# Patient Record
Sex: Male | Born: 1975 | Race: Black or African American | Hispanic: No | Marital: Married | Smoking: Never smoker
Health system: Southern US, Community
[De-identification: ages and names within clinical notes are randomized; demographics above are authoritative.]

## PROBLEM LIST (undated history)

## (undated) DIAGNOSIS — D649 Anemia, unspecified: Secondary | ICD-10-CM

## (undated) DIAGNOSIS — M21869 Other specified acquired deformities of unspecified lower leg: Secondary | ICD-10-CM

## (undated) DIAGNOSIS — I509 Heart failure, unspecified: Secondary | ICD-10-CM

## (undated) DIAGNOSIS — I129 Hypertensive chronic kidney disease with stage 1 through stage 4 chronic kidney disease, or unspecified chronic kidney disease: Secondary | ICD-10-CM

## (undated) DIAGNOSIS — N184 Chronic kidney disease, stage 4 (severe): Secondary | ICD-10-CM

## (undated) DIAGNOSIS — M205X9 Other deformities of toe(s) (acquired), unspecified foot: Secondary | ICD-10-CM

## (undated) DIAGNOSIS — I10 Essential (primary) hypertension: Secondary | ICD-10-CM

## (undated) DIAGNOSIS — M21969 Unspecified acquired deformity of unspecified lower leg: Secondary | ICD-10-CM

## (undated) DIAGNOSIS — E119 Type 2 diabetes mellitus without complications: Secondary | ICD-10-CM

## (undated) DIAGNOSIS — R7303 Prediabetes: Secondary | ICD-10-CM

## (undated) DIAGNOSIS — Z8739 Personal history of other diseases of the musculoskeletal system and connective tissue: Secondary | ICD-10-CM

## (undated) DIAGNOSIS — M1990 Unspecified osteoarthritis, unspecified site: Secondary | ICD-10-CM

## (undated) DIAGNOSIS — G43909 Migraine, unspecified, not intractable, without status migrainosus: Secondary | ICD-10-CM

## (undated) HISTORY — DX: Other specified acquired deformities of unspecified lower leg: M21.869

## (undated) HISTORY — DX: Unspecified acquired deformity of unspecified lower leg: M21.969

## (undated) HISTORY — DX: Other deformities of toe(s) (acquired), unspecified foot: M20.5X9

## (undated) HISTORY — PX: WISDOM TOOTH EXTRACTION: SHX21

---

## 1999-07-13 ENCOUNTER — Emergency Department (HOSPITAL_COMMUNITY): Admission: EM | Admit: 1999-07-13 | Discharge: 1999-07-13 | Payer: Self-pay | Admitting: Emergency Medicine

## 2011-09-11 ENCOUNTER — Inpatient Hospital Stay (HOSPITAL_COMMUNITY)
Admission: EM | Admit: 2011-09-11 | Discharge: 2011-09-14 | DRG: 682 | Disposition: A | Discharge status: Home / Self Care | Payer: Managed Care, Other (non HMO) | Attending: Internal Medicine | Admitting: Internal Medicine

## 2011-09-11 ENCOUNTER — Encounter (HOSPITAL_COMMUNITY): Payer: Self-pay | Admitting: Emergency Medicine

## 2011-09-11 ENCOUNTER — Emergency Department (HOSPITAL_COMMUNITY): Payer: Managed Care, Other (non HMO)

## 2011-09-11 DIAGNOSIS — E876 Hypokalemia: Secondary | ICD-10-CM

## 2011-09-11 DIAGNOSIS — N179 Acute kidney failure, unspecified: Secondary | ICD-10-CM | POA: Diagnosis present

## 2011-09-11 DIAGNOSIS — I120 Hypertensive chronic kidney disease with stage 5 chronic kidney disease or end stage renal disease: Secondary | ICD-10-CM

## 2011-09-11 DIAGNOSIS — M948X9 Other specified disorders of cartilage, unspecified sites: Secondary | ICD-10-CM | POA: Diagnosis present

## 2011-09-11 DIAGNOSIS — N183 Chronic kidney disease, stage 3 unspecified: Secondary | ICD-10-CM | POA: Diagnosis present

## 2011-09-11 DIAGNOSIS — I059 Rheumatic mitral valve disease, unspecified: Secondary | ICD-10-CM

## 2011-09-11 DIAGNOSIS — R042 Hemoptysis: Secondary | ICD-10-CM | POA: Diagnosis present

## 2011-09-11 DIAGNOSIS — Z992 Dependence on renal dialysis: Secondary | ICD-10-CM | POA: Diagnosis present

## 2011-09-11 DIAGNOSIS — N186 End stage renal disease: Secondary | ICD-10-CM | POA: Diagnosis present

## 2011-09-11 DIAGNOSIS — I160 Hypertensive urgency: Secondary | ICD-10-CM | POA: Diagnosis present

## 2011-09-11 DIAGNOSIS — I10 Essential (primary) hypertension: Secondary | ICD-10-CM

## 2011-09-11 DIAGNOSIS — D649 Anemia, unspecified: Secondary | ICD-10-CM | POA: Diagnosis present

## 2011-09-11 DIAGNOSIS — Z91199 Patient's noncompliance with other medical treatment and regimen due to unspecified reason: Secondary | ICD-10-CM

## 2011-09-11 DIAGNOSIS — D638 Anemia in other chronic diseases classified elsewhere: Secondary | ICD-10-CM | POA: Diagnosis present

## 2011-09-11 DIAGNOSIS — N184 Chronic kidney disease, stage 4 (severe): Secondary | ICD-10-CM | POA: Diagnosis present

## 2011-09-11 DIAGNOSIS — D509 Iron deficiency anemia, unspecified: Secondary | ICD-10-CM | POA: Diagnosis present

## 2011-09-11 DIAGNOSIS — R112 Nausea with vomiting, unspecified: Secondary | ICD-10-CM

## 2011-09-11 DIAGNOSIS — Z9119 Patient's noncompliance with other medical treatment and regimen: Secondary | ICD-10-CM

## 2011-09-11 DIAGNOSIS — I5043 Acute on chronic combined systolic (congestive) and diastolic (congestive) heart failure: Secondary | ICD-10-CM | POA: Diagnosis present

## 2011-09-11 DIAGNOSIS — I509 Heart failure, unspecified: Secondary | ICD-10-CM

## 2011-09-11 DIAGNOSIS — I129 Hypertensive chronic kidney disease with stage 1 through stage 4 chronic kidney disease, or unspecified chronic kidney disease: Principal | ICD-10-CM | POA: Diagnosis present

## 2011-09-11 DIAGNOSIS — R0602 Shortness of breath: Secondary | ICD-10-CM | POA: Diagnosis present

## 2011-09-11 HISTORY — DX: Hypertensive chronic kidney disease with stage 1 through stage 4 chronic kidney disease, or unspecified chronic kidney disease: I12.9

## 2011-09-11 HISTORY — DX: Essential (primary) hypertension: I10

## 2011-09-11 LAB — CBC
HCT: 36.5 % — ABNORMAL LOW (ref 39.0–52.0)
MCH: 29.5 pg (ref 26.0–34.0)
MCV: 84.9 fL (ref 78.0–100.0)
RBC: 4.3 MIL/uL (ref 4.22–5.81)
RDW: 14.5 % (ref 11.5–15.5)
WBC: 8.2 10*3/uL (ref 4.0–10.5)

## 2011-09-11 LAB — CARDIAC PANEL(CRET KIN+CKTOT+MB+TROPI)
Relative Index: 1.1 (ref 0.0–2.5)
Total CK: 681 U/L — ABNORMAL HIGH (ref 7–232)
Total CK: 489 U/L — ABNORMAL HIGH (ref 7–232)
Troponin I: <0.3 ng/mL (ref <0.30)

## 2011-09-11 LAB — DIFFERENTIAL
Eosinophils Relative: 2 % (ref 0–5)
Lymphocytes Relative: 13 % (ref 12–46)
Lymphs Abs: 1 10*3/uL (ref 0.7–4.0)
Monocytes Absolute: 0.4 10*3/uL (ref 0.1–1.0)

## 2011-09-11 LAB — MRSA PCR SCREENING: MRSA by PCR: NEGATIVE

## 2011-09-11 LAB — POCT I-STAT, CHEM 8
BUN: 21 mg/dL (ref 6–23)
Calcium, Ion: 1.1 mmol/L — ABNORMAL LOW (ref 1.12–1.32)
Chloride: 104 mEq/L (ref 96–112)
Glucose, Bld: 111 mg/dL — ABNORMAL HIGH (ref 70–99)

## 2011-09-11 LAB — URINALYSIS, MICROSCOPIC ONLY
Glucose, UA: NEGATIVE mg/dL
Ketones, ur: NEGATIVE mg/dL
Protein, ur: >300 mg/dL — AB

## 2011-09-11 LAB — MICROALBUMIN / CREATININE URINE RATIO
Creatinine, Urine: 92.9 mg/dL
Microalb, Ur: 129.85 mg/dL — ABNORMAL HIGH (ref 0.00–1.89)

## 2011-09-11 LAB — PROTEIN / CREATININE RATIO, URINE
Protein Creatinine Ratio: 1.2 — ABNORMAL HIGH (ref 0.00–0.15)
Total Protein, Urine: 76.4 mg/dL

## 2011-09-11 LAB — RAPID URINE DRUG SCREEN, HOSP PERFORMED: Benzodiazepines: NOT DETECTED

## 2011-09-11 MED ORDER — ACETAMINOPHEN 325 MG PO TABS: 650.0000 mg | ORAL_TABLET | Freq: Four times a day (QID) | ORAL | Status: DC | PRN

## 2011-09-11 MED ORDER — FUROSEMIDE 10 MG/ML IJ SOLN
80.0000 mg | Freq: Four times a day (QID) | INTRAMUSCULAR | Status: DC
  Administered 2011-09-11 (×2): 80 mg via INTRAVENOUS
  Filled 2011-09-11 (×5): qty 8

## 2011-09-11 MED ORDER — LABETALOL HCL 5 MG/ML IV SOLN: 10.0000 mg | INTRAVENOUS | Status: DC | PRN

## 2011-09-11 MED ORDER — SODIUM CHLORIDE 0.9 % IV SOLN: INTRAVENOUS | Status: DC

## 2011-09-11 MED ORDER — HYDRALAZINE HCL 50 MG PO TABS: 100.0000 mg | ORAL_TABLET | Freq: Four times a day (QID) | ORAL | Status: DC

## 2011-09-11 MED ORDER — SODIUM CHLORIDE 0.9 % IV SOLN
INTRAVENOUS | Status: DC
  Administered 2011-09-11: 13:00:00 via INTRAVENOUS

## 2011-09-11 MED ORDER — ASPIRIN EC 81 MG PO TBEC
81.0000 mg | DELAYED_RELEASE_TABLET | Freq: Every day | ORAL | Status: DC
  Administered 2011-09-11 – 2011-09-14 (×4): 81 mg via ORAL
  Filled 2011-09-11 (×4): qty 1

## 2011-09-11 MED ORDER — ONDANSETRON HCL 4 MG PO TABS: 4.0000 mg | ORAL_TABLET | Freq: Four times a day (QID) | ORAL | Status: DC | PRN

## 2011-09-11 MED ORDER — ASPIRIN 81 MG PO CHEW
CHEWABLE_TABLET | ORAL | Status: AC
  Filled 2011-09-11: qty 1

## 2011-09-11 MED ORDER — SPIRONOLACTONE 50 MG PO TABS
50.0000 mg | ORAL_TABLET | Freq: Two times a day (BID) | ORAL | Status: DC
  Administered 2011-09-11 – 2011-09-14 (×7): 50 mg via ORAL
  Filled 2011-09-11 (×11): qty 1

## 2011-09-11 MED ORDER — HYDRALAZINE HCL 50 MG PO TABS
50.0000 mg | ORAL_TABLET | Freq: Four times a day (QID) | ORAL | Status: DC
  Filled 2011-09-11 (×3): qty 1

## 2011-09-11 MED ORDER — CARVEDILOL 12.5 MG PO TABS
12.5000 mg | ORAL_TABLET | Freq: Two times a day (BID) | ORAL | Status: DC
  Administered 2011-09-11 – 2011-09-12 (×2): 12.5 mg via ORAL
  Filled 2011-09-11 (×4): qty 1

## 2011-09-11 MED ORDER — ACETAMINOPHEN 650 MG RE SUPP: 650.0000 mg | Freq: Four times a day (QID) | RECTAL | Status: DC | PRN

## 2011-09-11 MED ORDER — ONDANSETRON HCL 4 MG/2ML IJ SOLN: 4.0000 mg | Freq: Four times a day (QID) | INTRAMUSCULAR | Status: DC | PRN

## 2011-09-11 MED ORDER — NITROGLYCERIN IN D5W 200-5 MCG/ML-% IV SOLN
5.0000 ug/min | Freq: Once | INTRAVENOUS | Status: AC
  Administered 2011-09-11: 5 ug/min via INTRAVENOUS
  Filled 2011-09-11: qty 250

## 2011-09-11 MED ORDER — LABETALOL HCL 5 MG/ML IV SOLN
20.0000 mg | Freq: Once | INTRAVENOUS | Status: AC
  Administered 2011-09-11: 20 mg via INTRAVENOUS
  Filled 2011-09-11: qty 4

## 2011-09-11 MED ORDER — MORPHINE SULFATE 2 MG/ML IJ SOLN
1.0000 mg | INTRAMUSCULAR | Status: DC | PRN
  Administered 2011-09-11 (×2): 1 mg via INTRAVENOUS
  Filled 2011-09-11 (×2): qty 1

## 2011-09-11 MED ORDER — NITROGLYCERIN IN D5W 200-5 MCG/ML-% IV SOLN
2.0000 ug/min | INTRAVENOUS | Status: DC
  Administered 2011-09-12: 5 ug/min via INTRAVENOUS

## 2011-09-11 MED ORDER — SODIUM CHLORIDE 0.9 % IJ SOLN
3.0000 mL | Freq: Two times a day (BID) | INTRAMUSCULAR | Status: DC
  Administered 2011-09-11 – 2011-09-14 (×7): 3 mL via INTRAVENOUS

## 2011-09-11 MED ORDER — AMLODIPINE BESYLATE 10 MG PO TABS
10.0000 mg | ORAL_TABLET | Freq: Every day | ORAL | Status: DC
  Administered 2011-09-11 – 2011-09-13 (×3): 10 mg via ORAL
  Filled 2011-09-11 (×4): qty 1

## 2011-09-11 MED ORDER — HYDRALAZINE HCL 25 MG PO TABS
25.0000 mg | ORAL_TABLET | Freq: Four times a day (QID) | ORAL | Status: DC
  Administered 2011-09-11 (×2): 25 mg via ORAL
  Filled 2011-09-11 (×7): qty 1

## 2011-09-11 MED ORDER — FUROSEMIDE 80 MG PO TABS
80.0000 mg | ORAL_TABLET | Freq: Two times a day (BID) | ORAL | Status: DC
  Administered 2011-09-11 – 2011-09-12 (×2): 80 mg via ORAL
  Filled 2011-09-11 (×4): qty 1

## 2011-09-11 MED ORDER — HYDRALAZINE HCL 50 MG PO TABS
50.0000 mg | ORAL_TABLET | Freq: Two times a day (BID) | ORAL | Status: DC
  Administered 2011-09-11 – 2011-09-12 (×2): 50 mg via ORAL
  Filled 2011-09-11 (×3): qty 1

## 2011-09-11 MED ORDER — HYDROCODONE-ACETAMINOPHEN 5-325 MG PO TABS
1.0000 | ORAL_TABLET | ORAL | Status: DC | PRN
  Administered 2011-09-11 (×2): 2 via ORAL
  Administered 2011-09-11: 1 via ORAL
  Administered 2011-09-11 – 2011-09-13 (×3): 2 via ORAL
  Filled 2011-09-11 (×4): qty 2
  Filled 2011-09-11: qty 1
  Filled 2011-09-11 (×2): qty 2

## 2011-09-11 MED ORDER — POTASSIUM CHLORIDE CRYS ER 20 MEQ PO TBCR
40.0000 meq | EXTENDED_RELEASE_TABLET | Freq: Once | ORAL | Status: AC
  Administered 2011-09-11: 40 meq via ORAL
  Filled 2011-09-11: qty 2

## 2011-09-11 MED ORDER — POTASSIUM CHLORIDE IN NACL 20-0.45 MEQ/L-% IV SOLN
INTRAVENOUS | Status: DC
  Filled 2011-09-11 (×2): qty 1000

## 2011-09-11 NOTE — ED Provider Notes (Signed)
Medical screening examination/treatment/procedure(s) were performed by non-physician practitioner and as supervising physician I was immediately available for consultation/collaboration.  Lamiya Naas M Avaleigh Decuir, MD 09/11/11 0656 

## 2011-09-11 NOTE — H&P (Signed)
Barry Nichols is an 36 y.o. male.   Chief Complaint: Shortness of Breath HPI: A 36 yo man with long standing history of Hypertension who has not been taking his medications because he believes it is not working, he presented to the ED today with exertional dyspnea and Blood pressure of 205/134. He denies chest pain but has noted that his breathing gets worse with movement and exertion. No dizziness, no Headaches, no fever, no NVD. He has no primary care physician. He was found to have hypokalemia and evidence of Renal insufficiency but denies known history of Chronic kidney disease.  Past Medical History  Diagnosis Date  . Hypertension     History reviewed. No pertinent past surgical history.  History reviewed. No pertinent family history. Social History:  reports that he has quit smoking. He does not have any smokeless tobacco history on file. He reports that he does not drink alcohol or use illicit drugs.  Allergies: No Known Allergies   (Not in a hospital admission)  Results for orders placed during the hospital encounter of 09/11/11 (from the past 48 hour(s))  CBC     Status: Abnormal   Collection Time   09/11/11  3:57 AM      Component Value Range Comment   WBC 8.2  4.0 - 10.5 (K/uL)    RBC 4.30  4.22 - 5.81 (MIL/uL)    Hemoglobin 12.7 (*) 13.0 - 17.0 (g/dL)    HCT 16.1 (*) 09.6 - 52.0 (%)    MCV 84.9  78.0 - 100.0 (fL)    MCH 29.5  26.0 - 34.0 (pg)    MCHC 34.8  30.0 - 36.0 (g/dL)    RDW 04.5  40.9 - 81.1 (%)    Platelets 233  150 - 400 (K/uL)   DIFFERENTIAL     Status: Abnormal   Collection Time   09/11/11  3:57 AM      Component Value Range Comment   Neutrophils Relative 80 (*) 43 - 77 (%)    Neutro Abs 6.6  1.7 - 7.7 (K/uL)    Lymphocytes Relative 13  12 - 46 (%)    Lymphs Abs 1.0  0.7 - 4.0 (K/uL)    Monocytes Relative 4  3 - 12 (%)    Monocytes Absolute 0.4  0.1 - 1.0 (K/uL)    Eosinophils Relative 2  0 - 5 (%)    Eosinophils Absolute 0.2  0.0 - 0.7 (K/uL)      Basophils Relative 0  0 - 1 (%)    Basophils Absolute 0.0  0.0 - 0.1 (K/uL)   PRO B NATRIURETIC PEPTIDE     Status: Abnormal   Collection Time   09/11/11  4:04 AM      Component Value Range Comment   Pro B Natriuretic peptide (BNP) 5642.0 (*) 0 - 125 (pg/mL)   POCT I-STAT, CHEM 8     Status: Abnormal   Collection Time   09/11/11  4:04 AM      Component Value Range Comment   Sodium 143  135 - 145 (mEq/L)    Potassium 3.2 (*) 3.5 - 5.1 (mEq/L)    Chloride 104  96 - 112 (mEq/L)    BUN 21  6 - 23 (mg/dL)    Creatinine, Ser 9.14 (*) 0.50 - 1.35 (mg/dL)    Glucose, Bld 782 (*) 70 - 99 (mg/dL)    Calcium, Ion 9.56 (*) 1.12 - 1.32 (mmol/L)    TCO2 26  0 -  100 (mmol/L)    Hemoglobin 12.6 (*) 13.0 - 17.0 (g/dL)    HCT 03.4 (*) 74.2 - 52.0 (%)   POCT I-STAT TROPONIN I     Status: Normal   Collection Time   09/11/11  5:16 AM      Component Value Range Comment   Troponin i, poc 0.06  0.00 - 0.08 (ng/mL)    Comment 3             Dg Chest 2 View  09/11/2011  *RADIOLOGY REPORT*  Clinical Data: Shortness of breath, cough.  CHEST - 2 VIEW  Comparison: None.  Findings: Cardiomegaly.  Central vascular congestion.  Interstitial prominence.  No pneumothorax.  No pleural effusion.  No acute osseous abnormality.  IMPRESSION: Cardiomegaly with central vascular congestion.  Interstitial prominence may reflect edema or atypical pneumonia.  Original Report Authenticated By: Waneta Martins, M.D.    Review of Systems  Eyes: Positive for blurred vision.  Respiratory: Positive for shortness of breath. Negative for sputum production and wheezing.   Cardiovascular: Negative.   Gastrointestinal: Negative.   Genitourinary: Negative.   Musculoskeletal: Negative.   Skin: Negative.   Neurological: Positive for weakness and headaches.  Endo/Heme/Allergies: Negative.   Psychiatric/Behavioral: Negative.     Blood pressure 215/141, pulse 93, temperature 98.5 F (36.9 C), temperature source Oral, resp. rate  26, SpO2 97.00%. Physical Exam  Constitutional: He is oriented to person, place, and time. He appears well-developed and well-nourished.  HENT:  Head: Normocephalic and atraumatic.  Right Ear: External ear normal.  Left Ear: External ear normal.  Nose: Nose normal.  Mouth/Throat: Oropharynx is clear and moist.  Eyes: Conjunctivae and EOM are normal. Pupils are equal, round, and reactive to light.  Neck: Normal range of motion. Neck supple.  Cardiovascular: Normal rate, regular rhythm, normal heart sounds and intact distal pulses.   Respiratory: Effort normal and breath sounds normal.  GI: Soft. Bowel sounds are normal.  Musculoskeletal: Normal range of motion.  Neurological: He is alert and oriented to person, place, and time. He has normal reflexes.  Skin: Skin is warm and dry.  Psychiatric: He has a normal mood and affect. His behavior is normal. Judgment and thought content normal.     Assessment/Plan A 35 Yo man presenting with HTN urgency due to Non-compliance with treatment. Also Hypokalemia, Renal insufficiency and Anemia probably of chronic disease. Plan#1 Hypertensive Urgency: Patient will be admitted to [facility] unit, start on Nitroglycerin drip,aspirin check Echocardiogram and start oral medications to titrate him off the drip. #2 Hypokalemia: Check Magnesium level and replete. May be Renal loss since no Gi complaints. #3 Renal Insufficiency: Probably chronic from HTN renal disease. Hydrate, follow BUN/Creatinine #4 Anemia of Chronic Disease: Follow H/H closely #5 Medication Non-Compliance: Counseling provided.    January Bergthold,LAWAL 09/11/2011, 5:51 AM

## 2011-09-11 NOTE — ED Notes (Signed)
Pt c/o sob with lying x 3 days. States he had one episode of coughing pink tinged sputum last night. Pt hypertensive. States he is supposed to take medications at home but does not. On monitor. NP at bedside to discuss plan of care

## 2011-09-11 NOTE — ED Notes (Signed)
Attempted to call report but receiving RN is unavailable. 

## 2011-09-11 NOTE — ED Notes (Signed)
Patient with shortness of breath, increasing in the last day, patient states that when he lays down he hears a gurgling and has a dry cough.

## 2011-09-11 NOTE — ED Notes (Signed)
BP remains to be elevated at 200/137. NTG drip was increased to 20 mcg/min. Will continue to monitor

## 2011-09-11 NOTE — ED Notes (Signed)
Dr. Butler Denmark at the bedside to examine the patient.

## 2011-09-11 NOTE — ED Provider Notes (Signed)
History     CSN: [id number]  Arrival date & time 09/11/11  4782   First MD Initiated Contact with Patient 09/11/11 [phone]      Chief Complaint  Patient presents with  . Shortness of Breath    (Consider location/radiation/quality/duration/timing/severity/associated sxs/prior treatment) HPI Comments: Gentleman with a history of hypertension.  He states that he is been on medication in the past without any relief, taking medicine.  Several years ago for the last while he has noticed, that when he lays flat.  He has a gurgling sensation in his chest and feels short of breath.  You to his allergies of note.  He is taking pseudoephedrine 30 mg tablets every 4 hours, which may be contributing to his high blood pressure  Patient is a 36 y.o. male presenting with shortness of breath. The history is provided by the patient.  Shortness of Breath  The current episode started 2 days ago. The onset was gradual. The problem occurs continuously. The problem has been gradually worsening. The problem is moderate. The symptoms are aggravated by activity and a supine position. Associated symptoms include shortness of breath. Pertinent negatives include no chest pain, no chest pressure and no fever.    Past Medical History  Diagnosis Date  . Hypertension     History reviewed. No pertinent past surgical history.  History reviewed. No pertinent family history.  History  Substance Use Topics  . Smoking status: Former Games developer  . Smokeless tobacco: Not on file  . Alcohol Use: No      Review of Systems  Constitutional: Negative for fever.  HENT: Positive for congestion.   Respiratory: Positive for chest tightness and shortness of breath.   Cardiovascular: Negative for chest pain.  Skin: Negative for rash.  Neurological: Negative for dizziness and headaches.    Allergies  Review of patient's allergies indicates no known allergies.  Home Medications   Current Outpatient Rx  Name Route Sig  Dispense Refill  . CETIRIZINE HCL 10 MG PO TABS Oral Take 10 mg by mouth daily.    Marland Kitchen PRESCRIPTION MEDICATION Inhalation Inhale 2 puffs into the lungs daily as needed. inhaler    . PSEUDOEPHEDRINE HCL 30 MG PO TABS Oral Take 30 mg by mouth every 4 (four) hours as needed. For allergies      BP 226/138  Pulse 54  Temp(Src) 98.5 F (36.9 C) (Oral)  Resp 24  SpO2 98%  Physical Exam  Constitutional: He is oriented to person, place, and time. He appears well-developed and well-nourished.  HENT:  Head: Normocephalic.  Eyes: Pupils are equal, round, and reactive to light.  Neck: Normal range of motion.  Cardiovascular: Normal rate and regular rhythm.   Pulmonary/Chest: Effort normal. No respiratory distress. He has wheezes. He exhibits no tenderness.  Abdominal: He exhibits no distension.  Musculoskeletal: Normal range of motion. He exhibits no edema.  Neurological: He is alert and oriented to person, place, and time.  Skin: Skin is warm and dry.    ED Course  Procedures (including critical care time)  Labs Reviewed  CBC - Abnormal; Notable for the following:    Hemoglobin 12.7 (*)    HCT 36.5 (*)    All other components within normal limits  DIFFERENTIAL - Abnormal; Notable for the following:    Neutrophils Relative 80 (*)    All other components within normal limits  PRO B NATRIURETIC PEPTIDE - Abnormal; Notable for the following:    Pro B Natriuretic peptide (BNP) 5642.0 (*)  All other components within normal limits  POCT I-STAT, CHEM 8 - Abnormal; Notable for the following:    Potassium 3.2 (*)    Creatinine, Ser 2.70 (*)    Glucose, Bld 111 (*)    Calcium, Ion 1.10 (*)    Hemoglobin 12.6 (*)    HCT 37.0 (*)    All other components within normal limits   Dg Chest 2 View  09/11/2011  *RADIOLOGY REPORT*  Clinical Data: Shortness of breath, cough.  CHEST - 2 VIEW  Comparison: None.  Findings: Cardiomegaly.  Central vascular congestion.  Interstitial prominence.  No  pneumothorax.  No pleural effusion.  No acute osseous abnormality.  IMPRESSION: Cardiomegaly with central vascular congestion.  Interstitial prominence may reflect edema or atypical pneumonia.  Original Report Authenticated By: Waneta Martins, M.D.     No diagnosis found.  ED ECG REPORT   Date: 09/11/2011  EKG Time: 5:10 AM  Rate: 109  Rhythm: sinus tachycardia,  , there are no previous tracings available for comparison  Axis: normal  Intervals:none  ST&T Change: St an dT wave abnormal   Narrative Interpretation: abnormal            MDM          Arman Filter, NP 09/11/11 [phone]

## 2011-09-11 NOTE — Progress Notes (Signed)
[facility]  Pt admitted near 6 AM.  Pt evaluated and chart reviewed. Clearly has pulmonary edema from chronic HTN and likely diastolic dysfunction. Have started Lasix, Hydralazine and Aldactone. BP is improving and he has diuresed about 1.5 liters. F/u ECHO, KCL later today and cardiac enzymes.  Appreciate nephro f/u.   Calvert Cantor, MD [phone]

## 2011-09-11 NOTE — Consult Note (Signed)
[facility] - CONSULT NOTE Resident Note    Please see below for attending addendum to resident note.   Date: 09/11/2011                  Patient Name:  Barry Nichols  MRN: [id number]  DOB: [birth year]  Age / Sex: 36 y.o., male         PCP: Pcp Not In System                 Referring Physician: Dr. Butler Denmark                 Reason for Consult: Hypertensive emergency            History of Present Illness: Patient is a 36 y.o. male with a PMHx of long standing history of uncontrolled HTN, transient severe HTN in 2009 and 2012 ( DBP>160), medication noncompliance ( has not been taking his medications for last 1 month because he believes that they do not work), who presented to [facility] on 09/11/2011 with hemoptysis and SOB.  He states that he started coughing up some blood last night- small amounts with phlegm which persisted in the morning and he decided to come to the ER. Denies noticing any clots. He also reports having SOB for last 2 weeks that is worse with laying flat. Denies any dyspnea on exertion to [place] but was documented in the ED note. Denies any chest pain, wheezing, dizziness, blurry vision, weakness , numbness or leg swelling. Also denies any N/V/D.  Of note he stopped taking his BP meds fro last 1 month when he came back from a trip to [place] as his BP was running in 150/90 by his report.  In the ER he was found to have BP of 250/165 and Cr- 2.7, K- 3.2. Patient was unable to tell his baseline Cr and therefore renal was consulted for his BP management and to help providing records eluding his baseline renal function.   He was initially evaluated at [facility] in April 2012 because of severe hypertension and a creatinine one 2.5 - 2.6.  (He had been seen prior to that by Dr. Armanda Magic who had initiated a workup for secondary hypertension that the patient never completed, other than a renal artery duplex that showed no evidence for renal artery stenosis).   Evaluation at [facility] included renal function as mentioned, 24 hour creatinine clearance of 70.7 ml/min, around 600 mg protein, negative serologies (ANCA, ANA, SPEP, UPEP, ANA and complements that were completely negative or normal)  He did return for followup on at least 2 occasions with his last visit in 01/13 when his BP was 180/110 with Cr of 2.38. His BP was noted in 160's diastolics in 03/12 and was 220/110 in 04/12.  He never took his BP meds prior to his office visits and did not bring in his monitor for review of results to any visit.  His wife states they did stop using salt for cooking.  He was recently taking sudafed for allergies.  He is not using cocaine. He had no BP meds for a month PTA  Medications: Outpatient medications: Prescriptions prior to admission  Medication Sig Dispense Refill  . cetirizine (ZYRTEC) 10 MG tablet Take 10 mg by mouth daily.      Marland Kitchen PRESCRIPTION MEDICATION Inhale 2 puffs into the lungs daily as needed. inhaler      . pseudoephedrine (SUDAFED) 30 MG tablet Take 30 mg by mouth every  4 (four) hours as needed. For allergies        Current medications: Current Facility-Administered Medications  Medication Dose Route Frequency Provider Last Rate Last Dose  . 0.9 %  sodium chloride infusion   Intravenous Continuous Calvert Cantor, MD      . acetaminophen (TYLENOL) tablet 650 mg  650 mg Oral Q6H PRN Rometta Emery, MD       Or  . acetaminophen (TYLENOL) suppository 650 mg  650 mg Rectal Q6H PRN Rometta Emery, MD      . aspirin EC tablet 81 mg  81 mg Oral Daily Rometta Emery, MD      . furosemide (LASIX) injection 80 mg  80 mg Intravenous Q6H Calvert Cantor, MD   80 mg at 09/11/11 0830  . hydrALAZINE (APRESOLINE) tablet 25 mg  25 mg Oral Q6H Russella Dar, NP   25 mg at 09/11/11 0804  . HYDROcodone-acetaminophen (NORCO) 5-325 MG per tablet 1-2 tablet  1-2 tablet Oral Q4H PRN Rometta Emery, MD   2 tablet at 09/11/11 1306  . labetalol (NORMODYNE,TRANDATE)  injection 20 mg  20 mg Intravenous Once Arman Filter, NP   20 mg at 09/11/11 0455  . labetalol (NORMODYNE,TRANDATE) injection 20 mg  20 mg Intravenous Once Olivia Mackie, MD   20 mg at 09/11/11 0548  . morphine 2 MG/ML injection 1 mg  1 mg Intravenous Q4H PRN Rometta Emery, MD   1 mg at 09/11/11 1306  . nitroGLYCERIN 0.2 mg/mL in dextrose 5 % infusion  5 mcg/min Intravenous Once Arman Filter, NP 9 mL/hr at 09/11/11 1203 30 mcg/min at 09/11/11 1203  . nitroGLYCERIN 0.2 mg/mL in dextrose 5 % infusion  2-200 mcg/min Intravenous Titrated Rometta Emery, MD 12 mL/hr at 09/11/11 1305 40 mcg/min at 09/11/11 1305  . ondansetron (ZOFRAN) tablet 4 mg  4 mg Oral Q6H PRN Rometta Emery, MD       Or  . ondansetron (ZOFRAN) injection 4 mg  4 mg Intravenous Q6H PRN Rometta Emery, MD      . potassium chloride SA (K-DUR,KLOR-CON) CR tablet 40 mEq  40 mEq Oral Once Arman Filter, NP   40 mEq at 09/11/11 0515  . sodium chloride 0.9 % injection 3 mL  3 mL Intravenous Q12H Rometta Emery, MD      . spironolactone (ALDACTONE) tablet 50 mg  50 mg Oral BID Calvert Cantor, MD   50 mg at 09/11/11 0901  . DISCONTD: 0.45 % NaCl with KCl 20 mEq / L infusion   Intravenous Continuous Rometta Emery, MD      . DISCONTD: aspirin 81 MG chewable tablet             Allergies: No Known Allergies   Past Medical History: Past Medical History  Diagnosis Date  . Hypertension    Past Surgical History: History reviewed. No pertinent past surgical history.  Family History: Positive for hypertension, diabetes, MI, lung cancer.  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 rate 18, height 5\' 10"  (1.778 m), weight 236 lb 8.9 oz (107.3 kg), SpO2 95.00%.  Weight trends: Filed Weights    09/11/11 1300  Weight: 236 lb 8.9 oz (107.3 kg)  Physical Exam: General: Vital signs reviewed and noted. Well-developed, well-nourished, in no acute distress; alert, appropriate and cooperative throughout examination.  Head: Normocephalic, atraumatic.  Eyes: PERRL, EOMI, No signs of anemia or jaundince.  Nose: Mucous membranes moist, not inflammed, nonerythematous.  Throat: Oropharynx nonerythematous, no exudate appreciated.   Neck: No deformities, masses, or tenderness noted.Supple, No carotid Bruits, no JVD.  Lungs:  Normal respiratory effort. Clear to auscultation BL with bilateral base crackles.   Heart: RRR. S1 and S2 normal without gallop, murmur, or rubs.  Abdomen:  BS normoactive. Soft, Nondistended, non-tender.  No masses or organomegaly.  Extremities: 1+ pretibial edema.  Neurologic: A&O X3, CN II - XII are grossly intact. Motor strength is 5/5 in the all 4 extremities, Sensations intact to light touch, Cerebellar signs negative.  Skin: No visible rashes, scars. Tattoos on back/arms    Lab results: Basic Metabolic Panel:  Lab 09/11/11 4540  NA 143  K 3.2*  CL 104  CO2 --  GLUCOSE 111*  BUN 21  CREATININE 2.70*  CALCIUM --  MG --  PHOS --   CBC:  Lab 09/11/11 0404 09/11/11 0357  WBC -- 8.2  NEUTROABS -- 6.6  HGB 12.6* 12.7*  HCT 37.0* 36.5*  MCV -- 84.9  PLT -- 233     Urinalysis:  Basename 09/11/11 0843  COLORURINE YELLOW  LABSPEC 1.013  PHURINE 7.0  GLUCOSEU NEGATIVE  HGBUR SMALL*  BILIRUBINUR NEGATIVE  KETONESUR NEGATIVE  PROTEINUR >300*  UROBILINOGEN 0.2  NITRITE NEGATIVE  LEUKOCYTESUR NEGATIVE      Imaging: Dg Chest 2 View  09/11/2011  *RADIOLOGY REPORT*  Clinical Data: Shortness of breath, cough.  CHEST - 2 VIEW  Comparison: None.  Findings: Cardiomegaly.  Central vascular congestion.  Interstitial prominence.  No pneumothorax.  No pleural effusion.  No acute osseous abnormality.  IMPRESSION: Cardiomegaly with central vascular  congestion.  Interstitial prominence may reflect edema or atypical pneumonia.  Original Report Authenticated By: Waneta Martins, M.D.     Renal ultrasound in 03/12: R kidney measures- 10 cm, left-9.1cm. No renal artery stenosis. Some increased in echogenicity of the renal parenchyma.   Echocardiogram in (?)2012 -  [facility].  EKG- Normal sinus rhythm, tachycardia, ST-T- wave changes in leads I, aVL, II,III, aVF, V4, V5 and  V6, [facility].  Assessment & Plan: Pt is a 37 y.o. yo male with a PMHX of long standing of poorly  HTN in the setting of non - compliance , was admitted to [facility] on 09/11/2011 with SOB and hemotysis. He has long standing history of uncontrolled HTN and presented with BP of 250 /165 and Cr of 2.7. He follows up with Dr. Eliott Nine as an outpatient and renal was consulted to assist in the management of his Hypertensive emergency and assist in getting records to know his baseline Cr.   1.Hypertensive emergency: BP on admission was 250/150 with flash pulmonary edema on CXR and inferolateral ST - T changes on his EKG with some acute on chronic renal failure in the setting of medication non compliance. His Cr is 2.7 with baseline ~2.5 and POC troponin's have been negative We reviewed medical records from [facility]- Dr. Elza Rafter office notes and he does have severe hypertension. Records reveal BP ranging 220-260/110-140 at baseline (with occasional lower values per patient's report).He has a history of medication non-compliance since his BP did not improve as medications were being titrated up; therefore, he just stopped taking all of medications completely about 1 month ago.  He had renal duplex in 06/2010 which did not any renal artery stenosis; however, his kidneys were small for his size, 10 & 9cm right and left respectively and he had a 24 hr ccr of 70 with about 600 mg proteinuria.t was thought that renal bx would be low yield.  Patient was supposed to get work-up labs for pheo and  other secondary causes; however, he never came for his appointment with Dr. Myrna Blazer PCP. Urinalysis today shows >300 protein, no RBC. So we would rule out secondary causes for his HTN like phaeochromocytoma, hyperaldosteronism.  -Recommend not to lower his BP rapidly not over 25% of his admission BP in 1st 24 hours to avoid CVAs, especially THIS IS close to his baseline BP (would keep BP close to 190/100), decrease Lasix dose. -Will check VMA, 24 h urine fractionated metanephrines, plasma metanepherines, dopamine, aldosterone/renin  , urinary free cortisol  -Will repeat protein/cr (would not be surprised to see increased proteinuria in the settingof accelerated HTN - Will need to find a regimen for him that is relatively streamlined (no more than BID meds if possible) and affordable - as this has been issue with compliance in past - Will change Lasix to 80 PO bid, add carvedilol and amlodipine, change hydralazine to BID - creatinine will probably rise as BP is lowered  2. 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 His Cr  was 2.38 in 01/13. He presents with Cr of 2.7 today. Suspect this is progression of his CKD in the setting of poorly controlled HTN. He has UOP of 1.8 litres so far. UA shows proteinuria, small amount of blood and no RBC. Likely will worsen with BP control    3. Anemia : He presented with Hb- 12.6. He had Hb- 15 in 01/13. Continue to monitor CBC.  4. Hypokalemia : He presents with potassium of 3.2. Etiology like related to high renin state of accelerated HTN (has never been hypokalemic in the past) but will check Mg, renin- aldosterone to rule out secondary causes of HTN.  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  6.Dispo- Continue to monitor in ICU.  Patient history and plan of care reviewed with attending, Dr. Sharlee Blew, MD  PGYII, Internal Medicine Resident 09/11/2011, 2:11 PM I have seen and examined this patient and agree with plan as  outlined above.  36 yo well known to me with (probable) essential hypertension - never well controlled - with evidence of end organ damage (LVH, cardiomegaly, retinopathy, CKD) who presents with accelerated HTN after being off all meds for a month.  Plan for medication changes and workup as outlined.   Derika Eckles B,MD 09/11/2011 4:49 PM

## 2011-09-12 ENCOUNTER — Encounter (HOSPITAL_COMMUNITY): Payer: Self-pay | Admitting: Nephrology

## 2011-09-12 DIAGNOSIS — E876 Hypokalemia: Secondary | ICD-10-CM

## 2011-09-12 DIAGNOSIS — I5021 Acute systolic (congestive) heart failure: Secondary | ICD-10-CM

## 2011-09-12 DIAGNOSIS — I120 Hypertensive chronic kidney disease with stage 5 chronic kidney disease or end stage renal disease: Secondary | ICD-10-CM

## 2011-09-12 DIAGNOSIS — I10 Essential (primary) hypertension: Secondary | ICD-10-CM

## 2011-09-12 LAB — COMPREHENSIVE METABOLIC PANEL
ALT: 20 U/L (ref 0–53)
Alkaline Phosphatase: 77 U/L (ref 39–117)
BUN: 23 mg/dL (ref 6–23)
Chloride: 100 mEq/L (ref 96–112)
GFR calc Af Amer: 33 mL/min — ABNORMAL LOW (ref >90)
Glucose, Bld: 102 mg/dL — ABNORMAL HIGH (ref 70–99)
Potassium: 3 mEq/L — ABNORMAL LOW (ref 3.5–5.1)
Sodium: 138 mEq/L (ref 135–145)
Total Bilirubin: 0.7 mg/dL (ref 0.3–1.2)

## 2011-09-12 LAB — CBC
MCHC: 34.8 g/dL (ref 30.0–36.0)
MCV: 85.1 fL (ref 78.0–100.0)
Platelets: 238 10*3/uL (ref 150–400)
RDW: 14.3 % (ref 11.5–15.5)
WBC: 8.8 10*3/uL (ref 4.0–10.5)

## 2011-09-12 MED ORDER — POTASSIUM CHLORIDE CRYS ER 20 MEQ PO TBCR
40.0000 meq | EXTENDED_RELEASE_TABLET | Freq: Every day | ORAL | Status: DC
  Administered 2011-09-12 – 2011-09-14 (×3): 40 meq via ORAL
  Filled 2011-09-12 (×3): qty 2

## 2011-09-12 MED ORDER — CARVEDILOL 25 MG PO TABS
25.0000 mg | ORAL_TABLET | Freq: Two times a day (BID) | ORAL | Status: DC
  Administered 2011-09-12 – 2011-09-14 (×4): 25 mg via ORAL
  Filled 2011-09-12 (×6): qty 1

## 2011-09-12 MED ORDER — HYDRALAZINE HCL 50 MG PO TABS: 50.0000 mg | ORAL_TABLET | Freq: Four times a day (QID) | ORAL | Status: DC

## 2011-09-12 MED ORDER — CARVEDILOL 12.5 MG PO TABS
12.5000 mg | ORAL_TABLET | ORAL | Status: AC
  Administered 2011-09-12: 12.5 mg via ORAL
  Filled 2011-09-12: qty 1

## 2011-09-12 MED ORDER — HYDRALAZINE HCL 50 MG PO TABS
50.0000 mg | ORAL_TABLET | Freq: Three times a day (TID) | ORAL | Status: DC
  Filled 2011-09-12 (×3): qty 1

## 2011-09-12 MED ORDER — POTASSIUM CHLORIDE CRYS ER 20 MEQ PO TBCR
40.0000 meq | EXTENDED_RELEASE_TABLET | Freq: Four times a day (QID) | ORAL | Status: AC
  Administered 2011-09-12 (×2): 40 meq via ORAL
  Filled 2011-09-12 (×2): qty 2

## 2011-09-12 MED ORDER — FUROSEMIDE 40 MG PO TABS
40.0000 mg | ORAL_TABLET | Freq: Two times a day (BID) | ORAL | Status: DC
  Administered 2011-09-12 – 2011-09-14 (×4): 40 mg via ORAL
  Filled 2011-09-12 (×6): qty 1

## 2011-09-12 MED ORDER — HYDRALAZINE HCL 50 MG PO TABS
50.0000 mg | ORAL_TABLET | Freq: Two times a day (BID) | ORAL | Status: DC
  Administered 2011-09-12 – 2011-09-13 (×3): 50 mg via ORAL
  Filled 2011-09-12 (×4): qty 1

## 2011-09-12 MED ORDER — POTASSIUM CHLORIDE CRYS ER 20 MEQ PO TBCR: 40.0000 meq | EXTENDED_RELEASE_TABLET | Freq: Four times a day (QID) | ORAL | Status: DC

## 2011-09-12 MED ORDER — NITROGLYCERIN 0.4 MG/HR TD PT24
0.4000 mg | MEDICATED_PATCH | Freq: Every day | TRANSDERMAL | Status: DC
  Administered 2011-09-12 – 2011-09-14 (×3): 0.4 mg via TRANSDERMAL
  Filled 2011-09-12 (×3): qty 1

## 2011-09-12 NOTE — Discharge Summary (Signed)
[facility]   Consultants: Nephrology  Subjective: No longer dyspenic upon laying flat and no longer coughing up blood. He feels well. I have discussed the ECHO with him.   Objective: Blood pressure 161/99, pulse 88, temperature 97.8 F (36.6 C), temperature source Oral, resp. rate 20, height 5\' 10"  (1.778 m), weight 107.3 kg (236 lb 8.9 oz), SpO2 93.00%. Weight change:   Intake/Output Summary (Last 24 hours) at 09/12/11 1247 Last data filed at 09/12/11 1610  Gross per 24 hour  Intake 1097.5 ml  Output   1250 ml  Net -152.5 ml    Physical Exam: General appearance: alert, cooperative and no distress Throat: lips, mucosa, and tongue normal; teeth and gums normal Lungs: clear to auscultation bilaterally Heart: regular rate and rhythm, S1, S2 normal, no murmur, click, rub or gallop Abdomen: soft, non-tender; bowel sounds normal; no masses,  no organomegaly Extremities: extremities normal, atraumatic, no cyanosis or edema  Lab Results:  Basename 09/12/11 0600 09/11/11 1723 09/11/11 0404  NA 138 -- 143  K 3.0* -- 3.2*  CL 100 -- 104  CO2 26 -- --  GLUCOSE 102* -- 111*  BUN 23 -- 21  CREATININE 2.76* -- 2.70*  CALCIUM 8.6 -- --  MG -- 2.1 --  PHOS -- -- --    Basename 09/12/11 0600  AST 22  ALT 20  ALKPHOS 77  BILITOT 0.7  PROT 6.1  ALBUMIN 2.8*   No results found for this basename: LIPASE:2,AMYLASE:2 in the last 72 hours  Basename 09/12/11 0600 09/11/11 0404 09/11/11 0357  WBC 8.8 -- 8.2  NEUTROABS -- -- 6.6  HGB 11.3* 12.6* --  HCT 32.5* 37.0* --  MCV 85.1 -- 84.9  PLT 238 -- 233    Basename 09/12/11 0600 09/11/11 2049 09/11/11 1311  CKTOTAL 350* 489* 681*  CKMB 4.3* 5.3* 7.5*  CKMBINDEX -- -- --  TROPONINI <0.30 <0.30 <0.30   No components found with this basename: POCBNP:3 No results found for this basename: DDIMER:2 in the last 72 hours No results found for this basename: HGBA1C:2 in the last 72 hours No results found for this basename:  CHOL:2,HDL:2,LDLCALC:2,TRIG:2,CHOLHDL:2,LDLDIRECT:2 in the last 72 hours  Basename 09/11/11 1311  TSH 1.623  T4TOTAL --  T3FREE --  THYROIDAB --   No results found for this basename: VITAMINB12:2,FOLATE:2,FERRITIN:2,TIBC:2,IRON:2,RETICCTPCT:2 in the last 72 hours  Micro Results: Recent Results (from the past 240 hour(s))  MRSA PCR SCREENING     Status: Normal   Collection Time   09/11/11 12:59 PM      Component Value Range Status Comment   MRSA by PCR NEGATIVE  NEGATIVE  Final     Studies/Results: Dg Chest 2 View  09/11/2011  *RADIOLOGY REPORT*  Clinical Data: Shortness of breath, cough.  CHEST - 2 VIEW  Comparison: None.  Findings: Cardiomegaly.  Central vascular congestion.  Interstitial prominence.  No pneumothorax.  No pleural effusion.  No acute osseous abnormality.  IMPRESSION: Cardiomegaly with central vascular congestion.  Interstitial prominence may reflect edema or atypical pneumonia.  Original Report Authenticated By: Waneta Martins, M.D.    Medications: Scheduled Meds:   . amLODipine  10 mg Oral QHS  . aspirin EC  81 mg Oral Daily  . carvedilol  12.5 mg Oral NOW  . carvedilol  25 mg Oral BID WC  . furosemide  40 mg Oral BID  . hydrALAZINE  50 mg Oral Q12H  . nitroGLYCERIN  0.4 mg Transdermal Daily  . potassium chloride  40 mEq  Oral Daily  . potassium chloride  40 mEq Oral Q6H  . sodium chloride  3 mL Intravenous Q12H  . spironolactone  50 mg Oral BID  . DISCONTD: carvedilol  12.5 mg Oral BID WC  . DISCONTD: furosemide  80 mg Intravenous Q6H  . DISCONTD: furosemide  80 mg Oral BID  . DISCONTD: hydrALAZINE  100 mg Oral Q6H  . DISCONTD: hydrALAZINE  25 mg Oral Q6H  . DISCONTD: hydrALAZINE  50 mg Oral Q6H  . DISCONTD: hydrALAZINE  50 mg Oral Q12H  . DISCONTD: hydrALAZINE  50 mg Oral Q6H  . DISCONTD: hydrALAZINE  50 mg Oral Q8H  . DISCONTD: potassium chloride  40 mEq Oral Q6H   Continuous Infusions:   . sodium chloride    . nitroGLYCERIN 5 mcg/min  (09/12/11 1610)  . 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 Add Nitro patch and titrate off on Nitro drip.  He has had renal dopplers in the past which were negative for RAS  Acute on chronic systolic and diastolic CHF Resolved. Will decrease Lasix to 40 BID. Will likely need it chronically.   Renal failure Chronic and likely related to uncontrolled HTN   Anemia Likely of chronic disease-    Hypokalemia replacing  Code Status:Full  [facility] [phone] 09/12/2011, 12:47 PM  LOS: 1 day

## 2011-09-12 NOTE — Progress Notes (Signed)
[facility] - PROGRESS NOTE Resident Note   Please see below for attending addendum to resident note.  Subjective:   He reports feeling better- denies having cough or hemoptysis (  Likely was pinkish frothy sputum from pulmonary edema). " I want to go home". He was off NTG for few hours at night but had to restarted this AM with BP of 183/110. Currently at 3mcg/min.  Objective:    Vital Signs:   Temp:  [97.9 F (36.6 C)-98.5 F (36.9 C)] 98.1 F (36.7 C) (05/25 0400) Pulse Rate:  [83-96] 92  (05/24 1300) Resp:  [18-31] 20  (05/24 1600) BP: (148-216)/(29-136) 183/110 mmHg (05/25 0600) SpO2:  [91 %-100 %] 93 % (05/25 0400) Weight:  [236 lb 8.9 oz (107.3 kg)] 236 lb 8.9 oz (107.3 kg) (05/24 1300) Last BM Date: 09/11/11  24-hour weight change: Weight change:   Weight trends: Filed Weights   09/11/11 1300  Weight: 236 lb 8.9 oz (107.3 kg)    Intake/Output:  05/24 0701 - 05/25 0700 In: 1097.5 [P.O.:720; I.V.:361.5; IV Piggyback:16] Out: 2700 [Urine:2700]  Physical Exam: General: Vital signs reviewed and noted. Well-developed, well-nourished, in no acute distress; alert, appropriate and cooperative throughout examination.  Lungs:  Normal respiratory effort. Clear to auscultation BL   Heart: RRR. S1 and S2 normal without gallop, murmur, or rubs.  Abdomen:  BS normoactive. Soft, Nondistended, non-tender.  No masses or organomegaly.  Extremities: Trace pretibial edema.     Labs: Basic Metabolic Panel:  Lab 09/12/11 9562 09/11/11 1723 09/11/11 0404  NA 138 -- 143  K 3.0* -- 3.2*  CL 100 -- 104  CO2 26 -- --  GLUCOSE 102* -- 111*  BUN 23 -- 21  CREATININE 2.76* -- 2.70*  CALCIUM 8.6 -- --  MG -- 2.1 --  PHOS -- -- --    Liver Function Tests:  Lab 09/12/11 0600  AST 22  ALT 20  ALKPHOS 77  BILITOT 0.7  PROT 6.1  ALBUMIN 2.8*    CBC:  Lab 09/12/11 0600 09/11/11 0404 09/11/11 0357  WBC 8.8 -- 8.2  NEUTROABS -- -- 6.6  HGB 11.3* 12.6* 12.7*   HCT 32.5* 37.0* 36.5*  MCV 85.1 -- 84.9  PLT 238 -- 233    Cardiac Enzymes:  Lab 09/12/11 0600 09/11/11 2049 09/11/11 1311  CKTOTAL 350* 489* 681*  CKMB 4.3* 5.3* 7.5*  CKMBINDEX -- -- --  TROPONINI <0.30 <0.30 <0.30    Microbiology: Results for orders placed during the hospital encounter of 09/11/11  MRSA PCR SCREENING     Status: Normal   Collection Time   09/11/11 12:59 PM      Component Value Range Status Comment   MRSA by PCR NEGATIVE  NEGATIVE  Final      Urinalysis:  Basename 09/11/11 0843  COLORURINE YELLOW  LABSPEC 1.013  PHURINE 7.0  GLUCOSEU NEGATIVE  HGBUR SMALL*  BILIRUBINUR NEGATIVE  KETONESUR NEGATIVE  PROTEINUR >300*  UROBILINOGEN 0.2  NITRITE NEGATIVE  LEUKOCYTESUR NEGATIVE      Imaging: Dg Chest 2 View  09/11/2011  *RADIOLOGY REPORT*  Clinical Data: Shortness of breath, cough.  CHEST - 2 VIEW  Comparison: None.  Findings: Cardiomegaly.  Central vascular congestion.  Interstitial prominence.  No pneumothorax.  No pleural effusion.  No acute osseous abnormality.  IMPRESSION: Cardiomegaly with central vascular congestion.  Interstitial prominence may reflect edema or atypical pneumonia.  Original Report Authenticated By: Waneta Martins, M.D.      Medications:  Infusions:    . sodium chloride    . nitroGLYCERIN 5 mcg/min (09/12/11 1610)  . DISCONTD: 0.45 % NaCl with KCl 20 mEq / L    . DISCONTD: sodium chloride 10 mL/hr at 09/11/11 1245    Scheduled Medications:    . amLODipine  10 mg Oral QHS  . aspirin EC  81 mg Oral Daily  . carvedilol  12.5 mg Oral BID WC  . furosemide  80 mg Oral BID  . hydrALAZINE  50 mg Oral Q12H  . sodium chloride  3 mL Intravenous Q12H  . spironolactone  50 mg Oral BID  . DISCONTD: furosemide  80 mg Intravenous Q6H  . DISCONTD: hydrALAZINE  100 mg Oral Q6H  . DISCONTD: hydrALAZINE  25 mg Oral Q6H  . DISCONTD: hydrALAZINE  50 mg Oral Q6H    PRN Medications: acetaminophen, acetaminophen,  HYDROcodone-acetaminophen, labetalol, morphine injection, ondansetron (ZOFRAN) IV, ondansetron   Assessment/ Plan:    Pt is a 36 y.o. yo male with a PMHX of long standing of poorly HTN in the setting of non - compliance , was admitted to [facility] on 09/11/2011 with SOB and hemotysis. He has long standing history of uncontrolled HTN and presented with BP of 250 /165 and Cr of 2.7. He follows up with Dr. Eliott Nine as an outpatient and renal was consulted to assist in the management of his Hypertensive emergency and assist in getting records to know his baseline Cr.   1.Hypertensive emergency: BP on admission was 250/150 with flash pulmonary edema on CXR and inferolateral ST - T changes on his EKG. Creatinine is at baseline (2.5-2.7).  Renal ultrasound showed small kidneys from poorly controlled HTN about a year ago. Renal doppler was negative for RAS in 2012 -Titrate Nitro gtt. Recommend not to lower his BP rapidly not over 25% of his admission BP in  24 - 48 hrs to avoid CVAs, especially THIS IS close to his baseline BP (would keep BP close to 160-180's systolic's with diastolic's in 100-110's for today). - VMA, 24 h urine fractionated metanephrines, plasma metanepherines, dopamine, aldosterone/renin , urinary free cortisol pending. to rule out secondary causes of HTN. - Change Lasix to 40 PO bid. Continue  Carvedilol 25 bid , amlodipine 10/d (max) and  hydralazine 50 mg BID (max)  2. Progressive Chronic Renal failure stage 3: from poorly controlled HTN. Baseline Cr~2.5. His Cr was 2.38 in 01/13. He presents with Cr of 2.7 today. Suspect this is progression of his CKD in the setting of poorly controlled HTN. He has UOP of 2.7 litres since yesterday.  UA shows proteinuria, small amount of blood and no RBC.  3. Anemia : He presented with Hb- 12.6. His Hb is 11 today. He had Hb- 15 in 01/13. Continue to monitor CBC.  4. Hypokalemia : He presents with potassium of 3.2. Etiology like related to high renin state  of accelerated HTN (has never been hypokalemic in the past) . 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   6.Dispo- Continue to monitor in ICU    Length of Stay: 1 days  Patient history and plan of care reviewed with attending, Dr. Eliott Nine.   Elyse Jarvis, MD  PGYII, Internal Medicine Resident 09/12/2011, 7:40 AM  Patient seen and examined and agree with assessment and plan as above. Started back on po BP meds and hopefully this cocktail will work.  We are avoiding ACEI/ARB in the acute phase is case there is some renal injury acutely from malignant HTN, though these agents may be of benefit in the future.  Additional options are clonidine, minoxidil, alpha-blockers.  My next preference would be minoxidil, but would give him a couple more days on the current regimen to stabilize first.  I told him he would likely need to be here in the hospital several more days- not sure he wanted to hear that.  Vinson Moselle  MD [facility] [phone] pgr    [phone] cell 09/12/2011, 12:44 PM

## 2011-09-13 DIAGNOSIS — I5021 Acute systolic (congestive) heart failure: Secondary | ICD-10-CM

## 2011-09-13 DIAGNOSIS — E876 Hypokalemia: Secondary | ICD-10-CM

## 2011-09-13 DIAGNOSIS — I120 Hypertensive chronic kidney disease with stage 5 chronic kidney disease or end stage renal disease: Secondary | ICD-10-CM

## 2011-09-13 DIAGNOSIS — I10 Essential (primary) hypertension: Secondary | ICD-10-CM

## 2011-09-13 LAB — FOLATE: Folate: 7.7 ng/mL

## 2011-09-13 LAB — BASIC METABOLIC PANEL
BUN: 27 mg/dL — ABNORMAL HIGH (ref 6–23)
Chloride: 102 mEq/L (ref 96–112)
Creatinine, Ser: 2.6 mg/dL — ABNORMAL HIGH (ref 0.50–1.35)
GFR calc Af Amer: 35 mL/min — ABNORMAL LOW (ref >90)
GFR calc non Af Amer: 30 mL/min — ABNORMAL LOW (ref >90)
Potassium: 4 mEq/L (ref 3.5–5.1)

## 2011-09-13 LAB — CBC
HCT: 34.2 % — ABNORMAL LOW (ref 39.0–52.0)
MCHC: 34.2 g/dL (ref 30.0–36.0)
Platelets: 260 10*3/uL (ref 150–400)
RDW: 14.1 % (ref 11.5–15.5)
WBC: 7.1 10*3/uL (ref 4.0–10.5)

## 2011-09-13 LAB — VITAMIN B12: Vitamin B-12: 291 pg/mL (ref 211–911)

## 2011-09-13 LAB — IRON AND TIBC
Iron: 27 ug/dL — ABNORMAL LOW (ref 42–135)
UIBC: 252 ug/dL (ref 125–400)

## 2011-09-13 LAB — RETICULOCYTES
RBC.: 4.03 MIL/uL — ABNORMAL LOW (ref 4.22–5.81)
Retic Count, Absolute: 100.8 10*3/uL (ref 19.0–186.0)

## 2011-09-13 MED ORDER — HYDRALAZINE HCL 50 MG PO TABS
50.0000 mg | ORAL_TABLET | Freq: Three times a day (TID) | ORAL | Status: DC
  Administered 2011-09-13 – 2011-09-14 (×2): 50 mg via ORAL
  Filled 2011-09-13 (×5): qty 1

## 2011-09-13 MED ORDER — SODIUM CHLORIDE 0.9 % IV SOLN
250.0000 mg | Freq: Every day | INTRAVENOUS | Status: DC
  Administered 2011-09-13: 250 mg via INTRAVENOUS
  Filled 2011-09-13 (×3): qty 20

## 2011-09-13 MED ORDER — CLONIDINE HCL 0.2 MG/24HR TD PTWK
0.2000 mg | MEDICATED_PATCH | TRANSDERMAL | Status: DC
  Administered 2011-09-13: 0.2 mg via TRANSDERMAL
  Filled 2011-09-13: qty 1

## 2011-09-13 NOTE — Discharge Summary (Deleted)
[facility]   Consultants: Nephrology  Subjective: Feels good. No complaints.   Objective: Blood pressure 172/109, pulse 79, temperature 98.6 F (37 C), temperature source Oral, resp. rate 19, height 5\' 10"  (1.778 m), weight 107.3 kg (236 lb 8.9 oz), SpO2 97.00%. Weight change:   Intake/Output Summary (Last 24 hours) at 09/13/11 1549 Last data filed at 09/13/11 1500  Gross per 24 hour  Intake   1282 ml  Output   2875 ml  Net  -1593 ml    Physical Exam: General appearance: alert, cooperative and no distress Throat: lips, mucosa, and tongue normal; teeth and gums normal Lungs: clear to auscultation bilaterally Heart: regular rate and rhythm, S1, S2 normal, no murmur, click, rub or gallop Abdomen: soft, non-tender; bowel sounds normal; no masses,  no organomegaly Extremities: extremities normal, atraumatic, no cyanosis or edema  Lab Results:  Basename 09/13/11 0821 09/12/11 0600 09/11/11 1723  NA 137 138 --  K 4.0 3.0* --  CL 102 100 --  CO2 25 26 --  GLUCOSE 121* 102* --  BUN 27* 23 --  CREATININE 2.60* 2.76* --  CALCIUM 8.9 8.6 --  MG -- -- 2.1  PHOS -- -- --    Basename 09/12/11 0600  AST 22  ALT 20  ALKPHOS 77  BILITOT 0.7  PROT 6.1  ALBUMIN 2.8*   No results found for this basename: LIPASE:2,AMYLASE:2 in the last 72 hours  Basename 09/13/11 0821 09/12/11 0600 09/11/11 0357  WBC 7.1 8.8 --  NEUTROABS -- -- 6.6  HGB 11.7* 11.3* --  HCT 34.2* 32.5* --  MCV 84.9 85.1 --  PLT 260 238 --    Basename 09/12/11 0600 09/11/11 2049 09/11/11 1311  CKTOTAL 350* 489* 681*  CKMB 4.3* 5.3* 7.5*  CKMBINDEX -- -- --  TROPONINI <0.30 <0.30 <0.30   No components found with this basename: POCBNP:3 No results found for this basename: DDIMER:2 in the last 72 hours No results found for this basename: HGBA1C:2 in the last 72 hours No results found for this basename: CHOL:2,HDL:2,LDLCALC:2,TRIG:2,CHOLHDL:2,LDLDIRECT:2 in the last 72 hours  Basename 09/11/11  1311  TSH 1.623  T4TOTAL --  T3FREE --  THYROIDAB --    Basename 09/13/11 0821  VITAMINB12 291  FOLATE 7.7  FERRITIN 167  TIBC 279  IRON 27*  RETICCTPCT 2.5    Micro Results: Recent Results (from the past 240 hour(s))  MRSA PCR SCREENING     Status: Normal   Collection Time   09/11/11 12:59 PM      Component Value Range Status Comment   MRSA by PCR NEGATIVE  NEGATIVE  Final     Studies/Results: Dg Chest 2 View  09/11/2011  *RADIOLOGY REPORT*  Clinical Data: Shortness of breath, cough.  CHEST - 2 VIEW  Comparison: None.  Findings: Cardiomegaly.  Central vascular congestion.  Interstitial prominence.  No pneumothorax.  No pleural effusion.  No acute osseous abnormality.  IMPRESSION: Cardiomegaly with central vascular congestion.  Interstitial prominence may reflect edema or atypical pneumonia.  Original Report Authenticated By: Waneta Martins, M.D.    Medications: Scheduled Meds:    . amLODipine  10 mg Oral QHS  . aspirin EC  81 mg Oral Daily  . carvedilol  25 mg Oral BID WC  . cloNIDine  0.2 mg Transdermal Weekly  . furosemide  40 mg Oral BID  . hydrALAZINE  50 mg Oral Q8H  . nitroGLYCERIN  0.4 mg Transdermal Daily  . potassium chloride  40 mEq Oral Daily  .  potassium chloride  40 mEq Oral Q6H  . sodium chloride  3 mL Intravenous Q12H  . spironolactone  50 mg Oral BID  . DISCONTD: hydrALAZINE  50 mg Oral Q12H   Continuous Infusions:    . sodium chloride Stopped (09/13/11 0900)  . 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  Will not d/c home today  as planned earlier.  Will increase Hydralazine to 50 TID and add Clonidine patch TTS-2 today  Acute on chronic systolic and diastolic CHF- EF 96-04% with  significant LVH Resolved. Will decrease Lasix to 40 BID. Will likely need it chronically.   Renal failure Chronic and likely related to uncontrolled HTN   Anemia  chronic disease and iron defiency per Iron paned obtained today. Would give him a couple doses of IV iron and then start oral replacement. Check stool occults.    Hypokalemia Replaced- follow carefully as he is on aldactone and KCL.   Code Status:Full Dispo: will need to f/u with Dr Eliott Nine as Eustace Moore [phone] 09/13/2011, 3:49 PM  LOS: 2 days

## 2011-09-13 NOTE — Progress Notes (Addendum)
[facility] - PROGRESS NOTE Resident Note   Please see below for attending addendum to resident note.  Subjective:   2D echocardiogram: 35-40% EF, restrictive physiology, severe LVH, peak PA pressure . He reports doing well this AM, denies any chest pain or SOB or orthopnea/PND.  Making good urine output ~2.1L in past 24 hours.   He has been off of nitro gtt since 9PM (when his BP dropped to 140/90's) and currently has nitro patch on.  BP ranging from 140-180 systolic and 79-127 diastolic with average 160/100's.  Again asking when he can go home?  K 3-->4 with repletion Cr 2.76-->2.6 Hb 11.7  Objective:    Vital Signs:   Temp:  [97.8 F (36.6 C)-98.9 F (37.2 C)] 98.9 F (37.2 C) (05/26 0405) Pulse Rate:  [84-93] 89  (05/25 1600) Resp:  [16-19] 16  (05/26 0000) BP: (140-188)/(79-127) 167/106 mmHg (05/26 0600) SpO2:  [94 %-97 %] 97 % (05/26 0405) Last BM Date: 09/11/11  24-hour weight change: Weight change:   Weight trends: Filed Weights   09/11/11 1300  Weight: 236 lb 8.9 oz (107.3 kg)    Intake/Output:  05/25 0701 - 05/26 0700 In: 1254 [P.O.:1000; I.V.:254] Out: 2175 [Urine:2175]  General:  Vital signs reviewed and noted. Well-developed, well-nourished, in no acute distress; alert, appropriate and cooperative throughout examination.   Neck: No deformities, masses, or tenderness noted.Supple, No carotid Bruits, no JVD.  Lungs: Normal respiratory effort. Clear to auscultation BL with faint bilateral base at crackles.  Heart: RRR. S1 and S2 normal without gallop, murmur, or rubs.  Abdomen: BS normoactive. Soft, Nondistended, non-tender. No masses or organomegaly.  Extremities: trace pretibial edema.  Neurologic: nonfocal  Skin: No visible rashes,Tattoos on back/arms   Labs: Basic Metabolic Panel:  Lab 09/12/11 4098 09/11/11 1723 09/11/11 0404  NA 138 -- 143  K 3.0* -- 3.2*  CL 100 -- 104  CO2 26 -- --  GLUCOSE 102* -- 111*  BUN 23 -- 21    CREATININE 2.76* -- 2.70*  CALCIUM 8.6 -- --  MG -- 2.1 --  PHOS -- -- --   Liver Function Tests:  Lab 09/12/11 0600  AST 22  ALT 20  ALKPHOS 77  BILITOT 0.7  PROT 6.1  ALBUMIN 2.8*   CBC:  Lab 09/12/11 0600 09/11/11 0404 09/11/11 0357  WBC 8.8 -- 8.2  NEUTROABS -- -- 6.6  HGB 11.3* 12.6* 12.7*  HCT 32.5* 37.0* 36.5*  MCV 85.1 -- 84.9  PLT 238 -- 233   Cardiac Enzymes:  Lab 09/12/11 0600 09/11/11 2049 09/11/11 1311  CKTOTAL 350* 489* 681*  CKMB 4.3* 5.3* 7.5*  CKMBINDEX -- -- --  TROPONINI <0.30 <0.30 <0.30     Medications:    Infusions:    . sodium chloride    . nitroGLYCERIN Stopped (09/12/11 2100)    Scheduled Medications:    . amLODipine  10 mg Oral QHS  . aspirin EC  81 mg Oral Daily  . carvedilol  12.5 mg Oral NOW  . carvedilol  25 mg Oral BID WC  . furosemide  40 mg Oral BID  . hydrALAZINE  50 mg Oral Q12H  . nitroGLYCERIN  0.4 mg Transdermal Daily  . potassium chloride  40 mEq Oral Daily  . potassium chloride  40 mEq Oral Q6H  . sodium chloride  3 mL Intravenous Q12H  . spironolactone  50 mg Oral BID  . DISCONTD: carvedilol  12.5 mg Oral BID WC  . DISCONTD: furosemide  80 mg Oral BID  . DISCONTD: hydrALAZINE  50 mg Oral Q12H  . DISCONTD: hydrALAZINE  50 mg Oral Q6H  . DISCONTD: hydrALAZINE  50 mg Oral Q8H  . DISCONTD: potassium chloride  40 mEq Oral Q6H    PRN Medications: acetaminophen, acetaminophen, HYDROcodone-acetaminophen, labetalol, morphine injection, ondansetron (ZOFRAN) IV, ondansetron   Assessment/ Plan:   Pt is a 36 y.o. yo male with a PMHX of long standing of poorly HTN in the setting of non - compliance , was admitted to [facility] on 09/11/2011 with SOB and hemotysis. He has long standing history of uncontrolled HTN and presented with BP of 250 /165 and Cr of 2.7. He follows up with Dr. Eliott Nine as an outpatient and renal was consulted to assist in the management of his Hypertensive emergency and assist in getting records to know  his baseline Cr.   1.Hypertensive emergency: BP on admission was 250/150 with pulmonary edema on CXR and inferolateral ST - T changes on his EKG. Creatinine is at baseline (2.5-2.7). Renal ultrasound showed small kidneys from poorly controlled HTN about a year ago. Renal doppler was negative for RAS in 2012. Neg UDS -BP ranging from 140-180 systolic and 79-127 diastolic.  Improving slowly. -Off Nitro gtt.  Goal would be 150-160/90's today - VMA, 24 h urine fractionated metanephrines, plasma metanepherines,  aldosterone/renin , urinary free cortisol to rule out secondary causes of HTN: pending - Continue Lasix to 40 PO bid, Carvedilol 25 bid , amlodipine 10/d (max) and hydralazine 50 mg BID (max), Aldactone 50 mg bid  2. Progressive Chronic Renal failure stage 3: from poorly controlled HTN. Baseline Cr~2.5. His Cr was 2.38 in 01/13. He presents with Cr of 2.7-->2.76-->2.6. He has UOP of 2.1 liters in past 24 hrs. UA shows proteinuria, small amount of blood and no RBC.  3. Anemia : likely of chronic disease. He presented with Hb- 12.6-->11-->11.7. He had Hb- 15 in 01/13. Continue to monitor CBC. Will check anemia panel  4. Hypokalemia : 3.2-->3-->4. Etiology like related to high renin state of accelerated HTN (has never been hypokalemic in the past) and diuresis with Lasix yesterday. 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   6. Systolic Heart Failure: newly diagnosed.  EF 35-40%, severe LVH, restrictive physiology.  Continue Lasix 40mg  bid  7.Dispo- transfer to telemetry  Length of Stay: 2 days  Patient history and plan of care reviewed with attending, Dr. Arlean Hopping.  Mathis Dad, MD  PGYII, Internal Medicine Resident 09/13/2011, 6:49 AM  Patient seen and examined and agree with assessment and plan as above.  BP slowly improving, down to 160/90's mostly.  He is on 3 BP meds, 2 diuretics and po KCl.  OK for discharge from a renal standpoint, renal function is stable and BP is stabilizing.  Will call office on Tuesday to schedule follow up.  Will sign off, please call as needed.  Vinson Moselle  MD [facility] [phone] pgr    [phone] cell 09/13/2011, 12:54 PM

## 2011-09-13 NOTE — Progress Notes (Signed)
[facility]   Consultants: Nephrology  Subjective: Feels good. No complaints.   Objective: Blood pressure 184/124, pulse 79, temperature 98.6 F (37 C), temperature source Oral, resp. rate 19, height 5\' 10"  (1.778 m), weight 107.3 kg (236 lb 8.9 oz), SpO2 97.00%. Weight change:   Intake/Output Summary (Last 24 hours) at 09/13/11 1743 Last data filed at 09/13/11 1500  Gross per 24 hour  Intake   1259 ml  Output   2475 ml  Net  -1216 ml    Physical Exam: General appearance: alert, cooperative and no distress Throat: lips, mucosa, and tongue normal; teeth and gums normal Lungs: clear to auscultation bilaterally Heart: regular rate and rhythm, S1, S2 normal, no murmur, click, rub or gallop Abdomen: soft, non-tender; bowel sounds normal; no masses,  no organomegaly Extremities: extremities normal, atraumatic, no cyanosis or edema  Lab Results:  Basename 09/13/11 0821 09/12/11 0600 09/11/11 1723  NA 137 138 --  K 4.0 3.0* --  CL 102 100 --  CO2 25 26 --  GLUCOSE 121* 102* --  BUN 27* 23 --  CREATININE 2.60* 2.76* --  CALCIUM 8.9 8.6 --  MG -- -- 2.1  PHOS -- -- --    Basename 09/12/11 0600  AST 22  ALT 20  ALKPHOS 77  BILITOT 0.7  PROT 6.1  ALBUMIN 2.8*   No results found for this basename: LIPASE:2,AMYLASE:2 in the last 72 hours  Basename 09/13/11 0821 09/12/11 0600 09/11/11 0357  WBC 7.1 8.8 --  NEUTROABS -- -- 6.6  HGB 11.7* 11.3* --  HCT 34.2* 32.5* --  MCV 84.9 85.1 --  PLT 260 238 --    Basename 09/12/11 0600 09/11/11 2049 09/11/11 1311  CKTOTAL 350* 489* 681*  CKMB 4.3* 5.3* 7.5*  CKMBINDEX -- -- --  TROPONINI <0.30 <0.30 <0.30   No components found with this basename: POCBNP:3 No results found for this basename: DDIMER:2 in the last 72 hours No results found for this basename: HGBA1C:2 in the last 72 hours No results found for this basename: CHOL:2,HDL:2,LDLCALC:2,TRIG:2,CHOLHDL:2,LDLDIRECT:2 in the last 72 hours  Basename 09/11/11  1311  TSH 1.623  T4TOTAL --  T3FREE --  THYROIDAB --    Basename 09/13/11 0821  VITAMINB12 291  FOLATE 7.7  FERRITIN 167  TIBC 279  IRON 27*  RETICCTPCT 2.5    Micro Results: Recent Results (from the past 240 hour(s))  MRSA PCR SCREENING     Status: Normal   Collection Time   09/11/11 12:59 PM      Component Value Range Status Comment   MRSA by PCR NEGATIVE  NEGATIVE  Final     Studies/Results: Dg Chest 2 View  09/11/2011  *RADIOLOGY REPORT*  Clinical Data: Shortness of breath, cough.  CHEST - 2 VIEW  Comparison: None.  Findings: Cardiomegaly.  Central vascular congestion.  Interstitial prominence.  No pneumothorax.  No pleural effusion.  No acute osseous abnormality.  IMPRESSION: Cardiomegaly with central vascular congestion.  Interstitial prominence may reflect edema or atypical pneumonia.  Original Report Authenticated By: Waneta Martins, M.D.    Medications: Scheduled Meds:    . amLODipine  10 mg Oral QHS  . aspirin EC  81 mg Oral Daily  . carvedilol  25 mg Oral BID WC  . cloNIDine  0.2 mg Transdermal Weekly  . ferric gluconate (FERRLECIT/NULECIT) IV  250 mg Intravenous Daily  . furosemide  40 mg Oral BID  . hydrALAZINE  50 mg Oral Q8H  . nitroGLYCERIN  0.4 mg  Transdermal Daily  . potassium chloride  40 mEq Oral Daily  . potassium chloride  40 mEq Oral Q6H  . sodium chloride  3 mL Intravenous Q12H  . spironolactone  50 mg Oral BID  . DISCONTD: hydrALAZINE  50 mg Oral Q12H   Continuous Infusions:    . DISCONTD: sodium chloride Stopped (09/13/11 0900)  . 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  Will not d/c home today  as planned earlier.  Will increase Hydralazine to 50 TID and add  Clonidine patch TTS-2 today  Acute on chronic systolic and diastolic CHF- EF 16-10% with significant LVH Resolved. Will decrease Lasix to 40 BID. Will likely need it chronically.   Renal failure Chronic and likely related to uncontrolled HTN   Anemia  chronic disease and iron defiency per Iron paned obtained today. Would give him a couple doses of IV iron and then start oral replacement. Check stool occults.    Hypokalemia Replaced- follow carefully as he is on aldactone and KCL.   Code Status:Full Dispo: will need to f/u with Dr Eliott Nine as Eustace Moore [phone] 09/13/2011, 5:43 PM  LOS: 2 days

## 2011-09-14 DIAGNOSIS — E876 Hypokalemia: Secondary | ICD-10-CM

## 2011-09-14 DIAGNOSIS — I5021 Acute systolic (congestive) heart failure: Secondary | ICD-10-CM

## 2011-09-14 DIAGNOSIS — I10 Essential (primary) hypertension: Secondary | ICD-10-CM

## 2011-09-14 DIAGNOSIS — I120 Hypertensive chronic kidney disease with stage 5 chronic kidney disease or end stage renal disease: Secondary | ICD-10-CM

## 2011-09-14 LAB — BASIC METABOLIC PANEL
BUN: 30 mg/dL — ABNORMAL HIGH (ref 6–23)
CO2: 25 mEq/L (ref 19–32)
Chloride: 101 mEq/L (ref 96–112)
GFR calc non Af Amer: 29 mL/min — ABNORMAL LOW (ref >90)
Glucose, Bld: 124 mg/dL — ABNORMAL HIGH (ref 70–99)
Potassium: 4.1 mEq/L (ref 3.5–5.1)
Sodium: 135 mEq/L (ref 135–145)

## 2011-09-14 LAB — CBC
HCT: 34.4 % — ABNORMAL LOW (ref 39.0–52.0)
Hemoglobin: 11.7 g/dL — ABNORMAL LOW (ref 13.0–17.0)
RBC: 4.05 MIL/uL — ABNORMAL LOW (ref 4.22–5.81)
WBC: 6.8 10*3/uL (ref 4.0–10.5)

## 2011-09-14 MED ORDER — FUROSEMIDE 40 MG PO TABS: 40.0000 mg | ORAL_TABLET | Freq: Two times a day (BID) | ORAL | Status: DC

## 2011-09-14 MED ORDER — HYDRALAZINE HCL 50 MG PO TABS: 50.0000 mg | ORAL_TABLET | Freq: Three times a day (TID) | ORAL | Status: DC

## 2011-09-14 MED ORDER — CARVEDILOL 25 MG PO TABS: 25.0000 mg | ORAL_TABLET | Freq: Two times a day (BID) | ORAL | Status: DC

## 2011-09-14 MED ORDER — CYANOCOBALAMIN 1000 MCG/ML IJ SOLN
1000.0000 ug | Freq: Once | INTRAMUSCULAR | Status: AC
  Administered 2011-09-14: 1000 ug via SUBCUTANEOUS
  Filled 2011-09-14: qty 1

## 2011-09-14 MED ORDER — SPIRONOLACTONE 50 MG PO TABS: 50.0000 mg | ORAL_TABLET | Freq: Two times a day (BID) | ORAL | Status: DC

## 2011-09-14 MED ORDER — FERROUS SULFATE 324 (65 FE) MG PO TBEC: 1.0000 | DELAYED_RELEASE_TABLET | Freq: Two times a day (BID) | ORAL | Status: DC

## 2011-09-14 MED ORDER — CLONIDINE HCL 0.2 MG PO TABS: 0.2000 mg | ORAL_TABLET | Freq: Three times a day (TID) | ORAL | Status: DC

## 2011-09-14 MED ORDER — ASPIRIN 81 MG PO TBEC: 81.0000 mg | DELAYED_RELEASE_TABLET | Freq: Every day | ORAL | Status: DC

## 2011-09-14 MED ORDER — AMLODIPINE BESYLATE 10 MG PO TABS: 10.0000 mg | ORAL_TABLET | Freq: Every day | ORAL | Status: DC

## 2011-09-14 NOTE — Progress Notes (Signed)
All d/c instructions were reviewed with pt along with new medicines, prescriptions, low sodium diet instructions and arterial hypertension. Pt/wife verbalized understanding of all instructions given and agree to comply with medication regimen. IV removed, papers signed. Pt refused wheelchair assistance and requested that he be able to walk out. Pt was in no distress walking about the room and he was escorted by his wife to the car.

## 2011-09-14 NOTE — Progress Notes (Addendum)
[facility] - PROGRESS NOTE Resident Note   Please see below for attending addendum to resident note.  Subjective:   He states that he feels  "wonderful" and wants to go home.  BPs range 130-180/70-120's.  Hydralazine 50mg  was increased to TID & Clonidine 0.2mg  patch was added yesterday. Cr remains stable at 2.7, K is 4.1. Urine output is 2.5L   Objective:    Vital Signs:   Temp:  [97.6 F (36.4 C)-98.6 F (37 C)] 97.6 F (36.4 C) (05/26 2350) Pulse Rate:  [77-87] 87  (05/26 1500) Resp:  [16-19] 16  (05/27 0530) BP: (136-184)/(70-124) 162/107 mmHg (05/27 0533) SpO2:  [96 %-99 %] 96 % (05/27 0530) Last BM Date: 09/13/11  24-hour weight change: Weight change:   Weight trends: Filed Weights   09/11/11 1300  Weight: 236 lb 8.9 oz (107.3 kg)   Intake/Output:  05/26 0701 - 05/27 0700 In: 1453 [P.O.:1440; I.V.:13] Out: 2550 [Urine:2550]  Physical Exam: General:  Vital signs reviewed and noted. Well-developed, well-nourished, in no acute distress; alert, appropriate and cooperative throughout examination.  Neck: No deformities, masses, or tenderness noted. Supple, No carotid Bruits, no JVD.  Lungs: Normal respiratory effort. Clear to auscultation BL  Heart: RRR. S1 and S2 normal without gallop, murmur, or rubs.  Abdomen: BS normoactive. Soft, Nondistended, non-tender. No masses or organomegaly.  Extremities: no edema.  Neurologic: nonfocal  Skin: No visible rashes,Tattoos on back/arms   Labs: Basic Metabolic Panel:  Lab 09/14/11 4540 09/13/11 0821 09/12/11 0600 09/11/11 1723 09/11/11 0404  NA 135 137 138 -- 143  K 4.1 4.0 3.0* -- 3.2*  CL 101 102 100 -- 104  CO2 25 25 26  -- --  GLUCOSE 124* 121* 102* -- 111*  BUN 30* 27* 23 -- 21  CREATININE 2.71* 2.60* 2.76* -- 2.70*  CALCIUM 9.1 8.9 8.6 -- --  MG -- -- -- 2.1 --  PHOS -- -- -- -- --   CBC:  Lab 09/14/11 0437 09/13/11 0821 09/12/11 0600 09/11/11 0404 09/11/11 0357  WBC 6.8 7.1 8.8 -- 8.2    NEUTROABS -- -- -- -- 6.6  HGB 11.7* 11.7* 11.3* 12.6* 12.7*  HCT 34.4* 34.2* 32.5* 37.0* 36.5*  MCV 84.9 84.9 85.1 -- 84.9  PLT 282 260 238 -- 233     Medications:    Infusions:    . DISCONTD: sodium chloride Stopped (09/13/11 0900)  . DISCONTD: nitroGLYCERIN Stopped (09/12/11 2100)    Scheduled Medications:    . amLODipine  10 mg Oral QHS  . aspirin EC  81 mg Oral Daily  . carvedilol  25 mg Oral BID WC  . cloNIDine  0.2 mg Transdermal Weekly  . ferric gluconate (FERRLECIT/NULECIT) IV  250 mg Intravenous Daily  . furosemide  40 mg Oral BID  . hydrALAZINE  50 mg Oral Q8H  . nitroGLYCERIN  0.4 mg Transdermal Daily  . potassium chloride  40 mEq Oral Daily  . sodium chloride  3 mL Intravenous Q12H  . spironolactone  50 mg Oral BID  . DISCONTD: hydrALAZINE  50 mg Oral Q12H    PRN Medications: acetaminophen, acetaminophen, HYDROcodone-acetaminophen, labetalol, morphine injection, ondansetron (ZOFRAN) IV, ondansetron   Assessment/ Plan:   Pt is a 36 y.o. yo male with a PMHX of long standing of poorly HTN in the setting of non - compliance , was admitted to [facility] on 09/11/2011 with SOB and hemotysis. He has long standing history of uncontrolled HTN and presented with BP of 250 /165  and Cr of 2.7. He follows up with Dr. Eliott Nine as an outpatient and renal was consulted to assist in the management of his Hypertensive emergency and assist in getting records to know his baseline Cr.   1.Hypertensive emergency: BP on admission was 250/150 with pulmonary edema on CXR and inferolateral ST - T changes on his EKG. Creatinine is at baseline (2.5-2.7). Renal ultrasound showed small kidneys from poorly controlled HTN about a year ago. Renal doppler was negative for RAS in 2012. Neg UDS  -BP ranging from 130-180/70-120's. BP continues to fluctuates.  -Off Nitro gtt and now on nitro patch. Goal would be 150-160/90's today  - VMA, 24 h urine fractionated metanephrines, plasma metanepherines,  aldosterone/renin , urinary free cortisol to rule out secondary causes of HTN: pending  - Continue Lasix to 40 PO bid, Carvedilol 25 bid , amlodipine 10/d (max) and hydralazine 50 mg TID, Aldactone 50 mg bid, and Clonidine 02.mg transdermal patch.  2. Progressive Chronic Renal failure stage 3: from poorly controlled HTN. Baseline Cr~2.5. His Cr was 2.38 in 01/13. He presents with Cr of 2.7-->2.76-->2.6-->2.71. He has UOP of 2.5 liters in past 24 hrs. UA shows proteinuria, small amount of blood and no RBC.  3. Anemia : likely of chronic disease. He presented with Hb- 12.6-->11-->11.7. He had Hb- 15 in 01/13. Continue to monitor CBC. Will need to add iron supplementation if he is amenable to taking it Iron/TIBC/Ferritin    Component Value Date/Time   IRON 27* 09/13/2011 0821   TIBC 279 09/13/2011 0821   FERRITIN 167 09/13/2011 0821  Sat ratio : 10 Folate 7.7 Vit B12: 291  4. Hypokalemia : 3.2-->3-->4-->4.1. Now resolved. Etiology like related to high renin state of accelerated HTN (has never been hypokalemic in the past) and diuresis with Lasix yesterday. Mg -2.1,  -renin- aldosterone to rule out secondary causes of HTN (primary hyperaldosteronism): pending  -repleting-KCl daily.  Need to be cautious as he is on aldactone as well.   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  6. Systolic Heart Failure: newly diagnosed. EF 35-40%, severe LVH, restrictive physiology. Continue Lasix 40mg  bid   7.Dispo- home today  Length of Stay: 3 days  Patient history and plan of care reviewed with attending, Dr. Hyman Hopes.  Mathis Dad, MD  PGYII, Internal Medicine Resident 09/14/2011, 7:14 AM  Plan home blood pressure monitoring stressed compliance with medication and diet  Avoid any stressors to increase Blood pressure and follow up with Dr Eliott Nine

## 2011-09-14 NOTE — Discharge Instructions (Signed)
Arterial Hypertension Arterial hypertension (high blood pressure) is a condition of elevated pressure in your blood vessels. Hypertension over a long period of time is a risk factor for strokes, heart attacks, and heart failure. It is also the leading cause of kidney (renal) failure.  CAUSES   In Adults -- Over 90% of all hypertension has no known cause. This is called essential or primary hypertension. In the other 10% of people with hypertension, the increase in blood pressure is caused by another disorder. This is called secondary hypertension. Important causes of secondary hypertension are:   Heavy alcohol use.   Obstructive sleep apnea.   Hyperaldosterosim (Conn's syndrome).   Steroid use.   Chronic kidney failure.   Hyperparathyroidism.   Medications.   Renal artery stenosis.   Pheochromocytoma.   Cushing's disease.   Coarctation of the aorta.   Scleroderma renal crisis.   Licorice (in excessive amounts).   Drugs (cocaine, methamphetamine).  Your caregiver can explain any items above that apply to you.  In Children -- Secondary hypertension is more common and should always be considered.   Pregnancy -- Few women of childbearing age have high blood pressure. However, up to 10% of them develop hypertension of pregnancy. Generally, this will not harm the woman. It may be a sign of 3 complications of pregnancy: preeclampsia, HELLP syndrome, and eclampsia. Follow up and control with medication is necessary.  SYMPTOMS   This condition normally does not produce any noticeable symptoms. It is usually found during a routine exam.   Malignant hypertension is a late problem of high blood pressure. It may have the following symptoms:   Headaches.   Blurred vision.   End-organ damage (this means your kidneys, heart, lungs, and other organs are being damaged).   Stressful situations can increase the blood pressure. If a person with normal blood pressure has their blood  pressure go up while being seen by their caregiver, this is often termed "white coat hypertension." Its importance is not known. It may be related with eventually developing hypertension or complications of hypertension.   Hypertension is often confused with mental tension, stress, and anxiety.  DIAGNOSIS  The diagnosis is made by 3 separate blood pressure measurements. They are taken at least 1 week apart from each other. If there is organ damage from hypertension, the diagnosis may be made without repeat measurements. Hypertension is usually identified by having blood pressure readings:  Above 140/90 mmHg measured in both arms, at 3 separate times, over a couple weeks.   Over 130/80 mmHg should be considered a risk factor and may require treatment in patients with diabetes.  Blood pressure readings over 120/80 mmHg are called "pre-hypertension" even in non-diabetic patients. To get a true blood pressure measurement, use the following guidelines. Be aware of the factors that can alter blood pressure readings.  Take measurements at least 1 hour after caffeine.   Take measurements 30 minutes after smoking and without any stress. This is another reason to quit smoking - it raises your blood pressure.   Use a proper cuff size. Ask your caregiver if you are not sure about your cuff size.   Most home blood pressure cuffs are automatic. They will measure systolic and diastolic pressures. The systolic pressure is the pressure reading at the start of sounds. Diastolic pressure is the pressure at which the sounds disappear. If you are elderly, measure pressures in multiple postures. Try sitting, lying or standing.   Sit at rest for a minimum of   5 minutes before taking measurements.   You should not be on any medications like decongestants. These are found in many cold medications.   Record your blood pressure readings and review them with your caregiver.  If you have hypertension:  Your caregiver  may do tests to be sure you do not have secondary hypertension (see "causes" above).   Your caregiver may also look for signs of metabolic syndrome. This is also called Syndrome X or Insulin Resistance Syndrome. You may have this syndrome if you have type 2 diabetes, abdominal obesity, and abnormal blood lipids in addition to hypertension.   Your caregiver will take your medical and family history and perform a physical exam.   Diagnostic tests may include blood tests (for glucose, cholesterol, potassium, and kidney function), a urinalysis, or an EKG. Other tests may also be necessary depending on your condition.  PREVENTION  There are important lifestyle issues that you can adopt to reduce your chance of developing hypertension:  Maintain a normal weight.   Limit the amount of salt (sodium) in your diet.   Exercise often.   Limit alcohol intake.   Get enough potassium in your diet. Discuss specific advice with your caregiver.   Follow a DASH diet (dietary approaches to stop hypertension). This diet is rich in fruits, vegetables, and low-fat dairy products, and avoids certain fats.  PROGNOSIS  Essential hypertension cannot be cured. Lifestyle changes and medical treatment can lower blood pressure and reduce complications. The prognosis of secondary hypertension depends on the underlying cause. Many people whose hypertension is controlled with medicine or lifestyle changes can live a normal, healthy life.  RISKS AND COMPLICATIONS  While high blood pressure alone is not an illness, it often requires treatment due to its short- and long-term effects on many organs. Hypertension increases your risk for:  CVAs or strokes (cerebrovascular accident).   Heart failure due to chronically high blood pressure (hypertensive cardiomyopathy).   Heart attack (myocardial infarction).   Damage to the retina (hypertensive retinopathy).   Kidney failure (hypertensive nephropathy).  Your caregiver can  explain list items above that apply to you. Treatment of hypertension can significantly reduce the risk of complications. TREATMENT   For overweight patients, weight loss and regular exercise are recommended. Physical fitness lowers blood pressure.   Mild hypertension is usually treated with diet and exercise. A diet rich in fruits and vegetables, fat-free dairy products, and foods low in fat and salt (sodium) can help lower blood pressure. Decreasing salt intake decreases blood pressure in a 1/3 of people.   Stop smoking if you are a smoker.  The steps above are highly effective in reducing blood pressure. While these actions are easy to suggest, they are difficult to achieve. Most patients with moderate or severe hypertension end up requiring medications to bring their blood pressure down to a normal level. There are several classes of medications for treatment. Blood pressure pills (antihypertensives) will lower blood pressure by their different actions. Lowering the blood pressure by 10 mmHg may decrease the risk of complications by as much as 25%. The goal of treatment is effective blood pressure control. This will reduce your risk for complications. Your caregiver will help you determine the best treatment for you according to your lifestyle. What is excellent treatment for one person, may not be for you. HOME CARE INSTRUCTIONS   Do not smoke.   Follow the lifestyle changes outlined in the "Prevention" section.   If you are on medications, follow the directions   carefully. Blood pressure medications must be taken as prescribed. Skipping doses reduces their benefit. It also puts you at risk for problems.   Follow up with your caregiver, as directed.   If you are asked to monitor your blood pressure at home, follow the guidelines in the "Diagnosis" section above.  SEEK MEDICAL CARE IF:   You think you are having medication side effects.   You have recurrent headaches or lightheadedness.     You have swelling in your ankles.   You have trouble with your vision.  SEEK IMMEDIATE MEDICAL CARE IF:   You have sudden onset of chest pain or pressure, difficulty breathing, or other symptoms of a heart attack.   You have a severe headache.   You have symptoms of a stroke (such as sudden weakness, difficulty speaking, difficulty walking).  MAKE SURE YOU:   Understand these instructions.   Will watch your condition.   Will get help right away if you are not doing well or get worse.  Document Released: 04/06/2005 Document Revised: 03/26/2011 Document Reviewed: 11/04/2006 [employer] Patient Information 2012 [employer].Heart Failure Heart failure (HF) is a condition in which the heart has trouble pumping blood. This means your heart does not pump blood efficiently for your body to work well. In some cases of HF, fluid may back up into your lungs or you may have swelling (edema) in your lower legs. HF is a long-term (chronic) condition. It is important for you to take good care of yourself and follow your caregiver's treatment plan. CAUSES   Health conditions:   High blood pressure (hypertension) causes the heart muscle to work harder than normal. When pressure in the blood vessels is high, the heart needs to pump (contract) with more force in order to circulate blood throughout the body. High blood pressure eventually causes the heart to become stiff and weak.   Coronary artery disease (CAD) is the buildup of cholesterol and fat (plaques) in the arteries of the heart. The blockage in the arteries deprives the heart muscle of oxygen and blood. This can cause chest pain and may lead to a heart attack. High blood pressure can also contribute to CAD.   Heart attack (myocardial infarction) occurs when 1 or more arteries in the heart become blocked. The loss of oxygen damages the muscle tissue of the heart. When this happens, part of the heart muscle dies. The injured tissue does not  contract as well and weakens the heart's ability to pump blood.   Abnormal heart valves can cause HF when the heart valves do not open and close properly. This makes the heart muscle pump harder to keep the blood flowing.   Heart muscle disease (cardiomyopathy or myocarditis) is damage to the heart muscle from a variety of causes. These can include drug or alcohol abuse, infections, or unknown reasons. These can increase the risk of HF.   Lung disease makes the heart work harder because the lungs do not work properly. This can cause a strain on the heart leading it to fail.   Diabetes increases the risk of HF. High blood sugar contributes to high fat (lipid) levels in the blood. Diabetes can also cause slow damage to tiny blood vessels that carry important nutrients to the heart muscle. When the heart does not get enough oxygen and food, it can cause the heart to become weak and stiff. This leads to a heart that does not contract efficiently.   Other diseases can contribute to HF. These   include abnormal heart rhythms, thyroid problems, and low blood counts (anemia).   Unhealthy lifestyle habits:   Obesity.   Smoking.   Eating foods high in fat and cholesterol.   Eating or drinking beverages high in salt.   Drug or alcohol abuse.   Lack of exercise.  SYMPTOMS  HF symptoms may vary and can be hard to detect. Symptoms may include:  Shortness of breath with activity, such as climbing stairs.   Persistent cough.   Swelling of the feet, ankles, legs, or abdomen.   Unexplained weight gain.   Difficulty breathing when lying flat.   Waking from sleep because of the need to sit up and get more air.   Rapid heartbeat.   Fatigue and loss of energy.   Feeling lightheaded or close to fainting.  DIAGNOSIS  A diagnosis of HF is based on your history, symptoms, physical examination, and diagnostic tests. Diagnostic tests for HF may include:  EKG.   Chest X-ray.   Blood tests.    Exercise stress test.   Blood oxygen test (arterial blood gas).   Evaluation by a heart doctor (cardiologist).   Ultrasound evaluation of the heart (echocardiogram).   Heart artery test to look for blockages (angiogram).   Radioactive imaging to look at the heart (radionuclide test).  TREATMENT  Treatment is aimed at managing the symptoms of HF. Medicines, lifestyle changes, or surgical intervention may be necessary to treat HF.  Medicines to help treat HF may include:   Angiotensin-converting enzyme (ACE) inhibitors. These block the effects of a blood protein called angiotensin-converting enzyme. ACE inhibitors relax (dilate) the blood vessels and help lower blood pressure. This decreases the workload of the heart, slows the progression of HF, and improves symptoms.   Angiotensin receptor blockers (ARBs). These medications work similar to ACE inhibitors. ARBs may be an alternative for people who cannot tolerate an ACE inhibitor.   Aldosterone antagonists. This medication helps get rid of extra fluid from your body. This lowers the volume of blood the heart has to pump.   Water pills (diuretics). Diuretics cause the kidneys to remove salt and water from the blood. The extra fluid is removed by urination. By removing extra fluid from the body, diuretics help lower the workload of the heart and help prevent fluid buildup in the lungs so breathing is easier.   Beta blockers. These prevent the heart from beating too fast and improve heart muscle strength. Beta blockers help maintain a normal heart rate, control blood pressure, and improve HF symptoms.   Digitalis. This increases the force of the heartbeat and may be helpful to people with HF or heart rhythm problems.   Healthy lifestyle changes include:   Stopping smoking.   Eating a healthy diet. Avoid foods high in fat. Avoid foods fried in oil or made with fat. A dietician can help with healthy food choices.   Limiting how much  salt you eat.   Limiting alcohol intake to no more than 1 drink per day for women and 2 drinks per day for men. Drinking more than that is harmful to your heart. If your heart has already been damaged by alcohol or you have severe HF, drinking alcohol should be stopped completely.   Exercising as directed by your caregiver.   Surgical treatment for HF may include:   Procedures to open blocked arteries, repair damaged heart valves, or remove damaged heart muscle tissue.   A pacemaker to help heart muscle function and to control certain   abnormal heart rhythms.   A defibrillator to possibly prevent sudden cardiac death.  HOME CARE INSTRUCTIONS   Activity level. Your caregiver can help you determine what type of exercise program may be helpful. It is important to maintain your strength. Pace your physical activity to avoid shortness of breath or chest pain. Rest for 1 hour before and after meals. A cardiac rehabilitation program may be helpful to some people with HF.   Diet. Eat a heart healthy diet. Food choices should be low in saturated fat and cholesterol. Talk to a dietician to learn about heart healthy foods.   Salt intake. When you have HF, you need to limit the amount of salt you eat. Eat less than 1500 milligrams (mg) of salt per day or as recommended by your caregiver.   Weight monitoring. Weigh yourself every day. You should weigh yourself in the morning after you urinate and before you eat breakfast. Wear the same amount of clothing each time you weigh yourself. Record your weight daily. Bring your recorded weights to your clinic visits. Tell your caregiver right away if you have gained 3 lb/1.4 kg in 1 day, or 5 lb/2.3 kg in a week or whatever amount you were told to report.   Blood pressure monitoring. This should be done as directed by your caregiver. A home blood pressure cuff can be purchased at a drugstore. Record your blood pressure numbers and bring them to your clinic visits.  Tell your caregiver if you become dizzy or lightheaded upon standing up.   Smoking. If you are currently a smoker, it is time to quit. Nicotine makes your heart work harder by causing your blood vessels to constrict. Do not use nicotine gum or patches before talking to your caregiver.   Follow up. Be sure to schedule a follow-up visit with your caregiver. Keep all your appointments.  SEEK MEDICAL CARE IF:   Your weight increases by 3 lb/1.4 kg in 1 day or 5 lb/2.3 kg in a week.   You notice increasing shortness of breath that is unusual for you. This may happen during rest, sleep, or with activity.   You cough more than normal, especially with physical activity.   You notice more swelling in your hands, feet, ankles, or belly (abdomen).   You are unable to sleep because it is hard to breathe.   You cough up bloody mucus (sputum).   You begin to feel "jumping" or "fluttering" sensations (palpitations) in your chest.  SEEK IMMEDIATE MEDICAL CARE IF:   You have severe chest pain or pressure which may include symptoms such as:   Pain or pressure in the arms, neck, jaw, or back.   Feeling sweaty.   Feeling sick to your stomach (nauseous).   Feeling short of breath while at rest.   Having a fast or irregular heartbeat.   You experience stroke symptoms. These symptoms include:   Facial weakness or numbness.   Weakness or numbness in an arm, leg, or on one side of your body.   Blurred vision.   Difficulty talking or thinking.   Dizziness or fainting.   Severe headache.  THESE ARE MEDICAL EMERGENCIES. Do not wait to see if the symptoms go away. Call your local emergency services (911 in [place] DO NOT drive yourself to the hospital. IMPORTANT  Make a list of every medicine, vitamin, or herbal supplement you are taking. Keep the list with you at all times. Show it to your caregiver at every visit. Keep the   list up-to-date.   Ask your caregiver or pharmacist to write an  explanation of each medicine you are taking. This should include:   Why you are taking it.   The possible side effects.   The best time of day to take it.   Foods to take with it or what foods to avoid.   When to stop taking it.  MAKE SURE YOU:   Understand these instructions.   Will watch your condition.   Will get help right away if you are not doing well or get worse.  Document Released: 04/06/2005 Document Revised: 03/26/2011 Document Reviewed: 07/19/2009 [employer] Patient Information 2012 [employer]. 

## 2011-09-14 NOTE — Discharge Summary (Signed)
DISCHARGE SUMMARY  Barry Nichols  MR#: [id number]  DOB:May 16, 1975  Date of Admission: 09/11/2011 Date of Discharge: 09/14/2011  Attending Physician:Nekita Pita T  Patient's PCP:  Dr. Eliott Nine - [facility]  Consults: Nephrology  Disposition: D/C Home  Follow-up Appts:  Call Dr. Elza Rafter office on 09/15/2011 to arrange a follow-up visit within 7 days.  Discharge Orders    Future Orders Please Complete By Expires   Diet - low sodium heart healthy      Increase activity slowly      Discharge instructions      Comments:   Measure your BP once a day as instructed and record the values.  Call your MD if your BP readings are greater than 200 on 2 or more measurements or if any one reading is greater than 215.  Take your medications religiously.  Keep your scheduled follow up appointments with Dr. Eliott Nine, as further changes will likely need to be made in your medication treatment plan.     Tests Needing Follow-up: Recheck B12 level in 8-12 weeks - labs to asses for secondary HTN all pending at time of D/C  Discharge Diagnoses: Present on Admission:  .Malignant hypertensive urgency .Renal failure - acute on chronic .Anemia - Fe deficient  .Hypokalemia - resolved  Initial presentation: 36 yo man with long standing history of Hypertension who has not been taking his medications because he believed they were not working.  He presented to the ED today with exertional dyspnea and Blood pressure of 205/134.  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  Studies have been sent to investigate secondary HTN - BP control improved, but not yet at goal - pt educated on need for strict BP control and compliance with meds, as well as need for regular MD follow up   Acute on chronic systolic CHF- EF 41-32% with significant LVH  Well compensated clinically - Lasix 40 BID - ASA - aldactone - no ACE/ARB due to renal failure   Renal failure    Chronic with baseline crt of 2.5-2.7 - Renal doppler was negative for RAS in 2012 - Neg UDS -  Renal ultrasound showed small kidneys from poorly controlled HTN about a year ago - crt 2.71 on day of d/c - will cont to follow w/ [facility]   Anemia  chronic disease and iron defiency per iron panel - given a couple doses of IV iron and then started on oral replacement - Hgb 11.7 at time of d/c - fecal occult blood negative  Hypokalemia  Replaced - follow carefully as he is on aldactone - K+ 4.1 on day of d/c   Borderline B12 level  B12 noted to be 291 - pt was given a one time SQ dose prior to d/c - will need to be rechecked in 8-12 weeks  Medication List  As of 09/14/2011  9:36 AM   STOP taking these medications         pseudoephedrine 30 MG tablet         TAKE these medications         amLODipine 10 MG tablet   Commonly known as: NORVASC   Take 1 tablet (10 mg total) by mouth at bedtime.      aspirin 81 MG EC tablet   Take 1 tablet (81 mg total) by mouth daily.      carvedilol 25 MG tablet   Commonly known as: COREG   Take 1 tablet (25 mg total) by  mouth 2 (two) times daily with a meal.      cetirizine 10 MG tablet   Commonly known as: ZYRTEC   Take 10 mg by mouth daily.      cloNIDine 0.2 MG tablet   Commonly known as: CATAPRES   Take 1 tablet (0.2 mg total) by mouth 3 (three) times daily.      ferrous sulfate 324 (65 FE) MG Tbec   Take 1 tablet by mouth 2 (two) times daily with a meal.      furosemide 40 MG tablet   Commonly known as: LASIX   Take 1 tablet (40 mg total) by mouth 2 (two) times daily.      hydrALAZINE 50 MG tablet   Commonly known as: APRESOLINE   Take 1 tablet (50 mg total) by mouth every 8 (eight) hours.      PRESCRIPTION MEDICATION   Inhale 2 puffs into the lungs daily as needed. inhaler      spironolactone 50 MG tablet   Commonly known as: ALDACTONE   Take 1 tablet (50 mg total) by mouth 2 (two) times daily.           Day of  Discharge BP 194/122  Pulse 87  Temp(Src) 97.6 F (36.4 C) (Oral)  Resp 16  Ht 5\' 10"  (1.778 m)  Wt 107.3 kg (236 lb 8.9 oz)  BMI 33.94 kg/m2  SpO2 96%  Physical Exam: General: No acute respiratory distress Lungs: Clear to auscultation bilaterally without wheezes or crackles Cardiovascular: Regular rate and rhythm without murmur gallop or rub normal S1 and S2 Abdomen: Nontender, nondistended, soft, bowel sounds positive, no rebound, no ascites, no appreciable mass Extremities: No significant cyanosis, clubbing, or edema bilateral lower extremities  Results for orders placed during the hospital encounter of 09/11/11 (from the past 24 hour(s))  OCCULT BLOOD X 1 CARD TO LAB, STOOL     Status: Normal   Collection Time   09/13/11  8:05 PM      Component Value Range   Fecal Occult Bld NEGATIVE    CBC     Status: Abnormal   Collection Time   09/14/11  4:37 AM      Component Value Range   WBC 6.8  4.0 - 10.5 (K/uL)   RBC 4.05 (*) 4.22 - 5.81 (MIL/uL)   Hemoglobin 11.7 (*) 13.0 - 17.0 (g/dL)   HCT 11.9 (*) 14.7 - 52.0 (%)   MCV 84.9  78.0 - 100.0 (fL)   MCH 28.9  26.0 - 34.0 (pg)   MCHC 34.0  30.0 - 36.0 (g/dL)   RDW 82.9  56.2 - 13.0 (%)   Platelets 282  150 - 400 (K/uL)  BASIC METABOLIC PANEL     Status: Abnormal   Collection Time   09/14/11  4:37 AM      Component Value Range   Sodium 135  135 - 145 (mEq/L)   Potassium 4.1  3.5 - 5.1 (mEq/L)   Chloride 101  96 - 112 (mEq/L)   CO2 25  19 - 32 (mEq/L)   Glucose, Bld 124 (*) 70 - 99 (mg/dL)   BUN 30 (*) 6 - 23 (mg/dL)   Creatinine, Ser 8.65 (*) 0.50 - 1.35 (mg/dL)   Calcium 9.1  8.4 - 78.4 (mg/dL)   GFR calc non Af Amer 29 (*) >90 (mL/min)   GFR calc Af Amer 34 (*) >90 (mL/min)   Time spent in discharge (includes decision making & examination of pt): >30 minutes  09/14/2011, 9:36 AM   Lonia Blood, MD [facility] Office  [phone] Pager [phone]  On-Call/Text Page:      [email or web address]      password  [facility]

## 2011-09-17 LAB — METANEPHRINES, PLASMA: Normetanephrine, Free: 334 pg/mL — ABNORMAL HIGH (ref <=148)

## 2011-09-17 LAB — RENIN: Renin Activity: 7.08 ng/mL/h — ABNORMAL HIGH (ref 0.25–5.82)

## 2011-09-18 LAB — CORTISOL, URINE, FREE

## 2011-09-21 LAB — METANEPHRINES, URINE, 24 HOUR
Metaneph Total, Ur: 526
Metanephrines, Ur: 118
Normetanephrine, 24H Ur: 390

## 2012-05-21 ENCOUNTER — Emergency Department (HOSPITAL_COMMUNITY)
Admission: EM | Admit: 2012-05-21 | Discharge: 2012-05-21 | Disposition: A | Discharge status: Home / Self Care | Payer: Managed Care, Other (non HMO) | Attending: Emergency Medicine | Admitting: Emergency Medicine

## 2012-05-21 ENCOUNTER — Encounter (HOSPITAL_COMMUNITY): Payer: Self-pay | Admitting: Emergency Medicine

## 2012-05-21 DIAGNOSIS — I10 Essential (primary) hypertension: Secondary | ICD-10-CM

## 2012-05-21 DIAGNOSIS — Z87891 Personal history of nicotine dependence: Secondary | ICD-10-CM | POA: Insufficient documentation

## 2012-05-21 DIAGNOSIS — M5430 Sciatica, unspecified side: Secondary | ICD-10-CM | POA: Insufficient documentation

## 2012-05-21 DIAGNOSIS — I129 Hypertensive chronic kidney disease with stage 1 through stage 4 chronic kidney disease, or unspecified chronic kidney disease: Secondary | ICD-10-CM | POA: Insufficient documentation

## 2012-05-21 DIAGNOSIS — M5431 Sciatica, right side: Secondary | ICD-10-CM

## 2012-05-21 DIAGNOSIS — N189 Chronic kidney disease, unspecified: Secondary | ICD-10-CM | POA: Insufficient documentation

## 2012-05-21 DIAGNOSIS — Z79899 Other long term (current) drug therapy: Secondary | ICD-10-CM | POA: Insufficient documentation

## 2012-05-21 MED ORDER — METHYLPREDNISOLONE 4 MG PO TABS
8.0000 mg | ORAL_TABLET | Freq: Every day | ORAL | Status: DC
  Administered 2012-05-21: 8 mg via ORAL
  Filled 2012-05-21: qty 2

## 2012-05-21 MED ORDER — DIAZEPAM 5 MG PO TABS: 5.0000 mg | ORAL_TABLET | Freq: Three times a day (TID) | ORAL | Status: DC | PRN

## 2012-05-21 MED ORDER — METHYLPREDNISOLONE 4 MG PO KIT: PACK | ORAL | Status: DC

## 2012-05-21 MED ORDER — OXYCODONE-ACETAMINOPHEN 5-325 MG PO TABS
2.0000 | ORAL_TABLET | Freq: Once | ORAL | Status: AC
  Administered 2012-05-21: 2 via ORAL
  Filled 2012-05-21: qty 2

## 2012-05-21 MED ORDER — IBUPROFEN 800 MG PO TABS
800.0000 mg | ORAL_TABLET | Freq: Once | ORAL | Status: DC
  Filled 2012-05-21: qty 1

## 2012-05-21 MED ORDER — DIAZEPAM 5 MG PO TABS
5.0000 mg | ORAL_TABLET | Freq: Once | ORAL | Status: AC
  Administered 2012-05-21: 5 mg via ORAL
  Filled 2012-05-21: qty 1

## 2012-05-21 MED ORDER — OXYCODONE-ACETAMINOPHEN 5-325 MG PO TABS: 2.0000 | ORAL_TABLET | ORAL | Status: DC | PRN

## 2012-05-21 NOTE — ED Notes (Signed)
Pt unable to void at this time. 

## 2012-05-21 NOTE — ED Provider Notes (Signed)
History     CSN: [id number]  Arrival date & time 05/21/12  0312   First MD Initiated Contact with Patient 05/21/12 0354      Chief Complaint  Patient presents with  . Back Pain    (Consider location/radiation/quality/duration/timing/severity/associated sxs/prior treatment) HPI 37 year old male presents to emergency room complaining of right low back pain with radiation into his right hip. He denies any trauma, no new activities. He has been dealing with the pain, tonight the pain is worse. Patient reports he walked up 3 flights of stairs today, which may have triggered worsening pain. Patient denies any numbness to buttocks or rectum. There is no bowel or bladder incontinence. There is no fever.  Past Medical History  Diagnosis Date  . Hypertension     Negative duplex 2012 for RAS  . Hypertensive CKD (chronic kidney disease)     History reviewed. No pertinent past surgical history.  Family History  Problem Relation Age of Onset  . Hypertension Father     Also maternal grandmother  . Diabetes Father     also maternal grandmother  . Cancer Maternal Grandfather     lung  . Heart attack Maternal Grandfather     History  Substance Use Topics  . Smoking status: Former Games developer  . Smokeless tobacco: Not on file  . Alcohol Use: No      Review of Systems  All other systems reviewed and are negative.    Allergies  Review of patient's allergies indicates no known allergies.  Home Medications   Current Outpatient Rx  Name  Route  Sig  Dispense  Refill  . AMLODIPINE BESYLATE 10 MG PO TABS   Oral   Take 1 tablet (10 mg total) by mouth at bedtime.   30 tablet   0   . CARVEDILOL 25 MG PO TABS   Oral   Take 1 tablet (25 mg total) by mouth 2 (two) times daily with a meal.   60 tablet   0   . CLONIDINE HCL 0.2 MG PO TABS   Oral   Take 1 tablet (0.2 mg total) by mouth 3 (three) times daily.   90 tablet   0   . FUROSEMIDE 40 MG PO TABS   Oral   Take 1 tablet (40  mg total) by mouth 2 (two) times daily.   60 tablet   0     BP 192/103  Pulse 98  Temp 98.9 F (37.2 C) (Oral)  Resp 18  SpO2 100%  Physical Exam  Nursing note and vitals reviewed. Constitutional: He is oriented to person, place, and time. He appears well-developed and well-nourished. He appears distressed.       Hypertension noted he reports he has not taken his blood pressure medicines today  HENT:  Head: Normocephalic and atraumatic.  Nose: Nose normal.  Mouth/Throat: Oropharynx is clear and moist.  Eyes: Conjunctivae normal and EOM are normal. Pupils are equal, round, and reactive to light.  Neck: Normal range of motion. Neck supple. No JVD present. No tracheal deviation present. No thyromegaly present.  Cardiovascular: Normal rate, regular rhythm, normal heart sounds and intact distal pulses.  Exam reveals no gallop and no friction rub.   No murmur heard. Pulmonary/Chest: Effort normal and breath sounds normal. No stridor. No respiratory distress. He has no wheezes. He has no rales. He exhibits no tenderness.  Abdominal: Soft. Bowel sounds are normal. He exhibits no distension and no mass. There is no tenderness. There is no  rebound and no guarding.  Musculoskeletal: Normal range of motion. He exhibits tenderness (tenderness to palpation of the right SI joint. He has positive straight leg raise on the right.). He exhibits no edema.  Lymphadenopathy:    He has no cervical adenopathy.  Neurological: He is alert and oriented to person, place, and time. He has normal reflexes. No cranial nerve deficit. He exhibits normal muscle tone. Coordination normal.  Skin: Skin is warm and dry. No rash noted. No erythema. No pallor.  Psychiatric: He has a normal mood and affect. His behavior is normal. Judgment and thought content normal.    ED Course  Procedures (including critical care time)  Labs Reviewed - No data to display No results found.   1. Sciatica of right side   2.  Hypertension       MDM  37 year old male with right low back pain. History and physical suggest sciatica. Patient with history of poorly controlled hypertension, has not taken his medications today. He does not show any signs of end organ damage. No headache, no chest pain or shortness of breath. No red flags on history or physical. Will treat for pain, and refer patient to sports medicine        Olivia Mackie, MD 05/21/12 [phone]

## 2012-05-21 NOTE — ED Notes (Signed)
Reports lower back pain x 10 days.  Pt denies known injury.

## 2012-07-13 ENCOUNTER — Emergency Department (HOSPITAL_COMMUNITY): Payer: Managed Care, Other (non HMO)

## 2012-07-13 ENCOUNTER — Encounter (HOSPITAL_COMMUNITY): Payer: Self-pay | Admitting: Emergency Medicine

## 2012-07-13 ENCOUNTER — Emergency Department (HOSPITAL_COMMUNITY)
Admission: EM | Admit: 2012-07-13 | Discharge: 2012-07-13 | Disposition: A | Discharge status: Home / Self Care | Payer: Managed Care, Other (non HMO) | Attending: Emergency Medicine | Admitting: Emergency Medicine

## 2012-07-13 DIAGNOSIS — I509 Heart failure, unspecified: Secondary | ICD-10-CM | POA: Insufficient documentation

## 2012-07-13 DIAGNOSIS — Z79899 Other long term (current) drug therapy: Secondary | ICD-10-CM | POA: Insufficient documentation

## 2012-07-13 DIAGNOSIS — R059 Cough, unspecified: Secondary | ICD-10-CM | POA: Insufficient documentation

## 2012-07-13 DIAGNOSIS — Z87891 Personal history of nicotine dependence: Secondary | ICD-10-CM | POA: Insufficient documentation

## 2012-07-13 DIAGNOSIS — I129 Hypertensive chronic kidney disease with stage 1 through stage 4 chronic kidney disease, or unspecified chronic kidney disease: Secondary | ICD-10-CM | POA: Insufficient documentation

## 2012-07-13 DIAGNOSIS — R05 Cough: Secondary | ICD-10-CM | POA: Insufficient documentation

## 2012-07-13 DIAGNOSIS — N189 Chronic kidney disease, unspecified: Secondary | ICD-10-CM | POA: Insufficient documentation

## 2012-07-13 DIAGNOSIS — R042 Hemoptysis: Secondary | ICD-10-CM | POA: Insufficient documentation

## 2012-07-13 LAB — CBC WITH DIFFERENTIAL/PLATELET
Basophils Absolute: 0 10*3/uL (ref 0.0–0.1)
Basophils Relative: 0 % (ref 0–1)
Eosinophils Absolute: 0.2 10*3/uL (ref 0.0–0.7)
Hemoglobin: 12.9 g/dL — ABNORMAL LOW (ref 13.0–17.0)
MCH: 29.1 pg (ref 26.0–34.0)
MCHC: 35.7 g/dL (ref 30.0–36.0)
Monocytes Relative: 6 % (ref 3–12)
Neutro Abs: 2.3 10*3/uL (ref 1.7–7.7)
Neutrophils Relative %: 63 % (ref 43–77)
RDW: 14.3 % (ref 11.5–15.5)

## 2012-07-13 LAB — BASIC METABOLIC PANEL
BUN: 43 mg/dL — ABNORMAL HIGH (ref 6–23)
Calcium: 9.4 mg/dL (ref 8.4–10.5)
Creatinine, Ser: 2.63 mg/dL — ABNORMAL HIGH (ref 0.50–1.35)
GFR calc Af Amer: 35 mL/min — ABNORMAL LOW (ref >90)
GFR calc non Af Amer: 30 mL/min — ABNORMAL LOW (ref >90)
Glucose, Bld: 139 mg/dL — ABNORMAL HIGH (ref 70–99)

## 2012-07-13 LAB — PROTIME-INR: INR: 0.93 (ref 0.00–1.49)

## 2012-07-13 MED ORDER — SALINE SPRAY 0.65 % NA SOLN
2.0000 | Freq: Three times a day (TID) | NASAL | Status: DC
  Administered 2012-07-13: 2 via NASAL
  Filled 2012-07-13: qty 44

## 2012-07-13 NOTE — ED Notes (Signed)
Pt reports he felt like there was mucous in his mouth this morning that appeared bloody. Pt denies hx of recent cough, fever or sick contacts. NAD. No active bleeding on arrival. Lungs CTA. VSS.

## 2012-07-13 NOTE — ED Provider Notes (Signed)
History     CSN: [id number]  Arrival date & time 07/13/12  4098   First MD Initiated Contact with Patient 07/13/12 [phone]      Chief Complaint  Patient presents with  . Hemoptysis    (Consider location/radiation/quality/duration/timing/severity/associated sxs/prior treatment) HPI The patient presents with concerns of hemoptysis.  He states that he was in his usual state prior to going to bed last night.  He woke this morning, approximately 2 hours ago, with a full sensation in his posterior throat.  After coughing, the patient noted hemoptysis. He denies concurrent dyspnea, headache, chest pain, dysphagia, dysphonia. The patient has a notable history of chronic kidney disease, congestive heart failure. He says he takes all medication as directed. He denies any recent illness, such as URI like illness, sore throat, congestion.  Past Medical History  Diagnosis Date  . Hypertension     Negative duplex 2012 for RAS  . Hypertensive CKD (chronic kidney disease)     History reviewed. No pertinent past surgical history.  Family History  Problem Relation Age of Onset  . Hypertension Father     Also maternal grandmother  . Diabetes Father     also maternal grandmother  . Cancer Maternal Grandfather     lung  . Heart attack Maternal Grandfather     History  Substance Use Topics  . Smoking status: Former Games developer  . Smokeless tobacco: Not on file  . Alcohol Use: No      Review of Systems  All other systems reviewed and are negative.    Allergies  Review of patient's allergies indicates no known allergies.  Home Medications   Current Outpatient Rx  Name  Route  Sig  Dispense  Refill  . amLODipine (NORVASC) 10 MG tablet   Oral   Take 1 tablet (10 mg total) by mouth at bedtime.   30 tablet   0   . calcitRIOL (ROCALTROL) 0.25 MCG capsule   Oral   Take 0.25 mcg by mouth daily.         . carvedilol (COREG) 25 MG tablet   Oral   Take 1 tablet (25 mg total) by  mouth 2 (two) times daily with a meal.   60 tablet   0   . cloNIDine (CATAPRES) 0.2 MG tablet   Oral   Take 1 tablet (0.2 mg total) by mouth 3 (three) times daily.   90 tablet   0   . colchicine (COLCRYS) 0.6 MG tablet   Oral   Take 0.6 mg by mouth daily.         . Febuxostat (ULORIC) 80 MG TABS   Oral   Take 80 mg by mouth daily.         . furosemide (LASIX) 40 MG tablet   Oral   Take 80 mg by mouth 2 (two) times daily.         . metolazone (ZAROXOLYN) 2.5 MG tablet   Oral   Take 2.5 mg by mouth 2 (two) times a week. Days of the week are not specified.         . minoxidil (LONITEN) 10 MG tablet   Oral   Take 10 mg by mouth 2 (two) times daily.          . potassium chloride SA (K-DUR,KLOR-CON) 20 MEQ tablet   Oral   Take 40 mEq by mouth 3 (three) times daily.           BP 152/79  Pulse 80  Temp(Src) 97.9 F (36.6 C) (Oral)  Ht 5\' 10"  (1.778 m)  Wt 248 lb (112.492 kg)  BMI 35.58 kg/m2  SpO2 98%  Physical Exam  Nursing note and vitals reviewed. Constitutional: He is oriented to person, place, and time. He appears well-developed. No distress.  HENT:  Head: Normocephalic and atraumatic.  Mouth/Throat: Uvula is midline, oropharynx is clear and moist and mucous membranes are normal.  No posterior oral pharyngeal finding of note  Eyes: Conjunctivae and EOM are normal.  Cardiovascular: Normal rate and regular rhythm.   Pulmonary/Chest: Effort normal. No stridor. No respiratory distress.  Abdominal: He exhibits no distension.  Musculoskeletal: He exhibits no edema.  Neurological: He is alert and oriented to person, place, and time.  Skin: Skin is warm and dry.  Psychiatric: He has a normal mood and affect.    ED Course  Procedures (including critical care time)  Labs Reviewed  BASIC METABOLIC PANEL  CBC WITH DIFFERENTIAL  PROTIME-INR   No results found.   No diagnosis found.  Pulse ox 99% room air normal  12:57 PM On repeat exam the  patient is awake, alert, asking to go home. MDM  Patient presents after single episode of hemoptysis.  On exam his hemodynamic stable, in no distress.  Given his history of renal dysfunction, prior somewhat similar episode with subsequent diagnosis of congestive heart failure, the patient did have laboratory evaluation.  Notable lab abnormalities, including no anemia.  The patient's chest x-ray was unremarkable.  He remained in stable condition throughout his emergency department stay, was discharged in stable condition with primary care and ENT followup.  I explicitly discussed return precautions, the need for followup with the patient and his family members to       Gerhard Munch, MD 07/13/12 1258

## 2012-07-28 ENCOUNTER — Encounter: Payer: Self-pay | Admitting: Podiatry

## 2012-07-28 DIAGNOSIS — M21869 Other specified acquired deformities of unspecified lower leg: Secondary | ICD-10-CM

## 2012-08-19 ENCOUNTER — Ambulatory Visit (INDEPENDENT_AMBULATORY_CARE_PROVIDER_SITE_OTHER): Payer: Managed Care, Other (non HMO) | Admitting: Podiatry

## 2012-08-19 VITALS — Ht 70.0 in | Wt 262.0 lb

## 2012-08-19 DIAGNOSIS — M25579 Pain in unspecified ankle and joints of unspecified foot: Secondary | ICD-10-CM

## 2012-08-19 NOTE — Progress Notes (Signed)
Patient presents for follow up on Orthotic shoe inserts.  Stated that it took a while to get used to. Now they are fine. Helps him alot. He has noted no more foot pain. A: Satisfactory response to Orthotic treatment. P: Return as needed.

## 2012-09-24 ENCOUNTER — Telehealth: Payer: Self-pay

## 2012-09-24 ENCOUNTER — Ambulatory Visit (INDEPENDENT_AMBULATORY_CARE_PROVIDER_SITE_OTHER): Payer: Managed Care, Other (non HMO) | Admitting: Internal Medicine

## 2012-09-24 VITALS — BP 208/132 | HR 80 | Temp 98.1°F | Resp 18 | Ht 70.75 in | Wt 253.0 lb

## 2012-09-24 DIAGNOSIS — N289 Disorder of kidney and ureter, unspecified: Secondary | ICD-10-CM

## 2012-09-24 DIAGNOSIS — I10 Essential (primary) hypertension: Secondary | ICD-10-CM

## 2012-09-24 DIAGNOSIS — Z2089 Contact with and (suspected) exposure to other communicable diseases: Secondary | ICD-10-CM

## 2012-09-24 DIAGNOSIS — Z202 Contact with and (suspected) exposure to infections with a predominantly sexual mode of transmission: Secondary | ICD-10-CM

## 2012-09-24 LAB — POCT URINALYSIS DIPSTICK
Glucose, UA: NEGATIVE
Leukocytes, UA: NEGATIVE
Nitrite, UA: NEGATIVE
Urobilinogen, UA: 0.2
pH, UA: 6.5

## 2012-09-24 LAB — POCT UA - MICROSCOPIC ONLY
Casts, Ur, LPF, POC: NEGATIVE
Mucus, UA: NEGATIVE
Yeast, UA: NEGATIVE

## 2012-09-24 MED ORDER — METRONIDAZOLE 500 MG PO TABS: 500.0000 mg | ORAL_TABLET | Freq: Two times a day (BID) | ORAL | Status: DC

## 2012-09-24 NOTE — Addendum Note (Signed)
Addended by: Eddie Candle on: 09/24/2012 11:30 AM   Modules accepted: Level of Service

## 2012-09-24 NOTE — Patient Instructions (Signed)
Trichomoniasis °Trichomoniasis is an infection, caused by the Trichomonas organism, that affects both women and men. In women, the outer male genitalia and the vagina are affected. In men, the penis is mainly affected, but the prostate and other reproductive organs can also be involved. Trichomoniasis is a sexually transmitted disease (STD) and is most often passed to another person through sexual contact. The majority of people who get trichomoniasis do so from a sexual encounter and are also at risk for other STDs. °CAUSES  °· Sexual intercourse with an infected partner. °· It can be present in swimming pools or hot tubs. °SYMPTOMS  °· Abnormal gray-green frothy vaginal discharge in women. °· Vaginal itching and irritation in women. °· Itching and irritation of the area outside the vagina in women. °· Penile discharge with or without pain in males. °· Inflammation of the urethra (urethritis), causing painful urination. °· Bleeding after sexual intercourse. °RELATED COMPLICATIONS °· Pelvic inflammatory disease. °· Infection of the uterus (endometritis). °· Infertility. °· Tubal (ectopic) pregnancy. °· It can be associated with other STDs, including gonorrhea and chlamydia, hepatitis B, and HIV. °COMPLICATIONS DURING PREGNANCY °· Early (premature) delivery. °· Premature rupture of the membranes (PROM). °· Low birth weight. °DIAGNOSIS  °· Visualization of Trichomonas under the microscope from the vagina discharge. °· Ph of the vagina greater than 4.5, tested with a test tape. °· Trich Rapid Test. °· Culture of the organism, but this is not usually needed. °· It may be found on a Pap test. °· Having a "strawberry cervix,"which means the cervix looks very red like a strawberry. °TREATMENT  °· You may be given medication to fight the infection. Inform your caregiver if you could be or are pregnant. Some medications used to treat the infection should not be taken during pregnancy. °· Over-the-counter medications or  creams to decrease itching or irritation may be recommended. °· Your sexual partner will need to be treated if infected. °HOME CARE INSTRUCTIONS  °· Take all medication prescribed by your caregiver. °· Take over-the-counter medication for itching or irritation as directed by your caregiver. °· Do not have sexual intercourse while you have the infection. °· Do not douche or wear tampons. °· Discuss your infection with your partner, as your partner may have acquired the infection from you. Or, your partner may have been the person who transmitted the infection to you. °· Have your sex partner examined and treated if necessary. °· Practice safe, informed, and protected sex. °· See your caregiver for other STD testing. °SEEK MEDICAL CARE IF:  °· You still have symptoms after you finish the medication. °· You have an oral temperature above 102° F (38.9° C). °· You develop belly (abdominal) pain. °· You have pain when you urinate. °· You have bleeding after sexual intercourse. °· You develop a rash. °· The medication makes you sick or makes you throw up (vomit). °Document Released: 09/30/2000 Document Revised: 06/29/2011 Document Reviewed: 10/26/2008 °[employer]® Patient Information ©2014 [employer]. ° °

## 2012-09-24 NOTE — Telephone Encounter (Signed)
Pt was seen today and told that his lab results would be available in about 20 minutes and he has looked them up on my chart and they are not there and he would like his results He called again before I was able to  Put this message in   Best number [phone]

## 2012-09-24 NOTE — Progress Notes (Signed)
  Subjective:    Patient ID: Barry Nichols, male    DOB: Nov 16, 1975, 37 y.o.   MRN: [id number]  HPI Patient here to be tested for trichomonas his girlfriend was diagnosed with trichomoniasis yesterday. Patient had std screening in January everything was normal no new sex partner.    HTN and renal insuff followed closely by nephrology Dr. Eliott Nine, he did not take meds today.  Review of Systems See chart    Objective:   Physical Exam  Normal, no signs or sxs  BP 180/110    Assessment & Plan:  Flagyl 500mg  bid 5d Take bp meds!

## 2012-09-25 NOTE — Telephone Encounter (Signed)
his U/A is fine, he should take the medication as directed. Called him to advise. Left message for him to call me back.

## 2012-09-25 NOTE — Telephone Encounter (Signed)
Advised pt of notes 

## 2013-02-23 ENCOUNTER — Other Ambulatory Visit: Payer: Self-pay

## 2015-06-06 ENCOUNTER — Telehealth: Payer: Self-pay | Admitting: Internal Medicine

## 2015-06-06 NOTE — Telephone Encounter (Signed)
Pt aware of np appt on  06/17/15 :45 Referring Dr. Parke Simmers Dx-Lung

## 2015-06-14 ENCOUNTER — Other Ambulatory Visit: Payer: Self-pay | Admitting: Medical Oncology

## 2015-06-14 DIAGNOSIS — N19 Unspecified kidney failure: Secondary | ICD-10-CM

## 2015-06-14 DIAGNOSIS — D649 Anemia, unspecified: Secondary | ICD-10-CM

## 2015-06-17 ENCOUNTER — Ambulatory Visit: Payer: Managed Care, Other (non HMO) | Admitting: Internal Medicine

## 2015-06-17 ENCOUNTER — Other Ambulatory Visit: Payer: Managed Care, Other (non HMO)

## 2016-08-25 ENCOUNTER — Inpatient Hospital Stay (HOSPITAL_COMMUNITY)
Admission: EM | Admit: 2016-08-25 | Discharge: 2016-08-27 | DRG: 640 | Disposition: A | Discharge status: Home / Self Care | Payer: BLUE CROSS/BLUE SHIELD | Attending: Internal Medicine | Admitting: Internal Medicine

## 2016-08-25 ENCOUNTER — Encounter (HOSPITAL_COMMUNITY): Payer: Self-pay | Admitting: *Deleted

## 2016-08-25 ENCOUNTER — Emergency Department (HOSPITAL_COMMUNITY): Payer: BLUE CROSS/BLUE SHIELD

## 2016-08-25 DIAGNOSIS — Z6838 Body mass index (BMI) 38.0-38.9, adult: Secondary | ICD-10-CM | POA: Diagnosis not present

## 2016-08-25 DIAGNOSIS — Z9114 Patient's other noncompliance with medication regimen: Secondary | ICD-10-CM

## 2016-08-25 DIAGNOSIS — I132 Hypertensive heart and chronic kidney disease with heart failure and with stage 5 chronic kidney disease, or end stage renal disease: Secondary | ICD-10-CM | POA: Diagnosis present

## 2016-08-25 DIAGNOSIS — I248 Other forms of acute ischemic heart disease: Secondary | ICD-10-CM | POA: Diagnosis present

## 2016-08-25 DIAGNOSIS — Z833 Family history of diabetes mellitus: Secondary | ICD-10-CM

## 2016-08-25 DIAGNOSIS — D649 Anemia, unspecified: Secondary | ICD-10-CM

## 2016-08-25 DIAGNOSIS — N185 Chronic kidney disease, stage 5: Secondary | ICD-10-CM | POA: Diagnosis present

## 2016-08-25 DIAGNOSIS — Z8249 Family history of ischemic heart disease and other diseases of the circulatory system: Secondary | ICD-10-CM | POA: Diagnosis not present

## 2016-08-25 DIAGNOSIS — E611 Iron deficiency: Secondary | ICD-10-CM | POA: Diagnosis present

## 2016-08-25 DIAGNOSIS — Z9119 Patient's noncompliance with other medical treatment and regimen: Secondary | ICD-10-CM | POA: Diagnosis not present

## 2016-08-25 DIAGNOSIS — D638 Anemia in other chronic diseases classified elsewhere: Secondary | ICD-10-CM | POA: Diagnosis present

## 2016-08-25 DIAGNOSIS — Z79899 Other long term (current) drug therapy: Secondary | ICD-10-CM | POA: Diagnosis not present

## 2016-08-25 DIAGNOSIS — N179 Acute kidney failure, unspecified: Secondary | ICD-10-CM | POA: Diagnosis present

## 2016-08-25 DIAGNOSIS — M109 Gout, unspecified: Secondary | ICD-10-CM | POA: Diagnosis present

## 2016-08-25 DIAGNOSIS — I5032 Chronic diastolic (congestive) heart failure: Secondary | ICD-10-CM | POA: Diagnosis not present

## 2016-08-25 DIAGNOSIS — R778 Other specified abnormalities of plasma proteins: Secondary | ICD-10-CM | POA: Diagnosis not present

## 2016-08-25 DIAGNOSIS — N186 End stage renal disease: Secondary | ICD-10-CM

## 2016-08-25 DIAGNOSIS — I5043 Acute on chronic combined systolic (congestive) and diastolic (congestive) heart failure: Secondary | ICD-10-CM | POA: Diagnosis present

## 2016-08-25 DIAGNOSIS — G4733 Obstructive sleep apnea (adult) (pediatric): Secondary | ICD-10-CM | POA: Diagnosis present

## 2016-08-25 DIAGNOSIS — E162 Hypoglycemia, unspecified: Secondary | ICD-10-CM | POA: Diagnosis present

## 2016-08-25 DIAGNOSIS — Z66 Do not resuscitate: Secondary | ICD-10-CM | POA: Diagnosis present

## 2016-08-25 DIAGNOSIS — I1311 Hypertensive heart and chronic kidney disease without heart failure, with stage 5 chronic kidney disease, or end stage renal disease: Secondary | ICD-10-CM

## 2016-08-25 DIAGNOSIS — Z992 Dependence on renal dialysis: Secondary | ICD-10-CM

## 2016-08-25 DIAGNOSIS — I43 Cardiomyopathy in diseases classified elsewhere: Secondary | ICD-10-CM

## 2016-08-25 DIAGNOSIS — N289 Disorder of kidney and ureter, unspecified: Secondary | ICD-10-CM | POA: Insufficient documentation

## 2016-08-25 DIAGNOSIS — Z8739 Personal history of other diseases of the musculoskeletal system and connective tissue: Secondary | ICD-10-CM | POA: Diagnosis not present

## 2016-08-25 DIAGNOSIS — D631 Anemia in chronic kidney disease: Secondary | ICD-10-CM | POA: Diagnosis not present

## 2016-08-25 DIAGNOSIS — E669 Obesity, unspecified: Secondary | ICD-10-CM | POA: Diagnosis present

## 2016-08-25 DIAGNOSIS — I272 Pulmonary hypertension, unspecified: Secondary | ICD-10-CM | POA: Diagnosis present

## 2016-08-25 DIAGNOSIS — R0600 Dyspnea, unspecified: Secondary | ICD-10-CM | POA: Diagnosis not present

## 2016-08-25 DIAGNOSIS — D509 Iron deficiency anemia, unspecified: Secondary | ICD-10-CM | POA: Diagnosis not present

## 2016-08-25 DIAGNOSIS — Z87891 Personal history of nicotine dependence: Secondary | ICD-10-CM | POA: Diagnosis not present

## 2016-08-25 DIAGNOSIS — I5022 Chronic systolic (congestive) heart failure: Secondary | ICD-10-CM | POA: Diagnosis not present

## 2016-08-25 HISTORY — DX: Migraine, unspecified, not intractable, without status migrainosus: G43.909

## 2016-08-25 HISTORY — DX: Prediabetes: R73.03

## 2016-08-25 HISTORY — DX: Heart failure, unspecified: I50.9

## 2016-08-25 HISTORY — DX: Chronic kidney disease, stage 4 (severe): N18.4

## 2016-08-25 HISTORY — DX: Personal history of other diseases of the musculoskeletal system and connective tissue: Z87.39

## 2016-08-25 LAB — URINALYSIS, ROUTINE W REFLEX MICROSCOPIC
BACTERIA UA: NONE SEEN
Bilirubin Urine: NEGATIVE
Glucose, UA: NEGATIVE mg/dL
Hgb urine dipstick: NEGATIVE
Ketones, ur: NEGATIVE mg/dL
Leukocytes, UA: NEGATIVE
NITRITE: NEGATIVE
PH: 5 (ref 5.0–8.0)
Protein, ur: 100 mg/dL — AB
SPECIFIC GRAVITY, URINE: 1.006 (ref 1.005–1.030)
SQUAMOUS EPITHELIAL / LPF: NONE SEEN

## 2016-08-25 LAB — I-STAT TROPONIN, ED
TROPONIN I, POC: 0.04 ng/mL (ref 0.00–0.08)
Troponin i, poc: 0.09 ng/mL (ref 0.00–0.08)

## 2016-08-25 LAB — CBC WITH DIFFERENTIAL/PLATELET
Basophils Absolute: 0 10*3/uL (ref 0.0–0.1)
Basophils Relative: 0 %
Eosinophils Absolute: 0.1 10*3/uL (ref 0.0–0.7)
Eosinophils Relative: 2 %
HEMATOCRIT: 25 % — AB (ref 39.0–52.0)
Hemoglobin: 8.1 g/dL — ABNORMAL LOW (ref 13.0–17.0)
LYMPHS ABS: 0.7 10*3/uL (ref 0.7–4.0)
LYMPHS PCT: 11 %
MCH: 28.1 pg (ref 26.0–34.0)
MCHC: 32.4 g/dL (ref 30.0–36.0)
MCV: 86.8 fL (ref 78.0–100.0)
MONO ABS: 0.3 10*3/uL (ref 0.1–1.0)
MONOS PCT: 5 %
NEUTROS ABS: 5.1 10*3/uL (ref 1.7–7.7)
Neutrophils Relative %: 82 %
Platelets: 239 10*3/uL (ref 150–400)
RBC: 2.88 MIL/uL — ABNORMAL LOW (ref 4.22–5.81)
RDW: 14.4 % (ref 11.5–15.5)
WBC: 6.1 10*3/uL (ref 4.0–10.5)

## 2016-08-25 LAB — COMPREHENSIVE METABOLIC PANEL
ALBUMIN: 3.2 g/dL — AB (ref 3.5–5.0)
ALK PHOS: 72 U/L (ref 38–126)
ALT: 19 U/L (ref 17–63)
ANION GAP: 12 (ref 5–15)
AST: 20 U/L (ref 15–41)
BUN: 51 mg/dL — ABNORMAL HIGH (ref 6–20)
CHLORIDE: 106 mmol/L (ref 101–111)
CO2: 22 mmol/L (ref 22–32)
Calcium: 8.6 mg/dL — ABNORMAL LOW (ref 8.9–10.3)
Creatinine, Ser: 8.86 mg/dL — ABNORMAL HIGH (ref 0.61–1.24)
GFR calc Af Amer: 8 mL/min — ABNORMAL LOW (ref >60)
GFR calc non Af Amer: 7 mL/min — ABNORMAL LOW (ref >60)
GLUCOSE: 40 mg/dL — AB (ref 65–99)
POTASSIUM: 4.2 mmol/L (ref 3.5–5.1)
SODIUM: 140 mmol/L (ref 135–145)
Total Bilirubin: 0.6 mg/dL (ref 0.3–1.2)
Total Protein: 6.1 g/dL — ABNORMAL LOW (ref 6.5–8.1)

## 2016-08-25 LAB — GLUCOSE, CAPILLARY
Glucose-Capillary: 73 mg/dL (ref 65–99)
Glucose-Capillary: 88 mg/dL (ref 65–99)

## 2016-08-25 LAB — IRON AND TIBC
IRON: 38 ug/dL — AB (ref 45–182)
SATURATION RATIOS: 12 % — AB (ref 17.9–39.5)
TIBC: 316 ug/dL (ref 250–450)
UIBC: 278 ug/dL

## 2016-08-25 LAB — CBG MONITORING, ED
GLUCOSE-CAPILLARY: 110 mg/dL — AB (ref 65–99)
GLUCOSE-CAPILLARY: 162 mg/dL — AB (ref 65–99)
GLUCOSE-CAPILLARY: 116 mg/dL — AB (ref 65–99)
Glucose-Capillary: 29 mg/dL — CL (ref 65–99)
Glucose-Capillary: 121 mg/dL — ABNORMAL HIGH (ref 65–99)
Glucose-Capillary: 105 mg/dL — ABNORMAL HIGH (ref 65–99)

## 2016-08-25 LAB — FERRITIN: Ferritin: 126 ng/mL (ref 24–336)

## 2016-08-25 LAB — CK: Total CK: 249 U/L (ref 49–397)

## 2016-08-25 LAB — TROPONIN I: Troponin I: 0.06 ng/mL (ref <0.03)

## 2016-08-25 LAB — TSH: TSH: 1.511 u[IU]/mL (ref 0.350–4.500)

## 2016-08-25 LAB — PHOSPHORUS: Phosphorus: 3.9 mg/dL (ref 2.5–4.6)

## 2016-08-25 LAB — BETA-HYDROXYBUTYRIC ACID: BETA-HYDROXYBUTYRIC ACID: 0.1 mmol/L (ref 0.05–0.27)

## 2016-08-25 MED ORDER — MINOXIDIL 10 MG PO TABS: 10.0000 mg | ORAL_TABLET | Freq: Two times a day (BID) | ORAL | Status: DC

## 2016-08-25 MED ORDER — CALCITRIOL 0.5 MCG PO CAPS
0.5000 ug | ORAL_CAPSULE | Freq: Every day | ORAL | Status: DC
  Administered 2016-08-26 – 2016-08-27 (×2): 0.5 ug via ORAL
  Filled 2016-08-25 (×2): qty 1

## 2016-08-25 MED ORDER — CLONIDINE HCL 0.2 MG PO TABS
0.2000 mg | ORAL_TABLET | Freq: Three times a day (TID) | ORAL | Status: DC
  Administered 2016-08-25: 0.2 mg via ORAL
  Filled 2016-08-25: qty 1

## 2016-08-25 MED ORDER — FUROSEMIDE 80 MG PO TABS
160.0000 mg | ORAL_TABLET | Freq: Two times a day (BID) | ORAL | Status: DC
  Administered 2016-08-25 – 2016-08-27 (×4): 160 mg via ORAL
  Filled 2016-08-25 (×4): qty 2

## 2016-08-25 MED ORDER — HEPARIN SODIUM (PORCINE) 5000 UNIT/ML IJ SOLN
5000.0000 [IU] | Freq: Three times a day (TID) | INTRAMUSCULAR | Status: DC
  Administered 2016-08-25 – 2016-08-27 (×4): 5000 [IU] via SUBCUTANEOUS
  Filled 2016-08-25 (×4): qty 1

## 2016-08-25 MED ORDER — DEXTROSE 50 % IV SOLN
INTRAVENOUS | Status: AC
  Administered 2016-08-25: 50 mL
  Filled 2016-08-25: qty 50

## 2016-08-25 MED ORDER — SODIUM CHLORIDE 0.9% FLUSH
3.0000 mL | Freq: Two times a day (BID) | INTRAVENOUS | Status: DC
  Administered 2016-08-25 – 2016-08-26 (×3): 3 mL via INTRAVENOUS

## 2016-08-25 MED ORDER — SODIUM CHLORIDE 0.9 % IV SOLN
INTRAVENOUS | Status: DC
  Administered 2016-08-25: 16:00:00 via INTRAVENOUS

## 2016-08-25 MED ORDER — SENNOSIDES-DOCUSATE SODIUM 8.6-50 MG PO TABS: 1.0000 | ORAL_TABLET | Freq: Every evening | ORAL | Status: DC | PRN

## 2016-08-25 MED ORDER — CLONIDINE HCL 0.3 MG PO TABS
0.3000 mg | ORAL_TABLET | Freq: Three times a day (TID) | ORAL | Status: DC
  Administered 2016-08-25 – 2016-08-27 (×6): 0.3 mg via ORAL
  Filled 2016-08-25 (×6): qty 1

## 2016-08-25 MED ORDER — CARVEDILOL 25 MG PO TABS
25.0000 mg | ORAL_TABLET | Freq: Two times a day (BID) | ORAL | Status: DC
  Administered 2016-08-25 – 2016-08-27 (×5): 25 mg via ORAL
  Filled 2016-08-25 (×5): qty 1

## 2016-08-25 MED ORDER — ACETAMINOPHEN 650 MG RE SUPP: 650.0000 mg | Freq: Four times a day (QID) | RECTAL | Status: DC | PRN

## 2016-08-25 MED ORDER — SODIUM CHLORIDE 0.9 % IV BOLUS (SEPSIS)
1000.0000 mL | Freq: Once | INTRAVENOUS | Status: AC
  Administered 2016-08-25: 1000 mL via INTRAVENOUS

## 2016-08-25 MED ORDER — ACETAMINOPHEN 325 MG PO TABS: 650.0000 mg | ORAL_TABLET | Freq: Four times a day (QID) | ORAL | Status: DC | PRN

## 2016-08-25 MED ORDER — AMLODIPINE BESYLATE 10 MG PO TABS
10.0000 mg | ORAL_TABLET | Freq: Every day | ORAL | Status: DC
  Administered 2016-08-25 – 2016-08-26 (×2): 10 mg via ORAL
  Filled 2016-08-25 (×2): qty 1

## 2016-08-25 NOTE — ED Triage Notes (Signed)
Pt arrives from work via POV. Coworkers noted the pt was confused and sweating and had him leave work. Pt was disoriented upon arrival and profusely diaphoretic. Pt given 1 amp of D50, orange juice, crackers, peanut butter, coke and a sandwhich. Pt now a&o x4.

## 2016-08-25 NOTE — ED Notes (Signed)
CBG= 121

## 2016-08-25 NOTE — Consult Note (Signed)
Barry Nichols is an 41 y.o. male referred by Dr Barry Nichols   Chief Complaint: Uremia, CKD 5 HPI: [name] with CKD 4/5 sec HTN followed by Dr Barry Nichols presents to ER with decreased level of consciousness and found to be hypoglycemic, BS 40.  He admits that he took his glyburide that was prescribed some time ago for the first time today.  Last Scr was in 2/18 and was 4.1 and Hg was 13.3. Scr in ER is 8.86, BUN 51, Hg 8.1.  Over last month he says he has felt lightheaded and sleeping more.  Denies N/V and says his appetite is good but has not been eating as much as he is trying to lose weight?  Says he has been taking his meds.  Past Medical History:  Diagnosis Date  . Hallux limitus    Bilateral  . Hypertension    Negative duplex 2012 for RAS  . Hypertensive CKD (chronic kidney disease)   . 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 Posterior equinus, acquired    Bilateral  Gout Sec HPTH LVH sec HTN with EF 35-40% hyperlipidemia  History reviewed. No pertinent surgical history.     Family History  Problem Relation Age of Onset  . Hypertension Father     Also maternal grandmother  . Diabetes Father     also maternal grandmother  . Cancer Maternal Grandfather     lung  . Heart attack Maternal Grandfather   Grandmother with ESRD  Social History:  reports that he has quit smoking. He has never used smokeless tobacco. He reports that he does not drink alcohol or use drugs. Married, lives with with and 1 child.  Works for [employer].  Allergies: No Known Allergies  Medications Prior to Admission  Medication Sig Dispense Refill  . amLODipine (NORVASC) 10 MG tablet Take 1 tablet (10 mg total) by mouth at bedtime. 30 tablet 0  . calcitRIOL (ROCALTROL) 0.25 MCG capsule Take 0.5 mcg by mouth daily.     . carvedilol (COREG) 25 MG tablet Take 1 tablet (25 mg total) by mouth 2 (two) times daily with a meal. 60 tablet 0  . cloNIDine (CATAPRES) 0.2 MG tablet Take 1  tablet (0.2 mg total) by mouth 3 (three) times daily. 90 tablet 0  . colchicine (COLCRYS) 0.6 MG tablet Take 0.6 mg by mouth daily.    . furosemide (LASIX) 40 MG tablet Take 80 mg by mouth 2 (two) times daily.    . metolazone (ZAROXOLYN) 2.5 MG tablet Take 2.5 mg by mouth 3 (three) times a week. Days of the week are not specified.    . minoxidil (LONITEN) 10 MG tablet Take 10 mg by mouth 2 (two) times daily.     . potassium chloride SA (K-DUR,KLOR-CON) 20 MEQ tablet Take 40 mEq by mouth 3 (three) times daily.       Lab Results: UA: 0-5wbc, 0-5rbc, protein 100mg   Recent Labs  08/25/16 1056  WBC 6.1  HGB 8.1*  HCT 25.0*  PLT 239   BMET  Recent Labs  08/25/16 1056  NA 140  K 4.2  CL 106  CO2 22  GLUCOSE 40*  BUN 51*  CREATININE 8.86*  CALCIUM 8.6*   LFT  Recent Labs  08/25/16 1056  PROT 6.1*  ALBUMIN 3.2*  AST 20  ALT 19  ALKPHOS 72  BILITOT 0.6   Dg Chest 2 View  Result Date: 08/25/2016 CLINICAL DATA:  New onset of  shortness of breath, dizziness, and confusion since earlier today. History of hypertension but no other cardiopulmonary history. EXAM: CHEST  2 VIEW COMPARISON:  Chest x-ray of July 13, 2012 FINDINGS: The lungs are reasonably well inflated. There is no focal infiltrate. There is no pleural effusion. The cardiac silhouette is enlarged though this is not a new finding. The pulmonary vascularity is mildly prominent centrally. The bony thorax is unremarkable. IMPRESSION: Cardiomegaly with mild central pulmonary vascular prominence. No pulmonary edema or pneumonia. Electronically Signed   By: David  SwazilandJordan M.D.   On: 08/25/2016 13:27    ROS: Appetite sl decreased + DOE No CP. No abd pain No change in bowels No neuropathic Sxs No dysuria + edema  PHYSICAL EXAM: Blood pressure (!) 167/95, pulse 90, temperature 97.7 F (36.5 C), temperature source Oral, resp. rate 20, SpO2 100 %. HEENT: PERRLA EOMI NECK:No JVD LUNGS:Clear CARDIAC:RRR with S4  gallop ABD:+ BS NTND No HSM EXT:2+ edema Pulses: 2/4=bilat NEURO:CNI Ox3, M&SI, No asterixis  Assessment: 1. CKD 5 sec HTN with progression since Feb as went from Scr 4.1 to 8.8.  He wants to see if it will improve, I am not optimistic 2. Mild uremic Sxs 3. HTN 4. Hx gout 5. HTN cardiomyopathy 6. Anemia PLAN: 1. Meds reviewed and changes made.  He says he has been taking all his BP meds??  Will resume some of them and follow BP to see if truly needs all the meds listed for him to be on 2. Check iron studies and hemoccults.  May need to start ESA 3. Check PO4 and PTH 4. Discussed the reality of dialysis but he is resistant to considering it at this time.  He is agreeable to seeing dx videos.  Will continue discussions as denial is not an effective treatment 5. Hold K supplement for now   [name] 08/25/2016, 2:48 PM

## 2016-08-25 NOTE — ED Notes (Signed)
CBG 116  

## 2016-08-25 NOTE — ED Notes (Signed)
Lab called with critical glucose of 40-[place] EDP made aware

## 2016-08-25 NOTE — H&P (Signed)
Date: 08/25/2016               Patient Name:  Barry Nichols MRN: [id number]  DOB: 28-May-1975 Age / Sex: 41 y.o., male   PCP: Renaye RakersBland, Veita, MD         Medical Service: Internal Medicine Teaching Service         Attending Physician: Dr. Rogelia BogaButcher    First Contact: Dr. Nelson ChimesAmin Pager: [phone]  Second Contact: Dr. Loney Lohathore Pager: [phone]       After Hours (After 5p/  First Contact Pager: [phone]  weekends / holidays): Second Contact Pager: [phone]   Chief Complaint: Hypoglycemia  History of Present Illness: Barry Nichols is a 41 y.o. man with PMHx significant for hypertension, hypertensive cardiomyopathy, CKD stage V, and gout came to ED after feeding himself dizzy and diaphoretic today. He was found to have blood sugar in 20s.  According to patient he is having intermittent dizziness with no precipitating or alleviating factors going on for about a month. He states that he was feeling dizzy when he went to bed last night, this morning when he woke up he was not feeling well, he ate some fruit and went to work, where he continued to feel worse with worsening dizziness, lightheadedness and diaphoresis he was sent home from work. His wife checked his blood pressure it was 229/129 she gave him his antihypertensives and brought him to ED, in ED he was found to have hypoglycemic with CBG in 20s. His symptoms improved with glucose administration. With us he denied any other medication use which can lower his blood sugar but later with nephrology Dr. Briant CedarMattingly he admitted using glyburide which was prescribed some time ago for the first time today.  Patient denies any headaches but do endorse intermittent blurry vision mostly when he feels dizzy, he also complained of diaphoresis today along with some palpitations, he do endorse feeling more tired and sleepy, denies any nausea, vomiting, recent change in his appetite or bowel habits. He denies any presyncope or syncopal episode. He denies  any urinary symptoms.  Patient had a long-standing history of being noncompliant, in January he stopped taking all his meds and become vegetarian, approximately a month ago he restarted taking his meds because of persistently high blood pressure and restart eating meat.  He follow-up with Dr. Eliott Nineunham and Dr. Jacinto HalimGanji.  In ED he was found to have CBG of 29, creatinine of 8.86, mildly elevated troponin at 0.09, protein urea at 100 and was admitted for his hypoglycemia workup along with AK I.  Meds:  Current Meds  Medication Sig  . amLODipine (NORVASC) 10 MG tablet Take 1 tablet (10 mg total) by mouth at bedtime.  . calcitRIOL (ROCALTROL) 0.25 MCG capsule Take 0.5 mcg by mouth daily.   . carvedilol (COREG) 25 MG tablet Take 1 tablet (25 mg total) by mouth 2 (two) times daily with a meal.  . cloNIDine (CATAPRES) 0.2 MG tablet Take 1 tablet (0.2 mg total) by mouth 3 (three) times daily.  . colchicine (COLCRYS) 0.6 MG tablet Take 0.6 mg by mouth daily.  . furosemide (LASIX) 40 MG tablet Take 80 mg by mouth 2 (two) times daily.  . metolazone (ZAROXOLYN) 2.5 MG tablet Take 2.5 mg by mouth 3 (three) times a week. Days of the week are not specified.  . minoxidil (LONITEN) 10 MG tablet Take 10 mg by mouth 2 (two) times daily.   . potassium chloride SA (K-DUR,KLOR-CON) 20 MEQ tablet  Take 40 mEq by mouth 3 (three) times daily.     Allergies: Allergies as of 08/25/2016  . (No Known Allergies)   Past Medical History:  Diagnosis Date  . Hallux limitus    Bilateral  . Hypertension    Negative duplex 2012 for RAS  . Hypertensive CKD (chronic kidney disease)   . 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 Posterior equinus, acquired    Bilateral    Family History: Father had hypertension and diabetes.  Social History: He works in a [employer]. Used to smoke marijuana but quit 2 years ago. Denies any alcohol or illicit drug use.  Review of Systems: A complete ROS was negative except as  per HPI.   Physical Exam: Blood pressure (!) 166/94, pulse 98, temperature 98.7 F (37.1 C), temperature source Oral, resp. rate 20, SpO2 100 %. Vitals:   08/25/16 1400 08/25/16 1430 08/25/16 1445 08/25/16 1634  BP: (!) 167/95 (!) 166/96 (!) 157/82 (!) 166/94  Pulse: 90 93 94 98  Resp: 20 (!) 22 (!) 22 20  Temp:    98.7 F (37.1 C)  TempSrc:    Oral  SpO2: 100% 100% 100% 100%   General: Vital signs reviewed.  Patient is well-developed and well-nourished,Obese, in no acute distress and cooperative with exam.  Head: Normocephalic and atraumatic. Eyes: EOMI, conjunctivae normal, no scleral icterus.  Neck: Supple, trachea midline, normal ROM, no JVD, masses, thyromegaly, or carotid bruit present.  Cardiovascular: Mild tachycardia. no murmurs, gallops, or rubs. Pulmonary/Chest: Clear to auscultation bilaterally, no wheezes, rales, or rhonchi. Abdominal: Soft, non-tender, non-distended, BS +, no masses, organomegaly, or guarding present.  Musculoskeletal: No joint deformities, erythema, or stiffness, ROM full and nontender. Extremities: Asterixis, 2+ pitting edema up to below knees,  pulses symmetric and intact bilaterally. No cyanosis or clubbing. Neurological: A&O x3, Strength is normal and symmetric bilaterally, cranial nerve II-XII are grossly intact, no focal motor deficit, sensory intact to light touch bilaterally.  Skin: Warm, dry and intact. No rashes or erythema. Psychiatric: Normal mood and affect. speech and behavior is normal. Cognition and memory are normal.  Labs. CBC    Component Value Date/Time   WBC 6.1 08/25/2016 1056   RBC 2.88 (L) 08/25/2016 1056   HGB 8.1 (L) 08/25/2016 1056   HCT 25.0 (L) 08/25/2016 1056   PLT 239 08/25/2016 1056   MCV 86.8 08/25/2016 1056   MCH 28.1 08/25/2016 1056   MCHC 32.4 08/25/2016 1056   RDW 14.4 08/25/2016 1056   LYMPHSABS 0.7 08/25/2016 1056   MONOABS 0.3 08/25/2016 1056   EOSABS 0.1 08/25/2016 1056   BASOSABS 0.0 08/25/2016 1056     CMP Latest Ref Rng & Units 08/25/2016 07/13/2012 09/14/2011  Glucose 65 - 99 mg/dL 16(XW) 960(A) 540(J)  BUN 6 - 20 mg/dL 81(X) 91(Y) 78(G)  Creatinine 0.61 - 1.24 mg/dL 9.56(O) 1.30(Q) 6.57(Q)  Sodium 135 - 145 mmol/L 140 142 135  Potassium 3.5 - 5.1 mmol/L 4.2 3.3(L) 4.1  Chloride 101 - 111 mmol/L 106 102 101  CO2 22 - 32 mmol/L 22 28 25   Calcium 8.9 - 10.3 mg/dL 4.6(N) 9.4 9.1  Total Protein 6.5 - 8.1 g/dL 6.1(L) - -  Total Bilirubin 0.3 - 1.2 mg/dL 0.6 - -  Alkaline Phos 38 - 126 U/L 72 - -  AST 15 - 41 U/L 20 - -  ALT 17 - 63 U/L 19 - -   Urinalysis    Component Value Date/Time   COLORURINE  STRAW (A) 08/25/2016 1222   APPEARANCEUR CLEAR 08/25/2016 1222   LABSPEC 1.006 08/25/2016 1222   PHURINE 5.0 08/25/2016 1222   GLUCOSEU NEGATIVE 08/25/2016 1222   HGBUR NEGATIVE 08/25/2016 1222   BILIRUBINUR NEGATIVE 08/25/2016 1222   BILIRUBINUR neg 09/24/2012 1059   KETONESUR NEGATIVE 08/25/2016 1222   PROTEINUR 100 (A) 08/25/2016 1222   UROBILINOGEN 0.2 09/24/2012 1059   UROBILINOGEN 0.2 09/11/2011 0843   NITRITE NEGATIVE 08/25/2016 1222   LEUKOCYTESUR NEGATIVE 08/25/2016 1222   Troponin.0.09>>0.04 CK. 249 TSH. 1.511 Beta hydroxybutyric acid. 0.10  EKG: Sinus rhythm with T-wave inversion in leads 2, 3, aVF, V5 and V6-same changes seen in his previous EKG done in 2013.  CXR: FINDINGS: The lungs are reasonably well inflated. There is no focal infiltrate. There is no pleural effusion. The cardiac silhouette is enlarged though this is not a new finding. The pulmonary vascularity is mildly prominent centrally. The bony thorax is unremarkable.  IMPRESSION: Cardiomegaly with mild central pulmonary vascular prominence. No pulmonary edema or pneumonia.  Assessment & Plan by Problem:  KELYN KOSKELA is a 41 y.o. man with PMHx significant for hypertension, hypertensive cardiomyopathy, CKD stage V, and gout came to ED after feeding himself dizzy and diaphoretic today. He was found  to have blood sugar in 20s.  Hypoglycemia. Initially it was thought to be due to either decreased clearance of insulin because of his worsening renal functions, increased insulin production, exogenous insulin and we ordered insulin level, C-peptide, proinsulin/insulin ratio along with sulfonyleuria hypoglycemic panel. Later find out from Dr. Briant Cedar from nephrology that he used glyburide for the first time today which was prescribed some time ago because of hyperglycemia. The patient denied any other new medication use except for his routine antihypertensives with [place]. All of his blood workup results are pending. His first time glyburide use, with decrease by mouth intake, no diagnosis of diabetes and CK D stage V can explain his hypoglycemia. -Monitor CBG and for any other signs of hypoglycemia.  AKI On CKD  V. His last creatinine done in February 18 was 4.1. Today it was 8.8 . He do have mild symptoms of uremia. According to nephrology he is still in his denied phase about his renal failure. He is still hopeful that his will improve. He most likely needs dialysis. Nephrology is following. -Daily renal function.  Volume overload with history of HFrEF. He denied any symptoms of acute exacerbation including shortness of breath, chest pain. He was saturating well on room air. He do have lower extremity edema, most likely because of his worsening renal function. His previous echo done in 2013 shows reduced ejection fraction at 35-40% with diffuse hypokinesia and diastolic dysfunction. -Repeat echo. -Continue home dose of Coreg 25 mg twice daily. -Lasix 160 mg twice daily.   Mildly elevated troponin. Most likely due to AK I and demand ischemia because of volume overload and elevated blood pressure. ECG does show T-wave inversions and infralateral leads but those changes were present in his previous EKG done in 2013. -Trend troponin.  Hypertension. Patient has a long standing history of being  noncompliant. He was on amlodipine 10 mg daily, Coreg 25 mg twice daily, clonidine 0.2 mg 3 times a day, Lasix 80 mg twice daily, metolazone 2.5 mg 3 times a day and minoxidil 10 mg twice daily at home. Nephrology is following and they recommended -Lasix 160 mg twice daily. -Amlodipine 10 mg daily -Clonidine 0.3 mg 3 times a day. -Continue Coreg 25 mg twice daily.  They would like to hold minoxidil and metolazone at this time.  Worsening anemia. His hemoglobin dropped to 8.1 today, previously recorded in February 2018 was 13.3 according to nephrology note. -Anemia panel. -FOBT  History of gout. No sign of acute exacerbation. -Hold his home dose of colchicine.  CODE STATUS. DO NOT RESUSCITATE DVT prophylaxis. Heparin Diet. Renal   Dispo: Admit patient to Inpatient with expected length of stay greater than 2 midnights.  SignedArnetha Courser, MD 08/25/2016, 5:35 PM  Pager: [phone]

## 2016-08-25 NOTE — ED Provider Notes (Signed)
[facility]-EMERGENCY DEPT Provider Note   CSN: [id number] Arrival date & time: 08/25/16  1030     History   Chief Complaint Chief Complaint  Patient presents with  . Hypoglycemia    HPI Barry Nichols is a 41 y.o. male with CHF, CKD, and HTN who presents with reduced level of consciousness and found to be hypoglycemic.  He does not have history of diabetes.  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  At work he was diaphoretic, weak, nauseous, and confused and was sent home where his wife brought him to the ED.   Over the past month he has been feeling increasing poorly, with fatigue, generalized weakness, dyspnea, bilateral hand tremors, and dizziness, with daily episodes lasting ~15 minutes of worsening symptoms including confusion.  He has also had intermittent periorbital edema, feeling more cold, and increased somnolence.  He had not been taking his antihypertensives up until about a month ago, after attempting lifestyle changes (vegetarian diet, exercise) since January to control BP.  His wife is a diabetic on metformin alone, no other diabetes meds in the house.  He denies any insulin or hypoglycemic use.   HPI  Past Medical History:  Diagnosis Date  . Hallux limitus    Bilateral  . Hypertension    Negative duplex 2012 for RAS  . Hypertensive CKD (chronic kidney disease)   . 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 Posterior equinus, acquired    Bilateral    Patient Active Problem List   Diagnosis Date Noted  . Pain in joint, ankle and foot 08/19/2012  . Hypertension   . 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 Renal failure 09/11/2011  . Anemia 09/11/2011  . Non-compliant patient 09/11/2011    History reviewed. No pertinent surgical history.     Home Medications    Prior to Admission medications   Medication Sig Start Date End Date Taking? Authorizing Provider  amLODipine (NORVASC) 10 MG tablet  Take 1 tablet (10 mg total) by mouth at bedtime. 09/14/11 08/25/16 Yes Lonia Blood, MD  calcitRIOL (ROCALTROL) 0.25 MCG capsule Take 0.5 mcg by mouth daily.    Yes [provider]  carvedilol (COREG) 25 MG tablet Take 1 tablet (25 mg total) by mouth 2 (two) times daily with a meal. 09/14/11 08/25/16 Yes Lonia Blood, MD  cloNIDine (CATAPRES) 0.2 MG tablet Take 1 tablet (0.2 mg total) by mouth 3 (three) times daily. 09/14/11 08/25/16 Yes Lonia Blood, MD  colchicine (COLCRYS) 0.6 MG tablet Take 0.6 mg by mouth daily.   Yes [provider]  furosemide (LASIX) 40 MG tablet Take 80 mg by mouth 2 (two) times daily. 09/14/11 08/25/16 Yes Lonia Blood, MD  metolazone (ZAROXOLYN) 2.5 MG tablet Take 2.5 mg by mouth 3 (three) times a week. Days of the week are not specified.   Yes [provider]  minoxidil (LONITEN) 10 MG tablet Take 10 mg by mouth 2 (two) times daily.    Yes [provider]  potassium chloride SA (K-DUR,KLOR-CON) 20 MEQ tablet Take 40 mEq by mouth 3 (three) times daily.   Yes [provider]  metroNIDAZOLE (FLAGYL) 500 MG tablet Take 1 tablet (500 mg total) by mouth 2 (two) times daily with a meal. Patient not taking: Reported on 08/25/2016 09/24/12   Jonita Albee, MD    Family History Family History  Problem Relation Age of Onset  .  Hypertension Father     Also maternal grandmother  . Diabetes Father     also maternal grandmother  . Cancer Maternal Grandfather     lung  . Heart attack Maternal Grandfather     Social History Social History  Substance Use Topics  . Smoking status: Former Games developermoker  . Smokeless tobacco: Never Used  . Alcohol use No     Allergies   Patient has no known allergies.   Review of Systems Review of Systems  Constitutional: Positive for activity change, fatigue and unexpected weight change.  Respiratory: Negative for cough and shortness of breath.   Cardiovascular: Positive for leg  swelling. Negative for chest pain.  Gastrointestinal: Positive for nausea. Negative for constipation, diarrhea and vomiting.  Endocrine: Positive for cold intolerance. Negative for polydipsia and polyuria.  Genitourinary: Negative for decreased urine volume and frequency.  Skin: Negative for color change.  Neurological: Positive for tremors.  Psychiatric/Behavioral: Positive for confusion and decreased concentration.     Physical Exam Updated Vital Signs BP (!) 168/86   Pulse 91   Temp 97.7 F (36.5 C) (Oral)   Resp (!) 23   SpO2 100%   Physical Exam  Constitutional:  Somnolent, diaphoretic, obese man  HENT:  Mouth/Throat: Oropharynx is clear and moist.  Cardiovascular: Normal rate and regular rhythm.   Pulmonary/Chest: Effort normal and breath sounds normal.  Abdominal: Soft. He exhibits no distension. There is no tenderness.  Musculoskeletal:  2+ symmetric pitting edema of biLE to mid shin  Neurological:  Somnolent but arousable to voice     ED Treatments / Results  Labs (all labs ordered are listed, but only abnormal results are displayed) Labs Reviewed  COMPREHENSIVE METABOLIC PANEL - Abnormal; Notable for the following:       Result Value   Glucose, Bld 40 (*)    BUN 51 (*)    Creatinine, Ser 8.86 (*)    Calcium 8.6 (*)    Total Protein 6.1 (*)    Albumin 3.2 (*)    GFR calc non Af Amer 7 (*)    GFR calc Af Amer 8 (*)    All other components within normal limits  CBC WITH DIFFERENTIAL/PLATELET - Abnormal; Notable for the following:    RBC 2.88 (*)    Hemoglobin 8.1 (*)    HCT 25.0 (*)    All other components within normal limits  URINALYSIS, ROUTINE W REFLEX MICROSCOPIC - Abnormal; Notable for the following:    Color, Urine STRAW (*)    Protein, ur 100 (*)    All other components within normal limits  CBG MONITORING, ED - Abnormal; Notable for the following:    Glucose-Capillary 162 (*)    All other components within normal limits  CBG MONITORING, ED  - Abnormal; Notable for the following:    Glucose-Capillary 29 (*)    All other components within normal limits  I-STAT TROPOININ, ED - Abnormal; Notable for the following:    Troponin i, poc 0.09 (*)    All other components within normal limits  CBG MONITORING, ED - Abnormal; Notable for the following:    Glucose-Capillary 110 (*)    All other components within normal limits  CBG MONITORING, ED - Abnormal; Notable for the following:    Glucose-Capillary 116 (*)    All other components within normal limits  BETA-HYDROXYBUTYRIC ACID  CK  C-PEPTIDE  INSULIN, RANDOM  SULFONYLUREA HYPOGLYCEMICS PANEL, SERUM  HEMOGLOBIN A1C  PROINSULIN/INSULIN RATIO    EKG  EKG Interpretation None       Radiology No results found.  Procedures Procedures (including critical care time)  Medications Ordered in ED Medications  sodium chloride 0.9 % bolus 1,000 mL (not administered)     Initial Impression / Assessment and Plan / ED Course  I have reviewed the triage vital signs and the nursing notes.  Pertinent labs & imaging results that were available during my care of the patient were reviewed by me and considered in my medical decision making (see chart for details).  Hypoglycemia and altered mental status, both improved with juice and food, indicating symptomatic hypoglycemic event.  Denies exogenous insulin or other hypoglycemics. -CBC -CMP -Insulin level -C-peptide -A1c -BHOB  Clinical Course as of Aug 26 1310  Tue Aug 25, 2016  1154 Cr of 9, reports recent Cr's in 2s from nephrologist.  Will give IVF if prerenal component of AKI.  [MO]  1257 Consult for admission for hypoglycemia and AKI placed.  Discussed with IM Dr Isabella Bowens  [MO]    Clinical Course User Index [MO] Alm Bustard, MD     Final Clinical Impressions(s) / ED Diagnoses   Final diagnoses:  Hypoglycemia  Acute renal insufficiency    New Prescriptions New Prescriptions   No medications on file       Alm Bustard, MD 08/25/16 1313    Alvira Monday, MD 08/27/16 1610    Alvira Monday, MD 08/27/16 2248
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
## 2016-08-26 ENCOUNTER — Inpatient Hospital Stay (HOSPITAL_COMMUNITY): Payer: BLUE CROSS/BLUE SHIELD

## 2016-08-26 DIAGNOSIS — Z79899 Other long term (current) drug therapy: Secondary | ICD-10-CM

## 2016-08-26 DIAGNOSIS — I132 Hypertensive heart and chronic kidney disease with heart failure and with stage 5 chronic kidney disease, or end stage renal disease: Secondary | ICD-10-CM

## 2016-08-26 DIAGNOSIS — I5022 Chronic systolic (congestive) heart failure: Secondary | ICD-10-CM

## 2016-08-26 DIAGNOSIS — R0600 Dyspnea, unspecified: Secondary | ICD-10-CM

## 2016-08-26 LAB — CBC
HCT: 23.4 % — ABNORMAL LOW (ref 39.0–52.0)
Hemoglobin: 7.7 g/dL — ABNORMAL LOW (ref 13.0–17.0)
MCH: 28.6 pg (ref 26.0–34.0)
MCHC: 32.9 g/dL (ref 30.0–36.0)
MCV: 87 fL (ref 78.0–100.0)
Platelets: 238 10*3/uL (ref 150–400)
RBC: 2.69 MIL/uL — ABNORMAL LOW (ref 4.22–5.81)
RDW: 14.6 % (ref 11.5–15.5)
WBC: 5.9 10*3/uL (ref 4.0–10.5)

## 2016-08-26 LAB — RENAL FUNCTION PANEL
Albumin: 2.9 g/dL — ABNORMAL LOW (ref 3.5–5.0)
Anion gap: 14 (ref 5–15)
BUN: 55 mg/dL — ABNORMAL HIGH (ref 6–20)
CALCIUM: 8 mg/dL — AB (ref 8.9–10.3)
CO2: 22 mmol/L (ref 22–32)
Chloride: 103 mmol/L (ref 101–111)
Creatinine, Ser: 9.05 mg/dL — ABNORMAL HIGH (ref 0.61–1.24)
GFR calc Af Amer: 8 mL/min — ABNORMAL LOW (ref >60)
GFR calc non Af Amer: 6 mL/min — ABNORMAL LOW (ref >60)
GLUCOSE: 110 mg/dL — AB (ref 65–99)
Phosphorus: 6 mg/dL — ABNORMAL HIGH (ref 2.5–4.6)
Potassium: 4 mmol/L (ref 3.5–5.1)
SODIUM: 139 mmol/L (ref 135–145)

## 2016-08-26 LAB — HEMOGLOBIN A1C
Hgb A1c MFr Bld: 5.5 % (ref 4.8–5.6)
MEAN PLASMA GLUCOSE: 111 mg/dL

## 2016-08-26 LAB — GLUCOSE, CAPILLARY
GLUCOSE-CAPILLARY: 129 mg/dL — AB (ref 65–99)
GLUCOSE-CAPILLARY: 92 mg/dL (ref 65–99)
Glucose-Capillary: 112 mg/dL — ABNORMAL HIGH (ref 65–99)
Glucose-Capillary: 161 mg/dL — ABNORMAL HIGH (ref 65–99)
Glucose-Capillary: 165 mg/dL — ABNORMAL HIGH (ref 65–99)
Glucose-Capillary: 32 mg/dL — CL (ref 65–99)
Glucose-Capillary: 40 mg/dL — CL (ref 65–99)
Glucose-Capillary: 51 mg/dL — ABNORMAL LOW (ref 65–99)
Glucose-Capillary: 88 mg/dL (ref 65–99)
Glucose-Capillary: 95 mg/dL (ref 65–99)

## 2016-08-26 LAB — ECHOCARDIOGRAM COMPLETE
Height: 70 in
WEIGHTICAEL: 4268.11 [oz_av]

## 2016-08-26 LAB — MAGNESIUM: MAGNESIUM: 1.7 mg/dL (ref 1.7–2.4)

## 2016-08-26 LAB — TROPONIN I: TROPONIN I: 0.06 ng/mL — AB (ref <0.03)

## 2016-08-26 LAB — C-PEPTIDE: C PEPTIDE: 24.6 ng/mL — AB (ref 1.1–4.4)

## 2016-08-26 LAB — HEPATITIS B SURFACE ANTIGEN: HEP B S AG: NEGATIVE

## 2016-08-26 LAB — INSULIN, RANDOM: INSULIN: 86.4 u[IU]/mL — AB (ref 2.6–24.9)

## 2016-08-26 LAB — HIV ANTIBODY (ROUTINE TESTING W REFLEX): HIV Screen 4th Generation wRfx: NONREACTIVE

## 2016-08-26 MED ORDER — DEXTROSE 50 % IV SOLN
INTRAVENOUS | Status: AC
  Filled 2016-08-26: qty 50

## 2016-08-26 MED ORDER — SODIUM CHLORIDE 0.9 % IV SOLN
510.0000 mg | INTRAVENOUS | Status: DC
  Administered 2016-08-26: 510 mg via INTRAVENOUS
  Filled 2016-08-26: qty 17

## 2016-08-26 MED ORDER — PERFLUTREN LIPID MICROSPHERE
1.0000 mL | INTRAVENOUS | Status: AC | PRN
  Administered 2016-08-26: 2 mL via INTRAVENOUS
  Filled 2016-08-26: qty 10

## 2016-08-26 MED ORDER — DEXTROSE 50 % IV SOLN
INTRAVENOUS | Status: AC
  Administered 2016-08-26: 03:00:00
  Administered 2016-08-26: 50 mL
  Filled 2016-08-26: qty 50

## 2016-08-26 MED ORDER — DEXTROSE 5 % IV SOLN
INTRAVENOUS | Status: DC
  Administered 2016-08-26: 01:00:00 via INTRAVENOUS

## 2016-08-26 NOTE — Progress Notes (Signed)
CBG keeps drooping down below 50's. D50 50 ml given twice this shift already. Drank 240 cc of Nepro for Hs snack. Will page MD for update.

## 2016-08-26 NOTE — Progress Notes (Signed)
S: Ate all his breakfast.  Plans to see dx videos today.  Still no decision on what he wants to do O:BP (!) 159/89 (BP Location: Right Arm)   Pulse 91   Temp 98.6 F (37 C) (Oral)   Resp 18   Ht 5\' 10"  (1.778 m)   Wt 121 kg (266 lb 12.1 oz)   SpO2 100%   BMI 38.28 kg/m   Intake/Output Summary (Last 24 hours) at 08/26/16 0929 Last data filed at 08/26/16 0600  Gross per 24 hour  Intake            907.5 ml  Output              450 ml  Net            457.5 ml   Weight change:  WGN:FAOZHGen:Awake and alert CVS:RRR no rub Resp:Clear Abd:+ BS NTND Ext: 1+ edema NEURO:CNI Ox3 no asterixis   . amLODipine  10 mg Oral QHS  . calcitRIOL  0.5 mcg Oral Daily  . carvedilol  25 mg Oral BID WC  . cloNIDine  0.3 mg Oral TID  . dextrose      . furosemide  160 mg Oral BID  . heparin  5,000 Units Subcutaneous Q8H  . sodium chloride flush  3 mL Intravenous Q12H   Dg Chest 2 View  Result Date: 08/25/2016 CLINICAL DATA:  New onset of shortness of breath, dizziness, and confusion since earlier today. History of hypertension but no other cardiopulmonary history. EXAM: CHEST  2 VIEW COMPARISON:  Chest x-ray of July 13, 2012 FINDINGS: The lungs are reasonably well inflated. There is no focal infiltrate. There is no pleural effusion. The cardiac silhouette is enlarged though this is not a new finding. The pulmonary vascularity is mildly prominent centrally. The bony thorax is unremarkable. IMPRESSION: Cardiomegaly with mild central pulmonary vascular prominence. No pulmonary edema or pneumonia. Electronically Signed   By: David  SwazilandJordan M.D.   On: 08/25/2016 13:27   BMET    Component Value Date/Time   NA 139 08/26/2016 0315   K 4.0 08/26/2016 0315   CL 103 08/26/2016 0315   CO2 22 08/26/2016 0315   GLUCOSE 110 (H) 08/26/2016 0315   BUN 55 (H) 08/26/2016 0315   CREATININE 9.05 (H) 08/26/2016 0315   CALCIUM 8.0 (L) 08/26/2016 0315   GFRNONAA 6 (L) 08/26/2016 0315   GFRAA 8 (L) 08/26/2016 0315   CBC     Component Value Date/Time   WBC 5.9 08/26/2016 0315   RBC 2.69 (L) 08/26/2016 0315   HGB 7.7 (L) 08/26/2016 0315   HCT 23.4 (L) 08/26/2016 0315   PLT 238 08/26/2016 0315   MCV 87.0 08/26/2016 0315   MCH 28.6 08/26/2016 0315   MCHC 32.9 08/26/2016 0315   RDW 14.6 08/26/2016 0315   LYMPHSABS 0.7 08/25/2016 1056   MONOABS 0.3 08/25/2016 1056   EOSABS 0.1 08/25/2016 1056   BASOSABS 0.0 08/25/2016 1056     Assessment:  1. CKD 5 sec HTN 2. HTN 3. HTN cardiomyopathy 4. Hypoglycemia sec glyburide 5. Anemia, Fe def  Plan: 1. IV iron 2. Await his decision regarding dialysis.  Not sure he needs HD now but at least he should get access placed 3. Recheck labs in AM 4.  PTH P   Yuvin Bussiere T

## 2016-08-26 NOTE — Progress Notes (Signed)
Echocardiogram 2D Echocardiogram with 2mL Definity has been performed.  08/26/2016 4:55 PM Gertie FeyMichelle Gamble Enderle, BS, RVT, RDCS, RDMS

## 2016-08-26 NOTE — Progress Notes (Signed)
Date: 08/26/2016  Patient name: Barry Nichols  Medical record number: [id number]  Date of birth: 04-30-1975   I have seen and evaluated Barry Nichols and discussed their care with the Residency Team. [name] is a 41 year old man who presented with reduced level of consciousness and found to be hypoglycemic with a glucose of 29. His hypoglycemia was treated and his reduced consciousness resolved. I have read Dr. Shanda Bumps H&P and the only correction I have is that he did not restart his medications including blood pressure medications and sulfonylurea until the morning of the seventh.  This morning, the patient feels well and feels that his baseline. Nephrology had earlier discussed dialysis and access. He is still uncertain as to what course he wants to take. He asked questions about the change in his renal function from February until admission and from yesterday and today. He was last hypoglycemic this morning at about 2:20 AM.  PMHx, Fam Hx, and/or Soc Hx : Past medical history is significant for HTN, hypertensive systolic heart cardiomyopathy, CKD stage V, and gout. Family history is significant for diabetes and hypertension. 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  Vitals:   08/26/16 0524 08/26/16 1000  BP: (!) 159/89 (!) 153/91  Pulse: 91 86  Resp: 18 18  Temp: 98.6 F (37 C) 98.2 F (36.8 C)  Obese, NAD HRRR distant heart sounds, no MRG L distant but appears to be CTAB Skin warm and dry, multiple tattoos Ext + 1 edema B Neuro moving all 4  GFR 8 K 4.0 CO2 22 HgB 7.7 % sat 12 Urine 100 protein  I personally viewed his CXR images and confirmed by reading with the official read. Cardiomegaly but AP  I personally viewed his EKG and confirmed by reading with the official read. Sinus, nl axis, inf lateral TWI  Assessment and Plan: I have seen and evaluated the patient as outlined above. I agree with the formulated Assessment and Plan as detailed  in the residents' note, with the following changes:[name] is a 41 year old man with stage V CKD. The etiology of his CKD long-standing hypertension. He has attempted to treat his medical illnesses with lifestyle changes and holistic medicine clinic clinic in [place]. Unfortunately, his kidney disease has progressed and he is on the verge of requiring dialysis. He is hesitant to agree to obtain hemodialysis access her start dialysis at this time. Fortunately, he does not need emergent hemodialysis at this moment. However it is only a matter of time before he will require hemodialysis. His symptoms which started yesterday can be attributed to the glyburide that he took starting yesterday. We have explained that his kidneys can no longer clear this medication and he shouldn't longer to get. The symptoms which he has been having over the past month are less clear but could be due to uncontrolled hypertension or uremia. He is hesitant to accept typical medical care including dialysis.  1. Hypoglycemia - we will instruct the patient not to take anymore of the sulfonylurea at discharge. A1c's are not accurate in renal failure and he will need to follow his sugar with CBG's as at some point he had been diagnosed with diabetes. The half-life of the glyburide will be prolonged with his degree of renal failure and we will need to ensure that he has no more ongoing hypoglycemia before discharge.   2. Acute on chronic kidney disease - nephrology is working closely with the patient to gauge his acceptance of  the diagnosis and need for dialysis. Dr Nelson ChimesAmin  ensured that the patient understands the risk of not starting dialysis.  3. Hypertensive systolic cardiomyopathy - he has no central volume overload but clearly has peripheral edema. Nephrology is attempting diuresis with PO Lasix. We will follow I/O and wts.   4. Malignant hypertension - due to uncontrolled blood pressure over quite some time, he is now has  chronic kidney disease. His home medications of amlodipine and  Coreg have been continued. His clonidine and Lasix doses have been increased. His blood pressure has come down a bit but is still not yet at goal of < 130/80.  [name], [name], MD 5/9/20182:15 PM

## 2016-08-26 NOTE — Progress Notes (Signed)
Subjective: Patient was feeling better when seen this morning. He was stating that he is back to his baseline. He also admits that he used glyburide twice once on Monday morning the first time and then repeated again on Tuesday morning.  Objective:  Vital signs in last 24 hours: Vitals:   08/25/16 1745 08/25/16 2033 08/26/16 0524 08/26/16 1000  BP:  (!) 178/99 (!) 159/89 (!) 153/91  Pulse:  (!) 107 91 86  Resp:  20 18 18   Temp:  98.9 F (37.2 C) 98.6 F (37 C) 98.2 F (36.8 C)  TempSrc:  Oral Oral Oral  SpO2:  100% 100% 100%  Weight: 266 lb 12.1 oz (121 kg) 266 lb 12.1 oz (121 kg)    Height: 5\' 10"  (1.778 m)      Gen. Well-developed, obese gentleman, in no acute distress. Lungs. Clear bilaterally CV. Mild tachycardia with regular rhythm. Abdomen. Soft, nontender, obese, bowel sounds positive. Extremities. 2+ pitting edema up to knees, pulses intact and symmetrical bilaterally.  Labs. CBC Latest Ref Rng & Units 08/26/2016 08/25/2016 07/13/2012  WBC 4.0 - 10.5 K/uL 5.9 6.1 3.6(L)  Hemoglobin 13.0 - 17.0 g/dL 7.7(L) 8.1(L) 12.9(L)  Hematocrit 39.0 - 52.0 % 23.4(L) 25.0(L) 36.1(L)  Platelets 150 - 400 K/uL 238 239 231   Renal function panel  Order: [id number]  Status:  Final result Visible to patient:  No (Not Released) Next appt:  09/16/2016 at 03:30 PM in Neurology [facility](ATHAR, Ladell HeadsSAIMA, MD)    Ref Range & Units 03:15 (08/26/16) 1d ago (08/25/16) 1d ago (08/25/16) 8579yr ago (07/13/12) 9079yr ago (09/14/11) 5879yr ago (09/13/11)   Sodium 135 - 145 mmol/L 139   140  142R  135R  137R    Potassium 3.5 - 5.1 mmol/L 4.0   4.2  3.3R   4.1R  4.0R, CM    Chloride 101 - 111 mmol/L 103   106  102R  101R  102R    CO2 22 - 32 mmol/L 22   22  28R  25R  25R    Glucose, Bld 65 - 99 mg/dL 829110    56OZ40CM   308M139R   578I124R   121R     BUN 6 - 20 mg/dL 55    51   69G43R   29B30R   27R     Creatinine, Ser 0.61 - 1.24 mg/dL 2.849.05    1.328.86   4.40N2.63R   2.71R   2.60R     Calcium 8.9 - 10.3 mg/dL 8.0    8.6   0.2V9.4R  2.5D9.1R  8.9R    Phosphorus 2.5 - 4.6 mg/dL 6.0   3.9        Albumin 3.5 - 5.0 g/dL 2.9    3.2        GFR calc non Af Amer >60 mL/min 6    7   30R   29R   30R     GFR calc Af Amer >60 mL/min 8    8CM   35R, CM   34R, CM   35R, CM         C-peptide. 24.6 Random insulin. 86.4  Assessment/Plan:  Barry BottomAlvin A Witherspoonis a 40 y.o.man with PMHx significant for hypertension, hypertensive cardiomyopathy, CKD stage V, and gout came to ED after feeding himself dizzy and diaphoretic today. He was found to have blood sugar in 20s.  Hypoglycemia.  Last episode of hypoglycemia was around 2:25 AM. Most likely because of his recent use of glyburide with renal  failure, as sulfonylurea will take longer time to clear in the setting of renal disease. His random insulin and C-peptide were elevated-that can be explained with his worsening renal failure and inability to clear endogenous insulin. He was advised not to take those medications anymore. We will keep monitoring CBG.  AKI On CKD  V. Nephrology is following, they are recommending the start of hemodialysis. Patient needs more time to decide on that. We discussed the risk and benefits of hemodialysis, including risk of worsening renal function to the point which can result in death if he decided not to take that route and continue to work on other approaches including dietary modifications and other holistic approach to help with his renal function. We advised him to make a informed decision. He seems understanding. If he agrees then he might start dialysis during this visit. If he decided not to start dialysis he can be discharge with a close follow-up with nephrology.  Mildly elevated troponin. Troponin plateaued at 0.06. He denies any chest pain or shortness of breath. -Repeat Echo is still pending.  Hypertension. His blood pressure remained elevated. Nephrology is managing his hypertension and we really appreciate their accommodations. -We will continue his current  dose of Lasix 160 mg twice daily, amlodipine 10 mg daily, clonidine 0.3 mg 3 times a day and Coreg 25 mg twice daily.  Volume overload with history of HFrEF. He still exhibited 2+ pitting lower extremity edema. Chest remained clear. -Continue Lasix 160 mg twice daily. -Continue Coreg 25 mg twice daily  -Repeat echo results pending.  Worsening anemia. His iron studies shows iron deficiency along with anemia of chronic illness. Most likely dietary because of his recent change in diet since January 2018. -Nephrology is giving him Feraheme.  History of gout. No sign of acute exacerbation. -Hold his home dose of colchicine.  Dispo: Anticipated discharge in approximately 1-2 day(s).   Arnetha Courser, MD 08/26/2016, 3:59 PM Pager: [phone]

## 2016-08-26 NOTE — Progress Notes (Signed)
Subjective: Barry Nichols has no current complaints, and reports that all of his symptoms have improved significantly. He is still unwilling to begin dialysis after speaking with Nephrology, and is particularly concerned regarding the time commitment. Pt acknowledges taking Glyburide for the first time this past Monday, and understands that this likely precipitated his symptoms; he confirms that he will not take Glyburide in the future.   Objective: Vital signs in last 24 hours: Vitals:   08/25/16 1745 08/25/16 2033 08/26/16 0524 08/26/16 1000  BP:  (!) 178/99 (!) 159/89 (!) 153/91  Pulse:  (!) 107 91 86  Resp:  20 18 18   Temp:  98.9 F (37.2 C) 98.6 F (37 C) 98.2 F (36.8 C)  TempSrc:  Oral Oral Oral  SpO2:  100% 100% 100%  Weight: 121 kg (266 lb 12.1 oz) 121 kg (266 lb 12.1 oz)    Height: 5\' 10"  (1.778 m)      Weight change:   Intake/Output Summary (Last 24 hours) at 08/26/16 1247 Last data filed at 08/26/16 1100  Gross per 24 hour  Intake           1147.5 ml  Output              750 ml  Net            397.5 ml   BP (!) 153/91 (BP Location: Right Arm)   Pulse 86   Temp 98.2 F (36.8 C) (Oral)   Resp 18   Ht 5\' 10"  (1.778 m)   Wt 121 kg (266 lb 12.1 oz)   SpO2 100%   BMI 38.28 kg/m   General Appearance:    Alert, cooperative, no distress, appears stated age  Head:    Normocephalic, without obvious abnormality, atraumatic  Lungs:     Clear to auscultation bilaterally, respirations unlabored  Chest wall:    No tenderness or deformity  Heart:    Regular rate and rhythm, S1 and S2 normal, no murmur, rub   or gallop  Abdomen:     Soft, non-tender, bowel sounds active all four quadrants  Extremities:   Extremities atraumatic, no cyanosis. Bilateral 2+ pitting edema to knees, slightly improved from yesterday.   Skin:   Skin color, texture, turgor normal, no rashes or lesions   Lab Results:  CBC    Component Value Date/Time   WBC 5.9 08/26/2016 0315   RBC 2.69 (L)  08/26/2016 0315   HGB 7.7 (L) 08/26/2016 0315   HCT 23.4 (L) 08/26/2016 0315   PLT 238 08/26/2016 0315   MCV 87.0 08/26/2016 0315   MCH 28.6 08/26/2016 0315   MCHC 32.9 08/26/2016 0315   RDW 14.6 08/26/2016 0315   LYMPHSABS 0.7 08/25/2016 1056   MONOABS 0.3 08/25/2016 1056   EOSABS 0.1 08/25/2016 1056   BASOSABS 0.0 08/25/2016 1056   BMP Latest Ref Rng & Units 08/26/2016 08/25/2016 07/13/2012  Glucose 65 - 99 mg/dL 829(F110(H) 62(ZH40(LL) 086(V139(H)  BUN 6 - 20 mg/dL 78(I55(H) 69(G51(H) 29(B43(H)  Creatinine 0.61 - 1.24 mg/dL 2.84(X9.05(H) 3.24(M8.86(H) 0.10(U2.63(H)  Sodium 135 - 145 mmol/L 139 140 142  Potassium 3.5 - 5.1 mmol/L 4.0 4.2 3.3(L)  Chloride 101 - 111 mmol/L 103 106 102  CO2 22 - 32 mmol/L 22 22 28   Calcium 8.9 - 10.3 mg/dL 8.0(L) 8.6(L) 9.4    Medications:  Scheduled Meds: . amLODipine  10 mg Oral QHS  . calcitRIOL  0.5 mcg Oral Daily  . carvedilol  25 mg Oral BID  WC  . cloNIDine  0.3 mg Oral TID  . dextrose      . furosemide  160 mg Oral BID  . heparin  5,000 Units Subcutaneous Q8H  . sodium chloride flush  3 mL Intravenous Q12H   Continuous Infusions: . dextrose 10 mL/hr at 08/26/16 0035  . ferumoxytol     PRN Meds:.acetaminophen **OR** acetaminophen, senna-docusate   Assessment/Plan: [name] is a 41 yo M with a PMH of CKD, HTN and CHF who presented yesterday (08/25/16) with reduced level of consciousness suggestive of hypoglycemia in context of renal failure.  CKD: Cr up to 9.1 from 8.86 yesterday; last Cr 4.1 on 06/07/16. Symptoms of uremia present. Bilateral 2+ pitting edema still present, but less severe than yesterday. Pt has been informed repeatedly that he has progressing CKD, and has been made aware of the danger of continually refusing dialysis, but he is unwilling to begin dialysis at this time. Nephrology feels him to be in denial of his CKD diagnosis.  -Nephrology to speak with pt again today and show videos pertaining to dialysis; may establish access today (without initiating  dialysis) if pt is willing.  -Repeat CMP -If pt refuses access/dialysis, he may be discharged today with close f/u w/ nephrology.  -Consider outpatient psych support for coping with CKD diagnosis.  HTN: BP 168/86 on admission, 159/89 today. Pt's prescribed home antihypertensives include amlodipine 10 mg daily, Coreg 25 mg twice daily, clonidine 0.2 mg 3 times a day, Lasix 80 mg twice daily, metolazone 2.5 mg 3 times a day and minoxidil 10 mg twice daily; he has a history of noncompliance, and endorses that he takes on average 15/21 prescribed weekly doses. Nephrology is following, and recommends the following regimen: -Lasix 160 mg twice daily. -Amlodipine 10 mg daily -Clonidine 0.3 mg 3 times a day. -Continue Coreg 25 mg twice daily.  Hypoglycemia: cap glucose 29 on admission; sx improved with food and juice/soda in ED. While pt initially denied any use of insulin or oral hypoglycemics, Dr. Briant Cedar reported last night (5/8) that pt was prescribed glyburide in 2017 and took his first dose this past Monday (5/7) morning; in context of lower clearance rate due to pt's worsening kidney function, this is a likely explanation for pt's severe hypoglycemia upon presentation.   -Continue to monitor CBG for further hypoglycemia. -Spoke w/ pt regarding glyburide use; he has confirmed that he will not use it in the future.   Volume overload / HFrEF: 2013 echo shows reduced EF at 35-40% w/ diffuse hypokinesia and diastolic function. Pt denies any dyspnea, chest pain, or other symptoms of CHF exacerbation. LE edema is likely due to worsening CKD vs. CHF.  -Repeat echo -Continue lasix 160 mg twice/day -Continue home dose of Coreg 25 mg/day  This is a Psychologist, occupational Note.  The care of the patient was discussed with Dr. Arnetha Courser and the assessment and plan formulated with their assistance.  Please see their attached note for official documentation of the daily encounter.   LOS: 1 day   Kern Reap, Medical Student 08/26/2016, 12:47 PM

## 2016-08-26 NOTE — Progress Notes (Addendum)
Inpatient Diabetes Program Recommendations  AACE/ADA: New Consensus Statement on Inpatient Glycemic Control (2015)  Target Ranges:  Prepandial:   less than 140 mg/dL      Peak postprandial:   less than 180 mg/dL (1-2 hours)      Critically ill patients:  140 - 180 mg/dL   Results for Margy ClarksWITHERSPOON, Damain A (MRN [id number]) as of 08/26/2016 09:15  Ref. Range 08/26/2016 00:34 08/26/2016 02:25 08/26/2016 02:57 08/26/2016 05:28 08/26/2016 08:28  Glucose-Capillary Latest Ref Range: 65 - 99 mg/dL 88 51 (L) 657129 (H) 92 95   Review of Glycemic Control  Diabetes history: None   Inpatient Diabetes Program Recommendations:    May also consider A1c level to assess glucose control over the past 2-3 months due to the past prescribing of glyburide and hyperglycemia in the past. Glyburide contraindicated in kidney disease.   Thanks,  Christena DeemShannon Makinzey Banes RN, MSN, [facility] Inpatient Diabetes Coordinator Team Pager [phone] (8a-5p)

## 2016-08-27 ENCOUNTER — Inpatient Hospital Stay (HOSPITAL_COMMUNITY): Payer: BLUE CROSS/BLUE SHIELD

## 2016-08-27 DIAGNOSIS — I5032 Chronic diastolic (congestive) heart failure: Secondary | ICD-10-CM

## 2016-08-27 DIAGNOSIS — Z9119 Patient's noncompliance with other medical treatment and regimen: Secondary | ICD-10-CM

## 2016-08-27 DIAGNOSIS — Z992 Dependence on renal dialysis: Secondary | ICD-10-CM

## 2016-08-27 DIAGNOSIS — N186 End stage renal disease: Secondary | ICD-10-CM

## 2016-08-27 DIAGNOSIS — D631 Anemia in chronic kidney disease: Secondary | ICD-10-CM

## 2016-08-27 DIAGNOSIS — N185 Chronic kidney disease, stage 5: Secondary | ICD-10-CM

## 2016-08-27 DIAGNOSIS — D509 Iron deficiency anemia, unspecified: Secondary | ICD-10-CM

## 2016-08-27 LAB — CBC
HEMATOCRIT: 24.2 % — AB (ref 39.0–52.0)
HEMOGLOBIN: 7.8 g/dL — AB (ref 13.0–17.0)
MCH: 28.3 pg (ref 26.0–34.0)
MCHC: 32.2 g/dL (ref 30.0–36.0)
MCV: 87.7 fL (ref 78.0–100.0)
Platelets: 246 10*3/uL (ref 150–400)
RBC: 2.76 MIL/uL — ABNORMAL LOW (ref 4.22–5.81)
RDW: 14.6 % (ref 11.5–15.5)
WBC: 4.1 10*3/uL (ref 4.0–10.5)

## 2016-08-27 LAB — GLUCOSE, CAPILLARY
GLUCOSE-CAPILLARY: 132 mg/dL — AB (ref 65–99)
GLUCOSE-CAPILLARY: 176 mg/dL — AB (ref 65–99)
Glucose-Capillary: 188 mg/dL — ABNORMAL HIGH (ref 65–99)
Glucose-Capillary: 208 mg/dL — ABNORMAL HIGH (ref 65–99)

## 2016-08-27 LAB — RENAL FUNCTION PANEL
ALBUMIN: 2.8 g/dL — AB (ref 3.5–5.0)
ANION GAP: 12 (ref 5–15)
BUN: 56 mg/dL — ABNORMAL HIGH (ref 6–20)
CO2: 23 mmol/L (ref 22–32)
Calcium: 8.7 mg/dL — ABNORMAL LOW (ref 8.9–10.3)
Chloride: 102 mmol/L (ref 101–111)
Creatinine, Ser: 9.43 mg/dL — ABNORMAL HIGH (ref 0.61–1.24)
GFR calc Af Amer: 7 mL/min — ABNORMAL LOW (ref >60)
GFR, EST NON AFRICAN AMERICAN: 6 mL/min — AB (ref >60)
Glucose, Bld: 189 mg/dL — ABNORMAL HIGH (ref 65–99)
PHOSPHORUS: 6.7 mg/dL — AB (ref 2.5–4.6)
POTASSIUM: 4.1 mmol/L (ref 3.5–5.1)
Sodium: 137 mmol/L (ref 135–145)

## 2016-08-27 LAB — PARATHYROID HORMONE, INTACT (NO CA): PTH: 371 pg/mL — ABNORMAL HIGH (ref 15–65)

## 2016-08-27 MED ORDER — CLONIDINE HCL 0.3 MG PO TABS: 0.3000 mg | ORAL_TABLET | Freq: Three times a day (TID) | ORAL | 0 refills | Status: DC

## 2016-08-27 MED ORDER — FUROSEMIDE 80 MG PO TABS: 160.0000 mg | ORAL_TABLET | Freq: Two times a day (BID) | ORAL | 0 refills | Status: DC

## 2016-08-27 NOTE — Progress Notes (Signed)
Subjective: Pt feels well and says that he is ready to go home. He has been considering dialysis since speaking with nephrology and IM teams yesterday, and has requested more time to decide. His main concern is that he is being forced into dialysis without having been given an adequate chance to manage his condition medically. We have spoken with him and explained the rationale behind dialysis and its role in his treatment.   Objective: Vital signs in last 24 hours: Vitals:   08/26/16 1000 08/26/16 1817 08/26/16 2012 08/27/16 0400  BP: (!) 153/91 (!) 150/88 (!) 149/79 (!) 144/79  Pulse: 86  87 80  Resp: 18  18 18   Temp: 98.2 F (36.8 C) 98 F (36.7 C) 97.9 F (36.6 C) 98.4 F (36.9 C)  TempSrc: Oral Oral Oral Oral  SpO2: 100% 100% 97% 100%  Weight:   121.2 kg (267 lb 3.2 oz)   Height:       Weight change: 0.201 kg (7.1 oz)  Intake/Output Summary (Last 24 hours) at 08/27/16 0857 Last data filed at 08/27/16 0600  Gross per 24 hour  Intake              970 ml  Output             2700 ml  Net            -1730 ml   BP (!) 164/93 (BP Location: Right Arm)   Pulse 86   Temp 98.5 F (36.9 C) (Oral)   Resp 18   Ht 5\' 10"  (1.778 m)   Wt 121.2 kg (267 lb 3.2 oz)   SpO2 100%   BMI 38.34 kg/m   General Appearance:    Alert, cooperative, no distress, appears stated age  Head:    Normocephalic, without obvious abnormality, atraumatic  Eyes:    PERRL, conjunctiva/corneas clear, EOM's intact.  Lungs:     Clear to auscultation bilaterally, respirations unlabored  Chest wall:    No tenderness or deformity  Heart:    Regular rate and rhythm, S1 and S2 normal, no murmur, rub   or gallop  Extremities:   1+ LE pitting edema bilaterally, improved from yetsterday. Extremities otherwise normal, atraumatic, no cyanosis.  Pulses:   2+ and symmetric all extremities  Skin:   Skin color, texture, turgor normal, no rashes or lesions  Neurologic:   CNII-XII intact. Normal strength, sensation and  reflexes      throughout   Lab Results:  CBC    Component Value Date/Time   WBC 4.1 08/27/2016 0357   RBC 2.76 (L) 08/27/2016 0357   HGB 7.8 (L) 08/27/2016 0357   HCT 24.2 (L) 08/27/2016 0357   PLT 246 08/27/2016 0357   MCV 87.7 08/27/2016 0357   MCH 28.3 08/27/2016 0357   MCHC 32.2 08/27/2016 0357   RDW 14.6 08/27/2016 0357   LYMPHSABS 0.7 08/25/2016 1056   MONOABS 0.3 08/25/2016 1056   EOSABS 0.1 08/25/2016 1056   BASOSABS 0.0 08/25/2016 1056   BMP Latest Ref Rng & Units 08/27/2016 08/26/2016 08/25/2016  Glucose 65 - 99 mg/dL 409(W) 119(J) 47(WG)  BUN 6 - 20 mg/dL 95(A) 21(H) 08(M)  Creatinine 0.61 - 1.24 mg/dL 5.78(I) 6.96(E) 9.52(W)  Sodium 135 - 145 mmol/L 137 139 140  Potassium 3.5 - 5.1 mmol/L 4.1 4.0 4.2  Chloride 101 - 111 mmol/L 102 103 106  CO2 22 - 32 mmol/L 23 22 22   Calcium 8.9 - 10.3 mg/dL 4.1(L) 2.4(M) 0.1(U)  Micro Results: No results found for this or any previous visit (from the past 240 hour(s)). Studies/Results: Dg Chest 2 View  Result Date: 08/25/2016 CLINICAL DATA:  New onset of shortness of breath, dizziness, and confusion since earlier today. History of hypertension but no other cardiopulmonary history. EXAM: CHEST  2 VIEW COMPARISON:  Chest x-ray of July 13, 2012 FINDINGS: The lungs are reasonably well inflated. There is no focal infiltrate. There is no pleural effusion. The cardiac silhouette is enlarged though this is not a new finding. The pulmonary vascularity is mildly prominent centrally. The bony thorax is unremarkable. IMPRESSION: Cardiomegaly with mild central pulmonary vascular prominence. No pulmonary edema or pneumonia. Electronically Signed   By: David  SwazilandJordan M.D.   On: 08/25/2016 13:27   Medications:  Scheduled Meds: . amLODipine  10 mg Oral QHS  . calcitRIOL  0.5 mcg Oral Daily  . carvedilol  25 mg Oral BID WC  . cloNIDine  0.3 mg Oral TID  . furosemide  160 mg Oral BID  . heparin  5,000 Units Subcutaneous Q8H  . sodium chloride  flush  3 mL Intravenous Q12H   Continuous Infusions: . dextrose 10 mL/hr at 08/26/16 0035  . ferumoxytol Stopped (08/26/16 1311)   PRN Meds:.acetaminophen **OR** acetaminophen, senna-docusate Assessment/Plan:  [name] is a 41 yo M with a PMH of CKD, HTN and CHF who presented on 08/25/16 with reduced level of consciousness suggestive of hypoglycemia in context of renal failure.  CKD: Cr up to 9.43 from 9.05 yesterday; Cr 4.1 on 06/07/16. Bilateral 1+ pitting edema still present, but improved from yesterday. Pt has been informed repeatedly that he has progressing CKD, and has been made aware of the danger of continually refusing dialysis in favor of dietary management; today, after speaking with nephrology and IM teams, he appears more willing to consider dialysis as an option, but has requested some more time to decide. He has agreed to vein mapping / AV fistula placement.  -Nephrology calling vascular consult for AV fistula placement -Ready for discharge with close f/u by nephrology.  -Consider outpatient psych support for coping with CKD diagnosis.  HTN: BP 168/86 on admission, 164/93. Pt's prescribed home antihypertensives include amlodipine 10 mg daily, Coreg 25 mg twice daily, clonidine 0.2 mg 3 times a day, Lasix 80 mg twice daily, metolazone 2.5 mg 3 times a day and minoxidil 10 mg twice daily; he has a history of noncompliance, and endorses that he takes on average 15/21 prescribed weekly doses.  -Outpatient f/u with cardiologist -Continue regimen recommended by nephrology: Lasix 160 mg twice daily, Amlodipine 10 mg daily, Clonidine 0.3 mg 3 times daily, Coreg 25 mg twice daily.  Hypoglycemia: glucose up to 189 today from 110 yesterday; hypoglycemia on admission likely due to glyburide use in the setting of acute-on-chronic CKD.   -Spoke w/ pt regarding glyburide use; he has confirmed that he will not use it in the future.   Volume overload / HFrEF: 2013 echo showed reduced EF  at 35-40% w/ diffuse hypokinesia and diastolic functio; repeat echo shows normalization at 60-65% with G2DF. Pt denies any dyspnea, chest pain, or other symptoms of CHF exacerbation. LE edema is likely due to worsening CKD vs. CHF.  -Repeat echo -Continue lasix 160 mg twice/day -Continue Coreg 25 mg/day  Pulm HTN: Repeat echo demonstrated moderate pulmonary HTN w/ PA peak pressure of 65, increased from 47 on 2013 echo. Pt reports that he will be receiving a sleep study in May 2018; treatment of  OSA should significantly help with progressive pulm HTN.  -Outpatient f/u with cardiologist for possible right heart cath to identify other causes of pulm HTN  This is a Psychologist, occupational Note.  The care of the patient was discussed with Dr. Arnetha Courser and the assessment and plan formulated with their assistance.  Please see their attached note for official documentation of the daily encounter.   LOS: 2 days   Kern Reap, Medical Student 08/27/2016, 8:57 AM

## 2016-08-27 NOTE — Progress Notes (Signed)
Right  Upper Extremity Vein Map  Basilic  Segment Diameter Depth Comment  1. Axilla 13 mm 12.6 mm   2. Mid upper arm 8.4 mm 13 mm   3. Above AC 8.1 mm 6.9 mm   4. In AC 3.6 mm 5.9 mm   5. Below AC 2.8 mm 1.8 mm   6. Mid forearm 2 mm 2.3 mm  Branch  7. Wrist   2.7 mm 2.7 mm     Cephalic  Segment Diameter Depth Comment  1. Axilla 0.1 mm 6.5 mm   2. Mid upper arm 1 mm 5.2 mm   3. Above AC 2 mm 4.4 mm   4. [facility] mm 4.2 mm   5. Below AC 4.1 mm 5.6 mm   6. Mid forearm 4.2 mm 3.3 mm  Branch  7. Wrist 3.1 mm 3.1 mm     Left Upper Extremity Vein Map  Basilic  Segment Diameter Depth Comment  1. Axilla 7.9 mm 15.4 mm   2. Mid upper arm 8.4 mm 9 mm  Branch  3. Above AC 4.4 mm 5 mm   4. In AC 4.1 mm 3.5 mm   5. Below AC 3.1 mm 3.5 mm  Branch  6. Mid forearm 3.1 mm 4 mm   7. Wrist 2.3 mm 4 mm  Branch    Cephalic  Segment Diameter Depth Comment  1. Axilla 0.8 mm 6.7 mm   2. Mid upper arm 1.3 mm 5.8 mm   3. Above AC 1.3 mm 3.4 mm   4. In AC 4.8 mm 5.7 mm   5. Below AC 3.5 mm 5.5 mm  Branch  6. Mid forearm 3.6 mm 4.6 mm   7. Wrist 2.7 mm 4.8 mm  Branch

## 2016-08-27 NOTE — Progress Notes (Signed)
Subjective: Patient had no complaints today. He still needs more time to decide about dialysis. He wants to be discharged home and work on his lifestyle modifications and dietary changes to see if he can reverse any of his kidney functions. He and his wife both have some concern that medical industry is pushing them for dialysis and once he will be there and they will not let him go off the dialysis. We tried our best to explain to him the procedure and the condition of his kidneys and why it is very unlikely that anything can reverse his renal function at this time.  Patient still wants to give his approach to try.  Objective:  Vital signs in last 24 hours: Vitals:   08/26/16 2012 08/27/16 0400 08/27/16 0900 08/27/16 1122  BP: (!) 149/79 (!) 144/79 (!) 181/101 (!) 164/93  Pulse: 87 80 83 86  Resp: 18 18  18   Temp: 97.9 F (36.6 C) 98.4 F (36.9 C)  98.5 F (36.9 C)  TempSrc: Oral Oral  Oral  SpO2: 97% 100%  100%  Weight: 267 lb 3.2 oz (121.2 kg)     Height:       Gen. Well-developed, obese gentleman, in no acute distress. CV. Marland Kitchen Regular rate and rhythm. Lungs. Clear bilaterally. Extremities. 1+ pitting edema up to below knees, significantly improved as compared to yesterday. Pulses 2+ bilaterally.  Labs. CBC Latest Ref Rng & Units 08/27/2016 08/26/2016 08/25/2016  WBC 4.0 - 10.5 K/uL 4.1 5.9 6.1  Hemoglobin 13.0 - 17.0 g/dL 7.8(L) 7.7(L) 8.1(L)  Hematocrit 39.0 - 52.0 % 24.2(L) 23.4(L) 25.0(L)  Platelets 150 - 400 K/uL 246 238 239   Renal function panel  Order: [id number]  Status:  Final result Visible to patient:  No (Not Released) Next appt:  09/16/2016 at 03:30 PM in Neurology Frances Furbish, Ladell Heads, MD)    Ref Range & Units 03:57 (08/27/16) 1d ago (08/26/16) 2d ago (08/25/16) 2d ago (08/25/16) 23yr ago (07/13/12) 107yr ago (09/14/11)   Sodium 135 - 145 mmol/L 137  139   140  142R  135R    Potassium 3.5 - 5.1 mmol/L 4.1  4.0   4.2  3.3R   4.1R    Chloride 101 - 111 mmol/L 102  103   106   102R  101R    CO2 22 - 32 mmol/L 23  22   22   28R  25R    Glucose, Bld 65 - 99 mg/dL 409   811    91YN   829F   124R     BUN 6 - 20 mg/dL 56   55    51   62Z   30Q     Creatinine, Ser 0.61 - 1.24 mg/dL 6.57   8.46    9.62   9.52W   2.71R     Calcium 8.9 - 10.3 mg/dL 8.7   8.0    8.6   4.1L  9.1R    Phosphorus 2.5 - 4.6 mg/dL 6.7   6.0   3.9       Albumin 3.5 - 5.0 g/dL 2.8   2.9    3.2       GFR calc non Af Amer >60 mL/min 6   6    7    30R   29R     GFR calc Af Amer >60 mL/min 7   8CM    8CM   35R, CM   34R, CM  Assessment/Plan:  Barry Darnerlvin A Witherspoonis a 41 y.o.man with PMHx significant for hypertension,hypertensive cardiomyopathy,CKD stage V,and gout came to ED after feeding himself dizzy and diaphoretic today.He was found to have blood sugar in20s.  Hypoglycemia. Resolved. His CBGs remained between 112-208 over last 24 hour. He should not be taking glyburide anymore.  AKI On CKD V. Patient still needs some time to decide on dialysis. He agreed for vein mapping and fistula placement. Vascular consult was made by nephrology. He can be discharged after this appointment with a close follow-up with nephrology.  HFpEF. His repeat echo shows improvement in his ejection fraction from previous echo. They have been normalized at 60-65% with grade 2 diastolic dysfunction. His echo was also positive for moderate pulmonary hypertension with pulmonary artery peak pressures of 65, which was increased from his previous echo reading of 47. His lower extremity edema is improving with diuresis . -Continue Lasix 160 mg twice daily. -Continue Coreg 25 mg twice daily   Pulmonary hypertension.Most likely secondary to his long-standing obstructive sleep apnea .Patient is going for sleep study at the and of May 2018. Treating his sleep apnea should be able to help him with progressive pulmonary hypertension. He might need right heart catheterization to establish any treatable causes. His  cardiologist should be able to take care of that as an outpatient.  Hypertension. Improving but still remains elevated. I talked with nephrology and decided to keep him on his current regimen which includes Lasix, clonidine, Coreg and amlodipine. We will monitor his blood pressure as an outpatient and had more medication as needed.  Iron deficiency along with anemia of chronic illness. He was started on Feraheme by nephrology.  History of gout.No sign of acute exacerbation. -Hold his home dose of colchicine.  Dispo: Can be discharged home after vein mapping today.   Arnetha CourserAmin, Tyrez Berrios, MD 08/27/2016, 11:24 AM Pager: [phone]

## 2016-08-27 NOTE — Progress Notes (Signed)
S: Still has not seen video tapes.  He is eating O:BP (!) 181/101 (BP Location: Left Arm) Comment: gave Coreg will alert MD   Pulse 83   Temp 98.4 F (36.9 C) (Oral)   Resp 18   Ht 5\' 10"  (1.778 m)   Wt 121.2 kg (267 lb 3.2 oz)   SpO2 100%   BMI 38.34 kg/m   Intake/Output Summary (Last 24 hours) at 08/27/16 0957 Last data filed at 08/27/16 0600  Gross per 24 hour  Intake              970 ml  Output             2700 ml  Net            -1730 ml   Weight change: 0.201 kg (7.1 oz) ZOX:WRUEA and alert CVS:RRR no rub Resp:Clear Abd:+ BS NTND Ext: 1+ edema NEURO:CNI Ox3 no asterixis   . amLODipine  10 mg Oral QHS  . calcitRIOL  0.5 mcg Oral Daily  . carvedilol  25 mg Oral BID WC  . cloNIDine  0.3 mg Oral TID  . furosemide  160 mg Oral BID  . heparin  5,000 Units Subcutaneous Q8H  . sodium chloride flush  3 mL Intravenous Q12H   Dg Chest 2 View  Result Date: 08/25/2016 CLINICAL DATA:  New onset of shortness of breath, dizziness, and confusion since earlier today. History of hypertension but no other cardiopulmonary history. EXAM: CHEST  2 VIEW COMPARISON:  Chest x-ray of July 13, 2012 FINDINGS: The lungs are reasonably well inflated. There is no focal infiltrate. There is no pleural effusion. The cardiac silhouette is enlarged though this is not a new finding. The pulmonary vascularity is mildly prominent centrally. The bony thorax is unremarkable. IMPRESSION: Cardiomegaly with mild central pulmonary vascular prominence. No pulmonary edema or pneumonia. Electronically Signed   By: David  [place] M.D.   On: 08/25/2016 13:27   BMET    Component Value Date/Time   NA 137 08/27/2016 0357   K 4.1 08/27/2016 0357   CL 102 08/27/2016 0357   CO2 23 08/27/2016 0357   GLUCOSE 189 (H) 08/27/2016 0357   BUN 56 (H) 08/27/2016 0357   CREATININE 9.43 (H) 08/27/2016 0357   CALCIUM 8.7 (L) 08/27/2016 0357   GFRNONAA 6 (L) 08/27/2016 0357   GFRAA 7 (L) 08/27/2016 0357   CBC    Component  Value Date/Time   WBC 4.1 08/27/2016 0357   RBC 2.76 (L) 08/27/2016 0357   HGB 7.8 (L) 08/27/2016 0357   HCT 24.2 (L) 08/27/2016 0357   PLT 246 08/27/2016 0357   MCV 87.7 08/27/2016 0357   MCH 28.3 08/27/2016 0357   MCHC 32.2 08/27/2016 0357   RDW 14.6 08/27/2016 0357   LYMPHSABS 0.7 08/25/2016 1056   MONOABS 0.3 08/25/2016 1056   EOSABS 0.1 08/25/2016 1056   BASOSABS 0.0 08/25/2016 1056     Assessment:  1. CKD 5 sec HTN 2. HTN 3. HTN cardiomyopathy 4. Hypoglycemia sec glyburide 5. Anemia, Fe def 6. 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 I had another long discussion with pt.  He clearly is not ready to start now but is willing to proceed with vein mapping and VVS consult for AVF to be done in near future.  After that he could be DC with FU with Dr Eliott Nine.  He is aware he is taking a risk of developing uremia and requiring another hospital visit. 2. Spoke to nurse about  showing him video tapes today 3. My office will call pt for FU with Dr Onalee Huaunham   Barry Nichols T

## 2016-08-27 NOTE — Discharge Summary (Signed)
Name: Barry Nichols MRN: [id number] DOB: 1976/04/04 41 y.o. PCP: Clide Dales, PA  Date of Admission: 08/25/2016 10:43 AM Date of Discharge: 08/27/2016 Attending Physician: Burns Spain, MD  Discharge Diagnosis: 1. Hypoglycemia. 2. CKD Stage V.  Discharge Medications: Allergies as of 08/27/2016   No Known Allergies     Medication List    STOP taking these medications   COLCRYS 0.6 MG tablet Generic drug:  colchicine   metolazone 2.5 MG tablet Commonly known as:  ZAROXOLYN   minoxidil 10 MG tablet Commonly known as:  LONITEN   potassium chloride SA 20 MEQ tablet Commonly known as:  K-DUR,KLOR-CON     TAKE these medications   amLODipine 10 MG tablet Commonly known as:  NORVASC Take 1 tablet (10 mg total) by mouth at bedtime.   calcitRIOL 0.25 MCG capsule Commonly known as:  ROCALTROL Take 0.5 mcg by mouth daily.   carvedilol 25 MG tablet Commonly known as:  COREG Take 1 tablet (25 mg total) by mouth 2 (two) times daily with a meal.   cloNIDine 0.3 MG tablet Commonly known as:  CATAPRES Take 1 tablet (0.3 mg total) by mouth 3 (three) times daily. What changed:  medication strength  how much to take   furosemide 80 MG tablet Commonly known as:  LASIX Take 2 tablets (160 mg total) by mouth 2 (two) times daily. What changed:  medication strength  how much to take       Disposition and follow-up:   [name] was discharged from [facility] in Good condition.  At the hospital follow up visit please address:  1.  His blood pressure and titrated his antihypertensive accordingly. -He has worsening renal function and his compliance with his medication. Patient wants some time to decide on dialysis. -He might need a workup for diabetes and if he really needs sure that he started on insulin because of his almost end-stage renal disease.  2.  Labs / imaging needed at time of follow-up: Renal functions  3.   Pending labs/ test needing follow-up: Pro insulin/insulin ratio, sulfonylurea hypoglycemia panel.  Follow-up Appointments:   Hospital Course by problem list:  Barry Nichols a 41 y.o.man with PMHx significant for hypertension,hypertensive cardiomyopathy,CKD stage V,and gout came to ED after feeding himself dizzy and diaphoretic today.He was found to have blood sugar in20s.  Hypoglycemia. On presentation he was found to have blood sugar of 29. His symptoms and blood sugar improved with food. It was found out later on that he took glyburide 2 days in a row which was prescribed many months ago because of his apparent hyperglycemia. Patient never took that medicine before and suddenly decided to restart all of his medications. When he took it on Monday he did feel a little dizzy and diaphoretic and his symptoms improved with food, next day after taking his medication his symptoms got really worse and he was sent home from work and his wife decided to bring him to ED. We told him that because of his worsening renal functions glyburide is not a good choice at this time even if it is needed for his blood sugar control. He does not has a proper diagnosis of diabetes. His PCP can do a workup for a possible diabetes and treat him accordingly. If he needs to be on anything for blood sugar, insulin might be the best choice at this point.  AKI On CKD V. he was found to have worsening renal  function with creatinine about 8 on presentation, his last documented creatinine in February 2018 was 4.1. We had a long discussion about starting dialysis, patient still wants to have some time to decide as he is pretty confident that he can reverse his renal functions with lifestyle modifications and dietary changes. He was explained the benefits and risk off dialysis at this point, he was also told to watch for the some life-threatening symptoms because of his worsening renal function and should seek  immediate medical attention. He agreed to have vain mapping done and will have  AVF placed as an outpatient. He was discharged home with a close follow-up with his nephrologist Dr. Eliott Nineunham.  Hypertension. His blood pressure remained elevated during most of his stay. He has a long-standing history of being noncompliant. It looks like patient had a poor insight of his chronic condition. After discussing with Dr. Briant CedarMattingly from neurology it was decided to discharge him on Lasix 160 mg twice daily, Coreg 25 mg twice daily, clonidine 0.3 mg 3 times a day and amlodipine 10 mg daily. His blood pressure will be monitored during his follow-up visits with nephrology and be treated accordingly.  HFpEF. His repeat echo shows improvement in his ejection fraction from previous echo of 35-40%.They have been normalized at 60-65% with grade 2 diastolic dysfunction. His echo was also positive for moderate pulmonary hypertension with pulmonary artery peak pressures of 65, which was increased from his previous echo reading of 47. His lower extremity edema is improving with diuresis .  Pulmonary hypertension.Most likely secondary to his long-standing obstructive sleep apnea .Patient is going for sleep study at the and of May 2018. Treating his sleep apnea should be able to help him with progressive pulmonary hypertension. He might need right heart catheterization to establish any treatable causes. His cardiologist should be able to take care of that as an outpatient.  Iron deficiency along with anemia of chronic illness. On presentation he was found to have worsening anemia and on iron studies found to have mild iron deficiency. His iron deficiency may be due to his dietary restrictions which he was following since January 2018. He will get benefit if that can be investigated as he developed fecal occult blood testing by his PCP. He was started on failure him by nephrology during his stay in hospital.  History of  gout.No sign of acute exacerbation. We held his home dose of colchicine.  Discharge Vitals:   BP (!) 164/93 (BP Location: Right Arm)   Pulse 86   Temp 98.5 F (36.9 C) (Oral)   Resp 18   Ht 5\' 10"  (1.778 m)   Wt 267 lb 3.2 oz (121.2 kg)   SpO2 100%   BMI 38.34 kg/m   Pertinent Labs, Studies, and Procedures:   CBC  Order: [id number]  Status:  Final result Visible to patient:  No (Not Released) Next appt:  09/16/2016 at 03:30 PM in Neurology [facility](ATHAR, Ladell HeadsSAIMA, MD)    Ref Range & Units 03:57 (08/27/16) 1d ago (08/26/16) 2d ago (08/25/16) 7739yr ago (07/13/12) 6939yr ago (09/14/11) 10839yr ago (09/13/11)   WBC 4.0 - 10.5 K/uL 4.1  5.9  6.1  3.6   6.8  7.1    RBC 4.22 - 5.81 MIL/uL 2.76   2.69   2.88   4.43  4.05   4.03     Hemoglobin 13.0 - 17.0 g/dL 7.8   7.7   8.1   40.912.9   11.7   11.7  HCT 39.0 - 52.0 % 24.2   23.4   25.0   36.1   34.4   34.2     MCV 78.0 - 100.0 fL 87.7  87.0  86.8  81.5  84.9  84.9    MCH 26.0 - 34.0 pg 28.3  28.6  28.1  29.1  28.9  29.0    MCHC 30.0 - 36.0 g/dL 16.1  09.6  04.5  40.9  34.0  34.2    RDW 11.5 - 15.5 % 14.6  14.6  14.4  14.3  14.1  14.1    Platelets 150 - 400 K/uL 246  238  239  231  282  260    Neutrophils Relative %    82  63      Basophils Absolute    0.0  0.0      Neutro Abs    5.1  2.3      Lymphocytes Relative    11  26      Lymphs Abs    0.7  0.9      Monocytes Relative    5  6      Monocytes Absolute    0.3  0.2      Eosinophils Relative    2  5      Eosinophils Absolute    0.1  0.2      Basophils Relative    0  0            Renal function panel  Order: [id number]  Status:  Final result Visible to patient:  No (Not Released) Next appt:  09/16/2016 at 03:30 PM in Neurology Frances Furbish, Ladell Heads, MD)    Ref Range & Units 03:57 (08/27/16) 1d ago (08/26/16) 2d ago (08/25/16) 2d ago (08/25/16) 68yr ago (07/13/12) 52yr ago (09/14/11)   Sodium 135 - 145 mmol/L 137  139   140  142R  135R    Potassium 3.5 - 5.1 mmol/L 4.1  4.0   4.2  3.3R   4.1R     Chloride 101 - 111 mmol/L 102  103   106  102R  101R    CO2 22 - 32 mmol/L 23  22   22   28R  25R    Glucose, Bld 65 - 99 mg/dL 956   213    08MV   784O   124R     BUN 6 - 20 mg/dL 56   55    51   96E   95M     Creatinine, Ser 0.61 - 1.24 mg/dL 8.41   3.24    4.01   0.27O   2.71R     Calcium 8.9 - 10.3 mg/dL 8.7   8.0    8.6   5.3G  9.1R    Phosphorus 2.5 - 4.6 mg/dL 6.7   6.0   3.9       Albumin 3.5 - 5.0 g/dL 2.8   2.9    3.2       GFR calc non Af Amer >60 mL/min 6   6    7    30R   29R     GFR calc Af Amer >60 mL/min 7   8CM    8CM   35R, CM   34R, CM         Iron/TIBC/Ferritin/ %Sat    Component Value Date/Time   IRON 38 (L) 08/25/2016 1620   TIBC 316 08/25/2016 1620   FERRITIN 126 08/25/2016 1620  IRONPCTSAT 12 (L) 08/25/2016 1620   Urinalysis    Component Value Date/Time   COLORURINE STRAW (A) 08/25/2016 1222   APPEARANCEUR CLEAR 08/25/2016 1222   LABSPEC 1.006 08/25/2016 1222   PHURINE 5.0 08/25/2016 1222   GLUCOSEU NEGATIVE 08/25/2016 1222   HGBUR NEGATIVE 08/25/2016 1222   BILIRUBINUR NEGATIVE 08/25/2016 1222   BILIRUBINUR neg 09/24/2012 1059   KETONESUR NEGATIVE 08/25/2016 1222   PROTEINUR 100 (A) 08/25/2016 1222   UROBILINOGEN 0.2 09/24/2012 1059   UROBILINOGEN 0.2 09/11/2011 0843   NITRITE NEGATIVE 08/25/2016 1222   LEUKOCYTESUR NEGATIVE 08/25/2016 1222    CMP Latest Ref Rng & Units 08/27/2016 08/26/2016 08/25/2016  Glucose 65 - 99 mg/dL 161(W) 960(A) 54(UJ)  BUN 6 - 20 mg/dL 81(X) 91(Y) 78(G)  Creatinine 0.61 - 1.24 mg/dL 9.56(O) 1.30(Q) 6.57(Q)  Sodium 135 - 145 mmol/L 137 139 140  Potassium 3.5 - 5.1 mmol/L 4.1 4.0 4.2  Chloride 101 - 111 mmol/L 102 103 106  CO2 22 - 32 mmol/L 23 22 22   Calcium 8.9 - 10.3 mg/dL 4.6(N) 6.2(X) 5.2(W)  Total Protein 6.5 - 8.1 g/dL - - 6.1(L)  Total Bilirubin 0.3 - 1.2 mg/dL - - 0.6  Alkaline Phos 38 - 126 U/L - - 72  AST 15 - 41 U/L - - 20  ALT 17 - 63 U/L - - 19   Parathyroid. 371 C-peptide. 24.6 Random  insulin. 86.4 HIV antibody. Non reactive Hepatitis B surface Ag. Negative TSH. 1.511 A1c. 5.5  EKG: Sinus rhythm with T-wave inversion in leads 2, 3, aVF, V5 and V6-same changes seen in his previous EKG done in 2013.  CXR: FINDINGS: The lungs are reasonably well inflated. There is no focal infiltrate. There is no pleural effusion. The cardiac silhouette is enlarged though this is not a new finding. The pulmonary vascularity is mildly prominent centrally. The bony thorax is unremarkable.  IMPRESSION: Cardiomegaly with mild central pulmonary vascular prominence. No pulmonary edema or pneumonia.  ECHO. (08/26/16) Study Conclusions  - Left ventricle: The cavity size was moderately dilated. Wall  thickness was normal. Systolic function was normal. The estimated  ejection fraction was in the range of 60% to 65%. Wall motion was normal; there were no regional wall motion abnormalities.   Features are consistent with a pseudonormal left ventricular  filling pattern, with concomitant abnormal relaxation and  increased filling pressure (grade 2 diastolic dysfunction). - Aortic valve: There was mild regurgitation. - Mitral valve: There was mild to moderate regurgitation. - Tricuspid valve: There was moderate regurgitation. - Pulmonary arteries: Systolic pressure was moderately to severely increased. PA peak pressure: 65 mm Hg (S). - Pericardium, extracardiac: A trivial pericardial effusion was identified.   Discharge Instructions: Discharge Instructions    Diet - low sodium heart healthy    Complete by:  As directed    Discharge instructions    Complete by:  As directed    It was pleasure taking care of you. We have to make a few changes to your existing medications. Please take them as directed. Please follow-up with your kidney doctor as directed for aggressive control of your blood pressure and further management of your kidney disease.   Increase activity slowly    Complete by:  As  directed       Signed: Arnetha Courser, MD 08/27/2016, 2:24 PM   Pager: [phone]

## 2016-08-27 NOTE — Progress Notes (Signed)
Barry Nichols to be D/C'd Home per MD order.  Discussed prescriptions and follow up appointments with the patient. Prescriptions given to patient, medication list explained in detail. Pt verbalized understanding.  Allergies as of 08/27/2016   No Known Allergies     Medication List    STOP taking these medications   COLCRYS 0.6 MG tablet Generic drug:  colchicine   metolazone 2.5 MG tablet Commonly known as:  ZAROXOLYN   minoxidil 10 MG tablet Commonly known as:  LONITEN   potassium chloride SA 20 MEQ tablet Commonly known as:  K-DUR,KLOR-CON     TAKE these medications   amLODipine 10 MG tablet Commonly known as:  NORVASC Take 1 tablet (10 mg total) by mouth at bedtime.   calcitRIOL 0.25 MCG capsule Commonly known as:  ROCALTROL Take 0.5 mcg by mouth daily.   carvedilol 25 MG tablet Commonly known as:  COREG Take 1 tablet (25 mg total) by mouth 2 (two) times daily with a meal.   cloNIDine 0.3 MG tablet Commonly known as:  CATAPRES Take 1 tablet (0.3 mg total) by mouth 3 (three) times daily. What changed:  medication strength  how much to take   furosemide 80 MG tablet Commonly known as:  LASIX Take 2 tablets (160 mg total) by mouth 2 (two) times daily. What changed:  medication strength  how much to take       Vitals:   08/27/16 1122 08/27/16 1637  BP: (!) 164/93 (!) 174/90  Pulse: 86 83  Resp: 18   Temp: 98.5 F (36.9 C)     Skin clean, dry and intact without evidence of skin break down, no evidence of skin tears noted. IV catheter discontinued intact. Site without signs and symptoms of complications. Dressing and pressure applied. Pt denies pain at this time. No complaints noted. Altered MD of BP, per MD, BP will be elevated due to chronic kidney disease.   An After Visit Summary was printed and given to the patient. Patient escorted via WC, and D/C home via private auto.  Barry RothmanNatalie Almina Schul, RN [facility] Phone [phone]

## 2016-08-27 NOTE — Consult Note (Signed)
CARDIOLOGY CONSULT NOTE  Patient ID: Barry Clarkslvin A Poynor MRN: [id number] DOB/AGE: 1975/09/11 41 y.o.  Admit date: 08/25/2016 Referring Physician  Sumayya  Primary Physician:  Renaye RakersBland, Veita, MD Reason for Consultation  CHF, Hypertension  HPI: Barry Nichols  is a 41 y.o. male  With Patient admitted with altered mental status after he was found to be hypoglycemic after taking oral hypoglycemic agents.  He has history of hypertension with hypertensive heart and renal disease, normal LVEF echocardiogram in our office on 08/14/2016 and again by echocardiogram done yesterday on 08/26/2016 revealing normal LVEF, grade 2 diastolic dysfunction.  He also has moderate to severe pulmonary hypertension.  Patient had been noncompliant with diet and medications in the past.  However recently he has been seeing me on a frequent basis and has made significant lifestyle changes including calorie reduction and watching his salt intake.  He developed hypoglycemia due to oral hypoglycemic agents and was admitted to the hospital for further evaluation.  He has suffered acute kidney injury.  He has chronic stage IV kidney disease and is followed by Dr. Camille Balynthia Dunham, now was recommended hemodialysis.  Patient and his wife are very reluctant to starting hemodialysis.   He has been complaining of shortness of breath with exertion activity.  No PND or orthopnea, he has noticed leg edema.  No chest pain.  No palpitation, dizziness or syncope.  Denies any symptoms of claudication.  He has never smoked, does not drink alcohol.  No history of illicit drug use although he does admit to using marijuana occasionally.  No recent use.  Past Medical History:  Diagnosis Date  . CHF (congestive heart failure) (HCC)   . CKD (chronic kidney disease), stage IV (HCC)   . Hallux limitus    Bilateral  . History of gout ~ 2013/2014  . Hypertension    Negative duplex 2012 for RAS  . Hypertensive CKD (chronic kidney disease)   .  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 Migraine    "when I was young" (08/25/2016"  . Posterior equinus, acquired    Bilateral  . Pre-diabetes      History reviewed. No pertinent surgical history.   Family History  Problem Relation Age of Onset  . Hypertension Father        Also maternal grandmother  . Diabetes Father        also maternal grandmother  . Cancer Maternal Grandfather        lung  . Heart attack Maternal Grandfather      Social History: Social History   Social History  . Marital status: Married    Spouse name: N/A  . Number of children: N/A  . Years of education: N/A   Occupational History  . Not on file.   Social History Main Topics  . Smoking status: Former Games developermoker  . Smokeless tobacco: Never Used  . Alcohol use No  . Drug use: No  . Sexual activity: Not on file   Other Topics Concern  . Not on file   Social History Narrative  . No narrative on file     Prescriptions Prior to Admission  Medication Sig Dispense Refill Last Dose  . amLODipine (NORVASC) 10 MG tablet Take 1 tablet (10 mg total) by mouth at bedtime. 30 tablet 0 08/24/2016 at Unknown time  . calcitRIOL (ROCALTROL) 0.25 MCG capsule Take 0.5 mcg by mouth daily.    08/25/2016 at Unknown time  . carvedilol (COREG) 25 MG tablet  Take 1 tablet (25 mg total) by mouth 2 (two) times daily with a meal. 60 tablet 0 08/25/2016 at 0700  . cloNIDine (CATAPRES) 0.2 MG tablet Take 1 tablet (0.2 mg total) by mouth 3 (three) times daily. 90 tablet 0 08/25/2016 at Unknown time  . colchicine (COLCRYS) 0.6 MG tablet Take 0.6 mg by mouth daily.   08/25/2016 at Unknown time  . furosemide (LASIX) 40 MG tablet Take 80 mg by mouth 2 (two) times daily.   08/25/2016 at Unknown time  . metolazone (ZAROXOLYN) 2.5 MG tablet Take 2.5 mg by mouth 3 (three) times a week. Days of the week are not specified.   08/24/2016 at Unknown time  . minoxidil (LONITEN) 10 MG tablet Take 10 mg by mouth 2 (two) times daily.    08/25/2016  at Unknown time  . potassium chloride SA (K-DUR,KLOR-CON) 20 MEQ tablet Take 40 mEq by mouth 3 (three) times daily.   08/25/2016 at Unknown time    Review of Systems - Marked generalized weakness present.  Mild fatigue percent.  Chronic shortness of breath wasn't.  No hemoptysis.  Leg edema present.  No recent weight changes.  Diabetes mellitus present.  No headache, no visual disturbances.  No symptoms of claudication, chest pain or palpitation.    Physical Exam: Blood pressure (!) 181/101, pulse 83, temperature 98.4 F (36.9 C), temperature source Oral, resp. rate 18, height 5\' 10"  (1.778 m), weight 121.2 kg (267 lb 3.2 oz), SpO2 100 %. Body mass index is 38.34 kg/m.   General appearance: alert, cooperative, appears stated age, no distress and moderately obese Lungs: clear to auscultation bilaterally Heart: S1, S2 normal, S4 present and 2/6 midsystolic murmur at the apex heard. Abdomen: soft, non-tender; bowel sounds normal; no masses,  no organomegaly and Obese Extremities: edema 2 plus bilateral below-knee pitting edema.  Mild  facial edema present. Pulses: 2+ and symmetric Neurologic: Grossly normal  Labs:  Lab Results  Component Value Date   WBC 4.1 08/27/2016   HGB 7.8 (L) 08/27/2016   HCT 24.2 (L) 08/27/2016   MCV 87.7 08/27/2016   PLT 246 08/27/2016    Recent Labs Lab 08/25/16 1056  08/27/16 0357  NA 140  < > 137  K 4.2  < > 4.1  CL 106  < > 102  CO2 22  < > 23  BUN 51*  < > 56*  CREATININE 8.86*  < > 9.43*  CALCIUM 8.6*  < > 8.7*  PROT 6.1*  --   --   BILITOT 0.6  --   --   ALKPHOS 72  --   --   ALT 19  --   --   AST 20  --   --   GLUCOSE 40*  < > 189*  < > = values in this interval not displayed.  HEMOGLOBIN A1C Lab Results  Component Value Date   HGBA1C 5.5 08/25/2016   MPG 111 08/25/2016    Cardiac Panel (last 3 results)  Recent Labs  08/25/16 1215 08/25/16 2032 08/26/16 0315  CKTOTAL 249  --   --   TROPONINI  --  0.06* 0.06*    Lab  Results  Component Value Date   CKTOTAL 249 08/25/2016   CKMB 4.3 (H) 09/12/2011   TROPONINI 0.06 (HH) 08/26/2016     TSH  Recent Labs  08/25/16 1428  TSH 1.511   Radiology: Dg Chest 2 View  Result Date: 08/25/2016 CLINICAL DATA:  New onset of shortness of breath,  dizziness, and confusion since earlier today. History of hypertension but no other cardiopulmonary history. EXAM: CHEST  2 VIEW COMPARISON:  Chest x-ray of July 13, 2012 FINDINGS: The lungs are reasonably well inflated. There is no focal infiltrate. There is no pleural effusion. The cardiac silhouette is enlarged though this is not a new finding. The pulmonary vascularity is mildly prominent centrally. The bony thorax is unremarkable. IMPRESSION: Cardiomegaly with mild central pulmonary vascular prominence. No pulmonary edema or pneumonia. Electronically Signed   By: David  [place] M.D.   On: 08/25/2016 13:27    Scheduled Meds: . amLODipine  10 mg Oral QHS  . calcitRIOL  0.5 mcg Oral Daily  . carvedilol  25 mg Oral BID WC  . cloNIDine  0.3 mg Oral TID  . furosemide  160 mg Oral BID  . heparin  5,000 Units Subcutaneous Q8H  . sodium chloride flush  3 mL Intravenous Q12H   Continuous Infusions: . dextrose 10 mL/hr at 08/26/16 0035  . ferumoxytol Stopped (08/26/16 1311)   PRN Meds:.acetaminophen **OR** acetaminophen, senna-docusate  CARDIAC STUDIES:  EKG 08/25/2016: Normal sinus rhythm at the rate of 90 bpm, left atrial abnormality, LVH with repolarization abnormality, cannot exclude inferior and lateral ischemia.  No significant change compared to 08/25/2016.  Echocardiograms 08/26/2016: Normal LV systolic function, grade 2 diastolic dysfunction.  LVEF 60-65%.  Moderate LVH.  Mild to moderate MR and moderate TR, moderate to severe pulmonary hypertension, PA pressures 65 mmHg.  Trace precordial effusion.  ASSESSMENT AND PLAN:  1.  Acute on chronic diastolic heart failure  2.  Acute on chronic renal Failure, now stage V   Previously stage IV.  3.  Hypertension with hypertensive heart and renal disease 4. Moderate obesity. 5.  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 6.  Anemia of chronic disease. Recommendation:  Patient and his fife are reluctant to starting dialysis stating that he wants to give his lifestyle changes a chance to work and if serum creatinine and renal function does not improve over the next 2 weeks to 3 weeks, he is willing to go on for dialysis.  BP now well controlled. I have been Seeing him in the office on a frequent basis to improve compliance, he has shown remarkable compliance, in fact he has started to lose weight and has been cutting down on his food intake aggressively. I'll continue to follow him In the outpatient basis very closely, and if nephrology is willing to wait upon dialysis, may be to give him some time to think.  I also discussed with him regarding placement of AV shunt, patient states that there is too many things going on and he and his wife request that if we can wait for just a few weeks to see how he does.  Clinically there is mild acute decompensated heart failure, his echo findings clearly indicated to of volume overload.  We can continue to watch for the next 24 hours regarding his urinary output.  No changes were done by me on his medications. I have encouraged him to have AV shunt placed and suspect HD is probably inevitable.   Yates Decamp, MD 08/27/2016, 11:19 AM [facility] Pager: [phone] Office: [phone] If no answer Cell [phone]

## 2016-08-27 NOTE — Consult Note (Addendum)
Hospital Consult    Reason for Consult:  In need of permanent HD access  Referring Physician:  Briant Cedar  MRN #:  [id number]  History of Present Illness: Barry Nichols is a 41 y.o. ([birth year]) male who presented to the ED a couple of days ago with decreased level of consciousness and hypoglycemia.  He became weak, nauseous, and diaphoretic & very confused.  He said that he took a blood sugar pill that dropped his sugar to 29.  In the ER, his creatinine was 8.86 (was 4.1 in February).    He states he has had some blood in his urine recently and says he thinks it is being worked up.  He states he has had some leg swelling as well.   He is on a BB and CCB and Catapres for blood pressure management.  He has a hx of gout.  Past Medical History:  Diagnosis Date  . CHF (congestive heart failure) (HCC)   . CKD (chronic kidney disease), stage IV (HCC)   . Hallux limitus    Bilateral  . History of gout ~ 2013/2014  . Hypertension    Negative duplex 2012 for RAS  . Hypertensive CKD (chronic kidney disease)   . 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 Migraine    "when I was young" (08/25/2016"  . Posterior equinus, acquired    Bilateral  . Pre-diabetes     Surgical History: None  No Known Allergies  Prior to Admission medications   Medication Sig Start Date End Date Taking? Authorizing Provider  amLODipine (NORVASC) 10 MG tablet Take 1 tablet (10 mg total) by mouth at bedtime. 09/14/11 08/25/16 Yes Lonia Blood, MD  calcitRIOL (ROCALTROL) 0.25 MCG capsule Take 0.5 mcg by mouth daily.    Yes [provider]  carvedilol (COREG) 25 MG tablet Take 1 tablet (25 mg total) by mouth 2 (two) times daily with a meal. 09/14/11 08/25/16 Yes Lonia Blood, MD  cloNIDine (CATAPRES) 0.2 MG tablet Take 1 tablet (0.2 mg total) by mouth 3 (three) times daily. 09/14/11 08/25/16 Yes Lonia Blood, MD  colchicine (COLCRYS) 0.6 MG tablet Take 0.6 mg by mouth daily.    Yes [provider]  furosemide (LASIX) 40 MG tablet Take 80 mg by mouth 2 (two) times daily. 09/14/11 08/25/16 Yes Lonia Blood, MD  metolazone (ZAROXOLYN) 2.5 MG tablet Take 2.5 mg by mouth 3 (three) times a week. Days of the week are not specified.   Yes [provider]  minoxidil (LONITEN) 10 MG tablet Take 10 mg by mouth 2 (two) times daily.    Yes [provider]  potassium chloride SA (K-DUR,KLOR-CON) 20 MEQ tablet Take 40 mEq by mouth 3 (three) times daily.   Yes [provider]    Social History   Social History  . Marital status: Married    Spouse name: N/A  . Number of children: N/A  . Years of education: N/A   Occupational History  . Not on file.   Social History Main Topics  . Smoking status: Former Games developer  . Smokeless tobacco: Never Used  . Alcohol use No  . Drug use: No  . Sexual activity: Not on file   Other Topics Concern  . Not on file   Social History Narrative  . No narrative on file     Family History  Problem Relation Age of Onset  . Hypertension Father  Also maternal grandmother  . Diabetes Father        also maternal grandmother  . Cancer Maternal Grandfather        lung  . Heart attack Maternal Grandfather     ROS: [x]  Positive   [ ]  Negative   [ ]  All sytems reviewed and are negative  Cardiovascular: []  chest pain/pressure []  palpitations []  SOB lying flat []  DOE []  pain in legs while walking []  pain in legs at rest []  pain in legs at night []  non-healing ulcers []  hx of DVT []  swelling in legs  Pulmonary: []  productive cough []  asthma/wheezing []  home O2  Neurologic: []  weakness in []  arms []  legs []  numbness in []  arms []  legs []  hx of CVA []  mini stroke [] difficulty speaking or slurred speech []  temporary loss of vision in one eye []  dizziness  Hematologic: []  hx of cancer []  bleeding problems []  problems with blood clotting easily  Endocrine:   []  diabetes []  thyroid  disease  GI []  vomiting blood []  blood in stool  GU: [x]  CKD/renal failure []  HD--[]  M/W/F or []  T/T/S []  burning with urination [x]  blood in urine  Psychiatric: []  anxiety []  depression  Musculoskeletal: []  arthritis []  joint pain [x]  hx gout  Integumentary: []  rashes []  ulcers  Constitutional: []  fever []  chills   Physical Examination  Vitals:   08/27/16 0900 08/27/16 1122  BP: (!) 181/101 (!) 164/93  Pulse: 83 86  Resp:  18  Temp:  98.5 F (36.9 C)   Body mass index is 38.34 kg/m.  General:  WDWN in NAD Gait: Not observed HENT: WNL, normocephalic Pulmonary: normal non-labored breathing, without Rales, rhonchi,  wheezing Cardiac: regular, without  Murmurs, rubs or gallops; without carotid bruits Abdomen:  soft, NT/ND, no masses Skin: without rashes Vascular Exam/Pulses:  Right Left  Radial 2+ (normal) 2+ (normal)  Ulnar Unable to palpate  Unable to palpate   DP 2+ (normal) 2+ (normal)  PT Unable to palpate  Unable to palpate    Extremities: without ischemic changes, without Gangrene , without cellulitis; without open wounds; +pitting edema BLE Musculoskeletal: no muscle wasting or atrophy  Neurologic: A&O X 3;  No focal weakness or paresthesias are detected; speech is fluent/normal Psychiatric:  The pt has Normal affect. Lymph : No Cervical, Axillary, or Inguinal lymphadenopathy     CBC    Component Value Date/Time   WBC 4.1 08/27/2016 0357   RBC 2.76 (L) 08/27/2016 0357   HGB 7.8 (L) 08/27/2016 0357   HCT 24.2 (L) 08/27/2016 0357   PLT 246 08/27/2016 0357   MCV 87.7 08/27/2016 0357   MCH 28.3 08/27/2016 0357   MCHC 32.2 08/27/2016 0357   RDW 14.6 08/27/2016 0357   LYMPHSABS 0.7 08/25/2016 1056   MONOABS 0.3 08/25/2016 1056   EOSABS 0.1 08/25/2016 1056   BASOSABS 0.0 08/25/2016 1056    BMET    Component Value Date/Time   NA 137 08/27/2016 0357   K 4.1 08/27/2016 0357   CL 102 08/27/2016 0357   CO2 23 08/27/2016 0357    GLUCOSE 189 (H) 08/27/2016 0357   BUN 56 (H) 08/27/2016 0357   CREATININE 9.43 (H) 08/27/2016 0357   CALCIUM 8.7 (L) 08/27/2016 0357   GFRNONAA 6 (L) 08/27/2016 0357   GFRAA 7 (L) 08/27/2016 0357    COAGS: Lab Results  Component Value Date   INR 0.93 07/13/2012     Non-Invasive Vascular Imaging:   Vein mapping pending  Statin:  No. Beta Blocker:  Yes.   Aspirin:  No. ACEI:  No. ARB:  No. CCB use:  Yes Other antiplatelets/anticoagulants:  Yes.   SQ heparin    Radiology   No results found.  Laboratory: CBC:    Component Value Date/Time   WBC 4.1 08/27/2016 0357   RBC 2.76 (L) 08/27/2016 0357   HGB 7.8 (L) 08/27/2016 0357   HCT 24.2 (L) 08/27/2016 0357   PLT 246 08/27/2016 0357   MCV 87.7 08/27/2016 0357   MCH 28.3 08/27/2016 0357   MCHC 32.2 08/27/2016 0357   RDW 14.6 08/27/2016 0357   LYMPHSABS 0.7 08/25/2016 1056   MONOABS 0.3 08/25/2016 1056   EOSABS 0.1 08/25/2016 1056   BASOSABS 0.0 08/25/2016 1056    BMP:    Component Value Date/Time   NA 137 08/27/2016 0357   K 4.1 08/27/2016 0357   CL 102 08/27/2016 0357   CO2 23 08/27/2016 0357   GLUCOSE 189 (H) 08/27/2016 0357   BUN 56 (H) 08/27/2016 0357   CREATININE 9.43 (H) 08/27/2016 0357   CALCIUM 8.7 (L) 08/27/2016 0357   GFRNONAA 6 (L) 08/27/2016 0357   GFRAA 7 (L) 08/27/2016 0357    Coagulation: Lab Results  Component Value Date   INR 0.93 07/13/2012   No results found for: PTT  Lipids: No results found for: CHOL, TRIG, HDL, CHOLHDL, VLDL, LDLCALC, LDLDIRECT    ASSESSMENT/PLAN: This is a 41 y.o. male with CKD 5 in need of permanent HD access.  He is right hand dominant   -vein mapping has been ordered.  Will have more definitve plan once that is completed.  Hopefully will be able to have left arm access since he is right hand dominant.   -Dr. Imogene Burnhen will be by to see pt this afternoon.   Doreatha MassedSamantha Rhyne, PA-C [facility] [phone]  Addendum  I have  independently interviewed and examined the patient, and I agree with the physician assistant's findings.  Pt's reluctance to proceed with access is well documented.  Pt likely needs TDC + access.  Pt's left arm is more swollen than right, raising concerns that some SVT vs DVT in that arm.    - Vein mapping pending - Pt is pushing hard to leave the hospital.  At this point the patient is NOT interested in any access options. - No operative space to get on schedule tomorrow, so earliest would be Monday - Let us know if patient changes his mind.   Leonides SakeBrian Damyen Knoll, MD, FACS [facility] [place] Office: [phone] Pager: [phone]  08/27/2016, 1:25 PM

## 2016-08-27 NOTE — Progress Notes (Signed)
Notified the internal medicine team of BP 181/101. See MAR   Will continue to monitor   Barry ReevesNatalie N Naoko Diperna, RN

## 2016-08-30 LAB — PROINSULIN/INSULIN RATIO
Insulin: 73 u[IU]/mL — ABNORMAL HIGH
PROINSULIN/INSULIN RATIO: 34 %
Proinsulin: 164 pmol/L

## 2016-09-02 LAB — SULFONYLUREA HYPOGLYCEMICS PANEL, SERUM
ACETOHEXAMIDE: NEGATIVE ug/mL (ref 20–60)
Chlorpropamide: NEGATIVE ug/mL (ref 75–250)
GLIMEPIRIDE: NEGATIVE ng/mL (ref 80–250)
GLIPIZIDE: NEGATIVE ng/mL (ref 200–1000)
Glyburide: 87 ng/mL
NATEGLINIDE: NEGATIVE ng/mL
REPAGLINIDE: NEGATIVE ng/mL
TOLAZAMIDE: NEGATIVE ug/mL
Tolbutamide: NEGATIVE ug/mL (ref 40–100)

## 2016-09-16 ENCOUNTER — Ambulatory Visit (INDEPENDENT_AMBULATORY_CARE_PROVIDER_SITE_OTHER): Payer: BLUE CROSS/BLUE SHIELD | Admitting: Neurology

## 2016-09-16 ENCOUNTER — Encounter: Payer: Self-pay | Admitting: Neurology

## 2016-09-16 VITALS — BP 210/112 | HR 82 | Resp 18 | Ht 70.0 in | Wt 245.0 lb

## 2016-09-16 DIAGNOSIS — I110 Hypertensive heart disease with heart failure: Secondary | ICD-10-CM

## 2016-09-16 DIAGNOSIS — I10 Essential (primary) hypertension: Secondary | ICD-10-CM | POA: Diagnosis not present

## 2016-09-16 DIAGNOSIS — R351 Nocturia: Secondary | ICD-10-CM

## 2016-09-16 DIAGNOSIS — I5032 Chronic diastolic (congestive) heart failure: Secondary | ICD-10-CM

## 2016-09-16 DIAGNOSIS — G4733 Obstructive sleep apnea (adult) (pediatric): Secondary | ICD-10-CM

## 2016-09-16 NOTE — Patient Instructions (Addendum)
Based on your symptoms and your exam I believe you are at risk for obstructive sleep apnea or OSA, and I think we should proceed with a sleep study to determine whether you do or do not have OSA and how severe it is. If you have more than mild OSA, I want you to consider treatment with CPAP. Please remember, the risks and ramifications of moderate to severe obstructive sleep apnea or OSA are: Cardiovascular disease, including congestive heart failure, stroke, difficult to control hypertension, arrhythmias, and even type 2 diabetes has been linked to untreated OSA. Sleep apnea causes disruption of sleep and sleep deprivation in most cases, which, in turn, can cause recurrent headaches, problems with memory, mood, concentration, focus, and vigilance. Most people with untreated sleep apnea report excessive daytime sleepiness, which can affect their ability to drive. Please do not drive if you feel sleepy.   I will likely see you back after your sleep study to go over the test results and where to go from there. We will call you after your sleep study to advise about the results (most likely, you will hear from Lafonda Mossesiana, my nurse) and to set up an appointment at the time, as necessary.    Our sleep lab administrative assistant, Alvis LemmingsDawn will meet with you or call you to schedule your sleep study. If you don't hear back from her by next week please feel free to call her at [phone]. This is her direct line and please leave a message with your phone number to call back if you get the voicemail box. She will call back as soon as possible.   Your blood pressure was rather high today. Thankfully, you did not have any symptoms, should you have any sudden onset of one-sided weakness, numbness, tingling, shortness of breath, chest pain, one-sided headache or severe headache, changes in mental state, please proceed to the ER or call 911 immediately.  Please also get a formal eye exam done as your blood pressure tends to be  rather high and you would benefit from a dilated eye exam.

## 2016-09-16 NOTE — Progress Notes (Signed)
Subjective:    Patient ID: VINCENZO STAVE is a 41 y.o. male.  HPI     Huston Foley, MD, PhD [facility] [address], [address] [address] [place], [place] [address]  Dear Vonna Kotyk,   I saw your patient, Samael Blades, upon your kind request in my neurologic clinic today for initial consultation of his sleep disorder, in particular, concern for underlying obstructive sleep apnea. The patient is unaccompanied today. As you know, Mr. Dibari is a 41 year old right-handed gentleman with an underlying medical history of malignant hypertension, hypertensive heart disease, gout, diastolic heart failure, hyperlipidemia, and obesity, who reports snoring and excessive daytime somnolence. His Epworth sleepiness score is 16 out of 24, fatigue score is 11 out of 63.  I reviewed your office note from 08/14/2016, which you kindly included. In the past, echocardiogram in 2013 had shown severe LVH with ejection fraction of 35-40%. His echocardiogram from 08/14/2016 showed preserved left ventricular EF, grade 2 diastolic dysfunction. He has not had a sleep study. He was previously advised to have a sleep study as I understand. He snores and wife has mentioned apneas, he wakes up from apneas. He has nocturia, 2-3 times per night. 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 He denies a family history of OSA. He has a high blood pressure today, reports having taken his medications today. He denies any symptoms of shortness of breath, chest pain or headache or blurry vision. Of note, he does not have any prescription eye glasses and reports good vision but has not had a formal eye exam in over one year. Bedtime is reported to be around 11 PM, wakeup time around 6 AM. He denies morning headaches, he has nocturia. He denies telltale restless leg symptoms.   His Past Medical History Is Significant For: Past Medical History:   Diagnosis Date  . CHF (congestive heart failure) (HCC)   . CKD (chronic kidney disease), stage IV (HCC)   . Hallux limitus    Bilateral  . History of gout ~ 2013/2014  . Hypertension    Negative duplex 2012 for RAS  . Hypertensive CKD (chronic kidney disease)   . 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 Migraine    "when I was young" (08/25/2016"  . Posterior equinus, acquired    Bilateral  . Pre-diabetes     His Past Surgical History Is Significant For: No past surgical history on file.  His Family History Is Significant For: Family History  Problem Relation Age of Onset  . Hypertension Father        Also maternal grandmother  . Diabetes Father        also maternal grandmother  . Cancer Maternal Grandfather        lung  . Heart attack Maternal Grandfather     His Social History Is Significant For: Social History   Social History  . Marital status: Married    Spouse name: N/A  . Number of children: 1  . Years of education: BA   Social History Main Topics  . Smoking status: Former Games developer  . Smokeless tobacco: Never Used  . Alcohol use No  . Drug use: No  . Sexual activity: Not Asked   Other Topics Concern  . None   Social History Narrative   Drinks tea 3-5 times a week     His Allergies Are:  No Known Allergies:  His Current Medications Are:  Outpatient Encounter Prescriptions as of 09/16/2016  Medication Sig  . calcitRIOL (ROCALTROL) 0.25 MCG capsule Take 0.5 mcg by mouth daily.   . cloNIDine (CATAPRES) 0.3 MG tablet Take 1 tablet (0.3 mg total) by mouth 3 (three) times daily.  . furosemide (LASIX) 80 MG tablet Take 2 tablets (160 mg total) by mouth 2 (two) times daily.  Marland Kitchen amLODipine (NORVASC) 10 MG tablet Take 1 tablet (10 mg total) by mouth at bedtime.  . carvedilol (COREG) 25 MG tablet Take 1 tablet (25 mg total) by mouth 2 (two) times daily with a meal.   No facility-administered encounter medications on file as of 09/16/2016.    :  Review of Systems:  Out of a complete 14 point review of systems, all are reviewed and negative with the exception of these symptoms as listed below: Review of Systems  Neurological:       Patient has some trouble staying asleep, snores, witnessed apnea, wakes up feeling tired, daytime fatigue, occasionally takes a nap.   Epworth Sleepiness Scale 0= would never doze 1= slight chance of dozing 2= moderate chance of dozing 3= high chance of dozing  Sitting and reading:2 Watching TV:2 Sitting inactive in a public place (ex. Theater or meeting):3 As a passenger in a car for an hour without a break:1 Lying down to rest in the afternoon:3 Sitting and talking to someone:2 Sitting quietly after lunch (no alcohol):3 In a car, while stopped in traffic:0 Total:16   Objective:  Neurologic Exam  Physical Exam Physical Examination:   Vitals:   09/16/16 1548  BP: (!) 210/112  Pulse: 82  Resp: 18   General Examination: The patient is a very pleasant 41 y.o. male in no acute distress. He appears well-developed and well-nourished and well groomed. Denies blurry vision, shortness referral, chest pain, headache.   HEENT: Normocephalic, atraumatic, pupils are equal, round and reactive to light and accommodation. Funduscopic exam is normal with sharp disc margins noted. Extraocular tracking is good without limitation to gaze excursion or nystagmus noted. Normal smooth pursuit is noted. Hearing is grossly intact. Face is symmetric with normal facial animation and normal facial sensation. Speech is clear with no dysarthria noted. There is no hypophonia. There is no lip, neck/head, jaw or voice tremor. Neck is supple with full range of passive and active motion. There are no carotid bruits on auscultation. Oropharynx exam reveals: mild mouth dryness, adequate dental hygiene and marked airway crowding, due to larger uvula, tonsils in place, thicker soft palate, tonsils size of about 2+ bilaterally.  Mallampati is class III. Neck circumference is 18 inches. He has a mild overbite. Tongue protrudes centrally and palate elevates symmetrically. Tongue is on the larger side as well.  Chest: Clear to auscultation without wheezing, rhonchi or crackles noted.  Heart: S1+S2+0, regular and normal without murmurs, rubs or gallops noted.   Abdomen: Soft, non-tender and non-distended with normal bowel sounds appreciated on auscultation.  Extremities: There is 1+ pitting edema in the distal lower extremities bilaterally. Pedal pulses are intact.  Skin: Warm and dry without trophic changes noted.  Musculoskeletal: exam reveals no obvious joint deformities, tenderness or joint swelling or erythema.   Neurologically:  Mental status: The patient is awake, alert and oriented in all 4 spheres. His immediate and remote memory, attention, language skills and fund of knowledge are appropriate. There is no evidence of aphasia, agnosia, apraxia or anomia. Speech is clear with normal prosody and enunciation. Thought process is linear.  Mood is normal and affect is normal.  Cranial nerves II - XII are as described above under HEENT exam. In addition: shoulder shrug is normal with equal shoulder height noted. Motor exam: Normal bulk, strength and tone is noted. There is no drift, tremor or rebound. Romberg is negative. Reflexes are 2+ throughout. Fine motor skills and coordination: intact with normal finger taps, normal hand movements, normal rapid alternating patting, normal foot taps and normal foot agility.  Cerebellar testing: No dysmetria or intention tremor on finger to nose testing. Heel to shin is unremarkable bilaterally. There is no truncal or gait ataxia.  Sensory exam: intact to light touch, vibration, temperature sense in the upper and lower extremities.  Gait, station and balance: He stands easily. No veering to one side is noted. No leaning to one side is noted. Posture is age-appropriate and stance is  narrow based. Gait shows normal stride length and normal pace. No problems turning are noted. Tandem walk is unremarkable.            Assessment and Plan:  In summary, Margy Clarkslvin A Ines is a very pleasant 41 y.o.-year old male with an underlying medical history of malignant hypertension, hypertensive heart disease, gout, diastolic heart failure, hyperlipidemia, and obesity, whose history and physical exam are in keeping with obstructive sleep apnea (OSA). I had a long chat with the patient about my findings and the diagnosis of OSA, its prognosis and treatment options. We talked about medical treatments, surgical interventions and non-pharmacological approaches. I explained in particular the risks and ramifications of untreated moderate to severe OSA, especially with respect to developing cardiovascular disease down the Road, including congestive heart failure, difficult to treat hypertension, cardiac arrhythmias, or stroke. Even type 2 diabetes has, in part, been linked to untreated OSA. Symptoms of untreated OSA include daytime sleepiness, memory problems, mood irritability and mood disorder such as depression and anxiety, lack of energy, as well as recurrent headaches, especially morning headaches. We talked about trying to maintain a healthy lifestyle in general, as well as the importance of weight control. I encouraged the patient to eat healthy, exercise daily and keep well hydrated, to keep a scheduled bedtime and wake time routine, to not skip any meals and eat healthy snacks in between meals. I advised the patient not to drive when feeling sleepy. I recommended the following at this time: sleep study with potential positive airway pressure titration. (We will score hypopneas at 3%).   I explained the sleep test procedure to the patient and also outlined possible surgical and non-surgical treatment options of OSA, including the use of a custom-made dental device (which would require a referral to a  specialist dentist or oral surgeon), upper airway surgical options, such as pillar implants, radiofrequency surgery, tongue base surgery, and UPPP (which would involve a referral to an ENT surgeon). Rarely, jaw surgery such as mandibular advancement may be considered.  I also explained the CPAP treatment option to the patient, who indicated that he would be willing to try CPAP if the need arises. I explained the importance of being compliant with PAP treatment, not only for insurance purposes but primarily to improve His symptoms, and for the patient's long term health benefit, including to reduce His cardiovascular risks. I answered all his questions today and the patient was in agreement. I would like to see him back after the sleep study is completed and encouraged him to call with any interim questions, concerns, problems or updates.   Thank you very  much for allowing me to participate in the care of this nice patient. If I can be of any further assistance to you please do not hesitate to call me at [phone].  Sincerely,   Star Age, MD, PhD

## 2016-09-30 ENCOUNTER — Ambulatory Visit (INDEPENDENT_AMBULATORY_CARE_PROVIDER_SITE_OTHER): Payer: BLUE CROSS/BLUE SHIELD | Admitting: Neurology

## 2016-09-30 ENCOUNTER — Encounter: Payer: Self-pay | Admitting: Vascular Surgery

## 2016-09-30 DIAGNOSIS — G4733 Obstructive sleep apnea (adult) (pediatric): Secondary | ICD-10-CM

## 2016-09-30 DIAGNOSIS — R0683 Snoring: Secondary | ICD-10-CM

## 2016-09-30 DIAGNOSIS — G4720 Circadian rhythm sleep disorder, unspecified type: Secondary | ICD-10-CM

## 2016-10-05 NOTE — Procedures (Signed)
PATIENT'S NAME:  Barry Nichols, Barry Nichols DOB:      1976/02/23      MR#:    [id number]     DATE OF RECORDING: 09/30/2016 REFERRING M.D.:  Yates Decamp, MD Study Performed:   Baseline Polysomnogram HISTORY: 41 year old man with a history of malignant hypertension, hypertensive heart disease, gout, diastolic heart failure, hyperlipidemia, and obesity, who reports snoring and excessive daytime somnolence. His Epworth sleepiness score is 16 out of 24, fatigue score is 11 out of 63. The patient's weight 245 pounds with a height of 70 (inches), resulting in a BMI of 35. kg/m2. The patient's neck circumference measured 18 inches.  CURRENT MEDICATIONS: Rocatrol; Catapres; Lasix; Norvasc; Coreg   PROCEDURE:  This is a multichannel digital polysomnogram utilizing the Somnostar 11.2 system.  Electrodes and sensors were applied and monitored per AASM Specifications.   EEG, EOG, Chin and Limb EMG, were sampled at 200 Hz.  ECG, Snore and Nasal Pressure, Thermal Airflow, Respiratory Effort, CPAP Flow and Pressure, Oximetry was sampled at 50 Hz. Digital video and audio were recorded.      BASELINE STUDY  Lights Out was at 22:42 and Lights On at 04:41, as patient requested to end study as soon as possible.  Total recording time (TRT) was 359.5 minutes, with a total sleep time (TST) of  261 minutes.   The patient's sleep latency was 0.5 minutes, REM was absent. The sleep efficiency was 72.6 %.     SLEEP ARCHITECTURE: WASO (Wake after sleep onset) was 52.5 minutes with 3 longer periods of wakefulness. There were 4.5 minutes in Stage N1, 127.5 minutes Stage N2, 129 minutes Stage N3 and 0 minutes in Stage REM.  The percentage of Stage N1 was 1.7%, Stage N2 was 48.9%, which is normal, Stage N3 was 49.4%, which is increased, and Stage R (REM sleep) was absent.  The arousals were noted as: 30 were spontaneous, 5 were associated with PLMs, 10 were associated with respiratory events.    Audio and video analysis did not show any  abnormal or unusual movements, behaviors, phonations or vocalizations.   The patient took 1 bathroom break. Moderate to loud snoring was noted. The EKG was in keeping with normal sinus rhythm (NSR).  RESPIRATORY ANALYSIS:  There were a total of 10 respiratory events:  4 obstructive apneas, 1 central apneas and 5 mixed apneas with a total of 10 apneas and an apnea index (AI) of 2.3 /hour. There were 0 hypopneas with a hypopnea index of 0 /hour. The patient also had 0 respiratory event related arousals (RERAs).      The total APNEA/HYPOPNEA INDEX (AHI) was 2.3/hour and the total RESPIRATORY DISTURBANCE INDEX was 2.3 /hour.  0 events occurred in REM sleep and 6 events in NREM. The REM AHI was n/a, versus a non-REM AHI of 2.3. The patient spent 122.5 minutes of total sleep time in the supine position and 139 minutes in non-supine.. The supine AHI was 1.5 versus a non-supine AHI of 3.0.  OXYGEN SATURATION & C02:  The Wake baseline 02 saturation was 98%, with the lowest being 90%. Time spent below 89% saturation equaled 0 minutes.  PERIODIC LIMB MOVEMENTS:  The patient had a total of 19 Periodic Limb Movements.  The Periodic Limb Movement (PLM) index was 4.4 and the PLM Arousal index was 1.1/hour.  Post-study, the patient indicated that sleep was worse than usual.   IMPRESSION:  1. Primary Snoring 2. Dysfunctions associated with sleep stages or arousal from sleep  RECOMMENDATIONS:  1. This study does not demonstrate any significant obstructive or central sleep disordered breathing with the exception of snoring, which was moderate to loud. Of note, the absence of REM sleep during this study may underestimate his AHI and O2 nadir. For disturbing snoring, an oral appliance (through a qualified dentist) can be considered. 2. This study shows sleep fragmentation and abnormal sleep stage percentages; these are nonspecific findings and per se do not signify an intrinsic sleep disorder or a cause for the  patient's sleep-related symptoms. Causes include (but are not limited to) the first night effect of the sleep study, circadian rhythm disturbances, medication effect or an underlying mood disorder or medical problem.  3. The patient should be cautioned not to drive, work at heights, or operate dangerous or heavy equipment when tired or sleepy. Review and reiteration of good sleep hygiene measures should be pursued with any patient. 4. The can follow up with the referring provider who will be notified of the test results.  I certify that I have reviewed the entire raw data recording prior to the issuance of this report in accordance with the Standards of Accreditation of the American Academy of Sleep Medicine (AASM)    Huston FoleySaima Joeziah Voit, MD, PhD Diplomat, American Board of Psychiatry and Neurology (Neurology and Sleep Medicine)

## 2016-10-05 NOTE — Progress Notes (Signed)
Patient referred by Dr. Jacinto HalimGanji, seen by me on 09/16/16, diagnostic PSG on 09/30/16.   Please call and notify the patient that the recent sleep study did not show any significant obstructive sleep apnea with the exception of snoring, which was moderate to loud. Of note, the absence of REM sleep during this study may underestimate his AHI and O2 nadir. For disturbing snoring, an oral appliance (through a qualified dentist) can be considered. Please inform patient that he can FU w Dr. Jacinto HalimGanji and his PCP.   Once you have spoken to patient, you can close this encounter.   Thanks,  Huston FoleySaima Zivah Mayr, MD, PhD [facility])

## 2016-10-07 ENCOUNTER — Encounter: Payer: BLUE CROSS/BLUE SHIELD | Admitting: Vascular Surgery

## 2016-10-07 ENCOUNTER — Telehealth: Payer: Self-pay

## 2016-10-07 NOTE — Telephone Encounter (Signed)
I called pt. I advised him that his sleep study did not show any significant obstructive sleep apnea with the exception of snoring, which was moderate to loud. Pt did not reach REM sleep during his study, which may underestimate his AHI and O2 nadir. Pt reports that the pt with him in the sleep lab in the other room became belligerent and woke the pt up because of the noise. I advised him that if his snoring is disturbing to him or others, he can consider treatment through a qualified dentist for an oral appliance. Pt asked that I send a copy of this sleep study to Dr. Jacinto HalimGanji and Dr. Mardene SayerMorgan Wriht (PCP). Pt declined a follow up with Dr. Frances FurbishAthar at this time. Pt verbalized understanding of results. Pt had no questions at this time but was encouraged to call back if questions arise.

## 2016-10-07 NOTE — Telephone Encounter (Signed)
-----   Message from Huston FoleySaima Athar, MD sent at 10/05/2016  5:52 PM EDT ----- Patient referred by Dr. Jacinto HalimGanji, seen by me on 09/16/16, diagnostic PSG on 09/30/16.   Please call and notify the patient that the recent sleep study did not show any significant obstructive sleep apnea with the exception of snoring, which was moderate to loud. Of note, the absence of REM sleep during this study may underestimate his AHI and O2 nadir. For disturbing snoring, an oral appliance (through a qualified dentist) can be considered. Please inform patient that he can FU w Dr. Jacinto HalimGanji and his PCP.   Once you have spoken to patient, you can close this encounter.   Thanks,  Huston FoleySaima Athar, MD, PhD [facility])

## 2016-10-13 ENCOUNTER — Encounter (HOSPITAL_COMMUNITY): Payer: Self-pay | Admitting: *Deleted

## 2016-10-13 ENCOUNTER — Encounter: Payer: Self-pay | Admitting: Vascular Surgery

## 2016-10-13 ENCOUNTER — Ambulatory Visit (INDEPENDENT_AMBULATORY_CARE_PROVIDER_SITE_OTHER): Payer: BLUE CROSS/BLUE SHIELD | Admitting: Vascular Surgery

## 2016-10-13 ENCOUNTER — Other Ambulatory Visit: Payer: Self-pay

## 2016-10-13 VITALS — BP 178/99 | HR 90 | Temp 99.2°F | Resp 20 | Ht 70.0 in | Wt 264.0 lb

## 2016-10-13 DIAGNOSIS — Z992 Dependence on renal dialysis: Secondary | ICD-10-CM | POA: Diagnosis not present

## 2016-10-13 DIAGNOSIS — N186 End stage renal disease: Secondary | ICD-10-CM | POA: Diagnosis not present

## 2016-10-13 NOTE — Progress Notes (Signed)
[facility]ist of [place]  Patient name: Barry Nichols MRN: [id number] DOB: 1976-04-06 Sex: male  REASON FOR CONSULT: Evaluate for hemodialysis access.  HPI: Barry Nichols is a 41 y.o. male, who is now stage V kidney failure requiring hemodialysis. Poorly he had been somewhat resistant to this in the past. His creatinine is now 10 and he has agreed to hemodialysis. We are seeing him for placement of catheter and arm access for acute immediate hemodialysis. His failure as renal function is been felt to be related to uncontrolled hypertension  Past Medical History:  Diagnosis Date  . CHF (congestive heart failure) (HCC)   . CKD (chronic kidney disease), stage IV (HCC)   . Hallux limitus    Bilateral  . History of gout ~ 2013/2014  . Hypertension    Negative duplex 2012 for RAS  . Hypertensive CKD (chronic kidney disease)   . 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 Migraine    "when I was young" (08/25/2016"  . Posterior equinus, acquired    Bilateral  . Pre-diabetes     Family History  Problem Relation Age of Onset  . Hypertension Father        Also maternal grandmother  . Diabetes Father        also maternal grandmother  . Cancer Maternal Grandfather        lung  . Heart attack Maternal Grandfather     SOCIAL HISTORY: Social History   Social History  . Marital status: Married    Spouse name: N/A  . Number of children: 1  . Years of education: BA   Occupational History  . Not on file.   Social History Main Topics  . Smoking status: Former Games developer  . Smokeless tobacco: Never Used  . Alcohol use No  . Drug use: No  . Sexual activity: Not on file   Other Topics Concern  . Not on file   Social History Narrative   Drinks tea 3-5 times a week     No Known Allergies  Current Outpatient Prescriptions  Medication Sig Dispense Refill  . calcitRIOL (ROCALTROL) 0.25 MCG capsule Take 0.5  mcg by mouth daily.     . cloNIDine (CATAPRES) 0.3 MG tablet Take 1 tablet (0.3 mg total) by mouth 3 (three) times daily. 90 tablet 0  . furosemide (LASIX) 80 MG tablet Take 2 tablets (160 mg total) by mouth 2 (two) times daily. 120 tablet 0  . metolazone (ZAROXOLYN) 10 MG tablet Take 10 mg by mouth daily.    Marland Kitchen MINOXIDIL PO Take 0.1 mg by mouth 2 (two) times daily.    Marland Kitchen amLODipine (NORVASC) 10 MG tablet Take 1 tablet (10 mg total) by mouth at bedtime. 30 tablet 0  . carvedilol (COREG) 25 MG tablet Take 1 tablet (25 mg total) by mouth 2 (two) times daily with a meal. 60 tablet 0   No current facility-administered medications for this visit.     REVIEW OF SYSTEMS:  [X]  denotes positive finding, [ ]  denotes negative finding Cardiac  Comments:  Chest pain or chest pressure:    Shortness of breath upon exertion: x   Short of breath when lying flat:    Irregular heart rhythm:        Vascular    Pain in calf, thigh, or hip brought on by ambulation:    Pain in feet at night that wakes you up from your sleep:  Blood clot in your veins:    Leg swelling:  x       Pulmonary    Oxygen at home:    Productive cough:     Wheezing:         Neurologic    Sudden weakness in arms or legs:     Sudden numbness in arms or legs:     Sudden onset of difficulty speaking or slurred speech:    Temporary loss of vision in one eye:     Problems with dizziness:         Gastrointestinal    Blood in stool:     Vomited blood:         Genitourinary    Burning when urinating:     Blood in urine: x       Psychiatric    Major depression:         Hematologic    Bleeding problems:    Problems with blood clotting too easily:        Skin    Rashes or ulcers:        Constitutional    Fever or chills:      PHYSICAL EXAM: Vitals:   10/13/16 0919 10/13/16 0926  BP: (!) 181/103 (!) 178/99  Pulse: 90   Resp: 20   Temp: 99.2 F (37.3 C)   TempSrc: Oral   SpO2: 90%   Weight: 264 lb (119.7 kg)     Height: 5\' 10"  (1.778 m)     GENERAL: The patient is a well-nourished male, in no acute distress. The vital signs are documented above. CARDIOVASCULAR: 2+ radial pulses bilaterally. Moderate sized cephalic vein at the wrist. PULMONARY: There is good air exchange  ABDOMEN: Soft and non-tender  MUSCULOSKELETAL: There are no major deformities or cyanosis. NEUROLOGIC: No focal weakness or paresthesias are detected. SKIN: There are no ulcers or rashes noted. PSYCHIATRIC: The patient has a normal affect.  DATA:  Review[facility] May [facility].  Also imaged his veins with SonoSite ultrasound in our office.  MEDICAL ISSUES: Progression to end-stage renal disease now needing acute dialysis. Discussed placement of a tunneled catheter and the potential limitations of this. Also discussed AV fistula and AV graft placement. He is right-handed. His vein map showed small to moderate-sized cephalic veins bilaterally. On my imaging with SonoSite he does have very nice cephalic vein at the wrist all the way through his forearm bilaterally. I would recommend left radiocephalic fistula as his first access. Explained the potential non-maturation of this and also potential future difficulty with clotting and other issues. He understands we will schedule this this week for initiation of access. He understands [facility].   Larina Earthlyodd F. Talyssa Gibas, MD [facility] Office Tel [phone] Pager [phone]

## 2016-10-13 NOTE — Progress Notes (Signed)
Spoke with pt for pre-op call. Pt denies cardiac history, chest pain or sob. He sees Dr. Jacinto HalimGanji for HTN. Pt states he's pre-diabetic. Last A1C was 5.5 on 08/25/16.

## 2016-10-15 ENCOUNTER — Ambulatory Visit (HOSPITAL_COMMUNITY): Payer: BLUE CROSS/BLUE SHIELD

## 2016-10-15 ENCOUNTER — Ambulatory Visit (HOSPITAL_COMMUNITY): Payer: BLUE CROSS/BLUE SHIELD | Admitting: Certified Registered"

## 2016-10-15 ENCOUNTER — Ambulatory Visit (HOSPITAL_COMMUNITY)
Admission: RE | Admit: 2016-10-15 | Discharge: 2016-10-15 | Disposition: A | Discharge status: Home / Self Care | Payer: BLUE CROSS/BLUE SHIELD | Source: Ambulatory Visit | Attending: Vascular Surgery | Admitting: Vascular Surgery

## 2016-10-15 ENCOUNTER — Encounter (HOSPITAL_COMMUNITY)
Admission: RE | Disposition: A | Discharge status: Home / Self Care | Payer: Self-pay | Source: Ambulatory Visit | Attending: Vascular Surgery

## 2016-10-15 ENCOUNTER — Encounter (HOSPITAL_COMMUNITY): Payer: Self-pay | Admitting: Certified Registered"

## 2016-10-15 DIAGNOSIS — Z87891 Personal history of nicotine dependence: Secondary | ICD-10-CM | POA: Insufficient documentation

## 2016-10-15 DIAGNOSIS — Z992 Dependence on renal dialysis: Secondary | ICD-10-CM

## 2016-10-15 DIAGNOSIS — I509 Heart failure, unspecified: Secondary | ICD-10-CM | POA: Insufficient documentation

## 2016-10-15 DIAGNOSIS — M109 Gout, unspecified: Secondary | ICD-10-CM | POA: Diagnosis not present

## 2016-10-15 DIAGNOSIS — N186 End stage renal disease: Secondary | ICD-10-CM | POA: Insufficient documentation

## 2016-10-15 DIAGNOSIS — Z79899 Other long term (current) drug therapy: Secondary | ICD-10-CM | POA: Diagnosis not present

## 2016-10-15 DIAGNOSIS — Z419 Encounter for procedure for purposes other than remedying health state, unspecified: Secondary | ICD-10-CM

## 2016-10-15 DIAGNOSIS — Z95828 Presence of other vascular implants and grafts: Secondary | ICD-10-CM

## 2016-10-15 DIAGNOSIS — R7303 Prediabetes: Secondary | ICD-10-CM | POA: Diagnosis not present

## 2016-10-15 DIAGNOSIS — N185 Chronic kidney disease, stage 5: Secondary | ICD-10-CM | POA: Diagnosis not present

## 2016-10-15 DIAGNOSIS — I132 Hypertensive heart and chronic kidney disease with heart failure and with stage 5 chronic kidney disease, or end stage renal disease: Secondary | ICD-10-CM | POA: Insufficient documentation

## 2016-10-15 HISTORY — PX: INSERTION OF DIALYSIS CATHETER: SHX1324

## 2016-10-15 HISTORY — DX: Anemia, unspecified: D64.9

## 2016-10-15 HISTORY — PX: AV FISTULA PLACEMENT: SHX1204

## 2016-10-15 LAB — POCT I-STAT 4, (NA,K, GLUC, HGB,HCT)
Glucose, Bld: 128 mg/dL — ABNORMAL HIGH (ref 65–99)
HCT: 22 % — ABNORMAL LOW (ref 39.0–52.0)
Hemoglobin: 7.5 g/dL — ABNORMAL LOW (ref 13.0–17.0)
Potassium: 3.4 mmol/L — ABNORMAL LOW (ref 3.5–5.1)
SODIUM: 141 mmol/L (ref 135–145)

## 2016-10-15 SURGERY — INSERTION OF DIALYSIS CATHETER
Anesthesia: General | Site: Neck | Laterality: Right

## 2016-10-15 MED ORDER — HEPARIN SODIUM (PORCINE) 1000 UNIT/ML IJ SOLN
INTRAMUSCULAR | Status: DC | PRN
  Administered 2016-10-15: 3400 [IU] via INTRAVENOUS

## 2016-10-15 MED ORDER — OXYCODONE-ACETAMINOPHEN 5-325 MG PO TABS: 1.0000 | ORAL_TABLET | Freq: Four times a day (QID) | ORAL | 0 refills | Status: DC | PRN

## 2016-10-15 MED ORDER — PHENYLEPHRINE HCL 10 MG/ML IJ SOLN
INTRAMUSCULAR | Status: DC | PRN
  Administered 2016-10-15: 80 ug via INTRAVENOUS
  Administered 2016-10-15 (×3): 40 ug via INTRAVENOUS

## 2016-10-15 MED ORDER — ONDANSETRON HCL 4 MG/2ML IJ SOLN: 4.0000 mg | Freq: Once | INTRAMUSCULAR | Status: DC | PRN

## 2016-10-15 MED ORDER — SODIUM CHLORIDE 0.9 % IV SOLN
INTRAVENOUS | Status: DC
  Administered 2016-10-15 (×3): via INTRAVENOUS

## 2016-10-15 MED ORDER — HEPARIN SODIUM (PORCINE) 1000 UNIT/ML IJ SOLN
INTRAMUSCULAR | Status: AC
  Filled 2016-10-15: qty 1

## 2016-10-15 MED ORDER — PROPOFOL 10 MG/ML IV BOLUS
INTRAVENOUS | Status: DC | PRN
  Administered 2016-10-15: 200 mg via INTRAVENOUS

## 2016-10-15 MED ORDER — 0.9 % SODIUM CHLORIDE (POUR BTL) OPTIME
TOPICAL | Status: DC | PRN
Start: 2016-10-15 — End: 2016-10-15
  Administered 2016-10-15: 1000 mL

## 2016-10-15 MED ORDER — MIDAZOLAM HCL 2 MG/2ML IJ SOLN
INTRAMUSCULAR | Status: AC
  Filled 2016-10-15: qty 2

## 2016-10-15 MED ORDER — LIDOCAINE HCL (PF) 1 % IJ SOLN
INTRAMUSCULAR | Status: AC
  Filled 2016-10-15: qty 30

## 2016-10-15 MED ORDER — DEXTROSE 5 % IV SOLN
INTRAVENOUS | Status: AC
  Filled 2016-10-15: qty 1.5

## 2016-10-15 MED ORDER — CEFUROXIME SODIUM 1.5 G IV SOLR
1.5000 g | INTRAVENOUS | Status: AC
  Administered 2016-10-15: 1.5 g via INTRAVENOUS

## 2016-10-15 MED ORDER — LIDOCAINE-EPINEPHRINE (PF) 1 %-1:200000 IJ SOLN
INTRAMUSCULAR | Status: AC
  Filled 2016-10-15: qty 30

## 2016-10-15 MED ORDER — MIDAZOLAM HCL 5 MG/5ML IJ SOLN
INTRAMUSCULAR | Status: DC | PRN
  Administered 2016-10-15: 2 mg via INTRAVENOUS

## 2016-10-15 MED ORDER — PROPOFOL 10 MG/ML IV BOLUS
INTRAVENOUS | Status: AC
  Filled 2016-10-15: qty 20

## 2016-10-15 MED ORDER — IOPAMIDOL (ISOVUE-300) INJECTION 61%
INTRAVENOUS | Status: AC
  Filled 2016-10-15: qty 50

## 2016-10-15 MED ORDER — ONDANSETRON HCL 4 MG/2ML IJ SOLN
INTRAMUSCULAR | Status: DC | PRN
Start: 2016-10-15 — End: 2016-10-15
  Administered 2016-10-15: 4 mg via INTRAVENOUS

## 2016-10-15 MED ORDER — FENTANYL CITRATE (PF) 100 MCG/2ML IJ SOLN
INTRAMUSCULAR | Status: DC | PRN
  Administered 2016-10-15 (×7): 25 ug via INTRAVENOUS

## 2016-10-15 MED ORDER — SODIUM CHLORIDE 0.9 % IV SOLN: INTRAVENOUS | Status: DC

## 2016-10-15 MED ORDER — LIDOCAINE HCL (CARDIAC) 20 MG/ML IV SOLN
INTRAVENOUS | Status: DC | PRN
  Administered 2016-10-15: 100 mg via INTRAVENOUS

## 2016-10-15 MED ORDER — FENTANYL CITRATE (PF) 250 MCG/5ML IJ SOLN
INTRAMUSCULAR | Status: AC
  Filled 2016-10-15: qty 5

## 2016-10-15 MED ORDER — FENTANYL CITRATE (PF) 100 MCG/2ML IJ SOLN
25.0000 ug | INTRAMUSCULAR | Status: DC | PRN
Start: 2016-10-15 — End: 2016-10-15
  Administered 2016-10-15 (×3): 25 ug via INTRAVENOUS

## 2016-10-15 MED ORDER — SODIUM CHLORIDE 0.9 % IV SOLN
INTRAVENOUS | Status: DC | PRN
  Administered 2016-10-15: 10:00:00

## 2016-10-15 MED ORDER — FENTANYL CITRATE (PF) 100 MCG/2ML IJ SOLN
INTRAMUSCULAR | Status: AC
  Filled 2016-10-15: qty 2

## 2016-10-15 MED ORDER — CHLORHEXIDINE GLUCONATE CLOTH 2 % EX PADS: 6.0000 | MEDICATED_PAD | Freq: Once | CUTANEOUS | Status: DC

## 2016-10-15 SURGICAL SUPPLY — 60 items
ADH SKN CLS APL DERMABOND .7 (GAUZE/BANDAGES/DRESSINGS) ×4
AGENT HMST SPONGE THK3/8 (HEMOSTASIS)
ARMBAND PINK RESTRICT EXTREMIT (MISCELLANEOUS) ×6 IMPLANT
BAG DECANTER FOR FLEXI CONT (MISCELLANEOUS) ×4 IMPLANT
BIOPATCH RED 1 DISK 7.0 (GAUZE/BANDAGES/DRESSINGS) ×3 IMPLANT
BIOPATCH RED 1IN DISK 7.0MM (GAUZE/BANDAGES/DRESSINGS) ×1
CANISTER SUCT 3000ML PPV (MISCELLANEOUS) ×4 IMPLANT
CATH PALINDROME RT-P 15FX19CM (CATHETERS) IMPLANT
CATH PALINDROME RT-P 15FX23CM (CATHETERS) ×2 IMPLANT
CATH PALINDROME RT-P 15FX28CM (CATHETERS) IMPLANT
CATH PALINDROME RT-P 15FX55CM (CATHETERS) IMPLANT
CATH STRAIGHT 5FR 65CM (CATHETERS) IMPLANT
CLIP TI MEDIUM 6 (CLIP) ×4 IMPLANT
CLIP TI WIDE RED SMALL 6 (CLIP) ×4 IMPLANT
COVER PROBE W GEL 5X96 (DRAPES) ×6 IMPLANT
COVER SURGICAL LIGHT HANDLE (MISCELLANEOUS) ×4 IMPLANT
DECANTER SPIKE VIAL GLASS SM (MISCELLANEOUS) ×4 IMPLANT
DERMABOND ADVANCED (GAUZE/BANDAGES/DRESSINGS) ×4
DERMABOND ADVANCED .7 DNX12 (GAUZE/BANDAGES/DRESSINGS) ×2 IMPLANT
DRAPE C-ARM 42X72 X-RAY (DRAPES) ×4 IMPLANT
DRAPE CHEST BREAST 15X10 FENES (DRAPES) ×4 IMPLANT
ELECT REM PT RETURN 9FT ADLT (ELECTROSURGICAL) ×4
ELECTRODE REM PT RTRN 9FT ADLT (ELECTROSURGICAL) ×2 IMPLANT
GAUZE SPONGE 4X4 12PLY STRL (GAUZE/BANDAGES/DRESSINGS) ×2 IMPLANT
GAUZE SPONGE 4X4 16PLY XRAY LF (GAUZE/BANDAGES/DRESSINGS) ×4 IMPLANT
GLOVE BIO SURGEON STRL SZ7 (GLOVE) ×6 IMPLANT
GLOVE BIOGEL PI IND STRL 7.5 (GLOVE) ×2 IMPLANT
GLOVE BIOGEL PI INDICATOR 7.5 (GLOVE) ×4
GOWN STRL REUS W/ TWL LRG LVL3 (GOWN DISPOSABLE) ×6 IMPLANT
GOWN STRL REUS W/TWL LRG LVL3 (GOWN DISPOSABLE) ×12
HEMOSTAT SPONGE AVITENE ULTRA (HEMOSTASIS) IMPLANT
KIT BASIN OR (CUSTOM PROCEDURE TRAY) ×4 IMPLANT
KIT ROOM TURNOVER OR (KITS) ×4 IMPLANT
NDL 18GX1X1/2 (RX/OR ONLY) (NEEDLE) ×2 IMPLANT
NDL HYPO 25GX1X1/2 BEV (NEEDLE) ×2 IMPLANT
NEEDLE 18GX1X1/2 (RX/OR ONLY) (NEEDLE) ×4 IMPLANT
NEEDLE HYPO 25GX1X1/2 BEV (NEEDLE) ×4 IMPLANT
NS IRRIG 1000ML POUR BTL (IV SOLUTION) ×4 IMPLANT
PACK CV ACCESS (CUSTOM PROCEDURE TRAY) ×4 IMPLANT
PACK SURGICAL SETUP 50X90 (CUSTOM PROCEDURE TRAY) ×4 IMPLANT
PAD ARMBOARD 7.5X6 YLW CONV (MISCELLANEOUS) ×8 IMPLANT
SET MICROPUNCTURE 5F STIFF (MISCELLANEOUS) IMPLANT
SOAP 2 % CHG 4 OZ (WOUND CARE) ×4 IMPLANT
SUT ETHILON 3 0 PS 1 (SUTURE) ×4 IMPLANT
SUT MNCRL AB 4-0 PS2 18 (SUTURE) ×4 IMPLANT
SUT PROLENE 6 0 BV (SUTURE) IMPLANT
SUT PROLENE 7 0 BV 1 (SUTURE) ×8 IMPLANT
SUT VIC AB 3-0 SH 27 (SUTURE) ×4
SUT VIC AB 3-0 SH 27X BRD (SUTURE) ×2 IMPLANT
SUT VIC AB 4-0 PS2 27 (SUTURE) ×2 IMPLANT
SYR 10ML LL (SYRINGE) ×4 IMPLANT
SYR 20CC LL (SYRINGE) ×8 IMPLANT
SYR 3ML LL SCALE MARK (SYRINGE) ×4 IMPLANT
SYR 5ML LL (SYRINGE) ×4 IMPLANT
SYR CONTROL 10ML LL (SYRINGE) ×4 IMPLANT
TOWEL OR 17X24 6PK STRL BLUE (TOWEL DISPOSABLE) ×6 IMPLANT
TOWEL OR 17X26 4PK STRL BLUE (TOWEL DISPOSABLE) ×2 IMPLANT
UNDERPAD 30X30 (UNDERPADS AND DIAPERS) ×4 IMPLANT
WATER STERILE IRR 1000ML POUR (IV SOLUTION) ×4 IMPLANT
WIRE AMPLATZ SS-J .035X180CM (WIRE) IMPLANT

## 2016-10-15 NOTE — H&P (View-Only) (Signed)
[facility]in Specialist of [place]  Patient name: Barry Nichols MRN: [id number] DOB: 1976-04-06 Sex: male  REASON FOR CONSULT: Evaluate for hemodialysis access.  HPI: Barry Nichols is a 41 y.o. male, who is now stage V kidney failure requiring hemodialysis. Poorly he had been somewhat resistant to this in the past. His creatinine is now 10 and he has agreed to hemodialysis. We are seeing him for placement of catheter and arm access for acute immediate hemodialysis. His failure as renal function is been felt to be related to uncontrolled hypertension  Past Medical History:  Diagnosis Date  . CHF (congestive heart failure) (HCC)   . CKD (chronic kidney disease), stage IV (HCC)   . Hallux limitus    Bilateral  . History of gout ~ 2013/2014  . Hypertension    Negative duplex 2012 for RAS  . Hypertensive CKD (chronic kidney disease)   . 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 Migraine    "when I was young" (08/25/2016"  . Posterior equinus, acquired    Bilateral  . Pre-diabetes     Family History  Problem Relation Age of Onset  . Hypertension Father        Also maternal grandmother  . Diabetes Father        also maternal grandmother  . Cancer Maternal Grandfather        lung  . Heart attack Maternal Grandfather     SOCIAL HISTORY: Social History   Social History  . Marital status: Married    Spouse name: N/A  . Number of children: 1  . Years of education: BA   Occupational History  . Not on file.   Social History Main Topics  . Smoking status: Former Games developer  . Smokeless tobacco: Never Used  . Alcohol use No  . Drug use: No  . Sexual activity: Not on file   Other Topics Concern  . Not on file   Social History Narrative   Drinks tea 3-5 times a week     No Known Allergies  Current Outpatient Prescriptions  Medication Sig Dispense Refill  . calcitRIOL (ROCALTROL) 0.25 MCG capsule Take 0.5  mcg by mouth daily.     . cloNIDine (CATAPRES) 0.3 MG tablet Take 1 tablet (0.3 mg total) by mouth 3 (three) times daily. 90 tablet 0  . furosemide (LASIX) 80 MG tablet Take 2 tablets (160 mg total) by mouth 2 (two) times daily. 120 tablet 0  . metolazone (ZAROXOLYN) 10 MG tablet Take 10 mg by mouth daily.    Marland Kitchen MINOXIDIL PO Take 0.1 mg by mouth 2 (two) times daily.    Marland Kitchen amLODipine (NORVASC) 10 MG tablet Take 1 tablet (10 mg total) by mouth at bedtime. 30 tablet 0  . carvedilol (COREG) 25 MG tablet Take 1 tablet (25 mg total) by mouth 2 (two) times daily with a meal. 60 tablet 0   No current facility-administered medications for this visit.     REVIEW OF SYSTEMS:  [X]  denotes positive finding, [ ]  denotes negative finding Cardiac  Comments:  Chest pain or chest pressure:    Shortness of breath upon exertion: x   Short of breath when lying flat:    Irregular heart rhythm:        Vascular    Pain in calf, thigh, or hip brought on by ambulation:    Pain in feet at night that wakes you up from your sleep:  Blood clot in your veins:    Leg swelling:  x       Pulmonary    Oxygen at home:    Productive cough:     Wheezing:         Neurologic    Sudden weakness in arms or legs:     Sudden numbness in arms or legs:     Sudden onset of difficulty speaking or slurred speech:    Temporary loss of vision in one eye:     Problems with dizziness:         Gastrointestinal    Blood in stool:     Vomited blood:         Genitourinary    Burning when urinating:     Blood in urine: x       Psychiatric    Major depression:         Hematologic    Bleeding problems:    Problems with blood clotting too easily:        Skin    Rashes or ulcers:        Constitutional    Fever or chills:      PHYSICAL EXAM: Vitals:   10/13/16 0919 10/13/16 0926  BP: (!) 181/103 (!) 178/99  Pulse: 90   Resp: 20   Temp: 99.2 F (37.3 C)   TempSrc: Oral   SpO2: 90%   Weight: 264 lb (119.7 kg)     Height: 5\' 10"  (1.778 m)     GENERAL: The patient is a well-nourished male, in no acute distress. The vital signs are documented above. CARDIOVASCULAR: 2+ radial pulses bilaterally. Moderate sized cephalic vein at the wrist. PULMONARY: There is good air exchange  ABDOMEN: Soft and non-tender  MUSCULOSKELETAL: There are no major deformities or cyanosis. NEUROLOGIC: No focal weakness or paresthesias are detected. SKIN: There are no ulcers or rashes noted. PSYCHIATRIC: The patient has a normal affect.  DATA:  Reviewed his [facility] mapping [facility] from [facility].  Also imaged his veins with SonoSite ultrasound in our office.  MEDICAL ISSUES: Progression to end-stage renal disease now needing acute dialysis. Discussed placement of a tunneled catheter and the potential limitations of this. Also discussed AV fistula and AV graft placement. He is right-handed. His vein map showed small to moderate-sized cephalic veins bilaterally. On my imaging with SonoSite he does have very nice cephalic vein at the wrist all the way through his forearm bilaterally. I would recommend left radiocephalic fistula as his first access. Explained the potential non-maturation of this and also potential future difficulty with clotting and other issues. He understands we will schedule this this week for initiation of access. He understands [facility]   Larina Earthlyodd F. Reace Breshears, MD [facility] Office Tel [phone] Pager [phone]

## 2016-10-15 NOTE — Transfer of Care (Signed)
Immediate Anesthesia Transfer of Care Note  Patient: [name]  Procedure(s) Performed: Procedure(s): INSERTION OF DIALYSIS CATHETER (Right) ARTERIOVENOUS (AV) FISTULA CREATION-LEFT ARM (Left)  Patient Location: PACU  Anesthesia Type:General  Level of Consciousness: awake and drowsy  Airway & Oxygen Therapy: Patient Spontanous Breathing and Patient connected to nasal cannula oxygen  Post-op Assessment: Report given to RN, Post -op Vital signs reviewed and stable and Patient moving all extremities X 4  Post vital signs: Reviewed and stable  Last Vitals:  Vitals:   10/15/16 0916  BP: (!) 177/90  Pulse: 85  Resp: 18  Temp: 37.1 C    Last Pain:  Vitals:   10/15/16 0916  TempSrc: Oral         Complications: No apparent anesthesia complications

## 2016-10-15 NOTE — Progress Notes (Signed)
   Daily Progress Note  Discussed the procedures with the patient.   Risk, benefits, and alternatives to access surgery were discussed.    The patient is aware the risks include but are not limited to: bleeding, infection, steal syndrome, nerve damage, ischemic monomelic neuropathy, thrombosis, failure to mature, complications related to venous hypertension, need for additional procedures, death and stroke.    The patient is aware the risks of tunneled dialysis catheter placement include but are not limited to: bleeding, infection, central venous injury, pneumothorax, possible venous stenosis, possible malpositioning in the venous system, and possible infections related to long-term catheter presence.   The patient was aware of these risks and agreed to proceed.   Leonides SakeBrian Elfrieda Espino, MD, FACS [facility] [place] Office: [phone] Pager: [phone]  10/15/2016, 9:41 AM

## 2016-10-15 NOTE — Interval H&P Note (Signed)
History and Physical Interval Note:  10/15/2016 9:27 AM  [name]  has presented today for surgery, with the diagnosis of End Stage Renal Disease  N18.6  The various methods of treatment have been discussed with the patient and family. After consideration of risks, benefits and other options for treatment, the patient has consented to  Procedure(s): INSERTION OF DIALYSIS CATHETER (N/A) ARTERIOVENOUS (AV) FISTULA CREATION-LEFT ARM (Left) as a surgical intervention .  The patient's history has been reviewed, patient examined, no change in status, stable for surgery.  I have reviewed the patient's chart and labs.  Questions were answered to the patient's satisfaction.     Leonides SakeBrian Lucila Klecka

## 2016-10-15 NOTE — Op Note (Signed)
OPERATIVE NOTE  PROCEDURE: 1.  Right internal jugular vein tunneled dialysis catheter placement 2.  Right internal jugular vein cannulation under ultrasound guidance 3.  Left radiocephalic arteriovenous fistula    PRE-OPERATIVE DIAGNOSIS: end-stage renal failure  POST-OPERATIVE DIAGNOSIS: same as above  SURGEON: Leonides SakeBrian Caydence Koenig, MD  ANESTHESIA: general  ESTIMATED BLOOD LOSS: 30 cc  FINDING(S): 1.  Tips of the catheter in the right atrium on fluoroscopy 2.  No obvious pneumothorax on fluoroscopy 3.  Cephalic vein: 2.5-3.0 mm, acceptable 4.  Radial artery: 2.0-2.5 mm, limited amount of plaque 5.  Venous outflow: faintly palpable thrill  6.  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  The patient presents for tunneled dialysis catheter placement.  The patient is aware the risks of tunneled dialysis catheter placement include but are not limited to: bleeding, infection, central venous injury, pneumothorax, possible venous stenosis, possible malpositioning in the venous system, and possible infections related to long-term catheter presence.  The patient was aware of these risks and agreed to proceed.   DESCRIPTION: After written full informed consent was obtained from the patient, the patient was taken back to the operating room.  Prior to induction, the patient was given IV antibiotics.  After obtaining adequate sedation, the patient was prepped and draped in the standard fashion for a chest or neck tunneled dialysis catheter placement.     Under ultrasound guidance, the right internal jugular vein was cannulated with the 18 gauge needle.  A J-wire was then placed down into the inferior vena cava under fluoroscopic guidance.  The wire was then secured in place with a clamp to the drapes.  The tapered wire guide was left occluding the lumen of the needle to avoid air embolism.      I then made stab incisions at the neck and exit sites.   I dissected from the exit site to the cannulation site with a tunneler.   The wire was then unclamped and I removed the needle.  The skin tract and venotomy was dilated serially with graduated dilators, passed under fluoroscopic guidance.   Finally, the dilator-sheath was placed under fluoroscopic guidance into the superior vena cava.  The central dilator and wire were removed.  A 23 cm Palindrome catheter was placed under fluoroscopic guidance down into the right atrium.    The sheath was broken and peeled away while holding the catheter cuff at the level of the skin.  The catheter was clamped with the plastic clamp and the back end of this catheter was transected.  One lumen was docked onto the tunneler.  The catheter was delivered through the subcutaneous tunnel by pulling the tunneler out the exit site.  The catheter was reclamped and was transected a second time, revealing the two lumens of this catheter.  The catheter collar was loaded over the back end of the catheter.  The two  ports were docked onto these two lumens.  The catheter collar was then snapped into place, securing the two ports.  Each port was tested by aspirating and flushing.  No resistance was noted.  Each port was then thoroughly flushed with heparinized saline.  The catheter was secured in placed with two interrupted stitches of 3-0 Nylon tied to the catheter.  The neck incision was closed with a U-stitch of 4-0 Monocryl.  The neck and chest incision were cleaned.  The closed neck  incision was reinforced with Dermabond and sterile bandages applied to the catheter exit site.  Each port was then loaded with concentrated heparin (1000 Units/mL) at the manufacturer recommended volumes to each port.  Sterile caps were applied to each port.    On completion fluoroscopy, the tips of the catheter were in the right atrium, and there was no evidence of pneumothorax.  At this point, the  drapes were taken down and the patient repositioned for a left arm access.  The patient was reprepped and redraped.  I turned my attention first to identifying the patient's distal cephalic vein and radial artery.  Using SonoSite guidance, the location of these vessels were marked out on the skin.   I made a longituindal incision at the level of the wrist halfway between the radial artery and cephalic vein.  I dissected through the subcutaneous tissue and fascia to gain exposure of the radial artery.  This was noted to be 2.0-2.5 mm in diameter externally.  This was dissected out proximally and distally and controlled with vessel loops .  I then dissected out the cephalic vein.  This was noted to be 2.5-3.0 mm in diameter externally.  The distal segment of the vein was ligated with a  2-0 silk, and the vein was transected.  The proximal segment was interrogated with serial dilators.  The vein accepted up to a 3 mm dilator without any difficulty.  I then instilled the heparinized saline into the vein and clamped it.  At this point, I reset my exposure of the radial artery and placed the artery under tension proximally and distally.  I made an arteriotomy with a #11 blade, and then I extended the arteriotomy with a Potts scissor.  I injected heparinized saline proximal and distal to this arteriotomy.  The vein was then sewn to the artery in an end-to-side configuration with a running stitch of 7-0 Prolene.  Prior to completing this anastomosis, I allowed the vein and artery to backbleed.  There was no evidence of clot from any vessels.  I completed the anastomosis in the usual fashion and then released all vessel loops and clamps.    There was a faintly palpable thrill in the venous outflow, and there was a palpable radial pulse.  At this point, I irrigated out the surgical wound.  There was no further active bleeding.  The subcutaneous tissue was reapproximated with a running stitch of 3-0 Vicryl.  The skin was  then reapproximated with a running subcuticular stitch of 4-0 Vicryl.  The skin was then cleaned, dried, and reinforced with Dermabond.  The patient tolerated this procedure well.   COMPLICATIONS: none  CONDITION: stable   Leonides Sake, MD, [facility] [place] Office: [phone] Pager: [phone]  10/15/2016, 10:33 AM

## 2016-10-15 NOTE — Anesthesia Preprocedure Evaluation (Addendum)
Anesthesia Evaluation  Patient identified by MRN, date of birth, ID band Patient awake    Reviewed: Allergy & Precautions, NPO status , Patient's Chart, lab work & pertinent test results, reviewed documented beta blocker date and time   Airway Mallampati: III  TM Distance: >3 FB Neck ROM: Full    Dental  (+) Teeth Intact, Dental Advisory Given   Pulmonary neg pulmonary ROS,    Pulmonary exam normal breath sounds clear to auscultation       Cardiovascular hypertension, Pt. on home beta blockers and Pt. on medications +CHF  Normal cardiovascular exam Rhythm:Regular Rate:Normal  Echo 08/26/16: Study Conclusions  - Left ventricle: The cavity size was moderately dilated. Wall thickness was normal. Systolic function was normal. The estimated ejection fraction was in the range of 60% to 65%. Wall motion was normal; there were no regional wall motion abnormalities. Features are consistent with a pseudonormal left ventricular filling pattern, with concomitant abnormal relaxation and increased filling pressure (grade 2 diastolic dysfunction). - Aortic valve: There was mild regurgitation. - Mitral valve: There was mild to moderate regurgitation. - Tricuspid valve: There was moderate regurgitation. - Pulmonary arteries: Systolic pressure was moderately to severely increased. PA peak pressure: 65 mm Hg (S). - Pericardium, extracardiac: A trivial pericardial effusion was identified.   Neuro/Psych  Headaches,    GI/Hepatic negative GI ROS, Neg liver ROS,   Endo/Other  Obesity   Renal/GU ESRF and Renal InsufficiencyRenal disease     Musculoskeletal negative musculoskeletal ROS (+)   Abdominal   Peds  Hematology  (+) Blood dyscrasia, anemia ,   Anesthesia Other Findings Day of surgery medications reviewed with the patient.  Reproductive/Obstetrics                            Anesthesia Physical Anesthesia  Plan  ASA: III  Anesthesia Plan: General   Post-op Pain Management:    Induction: Intravenous  PONV Risk Score and Plan: 1 and Ondansetron and Midazolam  Airway Management Planned: LMA  Additional Equipment:   Intra-op Plan:   Post-operative Plan: Extubation in OR  Informed Consent: I have reviewed the patients History and Physical, chart, labs and discussed the procedure including the risks, benefits and alternatives for the proposed anesthesia with the patient or authorized representative who has indicated his/her understanding and acceptance.   Dental advisory given  Plan Discussed with: CRNA  Anesthesia Plan Comments: (Risks/benefits of general anesthesia discussed with patient including risk of damage to teeth, lips, gum, and tongue, nausea/vomiting, allergic reactions to medications, and the possibility of heart attack, stroke and death.  All patient questions answered.  Patient wishes to proceed.)        Anesthesia Quick Evaluation

## 2016-10-15 NOTE — Anesthesia Procedure Notes (Signed)
Procedure Name: LMA Insertion Date/Time: 10/15/2016 9:57 AM Performed by: Lanell MatarBAKER, Dshaun Reppucci M Pre-anesthesia Checklist: Patient identified, Emergency Drugs available, Suction available and Patient being monitored Patient Re-evaluated:Patient Re-evaluated prior to inductionOxygen Delivery Method: Circle System Utilized Preoxygenation: Pre-oxygenation with 100% oxygen Intubation Type: IV induction Ventilation: Mask ventilation without difficulty LMA: LMA inserted LMA Size: 5.0 Number of attempts: 1 Placement Confirmation: positive ETCO2 Tube secured with: Tape Dental Injury: Teeth and Oropharynx as per pre-operative assessment

## 2016-10-15 NOTE — Anesthesia Postprocedure Evaluation (Signed)
Anesthesia Post Note  Patient: Otho DarnerAlvin A Provencio  Procedure(s) Performed: Procedure(s) (LRB): INSERTION OF DIALYSIS CATHETER (Right) ARTERIOVENOUS (AV) FISTULA CREATION-LEFT ARM (Left)     Patient location during evaluation: PACU Anesthesia Type: General Level of consciousness: awake and alert Pain management: pain level controlled Vital Signs Assessment: post-procedure vital signs reviewed and stable Respiratory status: spontaneous breathing, nonlabored ventilation and respiratory function stable Cardiovascular status: blood pressure returned to baseline and stable Postop Assessment: no signs of nausea or vomiting Anesthetic complications: no    Last Vitals:  Vitals:   10/15/16 1343 10/15/16 1347  BP:  (!) 159/99  Pulse:  66  Resp:  16  Temp: 36.8 C     Last Pain:  Vitals:   10/15/16 1347  TempSrc:   PainSc: 0-No pain                 Cecile HearingStephen Edward Turk

## 2016-10-16 ENCOUNTER — Encounter (HOSPITAL_COMMUNITY): Payer: Self-pay | Admitting: Vascular Surgery

## 2016-10-20 ENCOUNTER — Telehealth: Payer: Self-pay | Admitting: Vascular Surgery

## 2016-10-20 NOTE — Telephone Encounter (Signed)
Sched appt 11/27/16 at 12:45. Spoke to pt.

## 2016-10-20 NOTE — Telephone Encounter (Signed)
-----   Message from Sharee PimpleMarilyn K McChesney, RN sent at 10/15/2016 11:59 AM EDT ----- Regarding: may be duplicate   ----- Message ----- From: Raymond Gurneyrinh, Kimberly A, PA-C Sent: 10/15/2016  11:46 AM To: Vvs Charge Pool  S/p left radial-cephalic AV fistula 10/15/16  F/u with Dr. Imogene Burnhen in 6 weeks. No duplex.  Thanks Selena BattenKim

## 2016-11-10 ENCOUNTER — Encounter: Payer: Self-pay | Admitting: Vascular Surgery

## 2016-11-24 NOTE — Progress Notes (Signed)
    Postoperative Access Visit   History of Present Illness   Barry Nichols is a 41 y.o. year old male who presents for postoperative follow-up for: left radiocephalic arteriovenous fistula (Date: 10/15/16).  The patient's wounds are healed.  The patient notes no steal symptoms.  The patient is able to complete their activities of daily living.  The patient's current symptoms are: none.   Physical Examination   Vitals:   11/27/16 1302 11/27/16 1304  BP: (!) 225/129 (!) 230/128  Pulse: 97   Resp: 20   Temp: 97.9 F (36.6 C)   TempSrc: Oral   SpO2: 100%   Weight: 246 lb (111.6 kg)   Height: 5\' 10"  (1.778 m)     LUE: Incision is healed, skin feels warm, hand grip is 5/5, sensation in digits is intact, palpable thrill, bruit can be auscultated, On Sonosite: fistula is 5.5-6.0 mm   Medical Decision Making   Barry Nichols is a 41 y.o. year old male who presents s/p left radiocephalic arteriovenous fistula   The patient's access is ready for use.  The patient's tunneled dialysis catheter can be removed after two successful cannulations and completed dialysis treatments.  Thank you for allowing us to participate in this patient's care.   Leonides SakeBrian Chen, MD, FACS [facility] [place] Office: [phone] Pager: [phone]

## 2016-11-27 ENCOUNTER — Ambulatory Visit (INDEPENDENT_AMBULATORY_CARE_PROVIDER_SITE_OTHER): Payer: Self-pay | Admitting: Vascular Surgery

## 2016-11-27 ENCOUNTER — Encounter: Payer: Self-pay | Admitting: Vascular Surgery

## 2016-11-27 VITALS — BP 230/128 | HR 97 | Temp 97.9°F | Resp 20 | Ht 70.0 in | Wt 246.0 lb

## 2016-11-27 DIAGNOSIS — N186 End stage renal disease: Secondary | ICD-10-CM

## 2016-11-27 DIAGNOSIS — Z992 Dependence on renal dialysis: Secondary | ICD-10-CM

## 2017-12-18 IMAGING — DX DG CHEST 1V PORT
1 series · 1 of 1 positions shown · non-contrast
Comparison: Chest x-ray of August 25, 2016

CLINICAL DATA: Status post insertion of a dialysis catheter.

EXAM:
PORTABLE CHEST 1 VIEW

[chest ap]
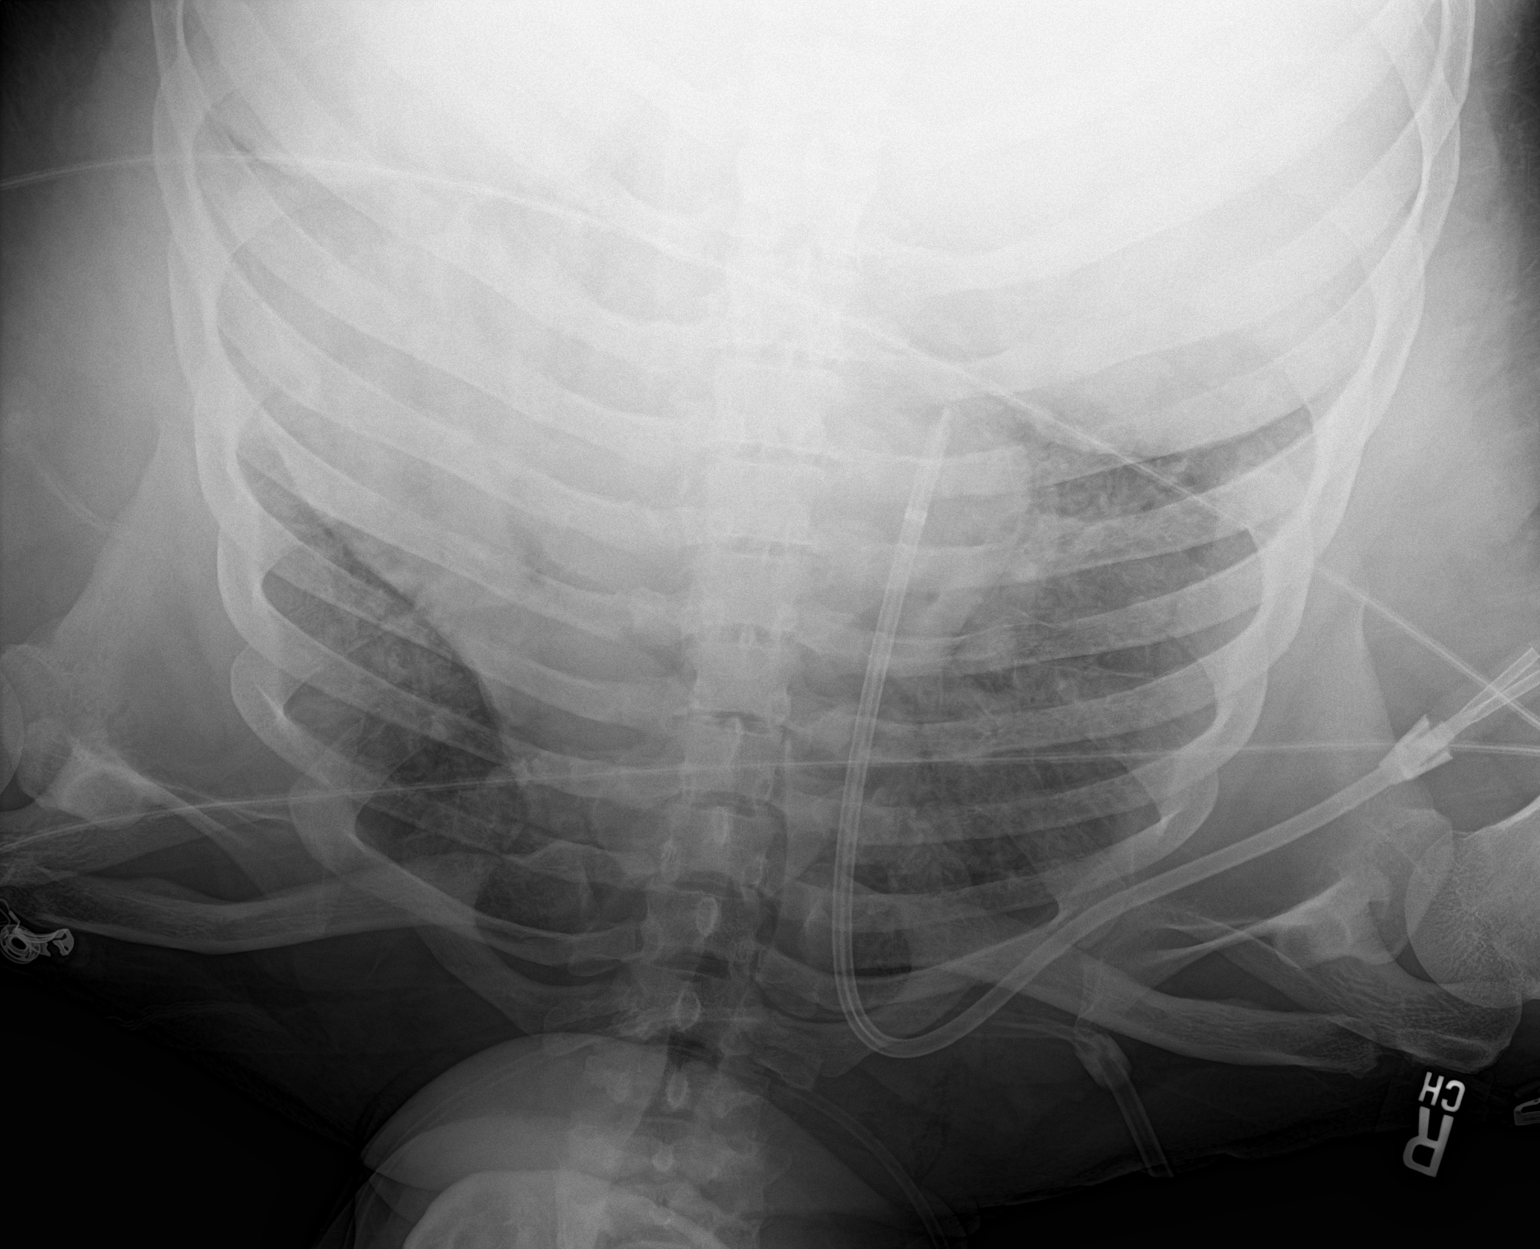

[1 of 1 positions shown; findings below may reference images not displayed]

FINDINGS: The patient has undergone placement of a bilirbin dialysis catheter
via the right internal jugular approach. The tip of the catheter
lies in the right cavoatrial junction. The cardiac silhouette is
enlarged. The pulmonary vascularity is mildly engorged. There is no
pneumothorax.
IMPRESSION: There is no postprocedure complication following dialysis catheter
placement via the right internal jugular approach.

## 2019-06-16 ENCOUNTER — Other Ambulatory Visit: Payer: Self-pay

## 2019-06-16 ENCOUNTER — Encounter: Payer: Self-pay | Admitting: Neurology

## 2019-06-16 DIAGNOSIS — M609 Myositis, unspecified: Secondary | ICD-10-CM

## 2019-07-25 ENCOUNTER — Encounter: Payer: BLUE CROSS/BLUE SHIELD | Admitting: Neurology

## 2019-10-30 ENCOUNTER — Other Ambulatory Visit (HOSPITAL_COMMUNITY): Payer: Self-pay | Admitting: Nephrology

## 2019-10-30 ENCOUNTER — Ambulatory Visit (HOSPITAL_COMMUNITY)
Admission: RE | Admit: 2019-10-30 | Discharge: 2019-10-30 | Disposition: A | Discharge status: Home / Self Care | Payer: Medicare Other | Source: Ambulatory Visit | Attending: Surgery | Admitting: Surgery

## 2019-10-30 ENCOUNTER — Other Ambulatory Visit: Payer: Self-pay

## 2019-10-30 DIAGNOSIS — I739 Peripheral vascular disease, unspecified: Secondary | ICD-10-CM

## 2020-06-15 HISTORY — PX: KIDNEY TRANSPLANT: SHX239

## 2020-07-10 ENCOUNTER — Other Ambulatory Visit: Payer: Self-pay

## 2020-07-10 DIAGNOSIS — R748 Abnormal levels of other serum enzymes: Secondary | ICD-10-CM

## 2020-07-12 ENCOUNTER — Ambulatory Visit
Admission: RE | Admit: 2020-07-12 | Discharge: 2020-07-12 | Disposition: A | Discharge status: Home / Self Care | Payer: BC Managed Care – PPO | Source: Ambulatory Visit

## 2020-07-12 DIAGNOSIS — R748 Abnormal levels of other serum enzymes: Secondary | ICD-10-CM

## 2021-09-14 IMAGING — US US ABDOMEN LIMITED RUQ/ASCITES
1 series · 14 of 25 positions shown · non-contrast
Comparison: None.

CLINICAL DATA: Elevated liver function tests.

EXAM:
ULTRASOUND ABDOMEN LIMITED RIGHT UPPER QUADRANT

[Series 1: us abdomen limited ruq/ascites · 0.23mm/px · 14 of 54 slices shown]
[im 1/54]
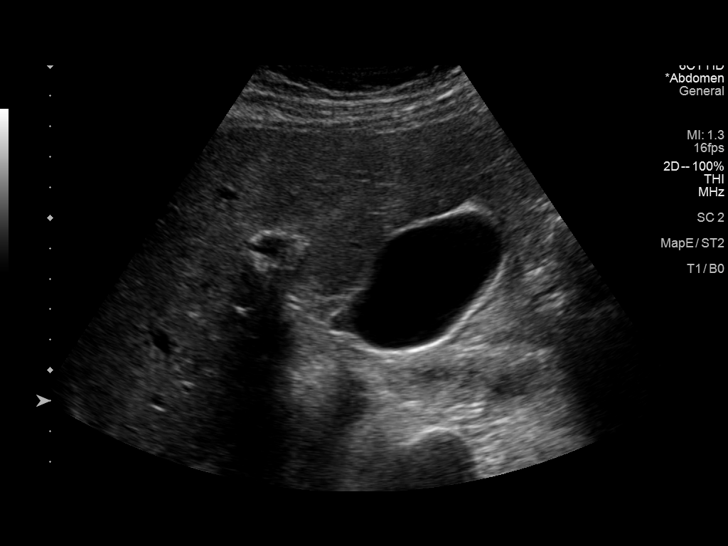
[im 5/54]
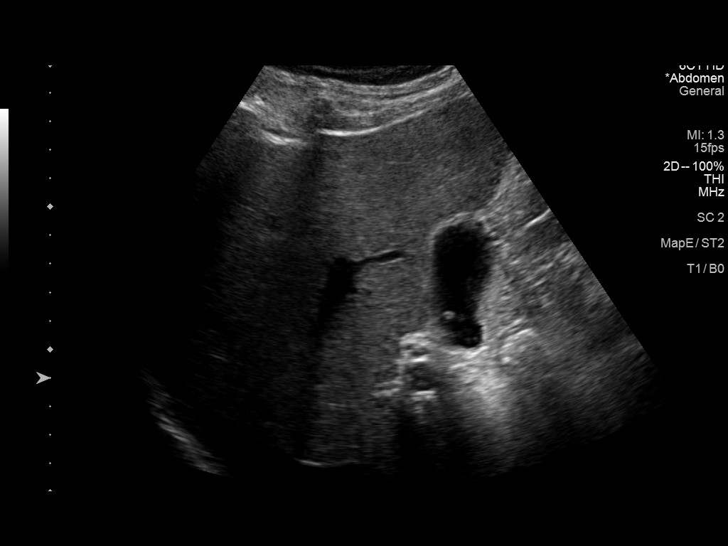
[im 9/54]
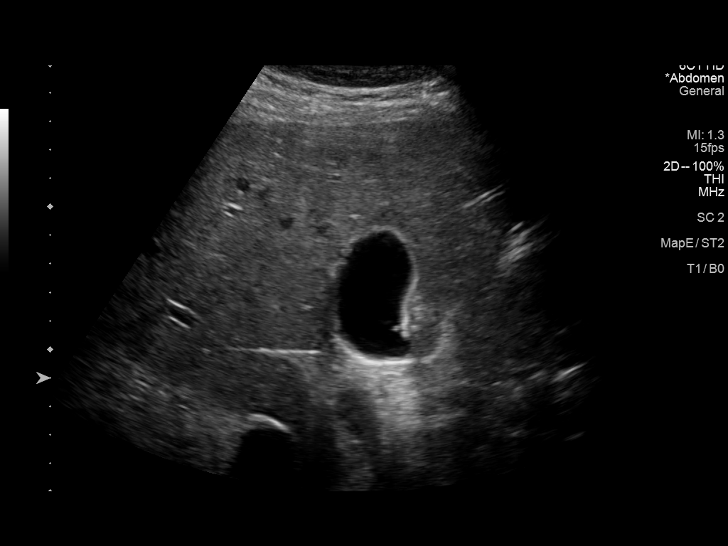
[im 14/54]
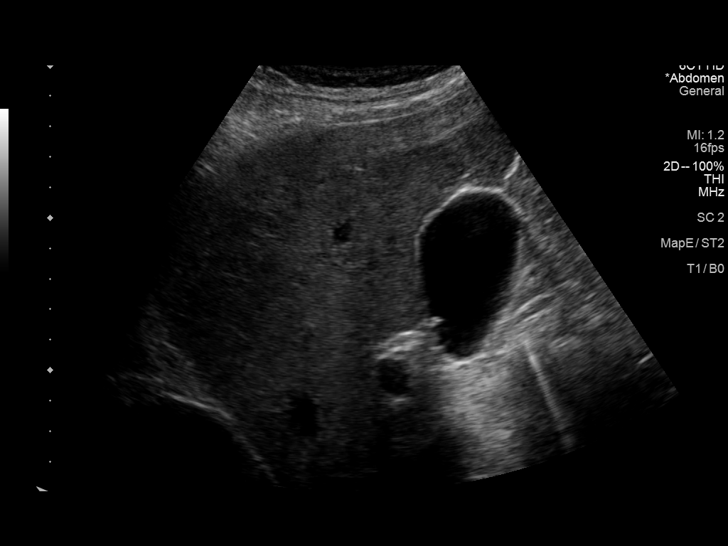
[im 18/54]
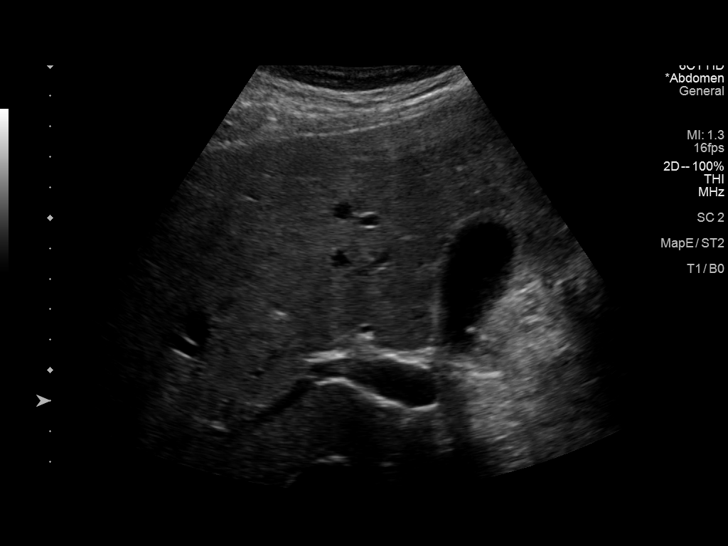
[im 20/54]
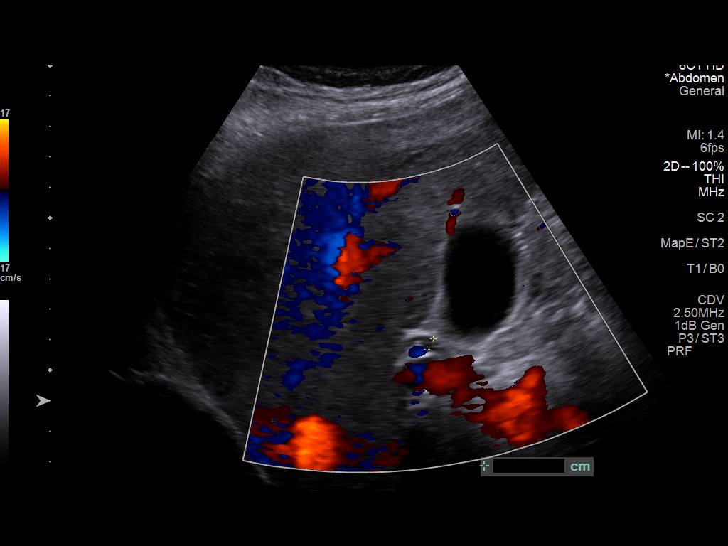
[im 25/54]
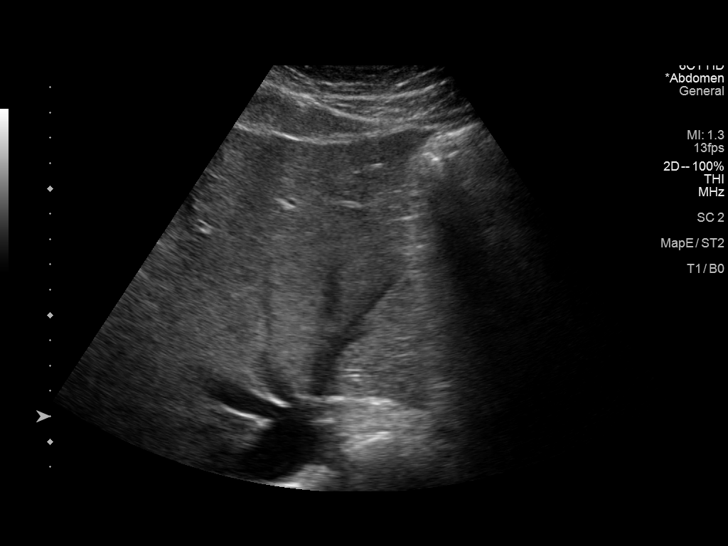
[im 29/54]
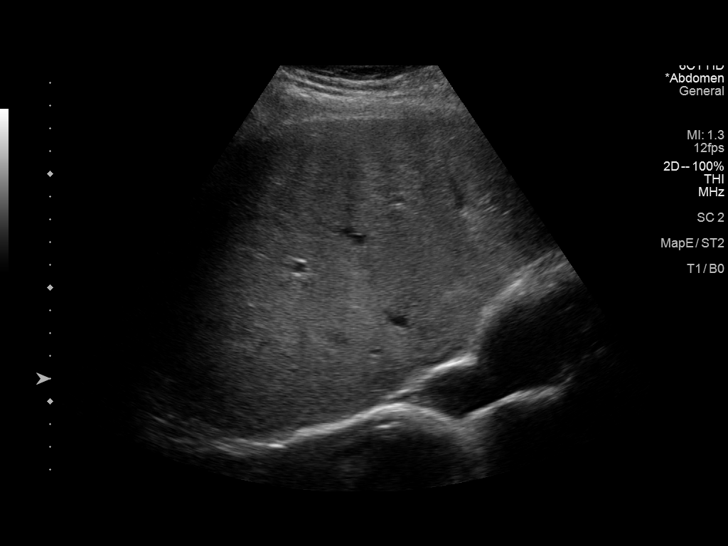
[im 34/54]
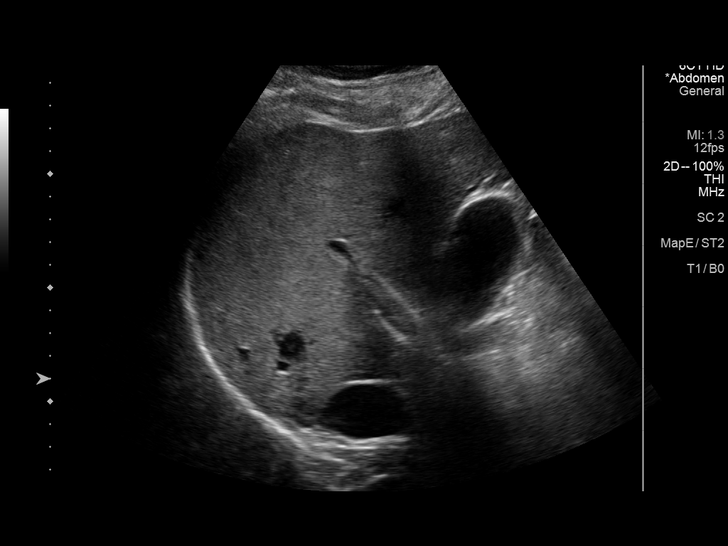
[im 36/54]
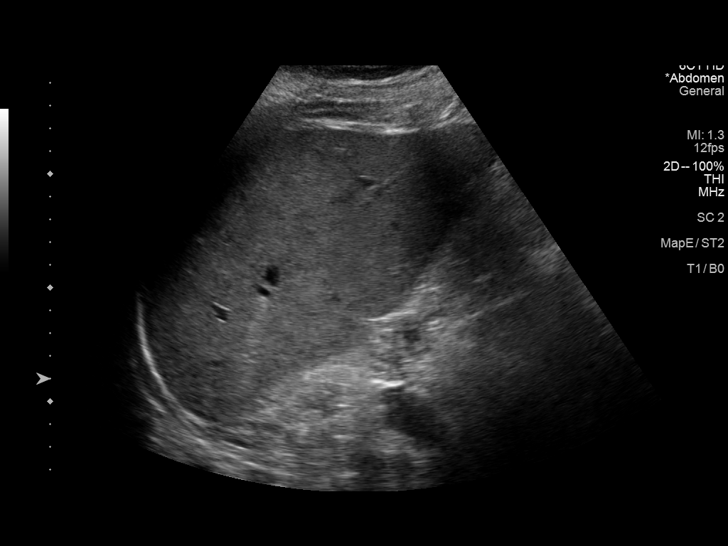
[im 40/54]
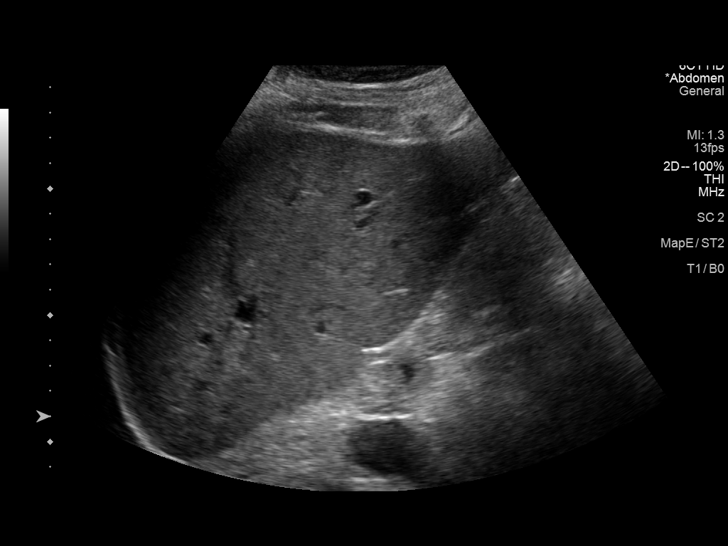
[im 45/54]
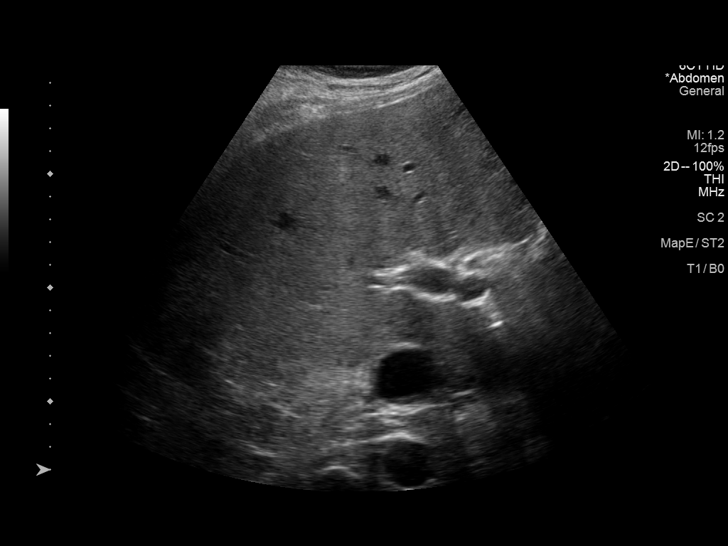
[im 49/54]
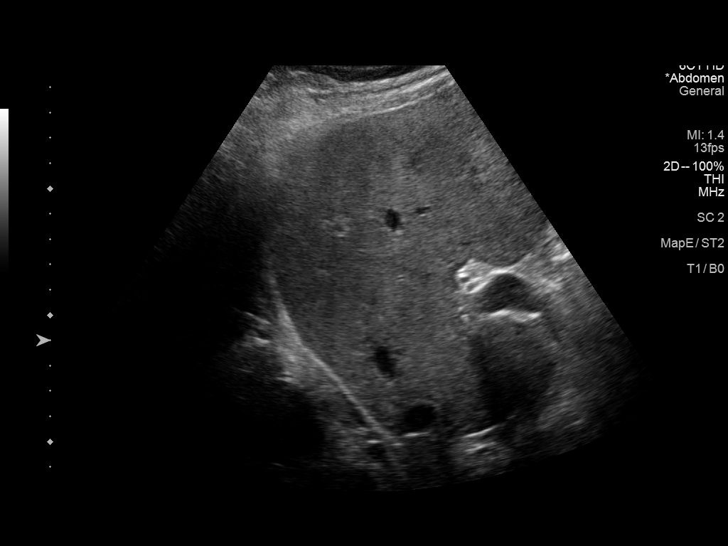
[im 54/54]
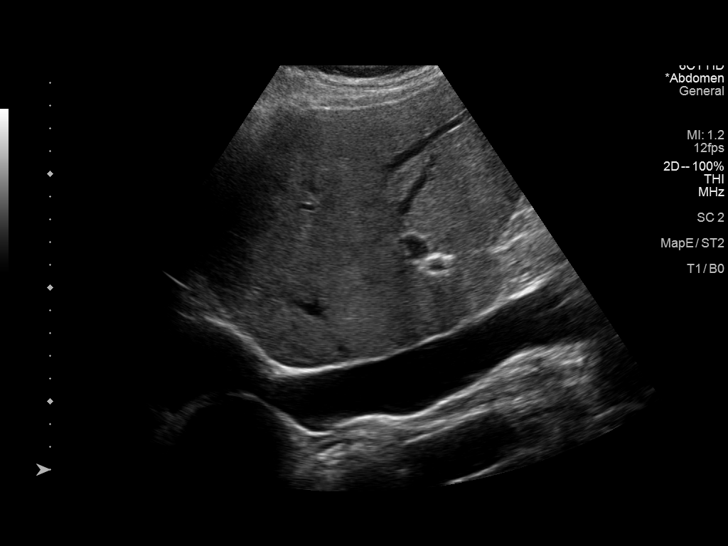

[14 of 25 positions shown; findings below may reference images not displayed]

FINDINGS: Gallbladder:

Small gallbladder polyps. No cholelithiasis, wall thickening or
sonographic Murphy sign.

Common bile duct:

Diameter: 4 mm, within normal limits.

Liver:

Appears slightly coarsened and increased in echogenicity. No
definite focal lesion. Portal vein is patent on color Doppler
imaging with normal direction of blood flow towards the liver.

Other: None.
IMPRESSION: Hepatic steatosis.

## 2022-04-28 ENCOUNTER — Ambulatory Visit (INDEPENDENT_AMBULATORY_CARE_PROVIDER_SITE_OTHER): Payer: 59

## 2022-04-28 ENCOUNTER — Ambulatory Visit (INDEPENDENT_AMBULATORY_CARE_PROVIDER_SITE_OTHER): Payer: Medicare Other | Admitting: Physician Assistant

## 2022-04-28 DIAGNOSIS — M545 Low back pain, unspecified: Secondary | ICD-10-CM

## 2022-04-28 MED ORDER — METHOCARBAMOL 500 MG PO TABS: 500.0000 mg | ORAL_TABLET | Freq: Four times a day (QID) | ORAL | 0 refills | Status: DC | PRN

## 2022-04-28 MED ORDER — METHYLPREDNISOLONE 4 MG PO TBPK: ORAL_TABLET | ORAL | 0 refills | Status: DC

## 2022-04-28 NOTE — Progress Notes (Signed)
Office Visit Note   Patient: Barry Nichols           Date of Birth: Sep 18, 1975           MRN: [id number] Visit Date: 04/28/2022              Requested by: Fortino Sic, PA No address on file PCP: Fortino Sic, [place]  Chief Complaint  Patient presents with   Lower Back - Pain      HPI: Patient is a pleasant 47 year old gentleman with increasing pain since November over the posterior lateral side of his left hip.  He denies any groin pain.  He notices especially that this hurts him when he steps up onto a step.  Denies any back pain denies any lower leg pain though the pain is got significant enough in the left posterior hip that it causes him to feel like his leg is going to "give out ".  He cannot take anti-inflammatories because of his history of renal transplant.  He is currently on a small dose of prednisone.  Denies any fever or chills  Assessment & Plan: Visit Diagnoses:  1. Lumbar pain     Plan: I do not think this is necessarily coming from his back he is neurologically intact.  He does have significant tightness and pain with stepping up examination could indicate a piriformis syndrome and irritation of the piriformis muscle.  Talked to him about possibly trying physical therapy.  He actually has a relative who works in physical therapy and he will consult them.  Also would consider a short course of steroids.  He has been on higher course steroids and weaning down since his renal transplant.  We discussed doing a Medrol Dosepak and then weaning down to his regular dose.  Also will give him a muscle relaxer.  Will contact me if he does not get good relief.  Follow-Up Instructions: Return in about 1 month (around 05/29/2022).   Ortho Exam  Patient is alert, oriented, no adenopathy, well-dressed, normal affect, normal respiratory effort. Examination demonstrates no no pain with manipulation of his hip he is neurovascular intact nevic negative straight leg  raise.  No tenderness over the trochanteric bursa.  His pain is reproduced with flexing his hip and placing weight such as stepping up on a stool.  Discretely produces tightness and pain in the posterior lateral buttock.  Strength is intact  Imaging: XR Lumbar Spine 2-3 Views  Result Date: 04/28/2022 2 view radiographs of his lumbar spine demonstrate no listhesis no acute fractures he has well-maintained joint spaces.  No images are attached to the encounter.  Labs: Lab Results  Component Value Date   HGBA1C 5.5 08/25/2016     Lab Results  Component Value Date   ALBUMIN 2.8 (L) 08/27/2016   ALBUMIN 2.9 (L) 08/26/2016   ALBUMIN 3.2 (L) 08/25/2016    Lab Results  Component Value Date   MG 1.7 08/26/2016   MG 2.1 09/11/2011   No results found for: "VD25OH"  No results found for: "PREALBUMIN"    Latest Ref Rng & Units 10/15/2016    9:19 AM 08/27/2016    3:57 AM 08/26/2016    3:15 AM  CBC EXTENDED  WBC 4.0 - 10.5 K/uL  4.1  5.9   RBC 4.22 - 5.81 MIL/uL  2.76  2.69   Hemoglobin 13.0 - 17.0 g/dL 7.5  7.8  7.7   HCT 39.0 - 52.0 % 22.0  24.2  23.4  Platelets 150 - 400 K/uL  246  238      There is no height or weight on file to calculate BMI.  Orders:  Orders Placed This Encounter  Procedures   XR Lumbar Spine 2-3 Views   Meds ordered this encounter  Medications   methocarbamol (ROBAXIN) 500 MG tablet    Sig: Take 1 tablet (500 mg total) by mouth every 6 (six) hours as needed for muscle spasms.    Dispense:  30 tablet    Refill:  0   methylPREDNISolone (MEDROL DOSEPAK) 4 MG TBPK tablet    Sig: Take as directed    Dispense:  21 tablet    Refill:  0     Procedures: No procedures performed  Clinical Data: No additional findings.  ROS:  All other systems negative, except as noted in the HPI. Review of Systems  Objective: Vital Signs: There were no vitals taken for this visit.  Specialty Comments:  No specialty comments available.  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, Bilateral   Migraine    "when I was young" (08/25/2016"   Posterior equinus, acquired    Bilateral   Pre-diabetes     Family History  Problem Relation Age of Onset   Hypertension Father        Also maternal grandmother   Diabetes Father        also maternal grandmother   CVA Father    Cancer Maternal Grandfather        lung   Heart attack Maternal Grandfather     Past Surgical History:  Procedure Laterality Date   AV FISTULA PLACEMENT Left 10/15/2016   Procedure: ARTERIOVENOUS (AV) FISTULA CREATION-LEFT ARM;  Surgeon: Fransisco Hertz, MD;  Location: [facility] OR;  Service: Vascular;  Laterality: Left;   INSERTION OF DIALYSIS CATHETER Right 10/15/2016   Procedure: INSERTION OF DIALYSIS CATHETER;  Surgeon: Fransisco Hertz, MD;  Location: [facility];  Service: Vascular;  Laterality: Right;   WISDOM TOOTH EXTRACTION     Social History   Occupational History   Not on file  Tobacco Use   Smoking status: Never   Smokeless tobacco: Never  Substance and Sexual Activity   Alcohol use: No   Drug use: No    Comment: formerly used Marijuana   Sexual activity: Not on file

## 2022-05-06 ENCOUNTER — Encounter: Payer: Self-pay | Admitting: Physician Assistant

## 2022-05-06 ENCOUNTER — Ambulatory Visit (INDEPENDENT_AMBULATORY_CARE_PROVIDER_SITE_OTHER): Payer: 59 | Admitting: Physician Assistant

## 2022-05-06 DIAGNOSIS — M545 Low back pain, unspecified: Secondary | ICD-10-CM | POA: Diagnosis not present

## 2022-05-06 NOTE — Progress Notes (Signed)
Office Visit Note   Patient: Barry Nichols           Date of Birth: Dec 27, 1975           MRN: [id number] Visit Date: 05/06/2022              Requested by: Fortino Sic, PA No address on file PCP: Fortino Sic, PA  Chief Complaint  Patient presents with   Left Hip - Pain      HPI: Mr.  Bart comes in today for follow-up on his left hip.  I saw him about a week ago and he was complaining of posterior lateral hip pain.  It had been going on for a month or 2 but got worse in a couple weeks.  He denies any fever or chills he denies any radicular symptoms.  He most notices it when he lifts up his leg on a step and weightbears with that.  He describes going up stairs to be difficult.  I last saw him a week ago and I prescribed a Depo-Medrol Dosepak.  Unfortunately he cannot take anti-inflammatories because he is a kidney transplant patient.  I offered physical therapy and he has a family member and was doing exercises to family member prescribed as they are physical therapist.  He comes in today because the pain has not gotten better and if anything a little bit worse.  He completed the Medrol Dosepak yesterday and does not think it helped him at all.  Again denies any fever fever chills denies any radicular symptoms focuses over the posterior lateral buttock.  Denies any loss of bowel or bladder control.  He does describe that his leg will give way but this is always when he does something or moves the leg up and bears weight and he has significant pain.  Assessment & Plan: Visit Diagnoses:  1. Lumbar pain     Plan: I did review his lumbar x-rays with Dr. Ernestina Patches today does show a little bit of degenerative changes at the L5-S1 level.  His exam has always been more consistent with a piriformis syndrome.  Certainly it could be either of these and I explained this to the patient.  He has great strength today and is neurovascular neurologically intact.  He has no  strength deficits.  The leg giving way could very well just be from the pain he has as he does not have it any other time.  I will order a stat MRI just to rule out a lumbar cause giving his concerns and increasing pain.  Unusual that he did get any relief from the steroid.  Will also have him see Dr. Rolena Infante next week and have an ultrasound evaluation of his piriformis.  Could also consider based on the MRI and the exam next week formal physical therapy.  I told the patient if he has an escalation of his symptoms of course he can see me sooner or to go to the nearest urgent care or emergency room.  He does have some hydrocodone at home that I told him he could try taking in the evening since he cannot take anti-inflammatories.  He also has a muscle relaxant but does not think this is been very helpful  Follow-Up Instructions: Return in about 1 week (around 05/13/2022), or Tuesday with Dr. Rolena Infante for ultrasound evaluation.   Ortho Exam  Patient is alert, oriented, no adenopathy, well-dressed, normal affect, normal respiratory effort. Patient appears well nontoxic.  No pain with  manipulation of his hip.  No pain when he lays directly on his side over the trochanteric bursa.  When he lifts his leg and places weight on it he gets relief reproducible pain in the posterior lateral buttock.  This does not radiate.  He is neurologically intact his strength is 5 out of 5 bilaterally with resisted dorsiflexion plantarflexion extension and flexion of his legs.  He has a negative straight leg raise.  He is nontender to percussion over the spine  Imaging: No results found. No images are attached to the encounter.  Labs: Lab Results  Component Value Date   HGBA1C 5.5 08/25/2016     Lab Results  Component Value Date   ALBUMIN 2.8 (L) 08/27/2016   ALBUMIN 2.9 (L) 08/26/2016   ALBUMIN 3.2 (L) 08/25/2016    Lab Results  Component Value Date   MG 1.7 08/26/2016   MG 2.1 09/11/2011   No results found for:  "VD25OH"  No results found for: "PREALBUMIN"    Latest Ref Rng & Units 10/15/2016    9:19 AM 08/27/2016    3:57 AM 08/26/2016    3:15 AM  CBC EXTENDED  WBC 4.0 - 10.5 K/uL  4.1  5.9   RBC 4.22 - 5.81 MIL/uL  2.76  2.69   Hemoglobin 13.0 - 17.0 g/dL 7.5  7.8  7.7   HCT 83.1 - 52.0 % 22.0  24.2  23.4   Platelets 150 - 400 K/uL  246  238      There is no height or weight on file to calculate BMI.  Orders:  Orders Placed This Encounter  Procedures   MR Lumbar Spine w/o contrast   No orders of the defined types were placed in this encounter.    Procedures: No procedures performed  Clinical Data: No additional findings.  ROS:  All other systems negative, except as noted in the HPI. Review of Systems  Objective: Vital Signs: There were no vitals taken for this visit.  Specialty Comments:  No specialty comments available.  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, Bilateral   Migraine    "when I was young" (08/25/2016"   Posterior equinus, acquired    Bilateral   Pre-diabetes     Family History  Problem Relation Age of Onset   Hypertension Father        Also maternal grandmother   Diabetes Father        also maternal grandmother   CVA Father    Cancer Maternal Grandfather        lung   Heart attack Maternal Grandfather     Past Surgical History:  Procedure Laterality  Date   AV FISTULA PLACEMENT Left 10/15/2016   Procedure: ARTERIOVENOUS (AV) FISTULA CREATION-LEFT ARM;  Surgeon: Fransisco Hertz, MD;  Location: [facility] OR;  Service: Vascular;  Laterality: Left;   INSERTION OF DIALYSIS CATHETER Right 10/15/2016   Procedure: INSERTION OF DIALYSIS CATHETER;  Surgeon: Conrad Cromwell, MD;  Location: [facility] OR;  Service: Vascular;  Laterality: Right;   WISDOM TOOTH EXTRACTION     Social History   Occupational History   Not on file  Tobacco Use   Smoking status: Never   Smokeless tobacco: Never  Substance and Sexual Activity   Alcohol use: No   Drug use: No    Comment: formerly used Marijuana   Sexual activity: Not on file

## 2022-05-12 ENCOUNTER — Ambulatory Visit: Payer: Self-pay

## 2022-05-12 ENCOUNTER — Ambulatory Visit (INDEPENDENT_AMBULATORY_CARE_PROVIDER_SITE_OTHER): Payer: Medicare Other | Admitting: Sports Medicine

## 2022-05-12 ENCOUNTER — Encounter: Payer: Self-pay | Admitting: Sports Medicine

## 2022-05-12 DIAGNOSIS — M25552 Pain in left hip: Secondary | ICD-10-CM | POA: Diagnosis not present

## 2022-05-12 DIAGNOSIS — M76892 Other specified enthesopathies of left lower limb, excluding foot: Secondary | ICD-10-CM | POA: Diagnosis not present

## 2022-05-12 NOTE — Progress Notes (Signed)
Barry Nichols - 47 y.o. male MRN [id number]  Date of birth: 1975-10-08  Office Visit Note: Visit Date: 05/12/2022 PCP: Fortino Sic, PA Referred by: Fortino Sic, *  Subjective: No chief complaint on file.  HPI: Barry Nichols is a pleasant 47 y.o. male who presents today for left hip/hip flexor pain.   Saw my partner, Bevely Palmer Nichols, who evaluated him for the hip a few weeks ago and the patient was having more posterior lateral hip pain.  However he states over the past few days he noticed the pain is really been localized to the anterior hip.  He gets more pain with stepping or flexing the hip.  He has had pain in the buttock as well.  Recently took a Depo-Medrol Dosepak and does not think this helped improve his pain.  He is leaving for a trip to [place] next week and would like to be feeling better by then.  He does have a history of kidney transplant - NSAIDs should be avoided in his case.  Pertinent ROS were reviewed with the patient and found to be negative unless otherwise specified above in HPI.   Assessment & Plan: Visit Diagnoses:  1. Pain in left hip   2. Hip flexor tendinitis, left    Plan: Discussed with Ollen Gross the nature of his left hip pain. It is slightly unusual his pain has migrated around the hip from his prior visits, but his exam today and US shows proximal hip flexor (rectus femoris) tendinopathy. When palpating this region this is where his pain also localizes and pain is reproduced with hip flexion. We discussed all treatment options such as physical therapy, HEP, oral medications, injection therapy, more imaging. Through shared decision-making, we proceeded with insertional RF US-guided injection, patient tolerated well. Activity modification x 48 hours. We did print out and review hip flexors and rehab exercises for the hip for him to begin at home as he has a close friend who knows physical therapy. Will perform once daily. May  use ice or his hydrocodone for any post-injection pain. He will follow-up in 1 month if not improved. Next steps would be getting hip x-rays and consider advanced imaging such as MRI.  Follow-up: Return in about 1 month (around 06/12/2022) for if pain not improved with [name] PA or myself .   Meds & Orders: No orders of the defined types were placed in this encounter.   Orders Placed This Encounter  Procedures   [place] Extrem Low Left Ltd     Procedures:  Procedure: Ultrasound-guided Rectus femoris tendon Injection, left hip After discussion on risk/benefits/indication, informed verbal consent was obtained. A timeout was then performed. The patient was lying supine on examination table with affected leg extended on table. The patient's anterior hip was prepped with Chloraprep and alcohol swabs. Utilizing ultrasound-guidance with the probe in an anatomic sagittal plane, the patient's AIIS and proximal rectus femoris tendon was identified and the insertional area was subsequently injected with a mixture of 1:1:1 lidocaine:bupivicaine:betamethasone utilizing an in-plane visualization approach. Patient tolerated the procedure well without immediate complications.      Clinical History: No specialty comments available.  He reports that he has never smoked. He has never used smokeless tobacco. No results for input(s): "HGBA1C", "LABURIC" in the last 8760 hours.  Objective:    Physical Exam  Gen: Well-appearing, in no acute distress; non-toxic CV: Well-perfused. Warm.  Resp: Breathing unlabored on room air; no wheezing. Psych: Fluid  speech in conversation; appropriate affect; normal thought process Neuro: Sensation intact throughout. No gross coordination deficits.   Ortho Exam - Left hip: Inspection of the hip demonstrates no swelling, erythema, or ecchymosis. + TTP over the anterior proximal hip flexors just distal to the AIIS. No ASIS or GT TTP. Palpation throughout the piriformis  muscle belly does not reproduce pain. No mechanical blocks to passive log roll internally/externally. Negative FADIR. + Pain with resisted hip flexion, + Stinchfield testing. Weakness 2/2 to pain with hip flexion, otherwise 5/5 strength throughout.   Imaging: [place] Extrem Low Left Ltd  Result Date: 05/12/2022 Limited musculoskeletal ultrasound of the anterior hip was performed today.  Ultrasound shows good curvature of the femoral head and neck junction without significant spurring.  There is no significant joint effusion.  There is some scatter changes of the limited view of the anterior labrum.  Evaluation of the ASIS shows no cortical regularity nor tendon defect coming from the sartorius.  Most notably near the AIIS there is some tendinopathy changes near the insertion of the rectus femoris with some hypoechoic fluid.  No significant hyperemia in this region.     Past Medical/Family/Surgical/Social History: Medications & Allergies reviewed per EMR, new medications updated. 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, Bilateral   Migraine    "when I was young" (08/25/2016"   Posterior equinus, acquired    Bilateral   Pre-diabetes    Family History  Problem Relation Age of Onset   Hypertension Father        Also maternal grandmother   Diabetes Father        also maternal grandmother   CVA Father    Cancer Maternal  Grandfather        lung   Heart attack Maternal Grandfather    Past Surgical History:  Procedure Laterality Date   AV FISTULA PLACEMENT Left 10/15/2016   Procedure: ARTERIOVENOUS (AV) FISTULA CREATION-LEFT ARM;  Surgeon: Conrad Hornitos, MD;  Location: [facility] OR;  Service: Vascular;  Laterality: Left;   INSERTION OF DIALYSIS CATHETER Right 10/15/2016   Procedure: INSERTION OF DIALYSIS CATHETER;  Surgeon: Conrad [place], MD;  Location: [facility] OR;  Service: Vascular;  Laterality: Right;   WISDOM TOOTH EXTRACTION     Social History   Occupational History   Not on file  Tobacco Use   Smoking status: Never   Smokeless tobacco: Never  Substance and Sexual Activity   Alcohol use: No   Drug use: No    Comment: formerly used Marijuana   Sexual activity: Not on file

## 2022-05-27 ENCOUNTER — Ambulatory Visit (INDEPENDENT_AMBULATORY_CARE_PROVIDER_SITE_OTHER): Payer: 59 | Admitting: Sports Medicine

## 2022-05-27 ENCOUNTER — Encounter: Payer: Self-pay | Admitting: Sports Medicine

## 2022-05-27 ENCOUNTER — Ambulatory Visit (INDEPENDENT_AMBULATORY_CARE_PROVIDER_SITE_OTHER): Payer: Medicare Other

## 2022-05-27 DIAGNOSIS — M25552 Pain in left hip: Secondary | ICD-10-CM

## 2022-05-27 NOTE — Progress Notes (Signed)
Still in a lot of pain States injection helped 1 day He thinks it is coming from his hip still; pain with step up activities  Is not doing any PT  Patient was instructed in 10 minutes of therapeutic exercises for left hip pain to improve strength, ROM and function according to my instructions and plan of care by a Certified Athletic Trainer during the office visit. A customized handout was provided and demonstration of proper technique shown and discussed. Patient did perform exercises and demonstrate understanding through teachback.  All questions discussed and answered.

## 2022-05-27 NOTE — Progress Notes (Signed)
Barry Nichols - 47 y.o. male MRN [id number]  Date of birth: Apr 05, 1976  Office Visit Note: Visit Date: 05/27/2022 PCP: Fortino Sic, PA Referred by: Fortino Sic, *  Subjective: Chief Complaint  Patient presents with   Left Hip - Pain   HPI: Barry Nichols is a pleasant 47 y.o. male who presents today for follow-up of left hip pain.  He continues with anterior lateral hip pain.  He only got about 1 day of relief from the ultrasound-guided rectus femoris tendon injection.  He does have pain with flexing of the hip and with lying on that side.  His pain feels deep within the hip, as he cannot touch it.  Denies any pain coming from the posterior aspect anymore more so over the anterior and lateral side.  He cannot take any anti-inflammatories given his kidney status with prior transplant.  Has been doing some quadricep exercises we prescribed at last visit.  Pertinent ROS were reviewed with the patient and found to be negative unless otherwise specified above in HPI.   Assessment & Plan: Visit Diagnoses:  1. Pain in left hip    Plan: I discussed with Vineet today the exact etiology of his hip pain is still uncertain, although I am very suspicious that it is coming from within the hip or the surrounding hip musculature.  X-rays show some mild arthritic change, although certainly not advanced arthritis.  He still has provocative pain with resisted hip flexion and positive C sign.  Discussed all treatment options such as oral medication therapy, home rehab versus formalized physical therapy, injection therapy or further evaluation with advanced imaging.  He will continue with home rehab, I did print out a customized handout for hip stabilization and strengthening, my athletic trainer, Lilia Pro, did review these with him in the room today. He will perform these at least once, up to twice daily.  I would like to see him back in 1 month.  If he is not making progress, we can  consider possibly diagnostic and therapeutic ultrasound-guided intra-articular hip injection versus obtaining an MRI/MRA of the hip to evaluate the labrum/surrounding hip musculature for more information.   Follow-up: Return in about 4 weeks (around 06/24/2022).   Meds & Orders: No orders of the defined types were placed in this encounter.   Orders Placed This Encounter  Procedures   XR HIP UNILAT W OR W/O PELVIS 2-3 VIEWS LEFT     Procedures: No procedures performed      Clinical History: No specialty comments available.  He reports that he has never smoked. He has never used smokeless tobacco. No results for input(s): "HGBA1C", "LABURIC" in the last 8760 hours.  Objective:    Physical Exam  Gen: Well-appearing, in no acute distress; non-toxic CV: Well-perfused. Warm.  Resp: Breathing unlabored on room air; no wheezing. Psych: Fluid speech in conversation; appropriate affect; normal thought process Neuro: Sensation intact throughout. No gross coordination deficits.   Ortho Exam - Left hip: Inspection of the hip demonstrates no overlying swelling or effusion.  There is no specific bony TTP over the ASIS or the greater trochanter.  Positive C sign on exam.  There are no mechanical blocks to passive logroll internally or externally.  Mild pain with FADIR.  Positive Stinchfield, positive FABER testing.  There is pain and weakness secondary.  Resisted hip flexion, otherwise 5/5 strength throughout.  No weakness with resisted hip abduction.  Negative SLR. No pain with lumbar flexion or extension.  Imaging: XR HIP UNILAT W OR W/O PELVIS 2-3 VIEWS LEFT  Result Date: 05/27/2022 2 view x-ray of the left hip including AP and lateral femoral ordered and reviewed by myself.  X-rays demonstrate mild arthritic change with a small inferior spur of the femoral head.  There is certainly no marked loss of joint space.  No acute fracture or other bony abnormality noted.   Past  Medical/Family/Surgical/Social History: Medications & Allergies reviewed per EMR, new medications updated. 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, Bilateral   Migraine    "when I was young" (08/25/2016"   Posterior equinus, acquired    Bilateral   Pre-diabetes    Family History  Problem Relation Age of Onset   Hypertension Father        Also maternal grandmother   Diabetes Father        also maternal grandmother   CVA Father    Cancer Maternal Grandfather        lung   Heart attack Maternal Grandfather    Past Surgical History:  Procedure Laterality Date   AV FISTULA PLACEMENT Left 10/15/2016   Procedure: ARTERIOVENOUS (AV) FISTULA CREATION-LEFT ARM;  Surgeon: Conrad Cassadaga, MD;  Location: [facility] OR;  Service: Vascular;  Laterality: Left;   INSERTION OF DIALYSIS CATHETER Right 10/15/2016   Procedure: INSERTION OF DIALYSIS CATHETER;  Surgeon: Conrad [place], MD;  Location: [facility] OR;  Service: Vascular;  Laterality: Right;   WISDOM TOOTH EXTRACTION     Social History   Occupational History   Not on file  Tobacco Use   Smoking status: Never   Smokeless tobacco: Never  Substance and Sexual Activity   Alcohol use: No   Drug use: No    Comment: formerly used Marijuana   Sexual activity: Not on file

## 2022-05-29 ENCOUNTER — Ambulatory Visit: Payer: Medicare Other | Admitting: Physician Assistant

## 2022-08-19 ENCOUNTER — Telehealth: Payer: Self-pay | Admitting: Sports Medicine

## 2022-08-19 NOTE — Telephone Encounter (Signed)
Pt called requesting a call back from Dr Shon Baton. Pt is asking for MRI referral but unsure of what part needs MRI. Pt states discussed could be hip or quad. Please call pt at [phone].

## 2022-08-21 ENCOUNTER — Ambulatory Visit (INDEPENDENT_AMBULATORY_CARE_PROVIDER_SITE_OTHER): Payer: 59 | Admitting: Sports Medicine

## 2022-08-21 ENCOUNTER — Encounter: Payer: Self-pay | Admitting: Sports Medicine

## 2022-08-21 DIAGNOSIS — G8929 Other chronic pain: Secondary | ICD-10-CM

## 2022-08-21 DIAGNOSIS — M25552 Pain in left hip: Secondary | ICD-10-CM

## 2022-08-21 NOTE — Progress Notes (Signed)
Doing alright. States he was doing PT; not helping much Denies OTC medication

## 2022-08-21 NOTE — Progress Notes (Signed)
Barry Nichols - 47 y.o. male MRN [id number]  Date of birth: [birth year]  Office Visit Note: Visit Date: 08/21/2022 PCP: Clide Dales, PA Referred by: Clide Dales, *  Subjective: Chief Complaint  Patient presents with   Left Hip - Pain   HPI: Barry Nichols is a pleasant 47 y.o. male who presents today for follow-up of chronic left hip pain.   Barry Nichols continues with left hip pain.  He has been doing home exercises and has tried sessions of formalized physical therapy with a gave him some exercises and soft tissue techniques.  This has helped to some degree but he still having issues.  His pain comes and goes.  Pain is more so over the anterior aspect of the hip, worse with hip flexion.  Also gets some pain over the lateral side of the hip as well.  Pertinent ROS were reviewed with the patient and found to be negative unless otherwise specified above in HPI.   Assessment & Plan: Visit Diagnoses:  1. Chronic left hip pain    Plan: I discussed with Barry Nichols the possible etiology of his hip pain, at this time I am most suspicious for pain coming from the intra-articular from the hip with his positive C sign or the surrounding hip flexor musculature as hip flexion is his most provocative painful maneuver.  This has been bothering him for 6 months or more, and he has been formalized physical therapy with only mild relief.  At this point I think we need to obtain an MRA of the hip to evaluate structures internally as well as surrounding musculature to pinpoint down the diagnosis for his therapy and treatment plan going forward.  He may continue Tylenol as needed as well as his PT and home hip exercises in the meantime. Will f/u 3 business days after MRI scan to review.  Follow-up: Return for f/u in 3 business days after MRI to review.   Meds & Orders: No orders of the defined types were placed in this encounter.   Orders Placed This Encounter  Procedures   MR Hip Left  w/ contrast   Arthrogram     Procedures: No procedures performed      Clinical History: No specialty comments available.  He reports that he has never smoked. He has never used smokeless tobacco. No results for input(s): "HGBA1C", "LABURIC" in the last 8760 hours.  Objective:   Vital Signs: There were no vitals taken for this visit.  Physical Exam  Gen: Well-appearing, in no acute distress; non-toxic CV: Well-perfused. Warm.  Resp: Breathing unlabored on room air; no wheezing. Psych: Fluid speech in conversation; appropriate affect; normal thought process Neuro: Sensation intact throughout. No gross coordination deficits.   Ortho Exam - Left hip: There is no redness or overlying swelling.  There is some mild TTP of the greater trochanter and just medial to this overlying the TFL and common flexor origin.  There is some mild pain with endrange flexion of the hip.  Positive C sign.  There are no blocks to passive logroll internally or externally.  Positive Stinchfield, positive FABER testing.  There is 5/5 strength of the hips in all directions.  Imaging: No results found.  Past Medical/Family/Surgical/Social History: Medications & Allergies reviewed per EMR, new medications updated. 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, Bilateral   Migraine    "when I was young" (08/25/2016"   Posterior equinus,  acquired    Bilateral   Pre-diabetes    Family History  Problem Relation Age of Onset   Hypertension Father        Also maternal grandmother   Diabetes Father        also maternal grandmother   CVA Father    Cancer Maternal Grandfather        lung   Heart attack Maternal Grandfather    Past Surgical History:  Procedure Laterality Date   AV FISTULA PLACEMENT Left 10/15/2016   Procedure: ARTERIOVENOUS (AV) FISTULA CREATION-LEFT ARM;  Surgeon: Fransisco Hertz, MD;  Location: [facility] OR;  Service: Vascular;  Laterality: Left;   INSERTION OF DIALYSIS CATHETER Right 10/15/2016   Procedure: INSERTION OF DIALYSIS CATHETER;  Surgeon: Fransisco Hertz, MD;  Location: [facility] OR;  Service: Vascular;  Laterality: Right;   WISDOM TOOTH EXTRACTION     Social History   Occupational History   Not on file  Tobacco Use   Smoking status: Never   Smokeless tobacco: Never  Substance and Sexual Activity   Alcohol use: No   Drug use: No    Comment: formerly used Marijuana   Sexual activity: Not on file

## 2022-08-31 ENCOUNTER — Encounter: Payer: Self-pay | Admitting: Sports Medicine

## 2022-09-07 ENCOUNTER — Ambulatory Visit
Admission: RE | Admit: 2022-09-07 | Discharge: 2022-09-07 | Disposition: A | Discharge status: Home / Self Care | Payer: Medicare Other | Source: Ambulatory Visit | Attending: Sports Medicine | Admitting: Sports Medicine

## 2022-09-07 DIAGNOSIS — G8929 Other chronic pain: Secondary | ICD-10-CM

## 2022-09-07 MED ORDER — IOPAMIDOL (ISOVUE-M 200) INJECTION 41%
12.0000 mL | Freq: Once | INTRAMUSCULAR | Status: AC
  Administered 2022-09-07: 12 mL via INTRA_ARTICULAR

## 2022-09-10 ENCOUNTER — Ambulatory Visit (INDEPENDENT_AMBULATORY_CARE_PROVIDER_SITE_OTHER): Payer: 59 | Admitting: Sports Medicine

## 2022-09-10 DIAGNOSIS — M87052 Idiopathic aseptic necrosis of left femur: Secondary | ICD-10-CM

## 2022-09-10 DIAGNOSIS — M25552 Pain in left hip: Secondary | ICD-10-CM

## 2022-09-10 DIAGNOSIS — N402 Nodular prostate without lower urinary tract symptoms: Secondary | ICD-10-CM | POA: Diagnosis not present

## 2022-09-10 DIAGNOSIS — Z940 Kidney transplant status: Secondary | ICD-10-CM

## 2022-09-10 DIAGNOSIS — G8929 Other chronic pain: Secondary | ICD-10-CM

## 2022-09-10 NOTE — Progress Notes (Signed)
Barry Nichols - 47 y.o. male MRN [id number]  Date of birth: October 03, 1975  Office Visit Note: Visit Date: 09/10/2022 PCP: Clide Dales, PA Referred by: Clide Dales, *  Subjective: Chief Complaint  Patient presents with   Left Hip - Pain   HPI: Barry Nichols is a pleasant 47 y.o. male who presents today for follow-up of chronic left hip pain, here for MRI review.  Barry Nichols is still experiencing chronic left hip pain.  In general his pain is slightly improved from when I saw him back a few months ago but still having pain and limitation in activities.  At times feels like the hip will give out on him.  Had a recent MRI which upon my review shows avascular necrosis of the femoral head without collapse of the joint.   He is a kidney transplant recipient around March or 2022.  He was on prolonged taper corticosteroids and then has been on consistent prednisone 5 mg since that time.  He has very infrequent alcohol use, only 1-2 drinks per month. He has not had any chemo or radiation.  Does not smoke tobacco.  Pertinent ROS were reviewed with the patient and found to be negative unless otherwise specified above in HPI.   Assessment & Plan: Visit Diagnoses:  1. Avascular necrosis of bone of left hip (HCC)   2. Kidney transplant recipient   3. Prostate nodule    Plan: Reviewed the MRI with [name] today and discussed the etiology of his left hip pain which shows avascular necrosis of the femoral head, fortunately I cannot appreciate any evidence of collapse of the joint space but there is rather significant marrow edema.  He did have a prior kidney transplant and was on prolonged taper course of prednisone that initially was higher dose, and currently now on 5 mg daily -likely given source of his AVN versus idiopathic.  His hip pain is certainly not worsening over the last few months but still causing some limitation and at times giving out. Discussed surgical options such  as rest, physical therapy, offloading the joint.  Did have a discussion on having him see one of my partners, Dr. Steward Drone to discuss surgical evaluation with core decompression.  If surgery is not warranted, I am happy to continue to see him for nonoperative management.  His MRI did show a small nodule with T2 hypointensity in the periphery of his prostate.  He does follow with a urologist, discussed the importance of discussing this with his urologist to correlate with PSA levels and determine further action if needed.  Follow-up: Return for follow-up with Dr. Steward Drone for AVN of left hip.   Meds & Orders: No orders of the defined types were placed in this encounter.  No orders of the defined types were placed in this encounter.    Procedures: No procedures performed      Clinical History: No specialty comments available.  He reports that he has never smoked. He has never used smokeless tobacco. No results for input(s): "HGBA1C", "LABURIC" in the last 8760 hours.  Objective:   Vital Signs: There were no vitals taken for this visit.  Physical Exam  Gen: Well-appearing, in no acute distress; non-toxic CV:  Well-perfused. Warm.  Resp: Breathing unlabored on room air; no wheezing. Psych: Fluid speech in conversation; appropriate affect; normal thought process Neuro: Sensation intact throughout. No gross coordination deficits.   Ortho Exam - Left hip: No redness or swelling.  There is rather fluid  range of motion with internal and external logroll although some pain with endrange internal rotation although certainly not significantly limited compared to his contralateral hip.  Positive FADIR test.  Strength is well-preserved.  Imaging: MR Hip Left w/ contrast CLINICAL DATA:  Left hip pain  EXAM: MRI OF THE LEFT HIP WITH CONTRAST (MR Arthrogram)  TECHNIQUE: Multiplanar, multisequence MR imaging of the hip was performed immediately following contrast injection into the hip joint  under fluoroscopic guidance. No intravenous contrast was administered.  COMPARISON:  None Available.  FINDINGS: Bones: Avascular necrosis of the left femoral head involving greater than 50%. No evidence of articular surface collapse. Associated marrow edema. The visualized sacroiliac joints and symphysis pubis appear normal.  Articular cartilage and labrum  Articular cartilage: Mild to moderate partial-thickness cartilage loss.  Labrum: Degenerative anterior superior labral fraying.  Joint or bursal effusion  Joint effusion: Adequate joint distension with intra-articular contrast.  Bursae: No evidence of trochanteric bursitis.  Muscles and tendons  Muscles and tendons: The gluteal tendons are intact. The proximal hamstrings are intact.The adductors are intact. No muscle atrophy of edema.  Other findings  Miscellaneous: Nodular T2 hypointensity in the right peripheral zone of the prostate gland measuring 1.0 cm (series 11, image 21).  IMPRESSION: Avascular necrosis of the left femoral head. No articular surface collapse.  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  No acute myotendinous abnormality.  Nodular T2 hypointensity in the right peripheral zone of the prostate gland measuring 1.0 cm, recommend correlation with PSA.  Electronically Signed   By: Caprice Renshaw M.D.   On: 09/09/2022 12:59    Past Medical/Family/Surgical/Social History: Medications & Allergies reviewed per EMR, new medications updated. 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, Bilateral   Migraine    "when I was young" (08/25/2016"   Posterior equinus, acquired    Bilateral   Pre-diabetes    Family History  Problem Relation Age of Onset   Hypertension Father        Also maternal grandmother   Diabetes Father        also maternal grandmother   CVA Father    Cancer Maternal Grandfather        lung   Heart attack Maternal Grandfather    Past Surgical History:  Procedure Laterality Date   AV FISTULA PLACEMENT Left 10/15/2016   Procedure: ARTERIOVENOUS (AV) FISTULA CREATION-LEFT ARM;  Surgeon: Fransisco Hertz, MD;  Location: [facility] OR;  Service: Vascular;  Laterality: Left;   INSERTION OF DIALYSIS CATHETER Right 10/15/2016   Procedure: INSERTION OF DIALYSIS CATHETER;  Surgeon: Fransisco Hertz, MD;  Location: [facility] OR;  Service: Vascular;  Laterality: Right;   WISDOM TOOTH EXTRACTION     Social History   Occupational History   Not on  file  Tobacco Use   Smoking status: Never   Smokeless tobacco: Never  Substance and Sexual Activity   Alcohol use: No   Drug use: No    Comment: formerly used Marijuana   Sexual activity: Not on file   I spent 33 minutes in the care of the patient today including face-to-face time, preparation to see the patient, as well as review interpretation of MRI, discussion on HEP, holistic, and conservative treatment measures; discussion with patient on possible surgical treatment to be performed by one of my orthopedic surgeons, communication with patient and his urologist about prostate gland nodule for the above diagnoses.   Madelyn Brunner, DO Primary Care Sports Medicine Physician  [facility] - Orthopedics  This note was dictated using Dragon naturally speaking software and may contain errors in syntax,  spelling, or content which have not been identified prior to signing this note.

## 2022-09-16 ENCOUNTER — Ambulatory Visit (INDEPENDENT_AMBULATORY_CARE_PROVIDER_SITE_OTHER): Payer: 59 | Admitting: Orthopaedic Surgery

## 2022-09-16 DIAGNOSIS — S76012A Strain of muscle, fascia and tendon of left hip, initial encounter: Secondary | ICD-10-CM | POA: Diagnosis not present

## 2022-09-16 DIAGNOSIS — M25552 Pain in left hip: Secondary | ICD-10-CM | POA: Diagnosis not present

## 2022-09-16 MED ORDER — LIDOCAINE HCL 1 % IJ SOLN
4.0000 mL | INTRAMUSCULAR | Status: AC | PRN
Start: 2022-09-16 — End: 2022-09-16
  Administered 2022-09-16: 4 mL

## 2022-09-16 MED ORDER — TRIAMCINOLONE ACETONIDE 40 MG/ML IJ SUSP
80.0000 mg | INTRAMUSCULAR | Status: AC | PRN
Start: 2022-09-16 — End: 2022-09-16
  Administered 2022-09-16: 80 mg via INTRA_ARTICULAR

## 2022-09-16 NOTE — Progress Notes (Signed)
Chief Complaint: Left hip pain     History of Present Illness:    Barry Nichols is a 47 y.o. male presents today with predominantly lateral based hip pain.  He has been living with this for several months and this has become very limiting in most activities.  He has pain with lying directly on the side.  He does have weakness with going up and down stairs.  He does have a history of being on steroids longer-term status post kidney transplant.  He did have an MRI with Dr. 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  He is here today for further discussion.    Surgical History:   none  PMH/PSH/Family History/Social History/Meds/Allergies:    Past Medical History:  Diagnosis Date  . Anemia    low iron  . CHF (congestive heart failure) (HCC)   . CKD (chronic kidney disease), stage IV (HCC)   . Hallux limitus    Bilateral  . History of gout ~ 2013/2014  . Hypertension    Negative duplex 2012 for RAS  . Hypertensive CKD (chronic kidney disease)   . 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 Migraine    "when I was young" (08/25/2016"  . Posterior equinus, acquired    Bilateral  . Pre-diabetes    Past Surgical History:  Procedure Laterality Date  . AV FISTULA PLACEMENT Left 10/15/2016   Procedure: ARTERIOVENOUS (AV) FISTULA CREATION-LEFT ARM;  Surgeon: Fransisco Hertz, MD;  Location: [facility] OR;  Service: Vascular;  Laterality: Left;  . INSERTION OF DIALYSIS CATHETER Right 10/15/2016   Procedure: INSERTION OF DIALYSIS CATHETER;  Surgeon: Fransisco Hertz, MD;  Location: [facility] OR;  Service: Vascular;  Laterality: Right;  . WISDOM TOOTH EXTRACTION     Social History   Socioeconomic History  . Marital status: Married    Spouse name: Not on file  . Number of children: 1  . Years of education: BA  . Highest education level: Not on file  Occupational History  . Not on file  Tobacco Use  . Smoking status: Never  .  Smokeless tobacco: Never  Substance and Sexual Activity  . Alcohol use: No  . Drug use: No    Comment: formerly used Marijuana  . Sexual activity: Not on file  Other Topics Concern  . Not on file  Social History Narrative   Drinks tea 3-5 times a week    Social Determinants of Health   Financial Resource Strain: Not on file  Food Insecurity: Not on file  Transportation Needs: Not on file  Physical Activity: Not on file  Stress: Not on file  Social Connections: Not on file   Family History  Problem Relation Age of Onset  . Hypertension Father        Also maternal grandmother  . Diabetes Father        also maternal grandmother  . CVA Father   . Cancer Maternal Grandfather        lung  . Heart attack Maternal Grandfather    Allergies  Allergen Reactions  . No Known Allergies    Current Outpatient Medications  Medication Sig Dispense Refill  . amLODipine (NORVASC) 10 MG tablet Take 1 tablet (10 mg total) by mouth at bedtime. 30 tablet 0  .  calcitRIOL (ROCALTROL) 0.25 MCG capsule Take 0.5 mcg by mouth daily.     . carvedilol (COREG) 25 MG tablet Take 1 tablet (25 mg total) by mouth 2 (two) times daily with a meal. 60 tablet 0  . cloNIDine (CATAPRES) 0.3 MG tablet Take 1 tablet (0.3 mg total) by mouth 3 (three) times daily. 90 tablet 0  . methocarbamol (ROBAXIN) 500 MG tablet Take 1 tablet (500 mg total) by mouth every 6 (six) hours as needed for muscle spasms. 30 tablet 0  . methylPREDNISolone (MEDROL DOSEPAK) 4 MG TBPK tablet Take as directed 21 tablet 0  . minoxidil (LONITEN) 10 MG tablet Take 10 mg by mouth 2 (two) times daily.      No current facility-administered medications for this visit.   No results found.  Review of Systems:   A ROS was performed including pertinent positives and negatives as documented in the HPI.  Physical Exam :   Constitutional: NAD and appears stated age Neurological: Alert and oriented Psych: Appropriate affect and cooperative There  were no vitals taken for this visit.   Comprehensive Musculoskeletal Exam:    Inspection Right Left  Skin No atrophy or gross abnormalities appreciated No atrophy or gross abnormalities appreciated  Palpation    Tenderness None Greater trochanter.    Crepitus None None  Range of Motion    Flexion (passive) 120 120  Extension 30 30  IR 30 30  ER 45 45  Strength    Flexion  5/5 5/5  Extension 5/5 5/5  Special Tests    FABER Negative Negative  FADIR Negative Negative  ER Lag/Capsular Insufficiency Negative Negative  Instability Negative Negative  Sacroiliac pain Negative  Negative   Instability    Generalized Laxity No No  Neurologic    sciatic, femoral, obturator nerves intact to light sensation  Vascular/Lymphatic    DP pulse 2+ 2+  Lumbar Exam    Patient has symmetric lumbar range of motion with negative pain referral to hip  Positive Trendelenburg Imaging:   Xray (3 views left hip): Normal  MRI (left hip): There is a focus of avascular necrosis involving the central portion of the femoral head up to the weightbearing surface without collapse.  There is an intrasubstance tear at the junction of the gluteus medius and minimus  I personally reviewed and interpreted the radiographs.   Assessment:   47 y.o. male presents with hip symptoms in my opinion more consistent with gluteus medius tearing as his pain is predominantly lateral trochanteric based and he does have weakness on the side.  He has undergone physical therapy at this time.  I do believe he would benefit from some additional gluteal strengthening exercises but in the meantime I would like to begin with a diagnostic ultrasound-guided injection of the gluteal area so that we can ascertain if this is his true etiology of pain.  I did describe that his pain appears to be less like avascular necrosis pain which is more diffuse and centered in the groin.  He really had no pain with the FADIR test at today's visit which  is pointing more towards gluteus medius tendinopathy.  Will plan to work this up.  I will see him back in 4 weeks.  Plan :    -Left hip gluteus medius injection for left foot with some pain  Procedure Note  Patient: [name]             Date of Birth: 1975/05/01  MRN: [id number]             Visit Date: 09/16/2022  Procedures: Visit Diagnoses: No diagnosis found.  Large Joint Inj: L greater trochanter on 09/16/2022 11:52 AM Indications: pain Details: 22 G 3.5 in needle, ultrasound-guided anterolateral approach  Arthrogram: No  Medications: 4 mL lidocaine 1 %; 80 mg triamcinolone acetonide 40 MG/ML Outcome: tolerated well, no immediate complications Procedure, treatment alternatives, risks and benefits explained, specific risks discussed. Consent was given by the patient. Immediately prior to procedure a time out was called to verify the correct patient, procedure, equipment, support staff and site/side marked as required. Patient was prepped and draped in the usual sterile fashion.          I personally saw and evaluated the patient, and participated in the management and treatment plan.  Huel Cote, MD Attending Physician, Orthopedic Surgery  This document was dictated using Dragon voice recognition software. A reasonable attempt at proof reading has been made to minimize errors.

## 2022-10-14 ENCOUNTER — Ambulatory Visit (INDEPENDENT_AMBULATORY_CARE_PROVIDER_SITE_OTHER): Payer: 59 | Admitting: Orthopaedic Surgery

## 2022-10-14 DIAGNOSIS — M25552 Pain in left hip: Secondary | ICD-10-CM | POA: Diagnosis not present

## 2022-10-14 DIAGNOSIS — M87052 Idiopathic aseptic necrosis of left femur: Secondary | ICD-10-CM | POA: Diagnosis not present

## 2022-10-14 MED ORDER — LIDOCAINE HCL 1 % IJ SOLN
4.0000 mL | INTRAMUSCULAR | Status: AC | PRN
Start: 2022-10-14 — End: 2022-10-14
  Administered 2022-10-14: 4 mL

## 2022-10-14 MED ORDER — TRIAMCINOLONE ACETONIDE 40 MG/ML IJ SUSP
80.0000 mg | INTRAMUSCULAR | Status: AC | PRN
Start: 2022-10-14 — End: 2022-10-14
  Administered 2022-10-14: 80 mg via INTRA_ARTICULAR

## 2022-10-14 NOTE — Progress Notes (Signed)
Chief Complaint: Left hip pain     History of Present Illness:   10/14/2022: Presents today for follow-up after his left hip gluteus medius injection as well as MRI review.  Barry Nichols is a 47 y.o. male presents today with predominantly lateral based hip pain.  He has been living with this for several months and this has become very limiting in most activities.  He has pain with lying directly on the side.  He does have weakness with going up and down stairs.  He does have a history of being on steroids longer-term status post kidney transplant.  He did have an MRI with Dr. 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  He is here today for further discussion.    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, Bilateral   Migraine    "when I was young" (08/25/2016"   Posterior equinus, acquired    Bilateral   Pre-diabetes    Past Surgical History:  Procedure Laterality Date   AV FISTULA PLACEMENT Left 10/15/2016   Procedure: ARTERIOVENOUS (AV) FISTULA CREATION-LEFT ARM;  Surgeon: Fransisco Hertz, MD;  Location: [facility] OR;  Service: Vascular;  Laterality: Left;   INSERTION OF DIALYSIS CATHETER Right 10/15/2016   Procedure: INSERTION OF DIALYSIS CATHETER;  Surgeon: Fransisco Hertz, MD;  Location: [facility] OR;  Service: Vascular;  Laterality: Right;   WISDOM TOOTH EXTRACTION     Social History   Socioeconomic History   Marital status: Married    Spouse name: Not on file   Number of children: 1   Years of education: BA   Highest education level: Not on file   Occupational History   Not on file  Tobacco Use   Smoking status: Never   Smokeless tobacco: Never  Substance and Sexual Activity   Alcohol use: No   Drug use: No    Comment: formerly used Marijuana   Sexual activity: Not on file  Other Topics Concern   Not on file  Social History Narrative   Drinks tea 3-5 times a week    Social Determinants of Health   Financial Resource Strain: Not on file  Food Insecurity: Not on file  Transportation Needs: Not on file  Physical Activity: Not on file  Stress: Not on file  Social Connections: Not on file   Family History  Problem Relation Age of Onset   Hypertension Father        Also maternal grandmother   Diabetes Father        also maternal grandmother   CVA Father    Cancer Maternal Grandfather        lung   Heart attack Maternal Grandfather    Allergies  Allergen Reactions   No Known Allergies    Current Outpatient Medications  Medication Sig Dispense Refill   amLODipine (NORVASC)  10 MG tablet Take 1 tablet (10 mg total) by mouth at bedtime. 30 tablet 0   calcitRIOL (ROCALTROL) 0.25 MCG capsule Take 0.5 mcg by mouth daily.      carvedilol (COREG) 25 MG tablet Take 1 tablet (25 mg total) by mouth 2 (two) times daily with a meal. 60 tablet 0   cloNIDine (CATAPRES) 0.3 MG tablet Take 1 tablet (0.3 mg total) by mouth 3 (three) times daily. 90 tablet 0   methocarbamol (ROBAXIN) 500 MG tablet Take 1 tablet (500 mg total) by mouth every 6 (six) hours as needed for muscle spasms. 30 tablet 0   methylPREDNISolone (MEDROL DOSEPAK) 4 MG TBPK tablet Take as directed 21 tablet 0   minoxidil (LONITEN) 10 MG tablet Take 10 mg by mouth 2 (two) times daily.      No current facility-administered medications for this visit.   No results found.  Review of Systems:   A ROS was performed including pertinent positives and negatives as documented in the HPI.  Physical Exam :   Constitutional: NAD and appears stated age Neurological: Alert  and oriented Psych: Appropriate affect and cooperative There were no vitals taken for this visit.   Comprehensive Musculoskeletal Exam:    Inspection Right Left  Skin No atrophy or gross abnormalities appreciated No atrophy or gross abnormalities appreciated  Palpation    Tenderness None Greater trochanter.    Crepitus None None  Range of Motion    Flexion (passive) 120 120  Extension 30 30  IR 30 30  ER 45 45  Strength    Flexion  5/5 5/5  Extension 5/5 5/5  Special Tests    FABER Negative Negative  FADIR Negative Negative  ER Lag/Capsular Insufficiency Negative Negative  Instability Negative Negative  Sacroiliac pain Negative  Negative   Instability    Generalized Laxity No No  Neurologic    sciatic, femoral, obturator nerves intact to light sensation  Vascular/Lymphatic    DP pulse 2+ 2+  Lumbar Exam    Patient has symmetric lumbar range of motion with negative pain referral to hip  Positive Trendelenburg Imaging:   Xray (3 views left hip): Normal  MRI (left hip): There is a focus of avascular necrosis involving the central portion of the femoral head up to the weightbearing surface without collapse.  There is an intrasubstance tear at the junction of the gluteus medius and minimus  I personally reviewed and interpreted the radiographs.   Assessment:   47 y.o. male presents with left hip pain.  Unfortunately he did not get any relief from his gluteal injection whatsoever.  Given this I did discuss that at this time I would recommend an ultrasound-guided femoral acetabular lidocaine injection so that we can truly ascertain how much of his symptoms are coming directly from the hip.  At today's exam his symptoms do appear to be more consistent with avascular necrosis.  To that effect we will plan for an ultrasound-guided lidocaine injection and I will see him back in 1 week for reassessment  Plan :    -Left hip ultrasound-guided injection performed after verbal  consent obtained  Procedure Note  Patient: Barry Nichols             Date of Birth: November 17, 1975           MRN: [id number]             Visit Date: 10/14/2022  Procedures: Visit Diagnoses: No diagnosis found.  Large Joint Inj: L  hip joint on 10/14/2022 8:46 AM Indications: pain Details: 22 G 3.5 in needle, ultrasound-guided anterolateral approach  Arthrogram: No  Medications: 4 mL lidocaine 1 %; 80 mg triamcinolone acetonide 40 MG/ML Outcome: tolerated well, no immediate complications Procedure, treatment alternatives, risks and benefits explained, specific risks discussed. Consent was given by the patient. Immediately prior to procedure a time out was called to verify the correct patient, procedure, equipment, support staff and site/side marked as required. Patient was prepped and draped in the usual sterile fashion.          I personally saw and evaluated the patient, and participated in the management and treatment plan.  Huel Cote, MD Attending Physician, Orthopedic Surgery  This document was dictated using Dragon voice recognition software. A reasonable attempt at proof reading has been made to minimize errors.

## 2022-10-19 ENCOUNTER — Ambulatory Visit (INDEPENDENT_AMBULATORY_CARE_PROVIDER_SITE_OTHER): Payer: 59 | Admitting: Orthopaedic Surgery

## 2022-10-19 ENCOUNTER — Ambulatory Visit (HOSPITAL_BASED_OUTPATIENT_CLINIC_OR_DEPARTMENT_OTHER): Payer: Self-pay | Admitting: Orthopaedic Surgery

## 2022-10-19 ENCOUNTER — Other Ambulatory Visit (HOSPITAL_BASED_OUTPATIENT_CLINIC_OR_DEPARTMENT_OTHER): Payer: Self-pay

## 2022-10-19 DIAGNOSIS — M87052 Idiopathic aseptic necrosis of left femur: Secondary | ICD-10-CM

## 2022-10-19 MED ORDER — IBUPROFEN 800 MG PO TABS
800.0000 mg | ORAL_TABLET | Freq: Three times a day (TID) | ORAL | 0 refills | Status: AC
  Filled 2022-10-19: qty 30, 10d supply, fill #0

## 2022-10-19 MED ORDER — ASPIRIN 325 MG PO TBEC
325.0000 mg | DELAYED_RELEASE_TABLET | Freq: Every day | ORAL | 0 refills | Status: DC
  Filled 2022-10-19: qty 30, 30d supply, fill #0

## 2022-10-19 MED ORDER — OXYCODONE HCL 5 MG PO TABS
5.0000 mg | ORAL_TABLET | ORAL | 0 refills | Status: DC | PRN
  Filled 2022-10-19: qty 10, 2d supply, fill #0

## 2022-10-19 MED ORDER — ACETAMINOPHEN 500 MG PO TABS
500.0000 mg | ORAL_TABLET | Freq: Three times a day (TID) | ORAL | 0 refills | Status: AC
  Filled 2022-10-19: qty 30, 10d supply, fill #0

## 2022-10-19 NOTE — Progress Notes (Signed)
Chief Complaint: Left hip pain     History of Present Illness:   10/19/2022: Presents today for follow-up status post left femoral acetabular injection.  He states that he did overall get multiple hours of at least 80% relief.  Barry Nichols is a 47 y.o. male presents today with predominantly lateral based hip pain.  He has been living with this for several months and this has become very limiting in most activities.  He has pain with lying directly on the side.  He does have weakness with going up and down stairs.  He does have a history of being on steroids longer-term status post kidney transplant.  He did have an MRI with Dr. 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  He is here today for further discussion.    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, Bilateral   Migraine    "when I was young" (08/25/2016"   Posterior equinus, acquired    Bilateral   Pre-diabetes    Past Surgical History:  Procedure Laterality Date   AV FISTULA PLACEMENT Left 10/15/2016   Procedure: ARTERIOVENOUS (AV) FISTULA CREATION-LEFT ARM;  Surgeon: Fransisco Hertz, MD;  Location: [facility] OR;  Service: Vascular;  Laterality: Left;   INSERTION OF DIALYSIS CATHETER Right 10/15/2016   Procedure: INSERTION OF DIALYSIS CATHETER;  Surgeon: Fransisco Hertz, MD;  Location: [facility] OR;  Service: Vascular;  Laterality: Right;   WISDOM TOOTH EXTRACTION     Social History   Socioeconomic History   Marital status: Married    Spouse name: Not on file   Number of children: 1   Years of  education: BA   Highest education level: Not on file  Occupational History   Not on file  Tobacco Use   Smoking status: Never   Smokeless tobacco: Never  Substance and Sexual Activity   Alcohol use: No   Drug use: No    Comment: formerly used Marijuana   Sexual activity: Not on file  Other Topics Concern   Not on file  Social History Narrative   Drinks tea 3-5 times a week    Social Determinants of Health   Financial Resource Strain: Not on file  Food Insecurity: Not on file  Transportation Needs: Not on file  Physical Activity: Not on file  Stress: Not on file  Social Connections: Not on file   Family History  Problem Relation Age of Onset   Hypertension Father        Also maternal grandmother   Diabetes Father        also maternal grandmother   CVA Father    Cancer Maternal Grandfather        lung   Heart attack Maternal Grandfather    Allergies  Allergen Reactions   No Known Allergies    Current Outpatient Medications  Medication Sig Dispense Refill   amLODipine (NORVASC) 10 MG tablet Take 1 tablet (10 mg total) by mouth at bedtime. 30 tablet 0   calcitRIOL (ROCALTROL) 0.25 MCG capsule Take 0.5 mcg by mouth daily.      carvedilol (COREG) 25 MG tablet Take 1 tablet (25 mg total) by mouth 2 (two) times daily with a meal. 60 tablet 0   cloNIDine (CATAPRES) 0.3 MG tablet Take 1 tablet (0.3 mg total) by mouth 3 (three) times daily. 90 tablet 0   methocarbamol (ROBAXIN) 500 MG tablet Take 1 tablet (500 mg total) by mouth every 6 (six) hours as needed for muscle spasms. 30 tablet 0   methylPREDNISolone (MEDROL DOSEPAK) 4 MG TBPK tablet Take as directed 21 tablet 0   minoxidil (LONITEN) 10 MG tablet Take 10 mg by mouth 2 (two) times daily.      No current facility-administered medications for this visit.   No results found.  Review of Systems:   A ROS was performed including pertinent positives and negatives as documented in the HPI.  Physical Exam :    Constitutional: NAD and appears stated age Neurological: Alert and oriented Psych: Appropriate affect and cooperative There were no vitals taken for this visit.   Comprehensive Musculoskeletal Exam:    Inspection Right Left  Skin No atrophy or gross abnormalities appreciated No atrophy or gross abnormalities appreciated  Palpation    Tenderness None Greater trochanter.    Crepitus None None  Range of Motion    Flexion (passive) 120 120  Extension 30 30  IR 30 30  ER 45 45  Strength    Flexion  5/5 5/5  Extension 5/5 5/5  Special Tests    FABER Negative Negative  FADIR Negative Negative  ER Lag/Capsular Insufficiency Negative Negative  Instability Negative Negative  Sacroiliac pain Negative  Negative   Instability    Generalized Laxity No No  Neurologic    sciatic, femoral, obturator nerves intact to light sensation  Vascular/Lymphatic    DP pulse 2+ 2+  Lumbar Exam    Patient has symmetric lumbar range of motion with negative pain referral to hip  Positive Trendelenburg Imaging:   Xray (3 views left hip): Normal  MRI (left hip): There is a focus of avascular necrosis involving the central portion of the femoral head up to the weightbearing surface without collapse.  There is an intrasubstance tear at the junction of the gluteus medius and minimus  I personally reviewed and interpreted the radiographs.   Assessment:   47 y.o. male presents with left hip pain.  Given the fact that he did get extremely good relief greater than 80% of his left femoral acetabular lidocaine injection I do believe he would be ultimately a candidate for left hip core decompression.  We did discuss risks and benefits of this.  We did discuss limitations.  I did discuss that specifically because he has not collapsed and he does have a moderate combined necrosis angle that I do believe that he would be a good candidate for this.  He is hoping to defer arthroplasty at this time.  He has worked  with physical therapy extensively without any relief.  He has multiple injections.  After long discussion he has elected for left hip core decompression with bone marrow aspirate concentrate  -Plan for left hip core decompression with bone marrow aspirate concentration aspiration from left iliac crest\    After a lengthy discussion of treatment options, including risks, benefits, alternatives, complications  of surgical and nonsurgical conservative options, the patient elected surgical repair.   The patient  is aware of the material risks  and complications including, but not limited to injury to adjacent structures, neurovascular injury, infection, numbness, bleeding, implant failure, thermal burns, stiffness, persistent pain, failure to heal, disease transmission from allograft, need for further surgery, dislocation, anesthetic risks, blood clots, risks of death,and others. The probabilities of surgical success and failure discussed with patient given their particular co-morbidities.The time and nature of expected rehabilitation and recovery was discussed.The patient's questions were all answered preoperatively.  No barriers to understanding were noted. I explained the natural history of the disease process and Rx rationale.  I explained to the patient what I considered to be reasonable expectations given their personal situation.  The final treatment plan was arrived at through a shared patient decision making process model.        I personally saw and evaluated the patient, and participated in the management and treatment plan.  Huel Cote, MD Attending Physician, Orthopedic Surgery  This document was dictated using Dragon voice recognition software. A reasonable attempt at proof reading has been made to minimize errors.

## 2022-11-26 NOTE — Progress Notes (Addendum)
Surgical Instructions   Your procedure is scheduled on August 19. Report to [facility] Main Entrance "A" at 1:30pm A.M., then check in with the Admitting office. Any questions or running late day of surgery: call [phone]  Questions prior to your surgery date: call [phone], Monday-Friday, 8am-4pm. If you experience any cold or flu symptoms such as cough, fever, chills, shortness of breath, etc. between now and your scheduled surgery, please notify us at the above number.     Remember:  Do not eat after midnight the night before your surgery   You may drink clear liquids until 12:30pm the day of your surgery.   Clear liquids allowed are: Water, Non-Citrus Juices (without pulp), Carbonated Beverages, Clear Tea, Black Coffee Only (NO MILK, CREAM OR POWDERED CREAMER of any kind), and Gatorade.  The day of surgery (if you have diabetes): Drink ONE (1) 12 oz G2 given to you in your pre admission testing appointment by 12:30pm the day of surgery. Drink in one sitting. Do not sip.  This drink was given to you during your hospital  pre-op appointment visit.  Nothing else to drink after completing the  12 oz bottle of G2.         If you have questions, please contact your surgeon's office.    Take these medicines the morning of surgery with A SIP OF WATER carvedilol (COREG)  amLODipine (NORVASC)  cloNIDine (CATAPRES)  fenofibrate (TRICOR) icosapent Ethyl (VASCEPA)  mycophenolate (CELLCEPT)  predniSONE (DELTASONE)  tacrolimus (PROGRAF)   May take these medicines IF NEEDED: oxyCODONE (ROXICODONE)    One week prior to surgery, STOP taking any Aspirin (unless otherwise instructed by your surgeon) Aleve, Naproxen, Ibuprofen, Motrin, Advil, Goody's, BC's, all herbal medications, fish oil, and non-prescription vitamins.          WHAT DO I DO ABOUT MY DIABETES MEDICATION?   HOLD MOUNJARO 7 DAYS PRIOR TO SURGERY.  DO NOT TAKE AFTER   AUGUST 11.  THE NIGHT BEFORE SURGERY, take 15   units of degludec (TRESIBA) insulin.       If your CBG is greater than 220 mg/dL the morning of surgery , you may take  of your sliding scale (correction) dose of insulin lispro (HUMALOG KWIKPEN) - 12 -20 units.    HOW TO MANAGE YOUR DIABETES BEFORE AND AFTER SURGERY  Why is it important to control my blood sugar before and after surgery? Improving blood sugar levels before and after surgery helps healing and can limit problems. A way of improving blood sugar control is eating a healthy diet by:  Eating less sugar and carbohydrates  Increasing activity/exercise  Talking with your doctor about reaching your blood sugar goals High blood sugars (greater than 180 mg/dL) can raise your risk of infections and slow your recovery, so you will need to focus on controlling your diabetes during the weeks before surgery. Make sure that the doctor who takes care of your diabetes knows about your planned surgery including the date and location.  How do I manage my blood sugar before surgery? Check your blood sugar at least 4 times a day, starting 2 days before surgery, to make sure that the level is not too high or low.  Check your blood sugar the morning of your surgery when you wake up and every 2 hours until you get to the Short Stay unit.  If your blood sugar is less than 70 mg/dL, you will need to treat for low blood sugar: Do not take insulin.  Treat a low blood sugar (less than 70 mg/dL) with  cup of clear juice (cranberry or apple), 4 glucose tablets, OR glucose gel. Recheck blood sugar in 15 minutes after treatment (to make sure it is greater than 70 mg/dL). If your blood sugar is not greater than 70 mg/dL on recheck, call [phone] for further instructions. Report your blood sugar to the short stay nurse when you get to Short Stay.  If you are admitted to the hospital after surgery: Your blood sugar will be checked by the staff and you will probably be given insulin after surgery  (instead of oral diabetes medicines) to make sure you have good blood sugar levels. The goal for blood sugar control after surgery is 80-180 mg/dL.            Do NOT Smoke (Tobacco/Vaping) for 24 hours prior to your procedure.  If you use a CPAP at night, you may bring your mask/headgear for your overnight stay.   You will be asked to remove any contacts, glasses, piercing's, hearing aid's, dentures/partials prior to surgery. Please bring cases for these items if needed.    Patients discharged the day of surgery will not be allowed to drive home, and someone needs to stay with them for 24 hours.  SURGICAL WAITING ROOM VISITATION Patients may have no more than 2 support people in the waiting area - these visitors may rotate.   Pre-op nurse will coordinate an appropriate time for 1 ADULT support person, who may not rotate, to accompany patient in pre-op.  Children under the age of 81 must have an adult with them who is not the patient and must remain in the main waiting area with an adult.  If the patient needs to stay at the hospital during part of their recovery, the visitor guidelines for inpatient rooms apply.  Please refer to the [facility] website for the visitor guidelines for any additional information.   If you received a COVID test during your pre-op visit  it is requested that you wear a mask when out in public, stay away from anyone that may not be feeling well and notify your surgeon if you develop symptoms. If you have been in contact with anyone that has tested positive in the last 10 days please notify you surgeon.      Pre-operative CHG Bathing Instructions   You can play a key role in reducing the risk of infection after surgery. Your skin needs to be as free of germs as possible. You can reduce the number of germs on your skin by washing with CHG (chlorhexidine gluconate) soap before surgery. CHG is an antiseptic soap that kills germs and continues to kill germs even  after washing.   DO NOT use if you have an allergy to chlorhexidine/CHG or antibacterial soaps. If your skin becomes reddened or irritated, stop using the CHG and notify one of our RNs at [phone].              TAKE A SHOWER THE NIGHT BEFORE SURGERY AND THE DAY OF SURGERY    Please keep in mind the following:  DO NOT shave, including legs and underarms, 48 hours prior to surgery.   You may shave your face before/day of surgery.  Place clean sheets on your bed the night before surgery Use a clean washcloth (not used since being washed) for each shower. DO NOT sleep with pet's night before surgery.  CHG Shower Instructions:  If you choose to wash your  hair and private area, wash first with your normal shampoo/soap.  After you use shampoo/soap, rinse your hair and body thoroughly to remove shampoo/soap residue.  Turn the water OFF and apply half the bottle of CHG soap to a CLEAN washcloth.  Apply CHG soap ONLY FROM YOUR NECK DOWN TO YOUR TOES (washing for 3-5 minutes)  DO NOT use CHG soap on face, private areas, open wounds, or sores.  Pay special attention to the area where your surgery is being performed.  If you are having back surgery, having someone wash your back for you may be helpful. Wait 2 minutes after CHG soap is applied, then you may rinse off the CHG soap.  Pat dry with a clean towel  Put on clean pajamas    Additional instructions for the day of surgery: DO NOT APPLY any lotions, deodorants, cologne, or perfumes.   Do not wear jewelry or makeup Do not wear nail polish, gel polish, artificial nails, or any other type of covering on natural nails (fingers and toes) Do not bring valuables to the hospital. [facility] is not responsible for valuables/personal belongings. Put on clean/comfortable clothes.  Please brush your teeth.  Ask your nurse before applying any prescription medications to the skin.

## 2022-11-27 ENCOUNTER — Encounter (HOSPITAL_COMMUNITY): Payer: Self-pay

## 2022-11-27 ENCOUNTER — Encounter (HOSPITAL_COMMUNITY)
Admission: RE | Admit: 2022-11-27 | Discharge: 2022-11-27 | Disposition: A | Discharge status: Home / Self Care | Payer: 59 | Source: Ambulatory Visit | Attending: Orthopaedic Surgery | Admitting: Orthopaedic Surgery

## 2022-11-27 ENCOUNTER — Other Ambulatory Visit: Payer: Self-pay

## 2022-11-27 VITALS — BP 138/95 | HR 103 | Temp 98.3°F | Resp 18 | Ht 70.0 in | Wt 240.8 lb

## 2022-11-27 DIAGNOSIS — Z01812 Encounter for preprocedural laboratory examination: Secondary | ICD-10-CM | POA: Insufficient documentation

## 2022-11-27 DIAGNOSIS — E1122 Type 2 diabetes mellitus with diabetic chronic kidney disease: Secondary | ICD-10-CM | POA: Insufficient documentation

## 2022-11-27 DIAGNOSIS — Z01810 Encounter for preprocedural cardiovascular examination: Secondary | ICD-10-CM | POA: Insufficient documentation

## 2022-11-27 DIAGNOSIS — R000 Tachycardia, unspecified: Secondary | ICD-10-CM | POA: Diagnosis not present

## 2022-11-27 DIAGNOSIS — Z940 Kidney transplant status: Secondary | ICD-10-CM | POA: Diagnosis not present

## 2022-11-27 DIAGNOSIS — I120 Hypertensive chronic kidney disease with stage 5 chronic kidney disease or end stage renal disease: Secondary | ICD-10-CM | POA: Insufficient documentation

## 2022-11-27 DIAGNOSIS — N186 End stage renal disease: Secondary | ICD-10-CM | POA: Diagnosis not present

## 2022-11-27 DIAGNOSIS — Z01818 Encounter for other preprocedural examination: Secondary | ICD-10-CM | POA: Diagnosis present

## 2022-11-27 HISTORY — DX: Unspecified osteoarthritis, unspecified site: M19.90

## 2022-11-27 HISTORY — DX: Type 2 diabetes mellitus without complications: E11.9

## 2022-11-27 LAB — GLUCOSE, CAPILLARY: Glucose-Capillary: 175 mg/dL — ABNORMAL HIGH (ref 70–99)

## 2022-11-27 LAB — CBC
HCT: 38.4 % — ABNORMAL LOW (ref 39.0–52.0)
Hemoglobin: 12.6 g/dL — ABNORMAL LOW (ref 13.0–17.0)
MCH: 28.4 pg (ref 26.0–34.0)
MCHC: 32.8 g/dL (ref 30.0–36.0)
MCV: 86.5 fL (ref 80.0–100.0)
Platelets: 198 10*3/uL (ref 150–400)
RBC: 4.44 MIL/uL (ref 4.22–5.81)
RDW: 13.4 % (ref 11.5–15.5)
WBC: 3.8 10*3/uL — ABNORMAL LOW (ref 4.0–10.5)
nRBC: 0 % (ref 0.0–0.2)

## 2022-11-27 LAB — HEMOGLOBIN A1C
Hgb A1c MFr Bld: 8.2 % — ABNORMAL HIGH (ref 4.8–5.6)
Mean Plasma Glucose: 188.64 mg/dL

## 2022-11-27 LAB — BASIC METABOLIC PANEL
Anion gap: 9 (ref 5–15)
BUN: 16 mg/dL (ref 6–20)
CO2: 21 mmol/L — ABNORMAL LOW (ref 22–32)
Calcium: 8.9 mg/dL (ref 8.9–10.3)
Chloride: 106 mmol/L (ref 98–111)
Creatinine, Ser: 1.74 mg/dL — ABNORMAL HIGH (ref 0.61–1.24)
GFR, Estimated: 48 mL/min — ABNORMAL LOW (ref >60)
Glucose, Bld: 175 mg/dL — ABNORMAL HIGH (ref 70–99)
Potassium: 3.5 mmol/L (ref 3.5–5.1)
Sodium: 136 mmol/L (ref 135–145)

## 2022-11-27 NOTE — Progress Notes (Signed)
PCP - Sherrie Mustache PA Cardiologist - denies Nephrology - Jeanell Sparrow NP - [facility]  PPM/ICD - denies Device Orders -  Rep Notified -   Chest x-ray - na EKG - 11/27/22 Stress Test - 05/12/17 ECHO - 08/26/16 Cardiac Cath - 05/10/19  Sleep Study - 09/30/16 CPAP - no  Fasting Blood Sugar - 170's Checks Blood Sugar times a day- uses a CGM  Last dose of GLP1 agonist-  Mounjaro  GLP1 instructions: no Mounjaro after 8/11  Blood Thinner Instructions:na Aspirin Instructions:na  ERAS Protcol -clears until 1230pm PRE-SURGERY Ensure or G2- G2  COVID TEST- na   Anesthesia review: yes- hx CHF,renal transplant,Cr 1.74,HgbA1C-8.2. Patient states he has taken Bactrim Monday Wednesday Friday since his renal transplant in 2022 as prophylaxis. Denies any recent infections.   Patient denies shortness of breath, fever, cough and chest pain at PAT appointment   All instructions explained to the patient, with a verbal understanding of the material. Patient agrees to go over the instructions while at home for a better understanding. Patient also instructed to wear a mask when out in public prior to surgery. The opportunity to ask questions was provided.

## 2022-11-30 NOTE — Anesthesia Preprocedure Evaluation (Addendum)
Anesthesia Evaluation  Patient identified by MRN, date of birth, ID band Patient awake    Reviewed: Allergy & Precautions, H&P , NPO status , Patient's Chart, lab work & pertinent test results, reviewed documented beta blocker date and time   Airway Mallampati: III  TM Distance: >3 FB Neck ROM: Full    Dental  (+) Teeth Intact, Dental Advisory Given   Pulmonary  Snores at night, negative sleep study 2022   Pulmonary exam normal breath sounds clear to auscultation       Cardiovascular hypertension (139/95 preop), Pt. on medications and Pt. on home beta blockers Normal cardiovascular exam Rhythm:Regular Rate:Normal  Heart cath 05/10/2019 (Care Everywhere): FINDINGS  Mild irregularities in the coroanry arteries.  No significant disease  Diagnostic Procedure Recommendations  False positive stress test  Continue preventive medicine  From cardiac stand point the patient is clear to enter in the kidney  transplant list   TTE 04/18/2019 (Care Everywhere): SUMMARY  Severe left ventricular hypertrophy. Left ventricular systolic  function is normal. LV ejection fraction = >70%.  LV Global L Strain =--12.5%. Left ventricular filling pattern is  prolonged relaxation. No segmental wall motion abnormalities seen in  the left ventricle.The left ventricular cavity is small.   LVOT gradient at during rest, increases to during  valsalva. THis could be due to volume depleted state with small  concentric LV hypertrophy. No systolic anterior motion of the mitral  valve noted. Less likely to be Hypertrophic cardiomyopathy given  uniform LV hypertrophy.   The right ventricle is normal in size and function.  There is no significant valvular stenosis or regurgitation.  The aortic sinus is normal size.  IVC size was normal.  There is no pericardial effusion.  Compared to prior study dated 05-12-17, the LV cavity size is smaller  and  LVOT gradient is now noted.    Neuro/Psych  Headaches  negative psych ROS   GI/Hepatic negative GI ROS, Neg liver ROS,,,  Endo/Other  diabetes, Well Controlled, Type 2, Insulin Dependent  Took 15 units humalog 10am FS 181 in preop Obesity BMI 34 A1c 8.2  Renal/GU Renal diseaseESRD s/p kidney transplant 06/15/2020 Cr 1.74  negative genitourinary   Musculoskeletal  (+) Arthritis , Osteoarthritis,    Abdominal  (+) + obese  Peds negative pediatric ROS (+)  Hematology negative hematology ROS (+)   Anesthesia Other Findings Mounjaro LD 2 weeks ago  Reproductive/Obstetrics negative OB ROS                             Anesthesia Physical Anesthesia Plan  ASA: 3  Anesthesia Plan: General   Post-op Pain Management: Tylenol PO (pre-op)*   Induction: Intravenous  PONV Risk Score and Plan: 2 and Ondansetron, Dexamethasone, Midazolam and Treatment may vary due to age or medical condition  Airway Management Planned: Oral ETT  Additional Equipment: None  Intra-op Plan:   Post-operative Plan: Extubation in OR  Informed Consent: I have reviewed the patients History and Physical, chart, labs and discussed the procedure including the risks, benefits and alternatives for the proposed anesthesia with the patient or authorized representative who has indicated his/her understanding and acceptance.     Dental advisory given  Plan Discussed with: CRNA  Anesthesia Plan Comments: (  )        Anesthesia Quick Evaluation

## 2022-11-30 NOTE — Progress Notes (Signed)
Anesthesia Chart Review:  47 year old male with pertinent history including IDDM 2 (A1c 8.2 on 11/27/2022), HTN, ESRD s/p kidney transplant 06/30/2020 by deceased donor.  Preop labs reviewed, kidney function appears stable with creatinine 1.74, mild anemia with hemoglobin 12.6, otherwise unremarkable.  EKG 11/27/2022: Sinus tachycardia.  Rate 103.  Cannot rule out inferior infarct, age-indeterminate.  Heart cath 05/10/2019 (Care Everywhere): FINDINGS  Mild irregularities in the coroanry arteries.  No significant disease  Diagnostic Procedure Recommendations  False positive stress test  Continue preventive medicine  From cardiac stand point the patient is clear to enter in the kidney  transplant list   TTE 04/18/2019 (Care Everywhere): SUMMARY  Severe left ventricular hypertrophy. Left ventricular systolic  function is normal. LV ejection fraction = >70%.  LV Global L Strain =--12.5%. Left ventricular filling pattern is  prolonged relaxation. No segmental wall motion abnormalities seen in  the left ventricle.The left ventricular cavity is small.   LVOT gradient at during rest, increases to during  valsalva. THis could be due to volume depleted state with small  concentric LV hypertrophy. No systolic anterior motion of the mitral  valve noted. Less likely to be Hypertrophic cardiomyopathy given  uniform LV hypertrophy.   The right ventricle is normal in size and function.  There is no significant valvular stenosis or regurgitation.  The aortic sinus is normal size.  IVC size was normal.  There is no pericardial effusion.  Compared to prior study dated 05-12-17, the LV cavity size is smaller  and LVOT gradient is now noted.   Of note, cardiologist at [facility] Dr. Wende Bushy commented on this echocardiogram result in office visit note 05/25/2019 when he cleared patient to proceed with renal transplant.  Per note, "Intracardiac gradient: Even though the echocardiogram shows  the presence of a gradient at rest as well as increased with Valsalva, during the heart catheterization there was no gradient across the aortic valve. For the reason highly likely this gradient was produced by the patient LVH and volume depletion. Clearly is not related with valvular heart disease. Trying to decrease this patient heart rate clearly will improve it. Patient does not have a hypertrophic cardiomyopathy. Actually echocardiogram from a year before did not show any gradient whatsoever and the main difference is that the cavity size was significantly smaller in the present echocardiogram."  His Coreg was increased at that time.  No further evaluation was recommended.    Zannie Cove [facility] Short Stay Center/Anesthesiology Phone [phone] 11/30/2022 10:41 AM

## 2022-12-07 ENCOUNTER — Other Ambulatory Visit: Payer: Self-pay

## 2022-12-07 ENCOUNTER — Ambulatory Visit (HOSPITAL_COMMUNITY): Payer: 59 | Admitting: Physician Assistant

## 2022-12-07 ENCOUNTER — Encounter (HOSPITAL_COMMUNITY)
Admission: RE | Disposition: A | Discharge status: Home / Self Care | Payer: Self-pay | Source: Home / Self Care | Attending: Orthopaedic Surgery

## 2022-12-07 ENCOUNTER — Ambulatory Visit (HOSPITAL_BASED_OUTPATIENT_CLINIC_OR_DEPARTMENT_OTHER): Payer: 59 | Admitting: Anesthesiology

## 2022-12-07 ENCOUNTER — Ambulatory Visit (HOSPITAL_COMMUNITY): Payer: 59

## 2022-12-07 ENCOUNTER — Encounter (HOSPITAL_COMMUNITY): Payer: Self-pay | Admitting: Orthopaedic Surgery

## 2022-12-07 ENCOUNTER — Ambulatory Visit (HOSPITAL_COMMUNITY)
Admission: RE | Admit: 2022-12-07 | Discharge: 2022-12-07 | Disposition: A | Discharge status: Home / Self Care | Payer: 59 | Attending: Orthopaedic Surgery | Admitting: Orthopaedic Surgery

## 2022-12-07 DIAGNOSIS — I120 Hypertensive chronic kidney disease with stage 5 chronic kidney disease or end stage renal disease: Secondary | ICD-10-CM

## 2022-12-07 DIAGNOSIS — M87052 Idiopathic aseptic necrosis of left femur: Secondary | ICD-10-CM | POA: Diagnosis not present

## 2022-12-07 DIAGNOSIS — I509 Heart failure, unspecified: Secondary | ICD-10-CM | POA: Diagnosis not present

## 2022-12-07 DIAGNOSIS — I130 Hypertensive heart and chronic kidney disease with heart failure and stage 1 through stage 4 chronic kidney disease, or unspecified chronic kidney disease: Secondary | ICD-10-CM | POA: Diagnosis not present

## 2022-12-07 DIAGNOSIS — Z992 Dependence on renal dialysis: Secondary | ICD-10-CM

## 2022-12-07 DIAGNOSIS — Z01818 Encounter for other preprocedural examination: Secondary | ICD-10-CM

## 2022-12-07 DIAGNOSIS — N186 End stage renal disease: Secondary | ICD-10-CM

## 2022-12-07 DIAGNOSIS — N184 Chronic kidney disease, stage 4 (severe): Secondary | ICD-10-CM | POA: Insufficient documentation

## 2022-12-07 DIAGNOSIS — M879 Osteonecrosis, unspecified: Secondary | ICD-10-CM | POA: Insufficient documentation

## 2022-12-07 DIAGNOSIS — Z940 Kidney transplant status: Secondary | ICD-10-CM | POA: Diagnosis not present

## 2022-12-07 HISTORY — PX: DECOMPRESSION HIP-CORE: SHX5012

## 2022-12-07 LAB — GLUCOSE, CAPILLARY
Glucose-Capillary: 181 mg/dL — ABNORMAL HIGH (ref 70–99)
Glucose-Capillary: 168 mg/dL — ABNORMAL HIGH (ref 70–99)

## 2022-12-07 SURGERY — CORE DECOMPRESSION, FEMUR, HEAD
Anesthesia: General | Site: Hip | Laterality: Left

## 2022-12-07 MED ORDER — ORAL CARE MOUTH RINSE: 15.0000 mL | Freq: Once | OROMUCOSAL | Status: AC

## 2022-12-07 MED ORDER — LACTATED RINGERS IV SOLN: INTRAVENOUS | Status: DC

## 2022-12-07 MED ORDER — SUGAMMADEX SODIUM 200 MG/2ML IV SOLN
INTRAVENOUS | Status: DC | PRN
  Administered 2022-12-07: 200 mg via INTRAVENOUS

## 2022-12-07 MED ORDER — ROCURONIUM BROMIDE 10 MG/ML (PF) SYRINGE
PREFILLED_SYRINGE | INTRAVENOUS | Status: DC | PRN
  Administered 2022-12-07: 80 mg via INTRAVENOUS

## 2022-12-07 MED ORDER — LIDOCAINE 2% (20 MG/ML) 5 ML SYRINGE
INTRAMUSCULAR | Status: DC | PRN
  Administered 2022-12-07: 60 mg via INTRAVENOUS

## 2022-12-07 MED ORDER — CEFAZOLIN SODIUM-DEXTROSE 2-4 GM/100ML-% IV SOLN
2.0000 g | INTRAVENOUS | Status: AC
  Administered 2022-12-07: 2 g via INTRAVENOUS
  Filled 2022-12-07: qty 100

## 2022-12-07 MED ORDER — INSULIN ASPART 100 UNIT/ML IJ SOLN
INTRAMUSCULAR | Status: AC
  Administered 2022-12-07: 4 [IU] via SUBCUTANEOUS
  Filled 2022-12-07: qty 1

## 2022-12-07 MED ORDER — PROPOFOL 10 MG/ML IV BOLUS
INTRAVENOUS | Status: DC | PRN
  Administered 2022-12-07: 200 mg via INTRAVENOUS

## 2022-12-07 MED ORDER — GABAPENTIN 300 MG PO CAPS
300.0000 mg | ORAL_CAPSULE | Freq: Once | ORAL | Status: AC
  Administered 2022-12-07: 300 mg via ORAL
  Filled 2022-12-07: qty 1

## 2022-12-07 MED ORDER — TRANEXAMIC ACID-NACL 1000-0.7 MG/100ML-% IV SOLN
1000.0000 mg | INTRAVENOUS | Status: AC
  Administered 2022-12-07: 1000 mg via INTRAVENOUS
  Filled 2022-12-07: qty 100

## 2022-12-07 MED ORDER — ONDANSETRON HCL 4 MG/2ML IJ SOLN: 4.0000 mg | Freq: Once | INTRAMUSCULAR | Status: DC | PRN

## 2022-12-07 MED ORDER — AMISULPRIDE (ANTIEMETIC) 5 MG/2ML IV SOLN: 10.0000 mg | Freq: Once | INTRAVENOUS | Status: DC | PRN

## 2022-12-07 MED ORDER — CHLORHEXIDINE GLUCONATE 0.12 % MT SOLN
15.0000 mL | Freq: Once | OROMUCOSAL | Status: AC
  Administered 2022-12-07: 15 mL via OROMUCOSAL
  Filled 2022-12-07: qty 15

## 2022-12-07 MED ORDER — OXYCODONE HCL 5 MG PO TABS
ORAL_TABLET | ORAL | Status: AC
  Filled 2022-12-07: qty 1

## 2022-12-07 MED ORDER — HEPARIN SODIUM (PORCINE) 1000 UNIT/ML IJ SOLN
INTRAMUSCULAR | Status: AC
  Filled 2022-12-07: qty 1

## 2022-12-07 MED ORDER — OXYCODONE HCL 5 MG PO TABS
5.0000 mg | ORAL_TABLET | Freq: Once | ORAL | Status: AC | PRN
  Administered 2022-12-07: 5 mg via ORAL

## 2022-12-07 MED ORDER — MIDAZOLAM HCL 2 MG/2ML IJ SOLN
INTRAMUSCULAR | Status: AC
  Filled 2022-12-07: qty 2

## 2022-12-07 MED ORDER — INSULIN ASPART 100 UNIT/ML IJ SOLN: 0.0000 [IU] | INTRAMUSCULAR | Status: DC | PRN

## 2022-12-07 MED ORDER — OXYCODONE HCL 5 MG/5ML PO SOLN: 5.0000 mg | Freq: Once | ORAL | Status: AC | PRN

## 2022-12-07 MED ORDER — MIDAZOLAM HCL 2 MG/2ML IJ SOLN
INTRAMUSCULAR | Status: DC | PRN
  Administered 2022-12-07 (×2): 1 mg via INTRAVENOUS

## 2022-12-07 MED ORDER — ONDANSETRON HCL 4 MG/2ML IJ SOLN
INTRAMUSCULAR | Status: DC | PRN
Start: 2022-12-07 — End: 2022-12-07
  Administered 2022-12-07: 4 mg via INTRAVENOUS

## 2022-12-07 MED ORDER — FENTANYL CITRATE (PF) 250 MCG/5ML IJ SOLN
INTRAMUSCULAR | Status: DC | PRN
  Administered 2022-12-07: 50 ug via INTRAVENOUS
  Administered 2022-12-07 (×2): 100 ug via INTRAVENOUS

## 2022-12-07 MED ORDER — ACETAMINOPHEN 500 MG PO TABS
1000.0000 mg | ORAL_TABLET | Freq: Once | ORAL | Status: AC
  Administered 2022-12-07: 1000 mg via ORAL
  Filled 2022-12-07: qty 2

## 2022-12-07 MED ORDER — HYDROMORPHONE HCL 1 MG/ML IJ SOLN: 0.2500 mg | INTRAMUSCULAR | Status: DC | PRN

## 2022-12-07 MED ORDER — PROPOFOL 10 MG/ML IV BOLUS
INTRAVENOUS | Status: AC
  Filled 2022-12-07: qty 20

## 2022-12-07 MED ORDER — 0.9 % SODIUM CHLORIDE (POUR BTL) OPTIME
TOPICAL | Status: DC | PRN
  Administered 2022-12-07: 1000 mL

## 2022-12-07 MED ORDER — FENTANYL CITRATE (PF) 250 MCG/5ML IJ SOLN
INTRAMUSCULAR | Status: AC
  Filled 2022-12-07: qty 5

## 2022-12-07 SURGICAL SUPPLY — 47 items
BAG COUNTER SPONGE SURGICOUNT (BAG) ×1 IMPLANT
BAG SPNG CNTER NS LX DISP (BAG) ×1
CEMENT BONESYNC FAST SET 3 (Cement) IMPLANT
DRAPE C-ARM 42X72 X-RAY (DRAPES) IMPLANT
DRAPE C-ARMOR (DRAPES) IMPLANT
DRAPE STERI IOBAN 125X83 (DRAPES) ×1 IMPLANT
DRAPE SURG 17X23 STRL (DRAPES) ×1 IMPLANT
DRESSING MEPILEX FLEX 4X4 (GAUZE/BANDAGES/DRESSINGS) IMPLANT
DRSG ADAPTIC 3X8 NADH LF (GAUZE/BANDAGES/DRESSINGS) ×1 IMPLANT
DRSG MEPILEX FLEX 4X4 (GAUZE/BANDAGES/DRESSINGS) ×1
DRSG MEPILEX POST OP 4X12 (GAUZE/BANDAGES/DRESSINGS) IMPLANT
DRSG MEPILEX POST OP 4X8 (GAUZE/BANDAGES/DRESSINGS) IMPLANT
ELECT CAUTERY BLADE 6.4 (BLADE) ×1 IMPLANT
ELECT REM PT RETURN 9FT ADLT (ELECTROSURGICAL)
ELECTRODE REM PT RTRN 9FT ADLT (ELECTROSURGICAL) ×1 IMPLANT
EVACUATOR 1/8 PVC DRAIN (DRAIN) IMPLANT
GAUZE PAD ABD 8X10 STRL (GAUZE/BANDAGES/DRESSINGS) ×2 IMPLANT
GAUZE SPONGE 4X4 12PLY STRL (GAUZE/BANDAGES/DRESSINGS) ×1 IMPLANT
GLOVE BIOGEL PI IND STRL 7.5 (GLOVE) ×3 IMPLANT
GLOVE BIOGEL PI IND STRL 8 (GLOVE) ×1 IMPLANT
GLOVE ORTHO TXT STRL SZ7.5 (GLOVE) ×2 IMPLANT
GLOVE SURG ENC MOIS LTX SZ6 (GLOVE) ×1 IMPLANT
GLOVE SURG ORTHO 8.0 STRL STRW (GLOVE) ×1 IMPLANT
GOWN STRL REUS W/ TWL LRG LVL3 (GOWN DISPOSABLE) ×3 IMPLANT
GOWN STRL REUS W/TWL LRG LVL3 (GOWN DISPOSABLE) ×3
KIT BASIN OR (CUSTOM PROCEDURE TRAY) ×1 IMPLANT
KIT BONE MRW ASP ANGEL CPRP (KITS) IMPLANT
KIT HIP ARTHROSCOPY IOBP (KITS) IMPLANT
KIT HIP AVN DISP (KITS) IMPLANT
KIT TURNOVER KIT B (KITS) ×1 IMPLANT
MANIFOLD NEPTUNE II (INSTRUMENTS) ×1 IMPLANT
NS IRRIG 1000ML POUR BTL (IV SOLUTION) ×1 IMPLANT
PACK GENERAL/GYN (CUSTOM PROCEDURE TRAY) ×1 IMPLANT
PAD ARMBOARD 7.5X6 YLW CONV (MISCELLANEOUS) ×2 IMPLANT
PUTTY DBM ALLOSYNC PURE 5CC (Putty) IMPLANT
STAPLER VISISTAT 35W (STAPLE) ×1 IMPLANT
SUT ETHILON 3 0 PS 1 (SUTURE) IMPLANT
SUT VIC AB 0 CT1 27 (SUTURE)
SUT VIC AB 0 CT1 27XBRD ANBCTR (SUTURE) ×2 IMPLANT
SUT VIC AB 1 CT1 27 (SUTURE)
SUT VIC AB 1 CT1 27XBRD ANBCTR (SUTURE) ×1 IMPLANT
SUT VIC AB 2-0 CT1 (SUTURE) IMPLANT
SUT VIC AB 2-0 CT1 27 (SUTURE)
SUT VIC AB 2-0 CT1 TAPERPNT 27 (SUTURE) ×1 IMPLANT
TOWEL GREEN STERILE (TOWEL DISPOSABLE) ×1 IMPLANT
TOWEL GREEN STERILE FF (TOWEL DISPOSABLE) ×1 IMPLANT
WATER STERILE IRR 1000ML POUR (IV SOLUTION) ×1 IMPLANT

## 2022-12-07 NOTE — Transfer of Care (Signed)
Immediate Anesthesia Transfer of Care Note  Patient: Barry Nichols  Procedure(s) Performed: LEFT HIP CORE DECOMPRESSION WITH ILIAC CREST BONE MARROW ASPIRATE (Left: Hip)  Patient Location: PACU  Anesthesia Type:General  Level of Consciousness: awake, alert , and oriented  Airway & Oxygen Therapy: Patient Spontanous Breathing  Post-op Assessment: Report given to RN and Post -op Vital signs reviewed and stable  Post vital signs: Reviewed and stable  Last Vitals:  Vitals Value Taken Time  BP 142/96 12/07/22 1643  Temp    Pulse 80 12/07/22 1645  Resp 14 12/07/22 1645  SpO2 95 % 12/07/22 1645  Vitals shown include unfiled device data.  Last Pain:  Vitals:   12/07/22 1349  TempSrc:   PainSc: 0-No pain         Complications: No notable events documented.

## 2022-12-07 NOTE — Interval H&P Note (Signed)
History and Physical Interval Note:  12/07/2022 2:12 PM  Barry Nichols  has presented today for surgery, with the diagnosis of LEFT HIP AVASCULAR NECROSIS.  The various methods of treatment have been discussed with the patient and family. After consideration of risks, benefits and other options for treatment, the patient has consented to  Procedure(s): LEFT HIP CORE DECOMPRESSION WITH ILIAC CREST BONE MARROW ASPIRATE (Left) as a surgical intervention.  The patient's history has been reviewed, patient examined, no change in status, stable for surgery.  I have reviewed the patient's chart and labs.  Questions were answered to the patient's satisfaction.     Huel Cote

## 2022-12-07 NOTE — Anesthesia Postprocedure Evaluation (Signed)
Anesthesia Post Note  Patient: Barry Nichols  Procedure(s) Performed: LEFT HIP CORE DECOMPRESSION WITH ILIAC CREST BONE MARROW ASPIRATE (Left: Hip)     Patient location during evaluation: PACU Anesthesia Type: General Level of consciousness: awake and alert, oriented and patient cooperative Pain management: pain level controlled Vital Signs Assessment: post-procedure vital signs reviewed and stable Respiratory status: spontaneous breathing, nonlabored ventilation and respiratory function stable Cardiovascular status: blood pressure returned to baseline and stable Postop Assessment: no apparent nausea or vomiting Anesthetic complications: no   No notable events documented.  Last Vitals:  Vitals:   12/07/22 1645 12/07/22 1700  BP: (!) 133/93 (!) 141/96  Pulse: 80 79  Resp: 18 13  Temp:    SpO2: 95% 95%    Last Pain:  Vitals:   12/07/22 1700  TempSrc:   PainSc: 0-No pain                 Lannie Fields

## 2022-12-07 NOTE — H&P (Signed)
Chief Complaint: Left hip pain        History of Present Illness:    10/19/2022: Presents today for follow-up status post left femoral acetabular injection.  He states that he did overall get multiple hours of at least 80% relief.   Barry Nichols is a 47 y.o. male presents today with predominantly lateral based hip pain.  He has been living with this for several months and this has become very limiting in most activities.  He has pain with lying directly on the side.  He does have weakness with going up and down stairs.  He does have a history of being on steroids longer-term status post kidney transplant.  He did have an MRI with Dr. 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  He is here today for further discussion.       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, Bilateral   Migraine      "when I was young" (08/25/2016"   Posterior equinus, acquired      Bilateral   Pre-diabetes               Past Surgical History:  Procedure Laterality Date   AV FISTULA PLACEMENT Left 10/15/2016    Procedure: ARTERIOVENOUS (AV) FISTULA CREATION-LEFT ARM;  Surgeon: Fransisco Hertz, MD;  Location: [facility] OR;  Service: Vascular;  Laterality: Left;   INSERTION OF DIALYSIS CATHETER Right 10/15/2016    Procedure: INSERTION OF DIALYSIS CATHETER;  Surgeon: Fransisco Hertz, MD;  Location[facility];  Service: Vascular;  Laterality: Right;   WISDOM TOOTH EXTRACTION            Social History         Socioeconomic History   Marital status: Married      Spouse name: Not on  file   Number of children: 1   Years of education: BA   Highest education level: Not on file  Occupational History   Not on file  Tobacco Use   Smoking status: Never   Smokeless tobacco: Never  Substance and Sexual Activity   Alcohol use: No   Drug use: No      Comment: formerly used Marijuana   Sexual activity: Not on file  Other Topics Concern   Not on file  Social History Narrative    Drinks tea 3-5 times a week     Social Determinants of Health    Financial Resource Strain: Not on file  Food Insecurity: Not on file  Transportation Needs: Not on file  Physical Activity: Not on file  Stress: Not on file  Social Connections: Not on file         Family History  Problem Relation Age of Onset   Hypertension Father          Also maternal grandmother   Diabetes Father  also maternal grandmother   CVA Father     Cancer Maternal Grandfather          lung   Heart attack Maternal Grandfather          Allergies      Allergies  Allergen Reactions   No Known Allergies              Current Outpatient Medications  Medication Sig Dispense Refill   amLODipine (NORVASC) 10 MG tablet Take 1 tablet (10 mg total) by mouth at bedtime. 30 tablet 0   calcitRIOL (ROCALTROL) 0.25 MCG capsule Take 0.5 mcg by mouth daily.        carvedilol (COREG) 25 MG tablet Take 1 tablet (25 mg total) by mouth 2 (two) times daily with a meal. 60 tablet 0   cloNIDine (CATAPRES) 0.3 MG tablet Take 1 tablet (0.3 mg total) by mouth 3 (three) times daily. 90 tablet 0   methocarbamol (ROBAXIN) 500 MG tablet Take 1 tablet (500 mg total) by mouth every 6 (six) hours as needed for muscle spasms. 30 tablet 0   methylPREDNISolone (MEDROL DOSEPAK) 4 MG TBPK tablet Take as directed 21 tablet 0   minoxidil (LONITEN) 10 MG tablet Take 10 mg by mouth 2 (two) times daily.           No current facility-administered medications for this visit.      Imaging Results (Last 48 hours)  No results found.      Review of Systems:   A ROS was performed including pertinent positives and negatives as documented in the HPI.   Physical Exam :   Constitutional: NAD and appears stated age Neurological: Alert and oriented Psych: Appropriate affect and cooperative There were no vitals taken for this visit.    Comprehensive Musculoskeletal Exam:     Inspection Right Left  Skin No atrophy or gross abnormalities appreciated No atrophy or gross abnormalities appreciated  Palpation      Tenderness None Greater trochanter.      Crepitus None None  Range of Motion      Flexion (passive) 120 120  Extension 30 30  IR 30 30  ER 45 45  Strength      Flexion  5/5 5/5  Extension 5/5 5/5  Special Tests      FABER Negative Negative  FADIR Negative Negative  ER Lag/Capsular Insufficiency Negative Negative  Instability Negative Negative  Sacroiliac pain Negative  Negative   Instability      Generalized Laxity No No  Neurologic      sciatic, femoral, obturator nerves intact to light sensation  Vascular/Lymphatic      DP pulse 2+ 2+  Lumbar Exam      Patient has symmetric lumbar range of motion with negative pain referral to hip  Positive Trendelenburg Imaging:   Xray (3 views left hip): Normal   MRI (left hip): There is a focus of avascular necrosis involving the central portion of the femoral head up to the weightbearing surface without collapse.  There is an intrasubstance tear at the junction of the gluteus medius and minimus   I personally reviewed and interpreted the radiographs.     Assessment:   47 y.o. male presents with left hip pain.  Given the fact that he did get extremely good relief greater than 80% of his left femoral acetabular lidocaine injection I do believe he would be ultimately a candidate for left hip core decompression.  We did discuss risks and benefits of  this.  We did discuss limitations.  I did discuss that specifically because he has not collapsed and he does have  a moderate combined necrosis angle that I do believe that he would be a good candidate for this.  He is hoping to defer arthroplasty at this time.  He has worked with physical therapy extensively without any relief.  He has multiple injections.  After long discussion he has elected for left hip core decompression with bone marrow aspirate concentrate   -Plan for left hip core decompression with bone marrow aspirate concentration aspiration from left iliac crest\       After a lengthy discussion of treatment options, including risks, benefits, alternatives, complications of surgical and nonsurgical conservative options, the patient elected surgical repair.    The patient  is aware of the material risks  and complications including, but not limited to injury to adjacent structures, neurovascular injury, infection, numbness, bleeding, implant failure, thermal burns, stiffness, persistent pain, failure to heal, disease transmission from allograft, need for further surgery, dislocation, anesthetic risks, blood clots, risks of death,and others. The probabilities of surgical success and failure discussed with patient given their particular co-morbidities.The time and nature of expected rehabilitation and recovery was discussed.The patient's questions were all answered preoperatively.  No barriers to understanding were noted. I explained the natural history of the disease process and Rx rationale.  I explained to the patient what I considered to be reasonable expectations given their personal situation.  The final treatment plan was arrived at through a shared patient decision making process model.               I personally saw and evaluated the patient, and participated in the management and treatment plan.   Huel Cote, MD Attending Physician, Orthopedic Surgery   This document was dictated using Dragon voice recognition software. A reasonable attempt at proof reading has been made to minimize  errors.

## 2022-12-07 NOTE — Discharge Instructions (Signed)
     Discharge Instructions    Attending Surgeon: Huel Cote, MD Office Phone Number: [phone]   Diagnosis and Procedures:    Surgeries Performed: Left hip core decompression  Discharge Plan:    Diet: Resume usual diet. Begin with light or bland foods.  Drink plenty of fluids.  Activity:  Touchdown weight bearing left hip. You are advised to go home directly from the hospital or surgical center. Restrict your activities.  GENERAL INSTRUCTIONS: 1.  Please apply ice to your wound to help with swelling and inflammation. This will improve your comfort and your overall recovery following surgery.     2. Please call Dr. Serena Croissant office at [phone] with questions Monday-Friday during business hours. If no one answers, please leave a message and someone should get back to the patient within 24 hours. For emergencies please call 911 or proceed to the emergency room.   3. Patient to notify surgical team if experiences any of the following: Bowel/Bladder dysfunction, uncontrolled pain, nerve/muscle weakness, incision with increased drainage or redness, nausea/vomiting and Fever greater than 101.0 F.  Be alert for signs of infection including redness, streaking, odor, fever or chills. Be alert for excessive pain or bleeding and notify your surgeon immediately.  WOUND INSTRUCTIONS:   Leave your dressing, cast, or splint in place until your post operative visit.  Keep it clean and dry.  Always keep the incision clean and dry until the staples/sutures are removed. If there is no drainage from the incision you should keep it open to air. If there is drainage from the incision you must keep it covered at all times until the drainage stops  Do not soak in a bath tub, hot tub, pool, lake or other body of water until 21 days after your surgery and your incision is completely dry and healed.  If you have removable sutures (or staples) they must be removed 10-14 days (unless otherwise  instructed) from the day of your surgery.     1)  Elevate the extremity as much as possible.  2)  Keep the dressing clean and dry.  3)  Please call us if the dressing becomes wet or dirty.  4)  If you are experiencing worsening pain or worsening swelling, please call.     MEDICATIONS: Resume all previous home medications at the previous prescribed dose and frequency unless otherwise noted Start taking the  pain medications on an as-needed basis as prescribed  Please taper down pain medication over the next week following surgery.  Ideally you should not require a refill of any narcotic pain medication.  Take pain medication with food to minimize nausea. In addition to the prescribed pain medication, you may take over-the-counter pain relievers such as Tylenol.  Do NOT take additional tylenol if your pain medication already has tylenol in it.  Aspirin 325mg  daily for four weeks.      FOLLOWUP INSTRUCTIONS: 1. Follow up at the Physical Therapy Clinic 3-4 days following surgery. This appointment should be scheduled unless other arrangements have been made.The Physical Therapy scheduling number is [phone] if an appointment has not already been arranged.  2. Contact Dr. Serena Croissant office during office hours at [phone] or the practice after hours line at [phone] for non-emergencies. For medical emergencies call 911.   Discharge Location: Home

## 2022-12-07 NOTE — Anesthesia Procedure Notes (Signed)
Procedure Name: Intubation Date/Time: 12/07/2022 3:13 PM  Performed by: Marena Chancy, CRNAPre-anesthesia Checklist: Patient identified, Emergency Drugs available, Suction available and Patient being monitored Patient Re-evaluated:Patient Re-evaluated prior to induction Oxygen Delivery Method: Circle System Utilized Preoxygenation: Pre-oxygenation with 100% oxygen Induction Type: IV induction Ventilation: Mask ventilation without difficulty Laryngoscope Size: Miller and 2 Grade View: Grade I Tube type: Oral Tube size: 8.0 mm Number of attempts: 1 Airway Equipment and Method: Stylet and Oral airway Placement Confirmation: ETT inserted through vocal cords under direct vision, positive ETCO2 and breath sounds checked- equal and bilateral Tube secured with: Tape Dental Injury: Teeth and Oropharynx as per pre-operative assessment

## 2022-12-07 NOTE — Brief Op Note (Signed)
   Brief Op Note  Date of Surgery: 12/07/2022  Preoperative Diagnosis: LEFT HIP AVASCULAR NECROSIS  Postoperative Diagnosis: same  Procedure: Procedure(s): LEFT HIP CORE DECOMPRESSION WITH ILIAC CREST BONE MARROW ASPIRATE  Implants: Implant Name Type Inv. Item Serial No. Manufacturer Lot No. LRB No. Used Action  PUTTY DBM [employer] PURE 5CC - [id number] Putty PUTTY DBM [employer]OSY[employer] PURE 5CC [id number] [employer]  Left 1 Implanted  CEMENT [employer] FAST SET 3 - [id number] Cement CEMENT [employer] FAST SET 3  [employer] [id number] Left 1 Implanted    Surgeons: Surgeon(s): Huel Cote, MD  Anesthesia: General    Estimated Blood Loss: See anesthesia record  Complications: None  Condition to PACU: Stable  Benancio Deeds, MD 12/07/2022 4:16 PM

## 2022-12-07 NOTE — Op Note (Signed)
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  The risk and benefits of the procedure were discussed in detail and documented in the pre-operative evaluation.   PREOPERATIVE DIAGNOSIS: 1. Left hip avascular necrosis  POSTOPERATIVE DIAGNOSIS: Same.  PROCEDURE: 1. Left hip core decompression with sequestrectomy removal 2. Left hip iliac crest bone marrow removal   SURGEON: Benancio Deeds MD  ASSISTANT: Kerby Less, ATC  ANESTHESIA:  general  IV FLUIDS AND URINE: See anesthesia record.  ANTIBIOTICS: Ancef  ESTIMATED BLOOD LOSS: 10 mL.  IMPLANTS:  Implant Name Type Inv. Item Serial No. Manufacturer Lot No. LRB No. Used Action  PUTTY DBM ALLOSYNC PURE 5CC - [id number] Putty PUTTY DBM ALLOSYNC PURE 5CC [id number] [employer]  Left 1 Implanted  CEMENT BONESYNC FAST SET 3 - [id number] Cement CEMENT BONESYNC FAST SET 3  [employer] [id number] Left 1 Implanted    DRAINS: None  CULTURES: None  COMPLICATIONS: none  DESCRIPTION OF PROCEDURE:  The patient was identified in the preoperative holding area.  The correct site was marked according to universal protocol with nursing.  He was subsequently taken back to the operating room.   In the operating room anesthesia was induced.  He was placed on a radiolucent table in the supine position.  All bony prominences were padded.  At this time fluoroscopy was taken to confirm that the lesion can be visualized on fluoroscopic views.  He was prepped and draped in the usual sterile fashion.  Ancef was given 1 hour prior to skin incision.  Final timeout was again performed.  We started with iliac crest bone marrow harvest.  15 blade was used to incise through skin over the iliac crest 5 cm posterior to the ASIS.  This was taken just through skin.  A snap was then introduced in order to spread down to bone.  Care was taken to identify the  inner and outer wall of the iliac crest as to stay within this confines.  A Jamshidi needle was then introduced into the iliac crest and malleted 3 cm into place.  At this time we are able to draw back a little over 45 cc of bone marrow.  We are happy with this.  The Jamshidi needle was removed.  The wound was closed with 3-0 nylon.   At this time attention was turned to the hip.  A guidewire was used over the anterior aspect of the hip in order to mark out the incision laterally.  This was made at the midportion of the femur.  15 blade was used to incise through skin laterally and then down and through IT band.  Electrocautery was used to achieve hemostasis.  A Cobb elevator was then introduced in order to elevate off the vastus lateralis off of the anterior femur.  At this time the trocar as well as the starting wire were introduced into the lateral aspect of the femur and the guidewire was placed up through the femoral neck at the central portion of the defect which was confirmed on AP and lateral fluoroscopy.  The outer step guide was then [facility] into place.  At this time the 5 mm reamer was then introduced into the bed of the AVN defect.  The expandable reamer was subsequently expanded 2 mm at a time up to 17 mm.  This was remained quite easily in the defect.  While this was happening the bone marrow was  working in the centrifuge and subsequently combined with DBM putty.  The introduction sheath was then placed to the step guide and this was injected into the bed of the AVN defect.  The tamp was used to backfill this and ensure all BMAC was in the bed of the AVN.  At this time calcium phosphate cement was back introduced as the introduction sheath was being taken out.  Fluoroscopy showed good backfill of the defect with calcium phosphate cement.  We are happy with the placement of the [facility] on AP and lateral fluoroscopy.  The wounds were thoroughly irrigated and closed in layers of 2-0 Vicryl and 3-0 nylon.   Dressings were placed with Aquacel over the bone marrow harvest site as well as lateral hip.  All counts were correct at the end of the case.  He was awoken taken the PACU without complication.       POSTOPERATIVE PLAN: He will be touchdown weightbearing on the left leg with crutches for a total of 2 weeks. I will see him back in the clinic for a wound check in 2 weeks.  He will be placed on aspirin for blood clot prevention     Benancio Deeds, MD 4:16 PM

## 2022-12-08 ENCOUNTER — Other Ambulatory Visit (HOSPITAL_BASED_OUTPATIENT_CLINIC_OR_DEPARTMENT_OTHER): Payer: Self-pay | Admitting: Orthopaedic Surgery

## 2022-12-08 DIAGNOSIS — M87052 Idiopathic aseptic necrosis of left femur: Secondary | ICD-10-CM

## 2022-12-09 ENCOUNTER — Encounter (HOSPITAL_COMMUNITY): Payer: Self-pay | Admitting: Orthopaedic Surgery

## 2022-12-14 ENCOUNTER — Ambulatory Visit (HOSPITAL_BASED_OUTPATIENT_CLINIC_OR_DEPARTMENT_OTHER): Payer: 59 | Attending: Orthopaedic Surgery | Admitting: Physical Therapy

## 2022-12-14 ENCOUNTER — Encounter (HOSPITAL_BASED_OUTPATIENT_CLINIC_OR_DEPARTMENT_OTHER): Payer: 59 | Admitting: Student

## 2022-12-14 ENCOUNTER — Encounter (HOSPITAL_BASED_OUTPATIENT_CLINIC_OR_DEPARTMENT_OTHER): Payer: Self-pay

## 2022-12-14 DIAGNOSIS — M25652 Stiffness of left hip, not elsewhere classified: Secondary | ICD-10-CM | POA: Diagnosis present

## 2022-12-14 DIAGNOSIS — R2689 Other abnormalities of gait and mobility: Secondary | ICD-10-CM | POA: Insufficient documentation

## 2022-12-14 DIAGNOSIS — M25552 Pain in left hip: Secondary | ICD-10-CM | POA: Diagnosis present

## 2022-12-14 DIAGNOSIS — M87052 Idiopathic aseptic necrosis of left femur: Secondary | ICD-10-CM | POA: Insufficient documentation

## 2022-12-14 NOTE — Therapy (Signed)
OUTPATIENT PHYSICAL THERAPY LOWER EXTREMITY EVALUATION   Patient Name: Barry Nichols MRN: [id number] DOB:10/17/1975, 47 y.o., male Today's Date: 12/15/2022  END OF SESSION:  PT End of Session - 12/15/22 0750     Visit Number 1    Number of Visits 24    Date for PT Re-Evaluation 03/09/23    PT Start Time 1315    PT Stop Time 1356    PT Time Calculation (min) 41 min    Activity Tolerance Patient tolerated treatment well    Behavior During Therapy WFL for tasks assessed/performed             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, Bilateral   Migraine    "when I was young" (08/25/2016"   Posterior equinus, acquired    Bilateral   Pre-diabetes    Past Surgical History:  Procedure Laterality Date   AV FISTULA PLACEMENT Left 10/15/2016   Procedure: ARTERIOVENOUS (AV) FISTULA CREATION-LEFT ARM;  Surgeon: Fransisco Hertz, MD;  Location: [facility] OR;  Service: Vascular;  Laterality: Left;   DECOMPRESSION HIP-CORE Left 12/07/2022   Procedure: LEFT HIP CORE DECOMPRESSION WITH ILIAC CREST BONE MARROW ASPIRATE;  Surgeon: Huel Cote, MD;  Location: [facility] OR;  Service: Orthopedics;  Laterality: Left;   INSERTION OF DIALYSIS CATHETER Right 10/15/2016   Procedure: INSERTION OF DIALYSIS CATHETER;  Surgeon: Fransisco Hertz, MD;  Location: [facility] OR;  Service: Vascular;  Laterality: Right;   KIDNEY TRANSPLANT  06/15/2020   [facility] TOOTH EXTRACTION     Patient Active Problem List   Diagnosis Date Noted   Avascular necrosis of bone of left hip (HCC) 12/07/2022   Hypoglycemia 08/25/2016   Pain in  joint, ankle and foot 08/19/2012   Hypertension    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  He had physical therapy with no significant improvement in pain.  He had an MRI of his hip which showed avascular necrosis.  His pain has become very limited to activities over the past few months.  He had a core decompression performed on 12/07/2022. At this time his pain is well controlled. He has been compliant with crutch use.   PERTINENT HISTORY: Left hip avascular necrosis, CHF, DM2, stage IV CKD, history of gout, metatarsal deformity,  PAIN:  Are you having pain? Yes: NPRS scale: 2/10  Pain location: aching  Pain description: aching  Aggravating factors: bumping it  Relieving factors: rest   PRECAUTIONS: Other: TDWB core decompression   RED FLAGS: None   WEIGHT BEARING RESTRICTIONS: Yes =TDWB   FALLS:  Has patient fallen in last 6 months? No  LIVING ENVIRONMENT: 2 step into the house     OCCUPATION:  Not working   Hobbies:  Multimedia programmer, going to the gym    PLOF: Independent  PATIENT GOALS:   To return to normal function/ Avoiding a limp    NEXT MD VISIT:   OBJECTIVE:   DIAGNOSTIC FINDINGS:  Nothing post op   PATIENT SURVEYS:  FOTO    COGNITION: Overall cognitive status: Within functional limits for tasks assessed     SENSATION: WFL    POSTURE: No  Significant postural limitations  PALPATION: No unexpended tenderness to palpation   LOWER EXTREMITY ROM:  PROM Right eval Left eval  Hip flexion  No significant pain not pushed to end range   Hip extension    Hip abduction    Hip adduction    Hip internal rotation  5 degrees no significant pain   Hip external rotation  20 degrees not pushed to end range. No significant pain  Knee flexion    Knee extension    Ankle dorsiflexion    Ankle plantarflexion    Ankle inversion    Ankle eversion     (Blank rows = not tested)  LOWER EXTREMITY MMT:  MMT Right eval Left eval  Hip flexion    Hip extension    Hip abduction    Hip adduction    Hip internal rotation    Hip external rotation    Knee flexion    Knee extension    Ankle dorsiflexion    Ankle plantarflexion    Ankle inversion    Ankle eversion     (Blank rows = not tested)      GAIT: Complaint with HEP   TODAY'S TREATMENT:                                                                                                                              DATE: Access Code: [email or web address] URL: [email or web address] Date: 12/15/2022 Prepared by: Lorayne Bender  Exercises - Supine Quad Set  - 1 x daily - 7 x weekly - 3 sets - 10 reps - Supine Gluteal Sets  - 1 x daily - 7 x weekly - 3 sets - 10 reps - Seated Ankle Pumps  - 1 x daily - 7 x weekly - 3 sets - 10 reps   PROM into available range    PATIENT EDUCATION:  Education details: HEP, symptom management, importance of WB  Person educated: Patient Education method: Explanation Education comprehension: verbalized understanding, returned demonstration, verbal cues required, tactile cues required, and needs further education  HOME EXERCISE PROGRAM: Access Code: [email or web address] URL: [email or web address] Date: 12/15/2022 Prepared by: Lorayne Bender  Exercises - Supine Quad Set  - 1 x daily -  7 x weekly - 3 sets - 10 reps - Supine Gluteal Sets   - 1 x daily - 7 x weekly - 3 sets - 10 reps - Seated Ankle Pumps  - 1 x daily - 7 x weekly - 3 sets - 10 reps  ASSESSMENT:  CLINICAL IMPRESSION: Patient is a 47 year old male status post: Decompression on the left hip on 12/07/2022.  He presents with expected limitations in motion, strength, and functional mobility.  He is currently touchdown weightbearing and using crutches.  Therapy perform dressing change on the patient.  At this time his motion is doing well.  His pain is well-controlled.  He would benefit from skilled therapy to improve ability to ambulate and perform daily tasks without pain. OBJECTIVE IMPAIRMENTS: Abnormal gait, decreased activity tolerance, decreased balance, difficulty walking, decreased ROM, decreased strength, and pain.   ACTIVITY LIMITATIONS: carrying, lifting, bending, sitting, standing, squatting, stairs, transfers, bed mobility, dressing, and locomotion level  PARTICIPATION LIMITATIONS: meal prep, cleaning, driving, shopping, community activity, occupation, and yard work  PERSONAL FACTORS: 1-2 comorbidities: CHF, CKD  are also affecting patient's functional outcome.   REHAB POTENTIAL: Good  CLINICAL DECISION MAKING: Stable/uncomplicated  EVALUATION COMPLEXITY: Low   GOALS: Goals reviewed with patient? Yes  SHORT TERM GOALS: Target date: 01/26/2023   Patient will demonstrate full left hip passive range of motion Baseline: Goal status: INITIAL  2.  Patient will demonstrate 80% of right hip strength for major muscle groups Baseline:  Goal status: INITIAL  3.  Patient will progress off crutches as tolerated. Baseline:  Goal status: INITIAL LONG TERM GOALS: Target date: 03/09/2023    Patient will ambulate community distances without antalgic gait Baseline:  Goal status: INITIAL  2.  Patient will go up and down 8 steps with reciprocal gait pattern without pain Baseline:  Goal status: INITIAL  3.  Patient will return to exercise  program Baseline:  Goal status: INITIAL    PLAN:  PT FREQUENCY: 2x/week  PT DURATION: 8 weeks  PLANNED INTERVENTIONS:  Therapeutic exercises, Therapeutic activity, Neuromuscular re-education, Balance training, Gait training, Patient/Family education, Self Care, Joint mobilization, Stair training, DME instructions, Aquatic Therapy, Dry Needling, Electrical stimulation, Cryotherapy, Moist heat, Taping, Manual therapy, and Re-evaluation.   PLAN FOR NEXT SESSION: Begin passive range of motion in a tolerated ranges.  Consider soft tissue mobilization as needed.  Consider quad strengthening such as short arc quads, long arc quads.  Consider glutes strengthening.  Consider clamshells next visit.  Next visit prep patient for weightbearing.  Reviewed use of 1 crutch.  Review standing weight shifts.   Dessie Coma, PT 12/15/2022, 7:52 AM

## 2022-12-23 ENCOUNTER — Ambulatory Visit (HOSPITAL_BASED_OUTPATIENT_CLINIC_OR_DEPARTMENT_OTHER): Payer: 59 | Attending: Orthopaedic Surgery | Admitting: Physical Therapy

## 2022-12-23 ENCOUNTER — Ambulatory Visit (HOSPITAL_BASED_OUTPATIENT_CLINIC_OR_DEPARTMENT_OTHER): Payer: 59 | Admitting: Orthopaedic Surgery

## 2022-12-23 ENCOUNTER — Encounter (HOSPITAL_BASED_OUTPATIENT_CLINIC_OR_DEPARTMENT_OTHER): Payer: Self-pay | Admitting: Physical Therapy

## 2022-12-23 DIAGNOSIS — R2689 Other abnormalities of gait and mobility: Secondary | ICD-10-CM | POA: Diagnosis present

## 2022-12-23 DIAGNOSIS — M25552 Pain in left hip: Secondary | ICD-10-CM | POA: Insufficient documentation

## 2022-12-23 DIAGNOSIS — M25652 Stiffness of left hip, not elsewhere classified: Secondary | ICD-10-CM | POA: Insufficient documentation

## 2022-12-23 DIAGNOSIS — M87052 Idiopathic aseptic necrosis of left femur: Secondary | ICD-10-CM

## 2022-12-23 NOTE — Progress Notes (Signed)
Chief Complaint: Left hip pain     History of Present Illness:   During Status post left hip core decompression 8/19   12/23/2022: Presents today 2 weeks status post left hip core decompression.  Overall he is doing well.  He has no pain at today's visit.  He has been compliant with nonweightbearing status.   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, Bilateral   Migraine    "when I was young" (08/25/2016"   Posterior equinus, acquired    Bilateral   Pre-diabetes    Past Surgical History:  Procedure Laterality Date   AV FISTULA PLACEMENT Left 10/15/2016   Procedure: ARTERIOVENOUS (AV) FISTULA CREATION-LEFT ARM;  Surgeon: Fransisco Hertz, MD;  [facility] OR;  Service: Vascular;  Laterality: Left;   DECOMPRESSION HIP-CORE Left 12/07/2022   Procedure: LEFT HIP CORE DECOMPRESSION WITH ILIAC CREST BONE MARROW ASPIRATE;  Surgeon: Huel Cote, MD;  Location: [facility] OR;  Service: Orthopedics;  Laterality: Left;   INSERTION OF DIALYSIS CATHETER Right 10/15/2016   Procedure: INSERTION OF DIALYSIS CATHETER;  Surgeon: Fransisco Hertz, MD;  Location: [facility] OR;  Service: Vascular;  Laterality: Right;   KIDNEY TRANSPLANT  06/15/2020   [facility]   WISDOM TOOTH EXTRACTION     Social History   Socioeconomic History   Marital status: Married    Spouse name: Not on file   Number of children: 1   Years of education: BA   Highest education level: Not on file  Occupational History   Not on file   Tobacco Use   Smoking status: Never   Smokeless tobacco: Never  Vaping Use   Vaping status: Never Used  Substance and Sexual Activity   Alcohol use: No    Comment: rare   Drug use: No    Comment: formerly used Marijuana   Sexual activity: Not on file  Other Topics Concern   Not on file  Social History Narrative   Drinks tea 3-5 times a week    Social Determinants of Health   Financial Resource Strain: Not on file  Food Insecurity: Not on file  Transportation Needs: Not on file  Physical Activity: Not on file  Stress: Not on file  Social Connections: Not on file   Family History  Problem Relation Age of Onset   Hypertension Father        Also maternal grandmother   Diabetes Father        also maternal grandmother   CVA Father    Cancer Maternal Grandfather        lung   Heart attack Maternal Grandfather    No Known Allergies  Current Outpatient Medications  Medication Sig Dispense Refill   amLODipine (NORVASC) 10 MG tablet Take 1  tablet (10 mg total) by mouth at bedtime. 30 tablet 0   aspirin EC 325 MG tablet Take 1 tablet (325 mg total) by mouth daily. (Patient not taking: Reported on 11/19/2022) 30 tablet 0   carvedilol (COREG) 25 MG tablet Take 1 tablet (25 mg total) by mouth 2 (two) times daily with a meal. 60 tablet 0   cinacalcet (SENSIPAR) 30 MG tablet Take 30 mg by mouth daily. (Patient not taking: Reported on 11/27/2022)     cloNIDine (CATAPRES) 0.3 MG tablet Take 1 tablet (0.3 mg total) by mouth 3 (three) times daily. (Patient taking differently: Take 0.3 mg by mouth 2 (two) times daily.) 90 tablet 0   fenofibrate (TRICOR) 145 MG tablet Take 145 mg by mouth daily.     icosapent Ethyl (VASCEPA) 1 g capsule Take 2 g by mouth 2 (two) times daily.     insulin degludec (TRESIBA) 200 UNIT/ML FlexTouch Pen Inject 30 Units into the skin at bedtime.     insulin lispro (HUMALOG KWIKPEN) 200 UNIT/ML KwikPen Inject 25-40 Units into the skin 3 (three) times daily as needed  (high blood sugar).     MOUNJARO 15 MG/0.5ML Pen Inject 15 mg into the skin once a week.     mycophenolate (CELLCEPT) 500 MG tablet Take 1,000 mg by mouth 2 (two) times daily.     oxyCODONE (ROXICODONE) 5 MG immediate release tablet Take 1 tablet (5 mg total) by mouth every 4 (four) hours as needed for severe pain or breakthrough pain. 10 tablet 0   predniSONE (DELTASONE) 5 MG tablet Take 5 mg by mouth daily.     sulfamethoxazole-trimethoprim (BACTRIM) 400-80 MG tablet Take 1 tablet by mouth every Monday, Wednesday, and Friday.     tacrolimus (PROGRAF) 1 MG capsule Take 3 mg by mouth 2 (two) times daily.     tadalafil (CIALIS) 20 MG tablet Take 20 mg by mouth daily as needed for erectile dysfunction.     traZODone (DESYREL) 50 MG tablet Take 50 mg by mouth at bedtime.     No current facility-administered medications for this visit.   No results found.  Review of Systems:   A ROS was performed including pertinent positives and negatives as documented in the HPI.  Physical Exam :   Constitutional: NAD and appears stated age Neurological: Alert and oriented Psych: Appropriate affect and cooperative There were no vitals taken for this visit.   Comprehensive Musculoskeletal Exam:    Inspection Right Left  Skin No atrophy or gross abnormalities appreciated No atrophy or gross abnormalities appreciated  Palpation    Tenderness None Greater trochanter.    Crepitus None None  Range of Motion    Flexion (passive) 120 120  Extension 30 30  IR 30 30  ER 45 45  Strength    Flexion  5/5 5/5  Extension 5/5 5/5  Special Tests    FABER Negative Negative  FADIR Negative Negative  ER Lag/Capsular Insufficiency Negative Negative  Instability Negative Negative  Sacroiliac pain Negative  Negative   Instability    Generalized Laxity No No  Neurologic    sciatic, femoral, obturator nerves intact to light sensation  Vascular/Lymphatic    DP pulse 2+ 2+  Lumbar Exam    Patient has  symmetric lumbar range of motion with negative pain referral to hip  Positive Trendelenburg Imaging:    I personally reviewed and interpreted the radiographs.   Assessment:   47 y.o. male presents 2 weeks status post left hip  core decompression overall doing extremely well.  At this time he will plan to advance his weightbearing to full over the course the next 2 weeks.  He may discontinue his crutches.  I will plan to see him back in 4 weeks for reassessment.   -Return to clinic in 4 weeks for reassessment         I personally saw and evaluated the patient, and participated in the management and treatment plan.  Huel Cote, MD Attending Physician, Orthopedic Surgery  This document was dictated using Dragon voice recognition software. A reasonable attempt at proof reading has been made to minimize errors.

## 2022-12-23 NOTE — Therapy (Signed)
OUTPATIENT PHYSICAL THERAPY LOWER EXTREMITY EVALUATION   Patient Name: Barry Nichols MRN: [id number] DOB:Feb 24, 1976, 47 y.o., male Today's Date: 12/24/2022  END OF SESSION:  PT End of Session - 12/23/2022    Visit Number 2   Number of Visits 24   Date for PT Re-Evaluation 03/09/2023   Authorization Type [employer]   PT Start Time 1109   PT Stop Time 1144   PT Time Calculation (min) 35 min   Activity Tolerance Patient tolerated treatment well; No increased pain   Behavior During Therapy WFL for tasks assessed/performed     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, Bilateral   Migraine    "when I was young" (08/25/2016"   Posterior equinus, acquired    Bilateral   Pre-diabetes    Past Surgical History:  Procedure Laterality Date   AV FISTULA PLACEMENT Left 10/15/2016   Procedure: ARTERIOVENOUS (AV) FISTULA CREATION-LEFT ARM;  Surgeon: Fransisco Hertz, MD;  Location: [facility] OR;  Service: Vascular;  Laterality: Left;   DECOMPRESSION HIP-CORE Left 12/07/2022   Procedure: LEFT HIP CORE DECOMPRESSION WITH ILIAC CREST BONE MARROW ASPIRATE;  Surgeon: Huel Cote, MD;  Location: [facility] OR;  Service: Orthopedics;  Laterality: Left;   INSERTION OF DIALYSIS CATHETER Right 10/15/2016   Procedure: INSERTION OF DIALYSIS CATHETER;  Surgeon: Fransisco Hertz, MD;  Location: [facility] OR;  Service: Vascular;  Laterality: Right;   KIDNEY TRANSPLANT  06/15/2020   [facility] TOOTH EXTRACTION     Patient Active Problem List   Diagnosis Date Noted   Avascular necrosis of bone of left hip (HCC) 12/07/2022   Hypoglycemia  08/25/2016   Pain in joint, ankle and foot 08/19/2012   Hypertension    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  Pt saw MD earlier this AM.  He had his stitches removed.  Pt reports MD told him he could resume normal activities though no high impact activities such as running.  Pt reports MD informed him he could go back to the gym and use the bike but not TM.  Pt denies any adverse effects after prior Rx.  Pt reports compliance with HEP.  Pt walked out of MD office and to the clinic today.  He states his leg feels weak though he doesn't have pain.      PERTINENT HISTORY: Left hip avascular necrosis, CHF, DM2, stage IV CKD, history  of gout, metatarsal deformity,  PAIN:  Are you having pain? Yes: NPRS scale: 1/10  Pain location: aching  Pain description: aching  Aggravating factors: bumping it  Relieving factors: rest   PRECAUTIONS: Other: TDWB core decompression   RED FLAGS: None   WEIGHT BEARING RESTRICTIONS: Yes =TDWB   FALLS:  Has patient fallen in last 6 months? No  LIVING ENVIRONMENT: 2 step into the house     OCCUPATION:  Not working   Hobbies:  Multimedia programmer, going to the gym    PLOF: Independent  PATIENT GOALS:   To return to normal function/ Avoiding a limp    NEXT MD VISIT:   OBJECTIVE:   DIAGNOSTIC FINDINGS:  Nothing post op    TODAY'S TREATMENT:                                                                                                                                 Pt received L hip PROM in flex, abd, ER, and IR w/n pt and tissue tolerance in supine.  Reviewed and performed HEP. Quad sets with 5 sec hold x 10 reps Glute sets with 5 sec hold x 10 reps SAQ 2x10 LAQ 2x10 Supine heel slide 2x10 prone knee flexion AROM 2x10 Hooklying Bent knee fall out with Tra 2x10 on L, 1x10 on R   PATIENT EDUCATION:  Education details: HEP, symptom management, POC, exercise form, and rationale of interventions.  Educated pt concerning using 1 crutch, decreasing Wb'ing, and/or resting if having increased pain and soreness with increased Wb'ing with ambulation. Person educated: Patient Education method: Explanation, demonstration, verbal cues, tactile cues, and handout Education comprehension: verbalized understanding, returned demonstration, verbal cues required, tactile cues required, and needs further education  HOME EXERCISE PROGRAM: Access Code: [email or web address] URL: [email or web address] Date: 12/15/2022 Prepared by: Lorayne Bender  Exercises - Supine Quad Set  - 1 x daily - 7 x weekly - 3 sets - 10 reps - Supine Gluteal Sets  - 1 x daily - 7 x weekly - 3 sets - 10 reps - Seated Ankle Pumps  - 1 x daily - 7 x weekly - 3 sets - 10 reps  Updated HEP: - Supine Short Arc Quad  - 2 x daily - 6-7 x weekly - 2 sets - 10 reps - Seated Long Arc Quad  - 1 x daily - 7 x weekly - 2 sets - 10 reps - Supine Heel Slide  - 1-2 x daily - 7 x weekly - 2 sets - 10 reps  ASSESSMENT:  CLINICAL IMPRESSION: Pt saw MD today and reports he was instructed he could fully WB on L LE.  Pt enters clinic ambulating with full Wb'ing holding his crutches.  He denies pain ambulating without AD.  PT gently progressed exercises today and Pt tolerated exercises well.  He performed exercises well without pain and without c/o's.  Pt also tolerated hip PROM well.  PT updated  HEP and  gave pt a HEP handout.  Pt demonstrates good understanding of HEP.  He responded well to Rx reporting no pain after Rx.  Pt should benefit from continued skilled PT to address impairments and goals and to assist in restoring desired level of function.     OBJECTIVE IMPAIRMENTS: Abnormal gait, decreased activity tolerance, decreased balance, difficulty walking, decreased ROM, decreased strength, and pain.   ACTIVITY LIMITATIONS: carrying, lifting, bending, sitting, standing, squatting, stairs, transfers, bed mobility, dressing, and locomotion level  PARTICIPATION LIMITATIONS: meal prep, cleaning, driving, shopping, community activity, occupation, and yard work  PERSONAL FACTORS: 1-2 comorbidities: CHF, CKD  are also affecting patient's functional outcome.   REHAB POTENTIAL: Good  CLINICAL DECISION MAKING: Stable/uncomplicated  EVALUATION COMPLEXITY: Low   GOALS: Goals reviewed with patient? Yes  SHORT TERM GOALS: Target date: 01/26/2023   Patient will demonstrate full left hip passive range of motion Baseline: Goal status: INITIAL  2.  Patient will demonstrate 80% of right hip strength for major muscle groups Baseline:  Goal status: INITIAL  3.  Patient will progress off crutches as tolerated. Baseline:  Goal status: INITIAL LONG TERM GOALS: Target date: 03/09/2023    Patient will ambulate community distances without antalgic gait Baseline:  Goal status: INITIAL  2.  Patient will go up and down 8 steps with reciprocal gait pattern without pain Baseline:  Goal status: INITIAL  3.  Patient will return to exercise program Baseline:  Goal status: INITIAL    PLAN:  PT FREQUENCY: 2x/week  PT DURATION: 8 weeks  PLANNED INTERVENTIONS:  Therapeutic exercises, Therapeutic activity, Neuromuscular re-education, Balance training, Gait training, Patient/Family education, Self Care, Joint mobilization, Stair training, DME instructions, Aquatic Therapy, Dry  Needling, Electrical stimulation, Cryotherapy, Moist heat, Taping, Manual therapy, and Re-evaluation.   PLAN FOR NEXT SESSION: Begin passive range of motion in tolerated ranges.  Consider soft tissue mobilization as needed.  quad strengthening such as short arc quads, long arc quads.  Consider glutes strengthening.  Consider clamshells next visit.  Review standing weight shifts.   Audie Clear III PT, DPT 12/24/22 8:30 PM

## 2022-12-31 ENCOUNTER — Encounter (HOSPITAL_BASED_OUTPATIENT_CLINIC_OR_DEPARTMENT_OTHER): Payer: Self-pay | Admitting: Physical Therapy

## 2022-12-31 ENCOUNTER — Ambulatory Visit (HOSPITAL_BASED_OUTPATIENT_CLINIC_OR_DEPARTMENT_OTHER): Payer: 59 | Admitting: Physical Therapy

## 2022-12-31 DIAGNOSIS — M25552 Pain in left hip: Secondary | ICD-10-CM

## 2022-12-31 DIAGNOSIS — R2689 Other abnormalities of gait and mobility: Secondary | ICD-10-CM

## 2022-12-31 DIAGNOSIS — M25652 Stiffness of left hip, not elsewhere classified: Secondary | ICD-10-CM

## 2022-12-31 NOTE — Therapy (Signed)
OUTPATIENT PHYSICAL THERAPY LOWER EXTREMITY EVALUATION   Patient Name: Barry Nichols MRN: [id number] DOB:1975/11/16, 47 y.o., male Today's Date: 12/31/2022  END OF SESSION:  PT End of Session - 12/31/22 0937     Visit Number 3    Number of Visits 24    Date for PT Re-Evaluation 03/09/23    Authorization Type UHC    PT Start Time 0936    PT Stop Time 1015    PT Time Calculation (min) 39 min    Activity Tolerance Patient tolerated treatment well;No increased pain    Behavior During Therapy WFL for tasks assessed/performed             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, Bilateral   Migraine    "when I was young" (08/25/2016"   Posterior equinus, acquired    Bilateral   Pre-diabetes    Past Surgical History:  Procedure Laterality Date   AV FISTULA PLACEMENT Left 10/15/2016   Procedure: ARTERIOVENOUS (AV) FISTULA CREATION-LEFT ARM;  Surgeon: Fransisco Hertz, MD;  Location: [facility] OR;  Service: Vascular;  Laterality: Left;   DECOMPRESSION HIP-CORE Left 12/07/2022   Procedure: LEFT HIP CORE DECOMPRESSION WITH ILIAC CREST BONE MARROW ASPIRATE;  Surgeon: Huel Cote, MD;  Location: [facility] OR;  Service: Orthopedics;  Laterality: Left;   INSERTION OF DIALYSIS CATHETER Right 10/15/2016   Procedure: INSERTION OF DIALYSIS CATHETER;  Surgeon: Fransisco Hertz, MD;  Location: [facility] OR;  Service: Vascular;  Laterality: Right;   KIDNEY TRANSPLANT  06/15/2020   [facility] TOOTH EXTRACTION     Patient Active Problem List   Diagnosis Date Noted   Avascular necrosis of bone of left hip (HCC)  12/07/2022   Hypoglycemia 08/25/2016   Pain in joint, ankle and foot 08/19/2012   Hypertension    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   ESRD on dialysis [facility]) 09/11/2011   Anemia 09/11/2011   Non-compliant patient 09/11/2011    [id number] Real Cons PA  REFERRING PROVIDER: Huel Cote MD   REFERRING DIAG:  Diagnosis  M87.052 (ICD-10-CM) - Avascular necrosis of bone of left hip (HCC)    THERAPY DIAG:  Pain in left hip  Stiffness of left hip, not elsewhere classified  Other abnormalities of gait and mobility  Rationale for Evaluation and Treatment: Rehabilitation  ONSET DATE: Left hip core decompression 12/07/2022  SUBJECTIVE:   SUBJECTIVE STATEMENT: Having more trouble with functional things than anything.    PERTINENT HISTORY: Left hip avascular necrosis, CHF, DM2, stage IV CKD, history of gout, metatarsal deformity,  PAIN:  Are you having pain? Yes: NPRS scale: 0/10  Pain location: aching  Pain description: aching  Aggravating factors: bumping it  Relieving factors: rest   PRECAUTIONS: Other: TDWB core decompression   RED FLAGS: None   WEIGHT BEARING RESTRICTIONS: Yes =TDWB   FALLS:  Has patient fallen in last 6 months? No  LIVING ENVIRONMENT: 2 step into the house     OCCUPATION:  Not working  Hobbies:  Golfing, going to the gym    PLOF: Independent  PATIENT GOALS:   To return to normal function/ Avoiding a limp    NEXT MD VISIT:   OBJECTIVE:   DIAGNOSTIC FINDINGS:  Nothing post op    TODAY'S TREATMENT:                                                                                                                              12/31/22 Bridge 2 x 10  Step up 4 inch 2 x 10 Lateral step down 4 inch 3 x 10 STS with LLE bias 3x 10 Single leg balance 3 way vector 5 x 5 second holds Shuttle leg press 125 resistance 2 x 10  Lateral stepping 5 x 10 feet bilateral  Prone hip extension 2 x 15 bilateral   12/23/22 Pt received  L hip PROM in flex, abd, ER, and IR w/n pt and tissue tolerance in supine.  Reviewed and performed HEP. Quad sets with 5 sec hold x 10 reps Glute sets with 5 sec hold x 10 reps SAQ 2x10 LAQ 2x10 Supine heel slide 2x10 prone knee flexion AROM 2x10 Hooklying Bent knee fall out with Tra 2x10 on L, 1x10 on R   PATIENT EDUCATION:  Education details: HEP, symptom management, POC, exercise form, and rationale of interventions.  Educated pt concerning using 1 crutch, decreasing Wb'ing, and/or resting if having increased pain and soreness with increased Wb'ing with ambulation. Person educated: Patient Education method: Explanation, demonstration, verbal cues, tactile cues, and handout Education comprehension: verbalized understanding, returned demonstration, verbal cues required, tactile cues required, and needs further education  HOME EXERCISE PROGRAM: Access Code: [email or web address] URL: [email or web address] Date: 12/15/2022 Prepared by: Lorayne Bender  Exercises - Supine Quad Set  - 1 x daily - 7 x weekly - 3 sets - 10 reps - Supine Gluteal Sets  - 1 x daily - 7 x weekly - 3 sets - 10 reps - Seated Ankle Pumps  - 1 x daily - 7 x weekly - 3 sets - 10 reps  Updated HEP: - Supine Short Arc Quad  - 2 x daily - 6-7 x weekly - 2 sets - 10 reps - Seated Long Arc Quad  - 1 x daily - 7 x weekly - 2 sets - 10 reps - Supine Heel Slide  - 1-2 x daily - 7 x weekly - 2 sets - 10 reps  12/31/22 - Step Up  - 1 x daily - 7 x weekly - 3 sets - 10 reps - Lateral Step Down  - 1 x daily - 7 x weekly - 3 sets - 10 reps - Sit to Stand with Arms Crossed  - 1 x daily - 7 x weekly - 3 sets - 10 reps - Single Leg Balance with Clock Reach  - 1 x daily - 7 x weekly - 2 sets - 5 reps - 5 second hold  ASSESSMENT:  CLINICAL  IMPRESSION: Patient performing table exercises with ease and greatest limitation is functional strength. Performed functional strengthening which is tolerated well with some deficit in  glute weakness. Patient overall progressing very well and is very motivated to improve function.Performs all exercises today with good mechanics. Patient will continue to benefit from physical therapy in order to improve function and reduce impairment.     OBJECTIVE IMPAIRMENTS: Abnormal gait, decreased activity tolerance, decreased balance, difficulty walking, decreased ROM, decreased strength, and pain.   ACTIVITY LIMITATIONS: carrying, lifting, bending, sitting, standing, squatting, stairs, transfers, bed mobility, dressing, and locomotion level  PARTICIPATION LIMITATIONS: meal prep, cleaning, driving, shopping, community activity, occupation, and yard work  PERSONAL FACTORS: 1-2 comorbidities: CHF, CKD  are also affecting patient's functional outcome.   REHAB POTENTIAL: Good  CLINICAL DECISION MAKING: Stable/uncomplicated  EVALUATION COMPLEXITY: Low   GOALS: Goals reviewed with patient? Yes  SHORT TERM GOALS: Target date: 01/26/2023   Patient will demonstrate full left hip passive range of motion Baseline: Goal status: INITIAL  2.  Patient will demonstrate 80% of right hip strength for major muscle groups Baseline:  Goal status: INITIAL  3.  Patient will progress off crutches as tolerated. Baseline:  Goal status: INITIAL LONG TERM GOALS: Target date: 03/09/2023    Patient will ambulate community distances without antalgic gait Baseline:  Goal status: INITIAL  2.  Patient will go up and down 8 steps with reciprocal gait pattern without pain Baseline:  Goal status: INITIAL  3.  Patient will return to exercise program Baseline:  Goal status: INITIAL    PLAN:  PT FREQUENCY: 2x/week  PT DURATION: 8 weeks  PLANNED INTERVENTIONS:  Therapeutic exercises, Therapeutic activity, Neuromuscular re-education, Balance training, Gait training, Patient/Family education, Self Care, Joint mobilization, Stair training, DME instructions, Aquatic Therapy, Dry Needling,  Electrical stimulation, Cryotherapy, Moist heat, Taping, Manual therapy, and Re-evaluation.   PLAN FOR NEXT SESSION: Begin passive range of motion in tolerated ranges.  Consider soft tissue mobilization as needed.  Consider glutes strengthening.      Reola Mosher Tc [place], PT 12/31/2022, 9:38 AM

## 2023-01-07 ENCOUNTER — Ambulatory Visit (HOSPITAL_BASED_OUTPATIENT_CLINIC_OR_DEPARTMENT_OTHER): Payer: 59 | Admitting: Physical Therapy

## 2023-01-07 ENCOUNTER — Encounter (HOSPITAL_BASED_OUTPATIENT_CLINIC_OR_DEPARTMENT_OTHER): Payer: Self-pay | Admitting: Physical Therapy

## 2023-01-07 DIAGNOSIS — M25552 Pain in left hip: Secondary | ICD-10-CM

## 2023-01-07 DIAGNOSIS — R2689 Other abnormalities of gait and mobility: Secondary | ICD-10-CM

## 2023-01-07 DIAGNOSIS — M25652 Stiffness of left hip, not elsewhere classified: Secondary | ICD-10-CM

## 2023-01-07 NOTE — Therapy (Signed)
OUTPATIENT PHYSICAL THERAPY TREATMENT   Patient Name: Barry Nichols MRN: [id number] DOB:06-Feb-1976, 47 y.o., male Today's Date: 01/07/2023  END OF SESSION:  PT End of Session - 01/07/23 1013     Visit Number 4    Number of Visits 24    Date for PT Re-Evaluation 03/09/23    Authorization Type [employer]    PT Start Time 1015    PT Stop Time 1055    PT Time Calculation (min) 40 min    Activity Tolerance Patient tolerated treatment well;No increased pain    Behavior During Therapy WFL for tasks assessed/performed             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, Bilateral   Migraine    "when I was young" (08/25/2016"   Posterior equinus, acquired    Bilateral   Pre-diabetes    Past Surgical History:  Procedure Laterality Date   AV FISTULA PLACEMENT Left 10/15/2016   Procedure: ARTERIOVENOUS (AV) FISTULA CREATION-LEFT ARM;  Surgeon: Fransisco Hertz, MD;  Location: [facility] OR;  Service: Vascular;  Laterality: Left;   DECOMPRESSION HIP-CORE Left 12/07/2022   Procedure: LEFT HIP CORE DECOMPRESSION WITH ILIAC CREST BONE MARROW ASPIRATE;  Surgeon: Huel Cote, MD;  Location: [facility] OR;  Service: Orthopedics;  Laterality: Left;   INSERTION OF DIALYSIS CATHETER Right 10/15/2016   Procedure: INSERTION OF DIALYSIS CATHETER;  Surgeon: Fransisco Hertz, MD;  Location: [facility] OR;  Service: Vascular;  Laterality: Right;   KIDNEY TRANSPLANT  06/15/2020   [facility] TOOTH EXTRACTION     Patient Active Problem List   Diagnosis Date Noted   Avascular necrosis of bone of left hip (HCC) 12/07/2022    Hypoglycemia 08/25/2016   Pain in joint, ankle and foot 08/19/2012   Hypertension    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   ESRD on dialysis [facility]) 09/11/2011   Anemia 09/11/2011   Non-compliant patient 09/11/2011    YQM:VHQION Real Cons PA  REFERRING PROVIDER: Huel Cote MD   REFERRING DIAG:  Diagnosis  M87.052 (ICD-10-CM) - Avascular necrosis of bone of left hip (HCC)    THERAPY DIAG:  Pain in left hip  Stiffness of left hip, not elsewhere classified  Other abnormalities of gait and mobility  Rationale for Evaluation and Treatment: Rehabilitation  ONSET DATE: Left hip core decompression 12/07/2022  SUBJECTIVE:   SUBJECTIVE STATEMENT: Patient states exercises are getting the best of him, tires out the hip. Exercises are challenging but going well.     PERTINENT HISTORY: Left hip avascular necrosis, CHF, DM2, stage IV CKD, history of gout, metatarsal deformity,  PAIN:  Are you having pain? Yes: NPRS scale: 0/10  Pain location: aching  Pain description: aching  Aggravating factors: bumping it  Relieving factors: rest   PRECAUTIONS: Other: TDWB core decompression   RED FLAGS: None   WEIGHT BEARING RESTRICTIONS: Yes =TDWB   FALLS:  Has patient fallen in last 6 months? No  LIVING ENVIRONMENT: 2 step into the  house     OCCUPATION:  Not working   Hobbies:  Multimedia programmer, going to the gym    PLOF: Independent  PATIENT GOALS:   To return to normal function/ Avoiding a limp    NEXT MD VISIT:   OBJECTIVE:   DIAGNOSTIC FINDINGS:  Nothing post op    TODAY'S TREATMENT:                                                                                                                              01/07/23 Step up 6 inch 2 x 10 Lateral step down 6 inch 3 x 5 Lunge 2 x 10 bilateral  Shuttle leg press 125 resistance 1 x 15 DL, 75 2 x 15 SL Lateral stepping GTB 6 x 10 feet bilateral  Single leg mini squat with 3 way slide out 2 x 5 bilateral    12/31/22 Bridge 2 x 10  Step up 4 inch 2 x 10 Lateral step down 4 inch 3 x 10 STS with LLE bias 3x 10 Single leg balance 3 way vector 5 x 5 second holds Shuttle leg press 125 resistance 2 x 10  Lateral stepping 5 x 10 feet bilateral  Prone hip extension 2 x 15 bilateral   12/23/22 Pt received L hip PROM in flex, abd, ER, and IR w/n pt and tissue tolerance in supine.  Reviewed and performed HEP. Quad sets with 5 sec hold x 10 reps Glute sets with 5 sec hold x 10 reps SAQ 2x10 LAQ 2x10 Supine heel slide 2x10 prone knee flexion AROM 2x10 Hooklying Bent knee fall out with Tra 2x10 on L, 1x10 on R   PATIENT EDUCATION:  Education details: HEP, symptom management, POC, exercise form, and rationale of interventions.  Educated pt concerning using 1 crutch, decreasing Wb'ing, and/or resting if having increased pain and soreness with increased Wb'ing with ambulation. Person educated: Patient Education method: Explanation, demonstration, verbal cues, tactile cues, and handout Education comprehension: verbalized understanding, returned demonstration, verbal cues required, tactile cues required, and needs further education  HOME EXERCISE PROGRAM: Access Code: [email or web address] URL: [email or web address] Date: 12/15/2022 Prepared by: Lorayne Bender  Exercises - Supine Quad Set  - 1 x daily - 7 x weekly - 3 sets - 10 reps - Supine Gluteal Sets  - 1 x daily - 7 x weekly - 3 sets - 10 reps - Seated Ankle Pumps  - 1 x daily - 7 x weekly - 3 sets - 10 reps  Updated HEP: - Supine Short Arc Quad  - 2 x daily - 6-7 x weekly - 2 sets - 10 reps - Seated Long Arc Quad  - 1 x daily - 7 x weekly - 2 sets - 10 reps - Supine Heel Slide  - 1-2 x daily - 7 x weekly - 2 sets - 10 reps  12/31/22 - Step Up  - 1 x daily - 7 x weekly - 3 sets - 10 reps -  Lateral Step Down  - 1 x daily - 7 x weekly - 3 sets - 10 reps - Sit to Stand with Arms Crossed  - 1 x daily - 7 x weekly - 3 sets - 10 reps - Single  Leg Balance with Clock Reach  - 1 x daily - 7 x weekly - 2 sets - 5 reps - 5 second hold  01/07/23 - Single Leg Balance with Four Way Reach and Rotation  - 1 x daily - 7 x weekly - 3 sets - 10 reps  ASSESSMENT:  CLINICAL IMPRESSION: Patient progressing well overall but does demonstrate impaired L glue and quad strength during today's session. Patient tolerates additional exercises and progressions well and is provided with intermittent cueing with good carry over. Noted LLE fatigue at end of session. Patient will continue to benefit from physical therapy in order to improve function and reduce impairment.     OBJECTIVE IMPAIRMENTS: Abnormal gait, decreased activity tolerance, decreased balance, difficulty walking, decreased ROM, decreased strength, and pain.   ACTIVITY LIMITATIONS: carrying, lifting, bending, sitting, standing, squatting, stairs, transfers, bed mobility, dressing, and locomotion level  PARTICIPATION LIMITATIONS: meal prep, cleaning, driving, shopping, community activity, occupation, and yard work  PERSONAL FACTORS: 1-2 comorbidities: CHF, CKD  are also affecting patient's functional outcome.   REHAB POTENTIAL: Good  CLINICAL DECISION MAKING: Stable/uncomplicated  EVALUATION COMPLEXITY: Low   GOALS: Goals reviewed with patient? Yes  SHORT TERM GOALS: Target date: 01/26/2023   Patient will demonstrate full left hip passive range of motion Baseline: Goal status: INITIAL  2.  Patient will demonstrate 80% of right hip strength for major muscle groups Baseline:  Goal status: INITIAL  3.  Patient will progress off crutches as tolerated. Baseline:  Goal status: INITIAL LONG TERM GOALS: Target date: 03/09/2023    Patient will ambulate community distances without antalgic gait Baseline:  Goal status: INITIAL  2.  Patient will go up and down 8 steps with reciprocal gait pattern without pain Baseline:  Goal status: INITIAL  3.  Patient will return to exercise  program Baseline:  Goal status: INITIAL    PLAN:  PT FREQUENCY: 2x/week  PT DURATION: 8 weeks  PLANNED INTERVENTIONS:  Therapeutic exercises, Therapeutic activity, Neuromuscular re-education, Balance training, Gait training, Patient/Family education, Self Care, Joint mobilization, Stair training, DME instructions, Aquatic Therapy, Dry Needling, Electrical stimulation, Cryotherapy, Moist heat, Taping, Manual therapy, and Re-evaluation.   PLAN FOR NEXT SESSION:   Consider soft tissue mobilization as needed.  Consider glutes strengthening.      Reola Mosher Inioluwa Baris, PT 01/07/2023, 10:13 AM

## 2023-01-13 ENCOUNTER — Ambulatory Visit (HOSPITAL_BASED_OUTPATIENT_CLINIC_OR_DEPARTMENT_OTHER): Payer: 59 | Admitting: Orthopaedic Surgery

## 2023-01-13 ENCOUNTER — Ambulatory Visit (HOSPITAL_BASED_OUTPATIENT_CLINIC_OR_DEPARTMENT_OTHER): Payer: 59 | Admitting: Physical Therapy

## 2023-01-13 ENCOUNTER — Encounter (HOSPITAL_BASED_OUTPATIENT_CLINIC_OR_DEPARTMENT_OTHER): Payer: Self-pay | Admitting: Physical Therapy

## 2023-01-13 DIAGNOSIS — M25652 Stiffness of left hip, not elsewhere classified: Secondary | ICD-10-CM

## 2023-01-13 DIAGNOSIS — M87052 Idiopathic aseptic necrosis of left femur: Secondary | ICD-10-CM

## 2023-01-13 DIAGNOSIS — M25552 Pain in left hip: Secondary | ICD-10-CM

## 2023-01-13 DIAGNOSIS — R2689 Other abnormalities of gait and mobility: Secondary | ICD-10-CM

## 2023-01-13 NOTE — Progress Notes (Signed)
Chief Complaint: Left hip pain     History of Present Illness:   Status post left hip core decompression 8/19   01/13/2023: Presents today 6 weeks status post left hip core decompression.  Overall he is doing well.  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, Bilateral   Migraine    "when I was young" (08/25/2016"   Posterior equinus, acquired    Bilateral   Pre-diabetes    Past Surgical History:  Procedure Laterality Date   AV FISTULA PLACEMENT Left 10/15/2016   Procedure: ARTERIOVENOUS (AV) FISTULA CREATION-LEFT ARM;  Surgeon: Fransisco Hertz, MD;  Location: [facility] OR;  Service: Vascular;  Laterality: Left;   DECOMPRESSION HIP-CORE Left 12/07/2022   Procedure: LEFT HIP CORE DECOMPRESSION WITH ILIAC CREST BONE MARROW ASPIRATE;  Surgeon: Huel Cote, MD;  Location: [facility] OR;  Service: Orthopedics;  Laterality: Left;   INSERTION OF DIALYSIS CATHETER Right 10/15/2016   Procedure: INSERTION OF DIALYSIS CATHETER;  Surgeon: Fransisco Hertz, MD;  Location: [facility] OR;  Service: Vascular;  Laterality: Right;   KIDNEY TRANSPLANT  06/15/2020   [facility]   WISDOM TOOTH EXTRACTION     Social History   Socioeconomic History   Marital status: Married    Spouse name: Not on file   Number of children: 1   Years of education: BA   Highest education level: Not on file   Occupational History   Not on file  Tobacco Use   Smoking status: Never   Smokeless tobacco: Never  Vaping Use   Vaping status: Never Used  Substance and Sexual Activity   Alcohol use: No    Comment: rare   Drug use: No    Comment: formerly used Marijuana   Sexual activity: Not on file  Other Topics Concern   Not on file  Social History Narrative   Drinks tea 3-5 times a week    Social Determinants of Health   Financial Resource Strain: Not on file  Food Insecurity: Not on file  Transportation Needs: Not on file  Physical Activity: Not on file  Stress: Not on file  Social Connections: Not on file   Family History  Problem Relation Age of Onset   Hypertension Father        Also maternal grandmother   Diabetes Father        also maternal grandmother   CVA Father    Cancer Maternal Grandfather        lung   Heart attack Maternal Grandfather    No Known Allergies  Current Outpatient Medications  Medication Sig Dispense Refill   amLODipine (NORVASC) 10 MG  tablet Take 1 tablet (10 mg total) by mouth at bedtime. 30 tablet 0   aspirin EC 325 MG tablet Take 1 tablet (325 mg total) by mouth daily. (Patient not taking: Reported on 11/19/2022) 30 tablet 0   carvedilol (COREG) 25 MG tablet Take 1 tablet (25 mg total) by mouth 2 (two) times daily with a meal. 60 tablet 0   cinacalcet (SENSIPAR) 30 MG tablet Take 30 mg by mouth daily. (Patient not taking: Reported on 11/27/2022)     cloNIDine (CATAPRES) 0.3 MG tablet Take 1 tablet (0.3 mg total) by mouth 3 (three) times daily. (Patient taking differently: Take 0.3 mg by mouth 2 (two) times daily.) 90 tablet 0   fenofibrate (TRICOR) 145 MG tablet Take 145 mg by mouth daily.     icosapent Ethyl (VASCEPA) 1 g capsule Take 2 g by mouth 2 (two) times daily.     insulin degludec (TRESIBA) 200 UNIT/ML FlexTouch Pen Inject 30 Units into the skin at bedtime.     insulin lispro (HUMALOG KWIKPEN) 200 UNIT/ML KwikPen Inject 25-40 Units into the  skin 3 (three) times daily as needed (high blood sugar).     MOUNJARO 15 MG/0.5ML Pen Inject 15 mg into the skin once a week.     mycophenolate (CELLCEPT) 500 MG tablet Take 1,000 mg by mouth 2 (two) times daily.     oxyCODONE (ROXICODONE) 5 MG immediate release tablet Take 1 tablet (5 mg total) by mouth every 4 (four) hours as needed for severe pain or breakthrough pain. 10 tablet 0   predniSONE (DELTASONE) 5 MG tablet Take 5 mg by mouth daily.     sulfamethoxazole-trimethoprim (BACTRIM) 400-80 MG tablet Take 1 tablet by mouth every Monday, Wednesday, and Friday.     tacrolimus (PROGRAF) 1 MG capsule Take 3 mg by mouth 2 (two) times daily.     tadalafil (CIALIS) 20 MG tablet Take 20 mg by mouth daily as needed for erectile dysfunction.     traZODone (DESYREL) 50 MG tablet Take 50 mg by mouth at bedtime.     No current facility-administered medications for this visit.   No results found.  Review of Systems:   A ROS was performed including pertinent positives and negatives as documented in the HPI.  Physical Exam :   Constitutional: NAD and appears stated age Neurological: Alert and oriented Psych: Appropriate affect and cooperative There were no vitals taken for this visit.   Comprehensive Musculoskeletal Exam:    Inspection Right Left  Skin No atrophy or gross abnormalities appreciated No atrophy or gross abnormalities appreciated  Palpation    Tenderness None Greater trochanter.    Crepitus None None  Range of Motion    Flexion (passive) 120 120  Extension 30 30  IR 30 30  ER 45 45  Strength    Flexion  5/5 5/5  Extension 5/5 5/5  Special Tests    FABER Negative Negative  FADIR Negative Negative  ER Lag/Capsular Insufficiency Negative Negative  Instability Negative Negative  Sacroiliac pain Negative  Negative   Instability    Generalized Laxity No No  Neurologic    sciatic, femoral, obturator nerves intact to light sensation  Vascular/Lymphatic    DP pulse 2+ 2+   Lumbar Exam    Patient has symmetric lumbar range of motion with negative pain referral to hip  Positive Trendelenburg Imaging:    I personally reviewed and interpreted the radiographs.   Assessment:   47 y.o. male presents 6 weeks status  post left hip core decompression.  He is doing extremely well at today's visit.  He really has minimal to no pain.  I will plan to see him back for a final check in 6 months for final x-ray   -Return to clinic in 6 months for final check         I personally saw and evaluated the patient, and participated in the management and treatment plan.  Huel Cote, MD Attending Physician, Orthopedic Surgery  This document was dictated using Dragon voice recognition software. A reasonable attempt at proof reading has been made to minimize errors.

## 2023-01-13 NOTE — Therapy (Signed)
OUTPATIENT PHYSICAL THERAPY TREATMENT   Patient Name: Barry Nichols MRN: [id number] DOB:03-07-1976, 47 y.o., male Today's Date: 01/13/2023  END OF SESSION:  PT End of Session - 01/13/23 1018     Visit Number 5    Number of Visits 24    Date for PT Re-Evaluation 03/09/23    Authorization Type UHC    PT Start Time 1017    PT Stop Time 1059    PT Time Calculation (min) 42 min    Activity Tolerance Patient tolerated treatment well;No increased pain    Behavior During Therapy WFL for tasks assessed/performed             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, Bilateral   Migraine    "when I was young" (08/25/2016"   Posterior equinus, acquired    Bilateral   Pre-diabetes    Past Surgical History:  Procedure Laterality Date   AV FISTULA PLACEMENT Left 10/15/2016   Procedure: ARTERIOVENOUS (AV) FISTULA CREATION-LEFT ARM;  Surgeon: Fransisco Hertz, MD;  Location: [facility] OR;  Service: Vascular;  Laterality: Left;   DECOMPRESSION HIP-CORE Left 12/07/2022   Procedure: LEFT HIP CORE DECOMPRESSION WITH ILIAC CREST BONE MARROW ASPIRATE;  Surgeon: Huel Cote, MD;  Location: [facility] OR;  Service: Orthopedics;  Laterality: Left;   INSERTION OF DIALYSIS CATHETER Right 10/15/2016   Procedure: INSERTION OF DIALYSIS CATHETER;  Surgeon: Fransisco Hertz, MD;  Location: [facility] OR;  Service: Vascular;  Laterality: Right;   KIDNEY TRANSPLANT  06/15/2020   [facility] TOOTH EXTRACTION     Patient Active Problem List   Diagnosis Date Noted   Avascular necrosis of bone of left hip (HCC) 12/07/2022    Hypoglycemia 08/25/2016   Pain in joint, ankle and foot 08/19/2012   Hypertension    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   ESRD on dialysis [facility]) 09/11/2011   Anemia 09/11/2011   Non-compliant patient 09/11/2011    WUJ:[id number] Real Cons PA  REFERRING PROVIDER: Huel Cote MD   REFERRING DIAG:  Diagnosis  M87.052 (ICD-10-CM) - Avascular necrosis of bone of left hip (HCC)    THERAPY DIAG:  Pain in left hip  Stiffness of left hip, not elsewhere classified  Other abnormalities of gait and mobility  Rationale for Evaluation and Treatment: Rehabilitation  ONSET DATE: Left hip core decompression 12/07/2022  SUBJECTIVE:   SUBJECTIVE STATEMENT: Patient states might of over did it walking hills in neighborhood.  Patient states 50% improvement with PT intervention so far. Patient remains limited by endurance, running.     PERTINENT HISTORY: Left hip avascular necrosis, CHF, DM2, stage IV CKD, history of gout, metatarsal deformity,  PAIN:  Are you having pain? Yes: NPRS scale: 0/10  Pain location: aching  Pain description: aching  Aggravating factors: bumping it  Relieving factors: rest   PRECAUTIONS: Other: TDWB core decompression   RED FLAGS: None   WEIGHT BEARING RESTRICTIONS: Yes =TDWB   FALLS:  Has patient fallen in last 6 months?  No  LIVING ENVIRONMENT: 2 step into the house     OCCUPATION:  Not working   Hobbies:  Multimedia programmer, going to the gym    PLOF: Independent  PATIENT GOALS:   To return to normal function/ Avoiding a limp    NEXT MD VISIT:   OBJECTIVE:   DIAGNOSTIC FINDINGS:  Nothing post op    PATIENT SURVEYS:  FOTO     COGNITION: Overall cognitive status: Within functional limits for tasks assessed                         SENSATION: WFL       POSTURE: No Significant postural limitations   PALPATION: No unexpended tenderness to palpation    LOWER EXTREMITY ROM:   PROM Right eval Left eval Left 01/13/23  Hip  flexion   No significant pain not pushed to end range  [facility]  Hip extension       Hip abduction       Hip adduction       Hip internal rotation   5 degrees no significant pain  27  Hip external rotation   20 degrees not pushed to end range. No significant pain 24  Knee flexion       Knee extension       Ankle dorsiflexion       Ankle plantarflexion       Ankle inversion       Ankle eversion        (Blank rows = not tested)   LOWER EXTREMITY MMT:   MMT Right eval Left eval R 01/13/23 L 01/13/23  Hip flexion     5 4+  Hip extension     5 4+  Hip abduction     5 4+  Hip adduction        Hip internal rotation     5 5  Hip external rotation     5 5  Knee flexion     5 5  Knee extension     5 5  Ankle dorsiflexion     5 5  Ankle plantarflexion        Ankle inversion        Ankle eversion         (Blank rows = not tested)  TODAY'S TREATMENT:                                                                                                                              01/13/23 Reassessment Stairs: 7 inch alternating, decreased concentric and eccentric glute strength and motor control   Lateral step down 6 inch 3 x 6 Forward step down 4 inch 2 x 6 Hip hinge with foam roll 2 x 10  01/07/23 Step up 6 inch 2 x 10 Lateral step down 6 inch 3 x 5 Lunge 2 x 10 bilateral  Shuttle leg press 125 resistance 1 x 15  DL, 75 2 x 15 SL Lateral stepping GTB 6 x 10 feet bilateral  Single leg mini squat with 3 way slide out 2 x 5 bilateral   12/31/22 Bridge 2 x 10  Step up 4 inch 2 x 10 Lateral step down 4 inch 3 x 10 STS with LLE bias 3x 10 Single leg balance 3 way vector 5 x 5 second holds Shuttle leg press 125 resistance 2 x 10  Lateral stepping 5 x 10 feet bilateral  Prone hip extension 2 x 15 bilateral   12/23/22 Pt received L hip PROM in flex, abd, ER, and IR w/n pt and tissue tolerance in supine.  Reviewed and performed HEP. Quad sets with 5 sec hold x 10 reps Glute sets with 5 sec hold  x 10 reps SAQ 2x10 LAQ 2x10 Supine heel slide 2x10 prone knee flexion AROM 2x10 Hooklying Bent knee fall out with Tra 2x10 on L, 1x10 on R   PATIENT EDUCATION:  Education details: HEP, symptom management, POC, exercise form, and rationale of interventions.  Educated pt concerning using 1 crutch, decreasing Wb'ing, and/or resting if having increased pain and soreness with increased Wb'ing with ambulation. Person educated: Patient Education method: Explanation, demonstration, verbal cues, tactile cues, and handout Education comprehension: verbalized understanding, returned demonstration, verbal cues required, tactile cues required, and needs further education  HOME EXERCISE PROGRAM: Access Code: [email or web address] URL: [email or web address] Date: 12/15/2022 Prepared by: Lorayne Bender  Exercises - Supine Quad Set  - 1 x daily - 7 x weekly - 3 sets - 10 reps - Supine Gluteal Sets  - 1 x daily - 7 x weekly - 3 sets - 10 reps - Seated Ankle Pumps  - 1 x daily - 7 x weekly - 3 sets - 10 reps  Updated HEP: - Supine Short Arc Quad  - 2 x daily - 6-7 x weekly - 2 sets - 10 reps - Seated Long Arc Quad  - 1 x daily - 7 x weekly - 2 sets - 10 reps - Supine Heel Slide  - 1-2 x daily - 7 x weekly - 2 sets - 10 reps  12/31/22 - Step Up  - 1 x daily - 7 x weekly - 3 sets - 10 reps - Lateral Step Down  - 1 x daily - 7 x weekly - 3 sets - 10 reps - Sit to Stand with Arms Crossed  - 1 x daily - 7 x weekly - 3 sets - 10 reps - Single Leg Balance with Clock Reach  - 1 x daily - 7 x weekly - 2 sets - 5 reps - 5 second hold  01/07/23 - Single Leg Balance with Four Way Reach and Rotation  - 1 x daily - 7 x weekly - 3 sets - 10 reps  ASSESSMENT:  CLINICAL IMPRESSION: Patient has met 3/3 short term goals and 1/3 long term goals with ability to complete HEP and improvement in symptoms, strength, ROM, activity tolerance, gait, balance, and functional mobility. Remaining goals not met due to continued  deficits in functional strength, glute strength and motor control. Patient has made good progress toward remaining goals and is progressing very well. Patient educated on how to progress walking ability and beginning walking program.  Patient will continue to benefit from skilled physical therapy in order to improve function and reduce impairment.      OBJECTIVE IMPAIRMENTS: Abnormal gait, decreased activity tolerance, decreased balance, difficulty walking, decreased ROM, decreased strength, and pain.  ACTIVITY LIMITATIONS: carrying, lifting, bending, sitting, standing, squatting, stairs, transfers, bed mobility, dressing, and locomotion level  PARTICIPATION LIMITATIONS: meal prep, cleaning, driving, shopping, community activity, occupation, and yard work  PERSONAL FACTORS: 1-2 comorbidities: CHF, CKD  are also affecting patient's functional outcome.   REHAB POTENTIAL: Good  CLINICAL DECISION MAKING: Stable/uncomplicated  EVALUATION COMPLEXITY: Low   GOALS: Goals reviewed with patient? Yes  SHORT TERM GOALS: Target date: 01/26/2023   Patient will demonstrate full left hip passive range of motion Baseline: Goal status: MET  2.  Patient will demonstrate 80% of right hip strength for major muscle groups Baseline:  Goal status: MET  3.  Patient will progress off crutches as tolerated. Baseline:  Goal status: MET LONG TERM GOALS: Target date: 03/09/2023    Patient will ambulate community distances without antalgic gait Baseline:  Goal status: INITIAL  2.  Patient will go up and down 8 steps with reciprocal gait pattern without pain Baseline: 01/13/23 reciprocal pattern, no pain but decreased L glute strength and motor control Goal status: MET  3.  Patient will return to exercise program Baseline:  Goal status: INITIAL    PLAN:  PT FREQUENCY: 2x/week  PT DURATION: 8 weeks  PLANNED INTERVENTIONS:  Therapeutic exercises, Therapeutic activity, Neuromuscular  re-education, Balance training, Gait training, Patient/Family education, Self Care, Joint mobilization, Stair training, DME instructions, Aquatic Therapy, Dry Needling, Electrical stimulation, Cryotherapy, Moist heat, Taping, Manual therapy, and Re-evaluation.   PLAN FOR NEXT SESSION:   Consider soft tissue mobilization as needed.  Consider glutes strengthening.      Reola Mosher Arush Gatliff, PT 01/13/2023, 11:00 AM

## 2023-01-21 ENCOUNTER — Ambulatory Visit (HOSPITAL_BASED_OUTPATIENT_CLINIC_OR_DEPARTMENT_OTHER): Payer: 59 | Attending: Orthopaedic Surgery | Admitting: Physical Therapy

## 2023-01-21 ENCOUNTER — Encounter (HOSPITAL_BASED_OUTPATIENT_CLINIC_OR_DEPARTMENT_OTHER): Payer: Self-pay | Admitting: Physical Therapy

## 2023-01-21 DIAGNOSIS — R2689 Other abnormalities of gait and mobility: Secondary | ICD-10-CM | POA: Insufficient documentation

## 2023-01-21 DIAGNOSIS — M25652 Stiffness of left hip, not elsewhere classified: Secondary | ICD-10-CM | POA: Insufficient documentation

## 2023-01-21 DIAGNOSIS — M25552 Pain in left hip: Secondary | ICD-10-CM | POA: Diagnosis present

## 2023-01-21 NOTE — Therapy (Signed)
OUTPATIENT PHYSICAL THERAPY TREATMENT   Patient Name: Barry Nichols MRN: [id number] DOB:July 23, 1975, 47 y.o., male Today's Date: 01/21/2023  END OF SESSION:  PT End of Session - 01/21/23 1520     Visit Number 6    Number of Visits 24    Date for PT Re-Evaluation 03/09/23    Authorization Type UHC    PT Start Time 1520    PT Stop Time 1600    PT Time Calculation (min) 40 min    Activity Tolerance Patient tolerated treatment well;No increased pain    Behavior During Therapy WFL for tasks assessed/performed             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, Bilateral   Migraine    "when I was young" (08/25/2016"   Posterior equinus, acquired    Bilateral   Pre-diabetes    Past Surgical History:  Procedure Laterality Date   AV FISTULA PLACEMENT Left 10/15/2016   Procedure: ARTERIOVENOUS (AV) FISTULA CREATION-LEFT ARM;  Surgeon: Fransisco Hertz, MD;  Location: [facility] OR;  Service: Vascular;  Laterality: Left;   DECOMPRESSION HIP-CORE Left 12/07/2022   Procedure: LEFT HIP CORE DECOMPRESSION WITH ILIAC CREST BONE MARROW ASPIRATE;  Surgeon: Huel Cote, MD;  Location: [facility] OR;  Service: Orthopedics;  Laterality: Left;   INSERTION OF DIALYSIS CATHETER Right 10/15/2016   Procedure: INSERTION OF DIALYSIS CATHETER;  Surgeon: Fransisco Hertz, MD;  Location: [facility] OR;  Service: Vascular;  Laterality: Right;   KIDNEY TRANSPLANT  06/15/2020   [facility] TOOTH EXTRACTION     Patient Active Problem List   Diagnosis Date Noted   Avascular necrosis of bone of left hip (HCC) 12/07/2022    Hypoglycemia 08/25/2016   Pain in joint, ankle and foot 08/19/2012   Hypertension    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     PERTINENT HISTORY: Left hip avascular necrosis, CHF, DM2, stage IV CKD, history of gout, metatarsal deformity,  PAIN:  Are you having pain? Yes: NPRS scale: 0/10  Pain location: aching  Pain description: aching  Aggravating factors: bumping it  Relieving factors: rest   PRECAUTIONS: Other: TDWB core decompression   RED FLAGS: None   WEIGHT BEARING RESTRICTIONS: Yes =TDWB   FALLS:  Has patient fallen in last 6 months? No  LIVING ENVIRONMENT: 2 step into the house     OCCUPATION:  Not working  Hobbies:  Golfing, going to the gym    PLOF: Independent  PATIENT GOALS:   To return to normal function/ Avoiding a limp    NEXT MD VISIT:   OBJECTIVE:   DIAGNOSTIC FINDINGS:  Nothing post op    PATIENT SURVEYS:  FOTO     COGNITION: Overall cognitive status: Within functional limits for tasks assessed                         SENSATION: WFL       POSTURE: No Significant postural limitations   PALPATION: No unexpended tenderness to palpation    LOWER EXTREMITY ROM:   PROM Right eval Left eval Left 01/13/23  Hip flexion   No significant pain not pushed to end range  [facility]  Hip extension       Hip abduction       Hip  adduction       Hip internal rotation   5 degrees no significant pain  27  Hip external rotation   20 degrees not pushed to end range. No significant pain 24  Knee flexion       Knee extension       Ankle dorsiflexion       Ankle plantarflexion       Ankle inversion       Ankle eversion        (Blank rows = not tested)   LOWER EXTREMITY MMT:   MMT Right eval Left eval R 01/13/23 L 01/13/23  Hip flexion     5 4+  Hip extension     5 4+  Hip abduction     5 4+  Hip adduction        Hip internal rotation     5 5  Hip external rotation     5 5  Knee flexion     5 5  Knee extension     5 5  Ankle dorsiflexion     5 5  Ankle plantarflexion        Ankle inversion        Ankle eversion         (Blank rows = not tested)  TODAY'S TREATMENT:                                                                                                                              01/21/23 Elliptical 5 minutes level 8 for dynamic warm up and conditioning Leg press 175# 1x10 DL, 841# 1x 10 LLE bias, 32# 1 x 10 SL, 130# 1 x 10 Leg extension 70# 2 x 15 Shuttle 75# 3 x 15 Hip hinge 10# 2 x 10  01/13/23 Reassessment Stairs: 7 inch alternating, decreased concentric and eccentric glute strength and motor control   Lateral step down 6 inch 3 x 6 Forward step down 4 inch 2 x 6 Hip hinge with foam roll 2 x 10  01/07/23 Step up 6 inch 2 x 10 Lateral step down 6 inch 3 x 5 Lunge 2 x 10 bilateral  Shuttle leg press 125 resistance 1 x 15 DL, 75 2 x 15 SL Lateral stepping GTB 6 x 10 feet bilateral  Single leg mini squat with 3 way slide out 2 x 5 bilateral   12/31/22 Bridge 2 x 10  Step up 4 inch 2 x 10 Lateral step down 4 inch 3 x 10 STS with LLE bias 3x 10 Single leg balance 3 way vector 5 x 5 second holds Shuttle leg press 125 resistance 2 x 10  Lateral stepping 5 x 10 feet bilateral  Prone hip extension 2 x 15 bilateral   12/23/22 Pt received L hip PROM in flex, abd, ER, and IR w/n pt and tissue  tolerance in supine.  Reviewed and performed HEP. Quad sets with 5 sec hold x 10 reps Glute sets with 5 sec hold x 10 reps SAQ 2x10 LAQ 2x10 Supine heel slide 2x10 prone knee flexion AROM 2x10 Hooklying Bent knee fall out with Tra 2x10 on L, 1x10 on R   PATIENT EDUCATION:  Education details: HEP, symptom management, POC, exercise form, and rationale of interventions.  Educated pt concerning using 1 crutch, decreasing Wb'ing, and/or resting if having increased pain and soreness with increased Wb'ing with ambulation. Person educated: Patient Education method: Explanation, demonstration, verbal cues, tactile cues, and handout Education comprehension: verbalized understanding, returned demonstration, verbal cues required, tactile cues required, and needs further education  HOME EXERCISE PROGRAM: Access Code: [email or web address] URL: [email or web address] Date: 12/15/2022 Prepared by: Lorayne Bender  Exercises - Supine Quad Set  - 1 x daily - 7 x weekly - 3 sets - 10 reps - Supine Gluteal Sets  - 1 x daily - 7 x weekly - 3 sets - 10 reps - Seated Ankle Pumps  - 1 x daily - 7 x weekly - 3 sets - 10 reps  Updated HEP: - Supine Short Arc Quad  - 2 x daily - 6-7 x weekly - 2 sets - 10 reps - Seated Long Arc Quad  - 1 x daily - 7 x weekly - 2 sets - 10 reps - Supine Heel Slide  - 1-2 x daily - 7 x weekly - 2 sets - 10 reps  12/31/22 - Step Up  - 1 x daily - 7 x weekly - 3 sets - 10 reps - Lateral Step Down  - 1 x daily - 7 x weekly - 3 sets - 10 reps - Sit to Stand with Arms Crossed  - 1 x daily - 7 x weekly - 3 sets - 10 reps - Single Leg Balance with Clock Reach  - 1 x daily - 7 x weekly - 2 sets - 5 reps - 5 second hold  01/07/23 - Single Leg Balance with Four Way Reach and Rotation  - 1 x daily - 7 x weekly - 3 sets - 10 reps  ASSESSMENT:  CLINICAL IMPRESSION: Began session on elliptical for dynamic warm up and conditioning. Additional strengthening performed well on machines and  good hip hinge mechanics. Patient progressing well.  Patient will continue to benefit from skilled physical therapy in order to improve function and reduce impairment.      OBJECTIVE IMPAIRMENTS: Abnormal gait, decreased activity tolerance, decreased balance, difficulty walking, decreased ROM, decreased strength, and pain.   ACTIVITY LIMITATIONS: carrying, lifting, bending, sitting, standing, squatting, stairs, transfers, bed mobility, dressing, and locomotion level  PARTICIPATION LIMITATIONS: meal prep, cleaning, driving, shopping, community activity, occupation, and yard work  PERSONAL FACTORS: 1-2 comorbidities: CHF, CKD  are also affecting patient's functional outcome.   REHAB POTENTIAL: Good  CLINICAL DECISION MAKING: Stable/uncomplicated  EVALUATION COMPLEXITY: Low   GOALS: Goals reviewed with patient? Yes  SHORT TERM GOALS: Target date: 01/26/2023   Patient will demonstrate full left hip passive range of motion Baseline: Goal status: MET  2.  Patient will demonstrate 80% of right hip strength for major muscle groups Baseline:  Goal status: MET  3.  Patient will progress off crutches as tolerated. Baseline:  Goal status: MET LONG TERM GOALS: Target date: 03/09/2023    Patient will ambulate community distances without antalgic gait Baseline:  Goal status: INITIAL  2.  Patient will go up and down 8 steps with reciprocal gait pattern without pain Baseline: 01/13/23 reciprocal pattern, no pain but decreased L glute strength and motor control Goal status: MET  3.  Patient will return to exercise program Baseline:  Goal status: INITIAL    PLAN:  PT FREQUENCY: 2x/week  PT DURATION: 8 weeks  PLANNED INTERVENTIONS:  Therapeutic exercises, Therapeutic activity, Neuromuscular re-education, Balance training, Gait training, Patient/Family education, Self Care, Joint mobilization, Stair training, DME instructions, Aquatic Therapy, Dry Needling, Electrical  stimulation, Cryotherapy, Moist heat, Taping, Manual therapy, and Re-evaluation.   PLAN FOR NEXT SESSION:   Consider soft tissue mobilization as needed.  Consider glutes strengthening.      Reola Mosher Monda Chastain, PT 01/21/2023, 3:21 PM

## 2023-01-29 ENCOUNTER — Ambulatory Visit (HOSPITAL_BASED_OUTPATIENT_CLINIC_OR_DEPARTMENT_OTHER): Payer: 59 | Admitting: Physical Therapy

## 2023-01-29 ENCOUNTER — Encounter (HOSPITAL_BASED_OUTPATIENT_CLINIC_OR_DEPARTMENT_OTHER): Payer: Self-pay | Admitting: Physical Therapy

## 2023-01-29 DIAGNOSIS — M25552 Pain in left hip: Secondary | ICD-10-CM | POA: Diagnosis not present

## 2023-01-29 DIAGNOSIS — M25652 Stiffness of left hip, not elsewhere classified: Secondary | ICD-10-CM

## 2023-01-29 DIAGNOSIS — R2689 Other abnormalities of gait and mobility: Secondary | ICD-10-CM

## 2023-01-29 NOTE — Therapy (Signed)
OUTPATIENT PHYSICAL THERAPY TREATMENT   Patient Name: Barry Nichols MRN: [id number] DOB:1975/11/14, 47 y.o., male Today's Date: 01/29/2023  END OF SESSION:  PT End of Session - 01/29/23 0845     Visit Number 7    Number of Visits 24    Date for PT Re-Evaluation 03/09/23    Authorization Type UHC    PT Start Time 0845    PT Stop Time 0925    PT Time Calculation (min) 40 min    Activity Tolerance Patient tolerated treatment well;No increased pain    Behavior During Therapy WFL for tasks assessed/performed             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, Bilateral   Migraine    "when I was young" (08/25/2016"   Posterior equinus, acquired    Bilateral   Pre-diabetes    Past Surgical History:  Procedure Laterality Date   AV FISTULA PLACEMENT Left 10/15/2016   Procedure: ARTERIOVENOUS (AV) FISTULA CREATION-LEFT ARM;  Surgeon: Fransisco Hertz, MD;  Location: [facility] OR;  Service: Vascular;  Laterality: Left;   DECOMPRESSION HIP-CORE Left 12/07/2022   Procedure: LEFT HIP CORE DECOMPRESSION WITH ILIAC CREST BONE MARROW ASPIRATE;  Surgeon: Huel Cote, MD;  Location: [facility] OR;  Service: Orthopedics;  Laterality: Left;   INSERTION OF DIALYSIS CATHETER Right 10/15/2016   Procedure: INSERTION OF DIALYSIS CATHETER;  Surgeon: Fransisco Hertz, MD;  Location: [facility] OR;  Service: Vascular;  Laterality: Right;   KIDNEY TRANSPLANT  06/15/2020   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   ESRD on dialysis [facility]) 09/11/2011   Anemia 09/11/2011   Non-compliant patient 09/11/2011    NWG:NFAOZH Real Cons PA  REFERRING PROVIDER: Huel Cote MD   REFERRING DIAG:  Diagnosis  M87.052 (ICD-10-CM) - Avascular necrosis of bone of left hip (HCC)    THERAPY DIAG:  Pain in left hip  Stiffness of left hip, not elsewhere classified  Other abnormalities of gait and mobility  Rationale for Evaluation and Treatment: Rehabilitation  ONSET DATE: Left hip core decompression 12/07/2022  SUBJECTIVE:   SUBJECTIVE STATEMENT: Patient states did a lot of walking, hills and stairs on vacation.     PERTINENT HISTORY: Left hip avascular necrosis, CHF, DM2, stage IV CKD, history of gout, metatarsal deformity,  PAIN:  Are you having pain? Yes: NPRS scale: 0/10  Pain location: aching  Pain description: aching  Aggravating factors: bumping it  Relieving factors: rest   PRECAUTIONS: Other: TDWB core decompression   RED FLAGS: None   WEIGHT BEARING RESTRICTIONS: Yes =TDWB   FALLS:  Has patient fallen in last 6 months? No  LIVING ENVIRONMENT: 2 step into the house     OCCUPATION:  Not working   Hobbies:  Multimedia programmer, going to the gym    PLOF: Independent  PATIENT GOALS:   To return to normal function/ Avoiding a limp    NEXT MD VISIT:   OBJECTIVE:   DIAGNOSTIC FINDINGS:  Nothing post op    PATIENT SURVEYS:  FOTO     COGNITION: Overall cognitive status: Within functional limits for tasks assessed                         SENSATION: WFL       POSTURE: No Significant postural limitations   PALPATION: No unexpended tenderness to palpation    LOWER EXTREMITY ROM:   PROM Right eval Left eval Left 01/13/23  Hip flexion   No significant pain not pushed to end range  [facility]  Hip extension       Hip abduction       Hip  adduction       Hip internal rotation   5 degrees no significant pain  27  Hip external rotation   20 degrees not pushed to end range. No significant pain 24  Knee flexion       Knee extension       Ankle dorsiflexion       Ankle plantarflexion       Ankle inversion       Ankle eversion        (Blank rows = not tested)   LOWER EXTREMITY MMT:   MMT Right eval Left eval R 01/13/23 L 01/13/23  Hip flexion     5 4+  Hip extension     5 4+  Hip abduction     5 4+  Hip adduction        Hip internal rotation     5 5  Hip external rotation     5 5  Knee flexion     5 5  Knee extension     5 5  Ankle dorsiflexion     5 5  Ankle plantarflexion        Ankle inversion        Ankle eversion         (Blank rows = not tested)  TODAY'S TREATMENT:                                                                                                                              01/29/23 Bike 5 minutes level 10 for dynamic warm up and conditioning Supine active hamstring stretch 10 x 10 second holds Supine piriformis stretch 10 x 10 second holds Leg press 160# 2x10 DL, 40# 1 x 10 SL, 10# 1 x 10 SL Hamstring curl single leg 40# 2 x 10  01/21/23 Elliptical 5 minutes level 8 for dynamic warm up and conditioning Leg press 175# 1x10 DL, 272# 1x 10 LLE bias, 53# 1 x 10 SL, 130# 1 x 10 Leg extension 70#  2 x 15 Shuttle 75# 3 x 15 Hip hinge 10# 2 x 10  01/13/23 Reassessment Stairs: 7 inch alternating, decreased concentric and eccentric glute strength and motor control   Lateral step down 6 inch 3 x 6 Forward step down 4 inch 2 x 6 Hip hinge with foam roll 2 x 10  01/07/23 Step up 6 inch 2 x 10 Lateral step down 6 inch 3 x 5 Lunge 2 x 10 bilateral  Shuttle leg press 125 resistance 1 x 15 DL, 75 2 x 15 SL Lateral stepping GTB 6 x 10 feet bilateral  Single leg mini squat with 3 way slide out 2 x 5 bilateral   12/31/22 Bridge 2 x 10  Step up 4 inch 2 x 10 Lateral step down 4 inch 3 x 10 STS with  LLE bias 3x 10 Single leg balance 3 way vector 5 x 5 second holds Shuttle leg press 125 resistance 2 x 10  Lateral stepping 5 x 10 feet bilateral  Prone hip extension 2 x 15 bilateral   12/23/22 Pt received L hip PROM in flex, abd, ER, and IR w/n pt and tissue tolerance in supine.  Reviewed and performed HEP. Quad sets with 5 sec hold x 10 reps Glute sets with 5 sec hold x 10 reps SAQ 2x10 LAQ 2x10 Supine heel slide 2x10 prone knee flexion AROM 2x10 Hooklying Bent knee fall out with Tra 2x10 on L, 1x10 on R   PATIENT EDUCATION:  Education details: HEP, symptom management, POC, exercise form, and rationale of interventions.  Educated pt concerning using 1 crutch, decreasing Wb'ing, and/or resting if having increased pain and soreness with increased Wb'ing with ambulation. Person educated: Patient Education method: Explanation, demonstration, verbal cues, tactile cues, and handout Education comprehension: verbalized understanding, returned demonstration, verbal cues required, tactile cues required, and needs further education  HOME EXERCISE PROGRAM: Access Code: [email or web address] URL: [email or web address] Date: 12/15/2022 Prepared by: Lorayne Bender  Exercises - Supine Quad Set  - 1 x daily - 7 x weekly - 3 sets - 10 reps - Supine Gluteal Sets  - 1 x daily - 7 x weekly - 3 sets - 10 reps - Seated Ankle Pumps  - 1 x daily - 7 x weekly - 3 sets - 10 reps  Updated HEP: - Supine Short Arc Quad  - 2 x daily - 6-7 x weekly - 2 sets - 10 reps - Seated Long Arc Quad  - 1 x daily - 7 x weekly - 2 sets - 10 reps - Supine Heel Slide  - 1-2 x daily - 7 x weekly - 2 sets - 10 reps  12/31/22 - Step Up  - 1 x daily - 7 x weekly - 3 sets - 10 reps - Lateral Step Down  - 1 x daily - 7 x weekly - 3 sets - 10 reps - Sit to Stand with Arms Crossed  - 1 x daily - 7 x weekly - 3 sets - 10 reps - Single Leg Balance with Clock Reach  - 1 x daily - 7 x weekly - 2 sets - 5 reps - 5 second  hold  01/07/23 - Single Leg Balance with Four Way Reach and Rotation  - 1 x daily - 7 x weekly - 3 sets - 10 reps  ASSESSMENT:  CLINICAL IMPRESSION: Began session on bike for dynamic warm up and conditioning. Performed stretches for discomfort in L hamstring/piriformis/glutes. Difficulty with previously completed weights likely due to fatigue.  Patient will continue to benefit from skilled physical therapy in order to improve function and reduce impairment.     OBJECTIVE IMPAIRMENTS: Abnormal gait, decreased activity tolerance, decreased balance, difficulty walking, decreased ROM, decreased strength, and pain.   ACTIVITY LIMITATIONS: carrying, lifting, bending, sitting, standing, squatting, stairs, transfers, bed mobility, dressing, and locomotion level  PARTICIPATION LIMITATIONS: meal prep, cleaning, driving, shopping, community activity, occupation, and yard work  PERSONAL FACTORS: 1-2 comorbidities: CHF, CKD  are also affecting patient's functional outcome.   REHAB POTENTIAL: Good  CLINICAL DECISION MAKING: Stable/uncomplicated  EVALUATION COMPLEXITY: Low   GOALS: Goals reviewed with patient? Yes  SHORT TERM GOALS: Target date: 01/26/2023   Patient will demonstrate full left hip passive range of motion Baseline: Goal status: MET  2.  Patient will demonstrate 80% of right hip strength for major muscle groups Baseline:  Goal status: MET  3.  Patient will progress off crutches as tolerated. Baseline:  Goal status: MET LONG TERM GOALS: Target date: 03/09/2023    Patient will ambulate community distances without antalgic gait Baseline:  Goal status: INITIAL  2.  Patient will go up and down 8 steps with reciprocal gait pattern without pain Baseline: 01/13/23 reciprocal pattern, no pain but decreased L glute strength and motor control Goal status: MET  3.  Patient will return to exercise program Baseline:  Goal status: INITIAL    PLAN:  PT FREQUENCY:  2x/week  PT DURATION: 8 weeks  PLANNED INTERVENTIONS:  Therapeutic exercises, Therapeutic activity, Neuromuscular re-education, Balance training, Gait training, Patient/Family education, Self Care, Joint mobilization, Stair training, DME instructions, Aquatic Therapy, Dry Needling, Electrical stimulation, Cryotherapy, Moist heat, Taping, Manual therapy, and Re-evaluation.   PLAN FOR NEXT SESSION:   Consider soft tissue mobilization as needed.  Consider glutes strengthening.      Reola Mosher Talayla Doyel, PT 01/29/2023, 8:45 AM

## 2023-02-05 ENCOUNTER — Encounter (HOSPITAL_BASED_OUTPATIENT_CLINIC_OR_DEPARTMENT_OTHER): Payer: Self-pay | Admitting: Physical Therapy

## 2023-02-05 ENCOUNTER — Ambulatory Visit (HOSPITAL_BASED_OUTPATIENT_CLINIC_OR_DEPARTMENT_OTHER): Payer: 59 | Admitting: Physical Therapy

## 2023-02-05 DIAGNOSIS — M25552 Pain in left hip: Secondary | ICD-10-CM | POA: Diagnosis not present

## 2023-02-05 DIAGNOSIS — R2689 Other abnormalities of gait and mobility: Secondary | ICD-10-CM

## 2023-02-05 DIAGNOSIS — M25652 Stiffness of left hip, not elsewhere classified: Secondary | ICD-10-CM

## 2023-02-05 NOTE — Therapy (Signed)
OUTPATIENT PHYSICAL THERAPY TREATMENT   Patient Name: Barry Nichols MRN: [id number] DOB:20-Aug-1975, 47 y.o., male Today's Date: 02/05/2023  END OF SESSION:  PT End of Session - 02/05/23 0717     Visit Number 8    Number of Visits 24    Date for PT Re-Evaluation 03/09/23    Authorization Type UHC    PT Start Time 0717    PT Stop Time 0757    PT Time Calculation (min) 40 min    Activity Tolerance Patient tolerated treatment well;No increased pain    Behavior During Therapy WFL for tasks assessed/performed             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, Bilateral   Migraine    "when I was young" (08/25/2016"   Posterior equinus, acquired    Bilateral   Pre-diabetes    Past Surgical History:  Procedure Laterality Date   AV FISTULA PLACEMENT Left 10/15/2016   Procedure: ARTERIOVENOUS (AV) FISTULA CREATION-LEFT ARM;  Surgeon: Fransisco Hertz, MD;  Location: [facility] OR;  Service: Vascular;  Laterality: Left;   DECOMPRESSION HIP-CORE Left 12/07/2022   Procedure: LEFT HIP CORE DECOMPRESSION WITH ILIAC CREST BONE MARROW ASPIRATE;  Surgeon: Huel Cote, MD;  Location: [facility] OR;  Service: Orthopedics;  Laterality: Left;   INSERTION OF DIALYSIS CATHETER Right 10/15/2016   Procedure: INSERTION OF DIALYSIS CATHETER;  Surgeon: Fransisco Hertz, MD;  Location: [facility] OR;  Service: Vascular;  Laterality: Right;   KIDNEY TRANSPLANT  06/15/2020   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 Feels symptoms at hip flexor region and hamstring origin. Normally about 4000 steps lately and on trip [id number] with steps/hills. Having trouble with previously completed exercises.     PERTINENT HISTORY: Left hip avascular necrosis, CHF, DM2, stage IV CKD, history of gout, metatarsal deformity,  PAIN:  Are you having pain? Yes: NPRS scale: 3/10  Pain location: aching  Pain description: aching  Aggravating factors: bumping it  Relieving factors: rest   PRECAUTIONS: Other: TDWB core decompression   RED FLAGS: None   WEIGHT BEARING RESTRICTIONS: Yes =TDWB   FALLS:  Has patient fallen in last 6 months? No  LIVING ENVIRONMENT: 2 step into the house     OCCUPATION:  Not working   Hobbies:  Multimedia programmer, going to the gym    PLOF: Independent  PATIENT GOALS:   To return to normal function/ Avoiding a limp    NEXT MD VISIT:   OBJECTIVE:   DIAGNOSTIC FINDINGS:  Nothing post op    PATIENT SURVEYS:  FOTO     COGNITION: Overall cognitive status: Within functional limits for tasks assessed                         SENSATION: WFL       POSTURE: No Significant postural limitations   PALPATION: No unexpended tenderness to palpation    LOWER EXTREMITY ROM:    PROM Right eval Left eval Left 01/13/23  Hip flexion   No significant pain not pushed to end range  [facility]  Hip extension       Hip abduction       Hip adduction       Hip internal rotation   5 degrees no significant pain  27  Hip external rotation   20 degrees not pushed to end range. No significant pain 24  Knee flexion       Knee extension       Ankle dorsiflexion       Ankle plantarflexion       Ankle inversion       Ankle eversion        (Blank rows = not tested)   LOWER EXTREMITY MMT:   MMT Right eval Left eval R 01/13/23 L 01/13/23  Hip flexion     5 4+  Hip extension     5 4+  Hip abduction     5 4+  Hip adduction        Hip internal rotation     5 5  Hip external rotation     5 5  Knee flexion     5 5  Knee extension     5 5  Ankle dorsiflexion     5 5  Ankle plantarflexion        Ankle inversion        Ankle eversion         (Blank rows = not tested)  TODAY'S TREATMENT:                                                                                                                              02/05/23 Manual: STM to L hip flexors Bridge 2 x 10  Prone hip extension 2 x 10  Sidelying hip abduction 2 x 10  Shuttle 100 DL 3 x 10, 50 SL 2 x 10 Lateral stepping 2 x 10 feet  01/29/23 Bike 5 minutes level 10 for dynamic warm up and conditioning Supine active hamstring stretch 10 x 10 second holds  Supine piriformis stretch 10 x 10 second holds Leg press 160# 2x10 DL, 29# 1 x 10 SL, 56# 1 x 10 SL Hamstring curl single leg 40# 2 x 10  01/21/23 Elliptical 5 minutes level 8 for dynamic warm up and conditioning Leg press 175# 1x10 DL, 213# 1x 10 LLE bias, 08# 1 x 10 SL, 130# 1 x 10 Leg extension 70# 2 x 15 Shuttle 75# 3 x 15 Hip hinge 10# 2 x 10  01/13/23 Reassessment Stairs: 7 inch alternating, decreased concentric and eccentric glute strength and motor control   Lateral step down 6 inch 3 x 6 Forward step down 4 inch 2 x 6 Hip hinge with foam roll 2 x  10  01/07/23 Step up 6 inch 2 x 10 Lateral step down 6 inch 3 x 5 Lunge 2 x 10 bilateral  Shuttle leg press 125 resistance 1 x 15 DL, 75 2 x 15 SL Lateral stepping GTB 6 x 10 feet bilateral  Single leg mini squat with 3 way slide out 2 x 5 bilateral   12/31/22 Bridge 2 x 10  Step up 4 inch 2 x 10 Lateral step down 4 inch 3 x 10 STS with LLE bias 3x 10 Single leg balance 3 way vector 5 x 5 second holds Shuttle leg press 125 resistance 2 x 10  Lateral stepping 5 x 10 feet bilateral  Prone hip extension 2 x 15 bilateral   12/23/22 Pt received L hip PROM in flex, abd, ER, and IR w/n pt and tissue tolerance in supine.  Reviewed and performed HEP. Quad sets with 5 sec hold x 10 reps Glute sets with 5 sec hold x 10 reps SAQ 2x10 LAQ 2x10 Supine heel slide 2x10 prone knee flexion AROM 2x10 Hooklying Bent knee fall out with Tra 2x10 on L, 1x10 on R   PATIENT EDUCATION:  Education details: HEP, symptom management, POC, exercise form, and rationale of interventions.  Educated pt concerning using 1 crutch, decreasing Wb'ing, and/or resting if having increased pain and soreness with increased Wb'ing with ambulation. Person educated: Patient Education method: Explanation, demonstration, verbal cues, tactile cues, and handout Education comprehension: verbalized understanding, returned demonstration, verbal cues required, tactile cues required, and needs further education  HOME EXERCISE PROGRAM: Access Code: [email or web address] URL: [email or web address] Date: 12/15/2022 Prepared by: Lorayne Bender  Exercises - Supine Quad Set  - 1 x daily - 7 x weekly - 3 sets - 10 reps - Supine Gluteal Sets  - 1 x daily - 7 x weekly - 3 sets - 10 reps - Seated Ankle Pumps  - 1 x daily - 7 x weekly - 3 sets - 10 reps  Updated HEP: - Supine Short Arc Quad  - 2 x daily - 6-7 x weekly - 2 sets - 10 reps - Seated Long Arc Quad  - 1 x daily - 7 x weekly - 2 sets - 10 reps - Supine Heel Slide  - 1-2 x  daily - 7 x weekly - 2 sets - 10 reps  12/31/22 - Step Up  - 1 x daily - 7 x weekly - 3 sets - 10 reps - Lateral Step Down  - 1 x daily - 7 x weekly - 3 sets - 10 reps - Sit to Stand with Arms Crossed  - 1 x daily - 7 x weekly - 3 sets - 10 reps - Single Leg Balance with Clock Reach  - 1 x daily - 7 x weekly - 2  sets - 5 reps - 5 second hold  01/07/23 - Single Leg Balance with Four Way Reach and Rotation  - 1 x daily - 7 x weekly - 3 sets - 10 reps  ASSESSMENT:  CLINICAL IMPRESSION: Patient recent symptoms likely due to increase in activity causing hip flexor irritation. Improvement in symptoms following STM. Continued with glute/quad strengthening. Patient will continue to benefit from skilled physical therapy in order to improve function and reduce impairment.     OBJECTIVE IMPAIRMENTS: Abnormal gait, decreased activity tolerance, decreased balance, difficulty walking, decreased ROM, decreased strength, and pain.   ACTIVITY LIMITATIONS: carrying, lifting, bending, sitting, standing, squatting, stairs, transfers, bed mobility, dressing, and locomotion level  PARTICIPATION LIMITATIONS: meal prep, cleaning, driving, shopping, community activity, occupation, and yard work  PERSONAL FACTORS: 1-2 comorbidities: CHF, CKD  are also affecting patient's functional outcome.   REHAB POTENTIAL: Good  CLINICAL DECISION MAKING: Stable/uncomplicated  EVALUATION COMPLEXITY: Low   GOALS: Goals reviewed with patient? Yes  SHORT TERM GOALS: Target date: 01/26/2023   Patient will demonstrate full left hip passive range of motion Baseline: Goal status: MET  2.  Patient will demonstrate 80% of right hip strength for major muscle groups Baseline:  Goal status: MET  3.  Patient will progress off crutches as tolerated. Baseline:  Goal status: MET LONG TERM GOALS: Target date: 03/09/2023    Patient will ambulate community distances without antalgic gait Baseline:  Goal status: INITIAL  2.   Patient will go up and down 8 steps with reciprocal gait pattern without pain Baseline: 01/13/23 reciprocal pattern, no pain but decreased L glute strength and motor control Goal status: MET  3.  Patient will return to exercise program Baseline:  Goal status: INITIAL    PLAN:  PT FREQUENCY: 2x/week  PT DURATION: 8 weeks  PLANNED INTERVENTIONS:  Therapeutic exercises, Therapeutic activity, Neuromuscular re-education, Balance training, Gait training, Patient/Family education, Self Care, Joint mobilization, Stair training, DME instructions, Aquatic Therapy, Dry Needling, Electrical stimulation, Cryotherapy, Moist heat, Taping, Manual therapy, and Re-evaluation.   PLAN FOR NEXT SESSION:   Consider soft tissue mobilization as needed.  Consider glutes strengthening.      Reola Mosher Yusuf Yu, PT 02/05/2023, 7:17 AM

## 2023-02-10 ENCOUNTER — Encounter (HOSPITAL_BASED_OUTPATIENT_CLINIC_OR_DEPARTMENT_OTHER): Payer: Medicare Other | Admitting: Orthopaedic Surgery

## 2023-02-12 ENCOUNTER — Encounter (HOSPITAL_BASED_OUTPATIENT_CLINIC_OR_DEPARTMENT_OTHER): Payer: Self-pay | Admitting: Physical Therapy

## 2023-02-12 ENCOUNTER — Ambulatory Visit (HOSPITAL_BASED_OUTPATIENT_CLINIC_OR_DEPARTMENT_OTHER): Payer: 59 | Admitting: Physical Therapy

## 2023-02-12 DIAGNOSIS — M25652 Stiffness of left hip, not elsewhere classified: Secondary | ICD-10-CM

## 2023-02-12 DIAGNOSIS — R2689 Other abnormalities of gait and mobility: Secondary | ICD-10-CM

## 2023-02-12 DIAGNOSIS — M25552 Pain in left hip: Secondary | ICD-10-CM | POA: Diagnosis not present

## 2023-02-12 NOTE — Therapy (Signed)
OUTPATIENT PHYSICAL THERAPY TREATMENT   Patient Name: Barry Nichols MRN: [id number] DOB:[birth year], 47 y.o., male Today's Date: 02/12/2023  END OF SESSION:  PT End of Session - 02/12/23 0718     Visit Number 9    Number of Visits 24    Date for PT Re-Evaluation 03/09/23    Authorization Type UHC    PT Start Time 0717    PT Stop Time 0757    PT Time Calculation (min) 40 min    Activity Tolerance Patient tolerated treatment well;No increased pain    Behavior During Therapy WFL for tasks assessed/performed             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, Bilateral   Migraine    "when I was young" (08/25/2016"   Posterior equinus, acquired    Bilateral   Pre-diabetes    Past Surgical History:  Procedure Laterality Date   AV FISTULA PLACEMENT Left 10/15/2016   Procedure: ARTERIOVENOUS (AV) FISTULA CREATION-LEFT ARM;  Surgeon: Fransisco Hertz, MD;  Location: [facility] OR;  Service: Vascular;  Laterality: Left;   DECOMPRESSION HIP-CORE Left 12/07/2022   Procedure: LEFT HIP CORE DECOMPRESSION WITH ILIAC CREST BONE MARROW ASPIRATE;  Surgeon: Huel Cote, MD;  Location: [facility] OR;  Service: Orthopedics;  Laterality: Left;   INSERTION OF DIALYSIS CATHETER Right 10/15/2016   Procedure: INSERTION OF DIALYSIS CATHETER;  Surgeon: Fransisco Hertz, MD;  Location: [facility] OR;  Service: Vascular;  Laterality: Right;   KIDNEY TRANSPLANT  06/15/2020   [facility] TOOTH EXTRACTION     Patient Active Problem List   Diagnosis Date Noted   Avascular necrosis of bone of left hip (HCC) 12/07/2022    Hypoglycemia 08/25/2016   Pain in joint, ankle and foot 08/19/2012   Hypertension    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 Was feeling about an 8/10 a couple weeks ago, then a 2/10 last week and now feeling about a 4-5/10 in terms of function.      PERTINENT HISTORY: Left hip avascular necrosis, CHF, DM2, stage IV CKD, history of gout, metatarsal deformity,  PAIN:  Are you having pain? Yes: NPRS scale: 0/10  Pain location: aching  Pain description: aching  Aggravating factors: bumping it  Relieving factors: rest   PRECAUTIONS: Other: TDWB core decompression   RED FLAGS: None   WEIGHT BEARING RESTRICTIONS: Yes =TDWB   FALLS:  Has patient fallen  in last 6 months? No  LIVING ENVIRONMENT: 2 step into the house     OCCUPATION:  Not working   Hobbies:  Multimedia programmer, going to the gym    PLOF: Independent  PATIENT GOALS:   To return to normal function/ Avoiding a limp    NEXT MD VISIT:   OBJECTIVE:   DIAGNOSTIC FINDINGS:  Nothing post op    PATIENT SURVEYS:  FOTO     COGNITION: Overall cognitive status: Within functional limits for tasks assessed                         SENSATION: WFL       POSTURE: No Significant postural limitations   PALPATION: No unexpended tenderness to palpation    LOWER EXTREMITY ROM:   PROM Right eval Left eval Left 01/13/23  Hip flexion   No  significant pain not pushed to end range  [facility]  Hip extension       Hip abduction       Hip adduction       Hip internal rotation   5 degrees no significant pain  27  Hip external rotation   20 degrees not pushed to end range. No significant pain 24  Knee flexion       Knee extension       Ankle dorsiflexion       Ankle plantarflexion       Ankle inversion       Ankle eversion        (Blank rows = not tested)   LOWER EXTREMITY MMT:   MMT Right eval Left eval R 01/13/23 L 01/13/23  Hip flexion     5 4+  Hip extension     5 4+  Hip abduction     5 4+  Hip adduction        Hip internal rotation     5 5  Hip external rotation     5 5  Knee flexion     5 5  Knee extension     5 5  Ankle dorsiflexion     5 5  Ankle plantarflexion        Ankle inversion        Ankle eversion         (Blank rows = not tested)  TODAY'S TREATMENT:                                                                                                                              02/12/23 Upright bike level 10 5 minutes Step up 6 inch 2 x 10 Lateral step up 6 inch 2 x 10 Shuttle 100 DL 3 x 10, 50 SL 2 x 10 Thomas hip flexor stretch 3 x 30-60 second holds Standing hip flexor stretch 3 x 20-30 second holds Lateral step down 4 inch 2 x 10    02/05/23 Manual: STM to L hip  flexors Bridge 2 x 10  Prone hip extension 2 x 10  Sidelying hip abduction 2 x 10  Shuttle 100 DL 3 x 10, 50 SL 2 x 10 Lateral stepping 2 x 10 feet  01/29/23 Bike 5 minutes level 10 for dynamic warm up and conditioning Supine active hamstring stretch 10 x 10 second holds Supine piriformis stretch 10 x 10 second holds Leg press 160# 2x10 DL, 69# 1 x 10 SL, 62# 1 x 10 SL Hamstring curl single leg 40# 2 x 10  01/21/23 Elliptical 5 minutes level 8 for dynamic warm up and conditioning Leg press 175# 1x10 DL, 952# 1x 10 LLE bias, 84# 1 x 10 SL, 130# 1 x 10 Leg extension 70# 2 x 15 Shuttle 75# 3 x 15 Hip hinge 10# 2 x  10  01/13/23 Reassessment Stairs: 7 inch alternating, decreased concentric and eccentric glute strength and motor control   Lateral step down 6 inch 3 x 6 Forward step down 4 inch 2 x 6 Hip hinge with foam roll 2 x 10  01/07/23 Step up 6 inch 2 x 10 Lateral step down 6 inch 3 x 5 Lunge 2 x 10 bilateral  Shuttle leg press 125 resistance 1 x 15 DL, 75 2 x 15 SL Lateral stepping GTB 6 x 10 feet bilateral  Single leg mini squat with 3 way slide out 2 x 5 bilateral   12/31/22 Bridge 2 x 10  Step up 4 inch 2 x 10 Lateral step down 4 inch 3 x 10 STS with LLE bias 3x 10 Single leg balance 3 way vector 5 x 5 second holds Shuttle leg press 125 resistance 2 x 10  Lateral stepping 5 x 10 feet bilateral  Prone hip extension 2 x 15 bilateral   12/23/22 Pt received L hip PROM in flex, abd, ER, and IR w/n pt and tissue tolerance in supine.  Reviewed and performed HEP. Quad sets with 5 sec hold x 10 reps Glute sets with 5 sec hold x 10 reps SAQ 2x10 LAQ 2x10 Supine heel slide 2x10 prone knee flexion AROM 2x10 Hooklying Bent knee fall out with Tra 2x10 on L, 1x10 on R   PATIENT EDUCATION:  Education details: HEP, symptom management, POC, exercise form, and rationale of interventions.  Educated pt concerning using 1 crutch, decreasing Wb'ing, and/or resting if having increased pain and soreness with increased Wb'ing with ambulation. Person educated: Patient Education method: Explanation, demonstration, verbal cues, tactile cues, and handout Education comprehension: verbalized understanding, returned demonstration, verbal cues required, tactile cues required, and needs further education  HOME EXERCISE PROGRAM: Access Code: [email or web address] URL: [email or web address] Date: 12/15/2022 Prepared by: Lorayne Bender  Exercises - Supine Quad Set  - 1 x daily - 7 x weekly - 3 sets - 10 reps - Supine Gluteal Sets  - 1 x daily - 7 x weekly - 3 sets - 10 reps - Seated Ankle Pumps  - 1 x daily  - 7 x weekly - 3 sets - 10 reps  Updated HEP: - Supine Short Arc Quad  - 2 x daily - 6-7 x weekly - 2 sets - 10 reps - Seated Long Arc Quad  - 1 x daily - 7 x weekly - 2 sets - 10 reps - Supine Heel Slide  - 1-2 x daily - 7 x weekly - 2 sets - 10 reps  12/31/22 - Step Up  - 1 x daily - 7 x weekly - 3 sets - 10 reps -  Lateral Step Down  - 1 x daily - 7 x weekly - 3 sets - 10 reps - Sit to Stand with Arms Crossed  - 1 x daily - 7 x weekly - 3 sets - 10 reps - Single Leg Balance with Clock Reach  - 1 x daily - 7 x weekly - 2 sets - 5 reps - 5 second hold  01/07/23 - Single Leg Balance with Four Way Reach and Rotation  - 1 x daily - 7 x weekly - 3 sets - 10 reps  02/02/23- Thomas Stretch on Table  - 1 x daily - 7 x weekly - 3 reps - 30 -60 second hold - Hip Flexor Stretch on Step  - 1 x daily - 7 x weekly - 3 reps - 30 second hold  ASSESSMENT:  CLINICAL IMPRESSION: Began session on upright bike for dynamic warm up and conditioning. Returned to functional strengthening today which is tolerated well with good mechanics throughout. Hip flexor/TFL symptoms with step up with LE ER which improves with cueing for positioning. Stretches performed with improvement in ROM following. Patient will continue to benefit from skilled physical therapy in order to improve function and reduce impairment.     OBJECTIVE IMPAIRMENTS: Abnormal gait, decreased activity tolerance, decreased balance, difficulty walking, decreased ROM, decreased strength, and pain.   ACTIVITY LIMITATIONS: carrying, lifting, bending, sitting, standing, squatting, stairs, transfers, bed mobility, dressing, and locomotion level  PARTICIPATION LIMITATIONS: meal prep, cleaning, driving, shopping, community activity, occupation, and yard work  PERSONAL FACTORS: 1-2 comorbidities: CHF, CKD  are also affecting patient's functional outcome.   REHAB POTENTIAL: Good  CLINICAL DECISION MAKING: Stable/uncomplicated  EVALUATION COMPLEXITY:  Low   GOALS: Goals reviewed with patient? Yes  SHORT TERM GOALS: Target date: 01/26/2023   Patient will demonstrate full left hip passive range of motion Baseline: Goal status: MET  2.  Patient will demonstrate 80% of right hip strength for major muscle groups Baseline:  Goal status: MET  3.  Patient will progress off crutches as tolerated. Baseline:  Goal status: MET LONG TERM GOALS: Target date: 03/09/2023    Patient will ambulate community distances without antalgic gait Baseline:  Goal status: INITIAL  2.  Patient will go up and down 8 steps with reciprocal gait pattern without pain Baseline: 01/13/23 reciprocal pattern, no pain but decreased L glute strength and motor control Goal status: MET  3.  Patient will return to exercise program Baseline:  Goal status: INITIAL    PLAN:  PT FREQUENCY: 2x/week  PT DURATION: 8 weeks  PLANNED INTERVENTIONS:  Therapeutic exercises, Therapeutic activity, Neuromuscular re-education, Balance training, Gait training, Patient/Family education, Self Care, Joint mobilization, Stair training, DME instructions, Aquatic Therapy, Dry Needling, Electrical stimulation, Cryotherapy, Moist heat, Taping, Manual therapy, and Re-evaluation.   PLAN FOR NEXT SESSION:   Consider soft tissue mobilization as needed.  Consider glutes strengthening.      Reola Mosher Caster Fayette, PT 02/12/2023, 7:18 AM

## 2023-02-17 ENCOUNTER — Ambulatory Visit (HOSPITAL_BASED_OUTPATIENT_CLINIC_OR_DEPARTMENT_OTHER): Payer: 59 | Admitting: Physical Therapy

## 2023-02-17 ENCOUNTER — Encounter (HOSPITAL_BASED_OUTPATIENT_CLINIC_OR_DEPARTMENT_OTHER): Payer: Self-pay | Admitting: Physical Therapy

## 2023-02-17 DIAGNOSIS — M25552 Pain in left hip: Secondary | ICD-10-CM | POA: Diagnosis not present

## 2023-02-17 DIAGNOSIS — R2689 Other abnormalities of gait and mobility: Secondary | ICD-10-CM

## 2023-02-17 DIAGNOSIS — M25652 Stiffness of left hip, not elsewhere classified: Secondary | ICD-10-CM

## 2023-02-17 NOTE — Therapy (Signed)
OUTPATIENT PHYSICAL THERAPY TREATMENT   Patient Name: Barry Nichols MRN: [id number] DOB:April 01, 1976, 47 y.o., male Today's Date: 02/17/2023  Progress Note   Reporting Period 12/14/22 to 02/17/23   See note below for Objective Data and Assessment of Progress/Goals   END OF SESSION:  PT End of Session - 02/17/23 0718     Visit Number 10    Number of Visits 24    Date for PT Re-Evaluation 03/17/23    Authorization Type UHC    PT Start Time 0717    PT Stop Time 0757    PT Time Calculation (min) 40 min    Activity Tolerance Patient tolerated treatment well;No increased pain    Behavior During Therapy WFL for tasks assessed/performed             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, Bilateral   Migraine    "when I was young" (08/25/2016"   Posterior equinus, acquired    Bilateral   Pre-diabetes    Past Surgical History:  Procedure Laterality Date   AV FISTULA PLACEMENT Left 10/15/2016   Procedure: ARTERIOVENOUS (AV) FISTULA CREATION-LEFT ARM;  Surgeon: Fransisco Hertz, MD;  Location: [facility] OR;  Service: Vascular;  Laterality: Left;   DECOMPRESSION HIP-CORE Left 12/07/2022   Procedure: LEFT HIP CORE DECOMPRESSION WITH ILIAC CREST BONE MARROW ASPIRATE;  Surgeon: Huel Cote, MD;  Location: [facility] OR;  Service: Orthopedics;  Laterality: Left;   INSERTION OF DIALYSIS CATHETER Right 10/15/2016   Procedure: INSERTION OF DIALYSIS CATHETER;  Surgeon: Fransisco Hertz, MD;  Location: [facility] OR;  Service: Vascular;  Laterality: Right;   KIDNEY TRANSPLANT  06/15/2020   [facility] TOOTH  EXTRACTION     Patient Active Problem List   Diagnosis Date Noted   Avascular necrosis of bone of left hip (HCC) 12/07/2022   Hypoglycemia 08/25/2016   Pain in joint, ankle and foot 08/19/2012   Hypertension    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 Patient stating about a 6/10 in terms of function. About 60% improvement. Patient states remaining deficit is in functional strength/mobility, endurance, and gait mechanics.     PERTINENT HISTORY: Left hip avascular necrosis, CHF, DM2, stage IV CKD, history of gout, metatarsal deformity,  PAIN:  Are you having pain? Yes: NPRS scale: 0/10  Pain location: aching  Pain description: aching  Aggravating factors: bumping it  Relieving factors: rest  PRECAUTIONS: Other: TDWB core decompression   RED FLAGS: None   WEIGHT BEARING RESTRICTIONS: Yes =TDWB   FALLS:  Has patient fallen in last 6 months? No  LIVING ENVIRONMENT: 2 step into the house     OCCUPATION:  Not working   Hobbies:  Multimedia programmer, going to the gym    PLOF: Independent  PATIENT GOALS:   To return to normal function/ Avoiding a limp    NEXT MD VISIT:   OBJECTIVE:   DIAGNOSTIC FINDINGS:  Nothing post op    PATIENT SURVEYS:  FOTO     COGNITION: Overall cognitive status: Within functional limits for tasks assessed                         SENSATION: WFL        POSTURE: No Significant postural limitations   PALPATION: No unexpended tenderness to palpation    LOWER EXTREMITY ROM:   PROM Right eval Left eval Left 01/13/23  Hip flexion   No significant pain not pushed to end range  [facility]  Hip extension       Hip abduction       Hip adduction       Hip internal rotation   5 degrees no significant pain  27  Hip external rotation   20 degrees not pushed to end range. No significant pain 24  Knee flexion       Knee extension       Ankle dorsiflexion       Ankle plantarflexion       Ankle inversion       Ankle eversion        (Blank rows = not tested)   LOWER EXTREMITY MMT:   MMT Right eval Left eval R 01/13/23 L 01/13/23 L 02/17/23  Hip flexion     5 4+ 5  Hip extension     5 4+ 4  Hip abduction     5 4+ 4+  Hip adduction         Hip internal rotation     5 5 5   Hip external rotation     5 5 5   Knee flexion     5 5 5   Knee extension     5 5 5   Ankle dorsiflexion     5 5   Ankle plantarflexion         Ankle inversion         Ankle eversion          (Blank rows = not tested)  TODAY'S TREATMENT:                                                                                                                              02/12/23 Upright bike level 10 5 minutes Leg extension machine 70# 3 x 10 Hamstring curl machine 85# 3 x 10  Lunge 3 x 5 STS with RLE in  extension 2 x 10 Step up with contralateral knee drive 8 inch 2 x 10  Standing hip flexor stretch 3 x 20-30 second holds  02/12/23 Upright bike level 10 5 minutes Step up 6 inch 2 x 10 Lateral step up 6 inch 2 x 10 Shuttle 100 DL 3 x 10, 50 SL 2 x 10 Thomas hip flexor stretch 3 x 30-60 second holds Standing hip flexor stretch 3 x 20-30 second holds Lateral step down 4 inch 2 x 10    02/05/23 Manual: STM to L hip flexors Bridge 2 x 10  Prone hip extension 2 x 10  Sidelying hip abduction 2 x 10  Shuttle 100 DL 3 x 10, 50 SL 2 x 10 Lateral stepping 2 x 10  feet  01/29/23 Bike 5 minutes level 10 for dynamic warm up and conditioning Supine active hamstring stretch 10 x 10 second holds Supine piriformis stretch 10 x 10 second holds Leg press 160# 2x10 DL, 36# 1 x 10 SL, 64# 1 x 10 SL Hamstring curl single leg 40# 2 x 10  01/21/23 Elliptical 5 minutes level 8 for dynamic warm up and conditioning Leg press 175# 1x10 DL, 403# 1x 10 LLE bias, 47# 1 x 10 SL, 130# 1 x 10 Leg extension 70# 2 x 15 Shuttle 75# 3 x 15 Hip hinge 10# 2 x 10  01/13/23 Reassessment Stairs: 7 inch alternating, decreased concentric and eccentric glute strength and motor control   Lateral step down 6 inch 3 x 6 Forward step down 4 inch 2 x 6 Hip hinge with foam roll 2 x 10  01/07/23 Step up 6 inch 2 x 10 Lateral step down 6 inch 3 x 5 Lunge 2 x 10 bilateral  Shuttle leg press 125 resistance 1 x 15 DL, 75 2 x 15 SL Lateral stepping GTB 6 x 10 feet bilateral  Single leg mini squat with 3 way slide out 2 x 5 bilateral    PATIENT EDUCATION:  Education details: HEP, symptom management, POC, exercise form, and rationale of interventions.  Educated pt concerning using 1 crutch, decreasing Wb'ing, and/or resting if having increased pain and soreness with increased Wb'ing with ambulation. 02/17/23 reassessment findings, POC, HEP Person educated: Patient Education method: Explanation, demonstration, verbal cues, tactile cues, and handout Education comprehension: verbalized understanding, returned demonstration, verbal cues required, tactile cues required, and needs further education  HOME EXERCISE PROGRAM: Access Code: [email or web address] URL: [email or web address] Date: 12/15/2022 Prepared by: Lorayne Bender  Exercises - Supine Quad Set  - 1 x daily - 7 x weekly - 3 sets - 10 reps - Supine Gluteal Sets  - 1 x daily - 7 x weekly - 3 sets - 10 reps - Seated Ankle Pumps  - 1 x daily - 7 x weekly - 3 sets - 10 reps  Updated HEP: - Supine Short Arc Quad  - 2 x daily -  6-7 x weekly - 2 sets - 10 reps - Seated Long Arc Quad  - 1 x daily - 7 x weekly - 2 sets - 10 reps - Supine Heel Slide  - 1-2 x daily - 7 x weekly - 2 sets - 10 reps  12/31/22 - Step Up  - 1 x daily - 7 x weekly - 3 sets - 10 reps - Lateral Step Down  - 1 x daily - 7 x weekly - 3 sets - 10 reps - Sit to Stand with Arms Crossed  - 1 x daily - 7 x  weekly - 3 sets - 10 reps - Single Leg Balance with Clock Reach  - 1 x daily - 7 x weekly - 2 sets - 5 reps - 5 second hold  01/07/23 - Single Leg Balance with Four Way Reach and Rotation  - 1 x daily - 7 x weekly - 3 sets - 10 reps  02/02/23- Thomas Stretch on Table  - 1 x daily - 7 x weekly - 3 reps - 30 -60 second hold - Hip Flexor Stretch on Step  - 1 x daily - 7 x weekly - 3 reps - 30 second hold  ASSESSMENT:  CLINICAL IMPRESSION: Patient has met 3/3 short term goals and 1/3 long term goals with ability to complete HEP and improvement in symptoms, strength, ROM, activity tolerance, gait, balance, and functional mobility. Remaining goals not met due to continued deficits in functional strength, glute strength and motor control. Patient has made good progress toward remaining goals and is progressing very well since set back a few weeks ago. Continued with strengthening today which is tolerated well. Extending POC 2x/week for 4 weeks from today to continue to work toward goals. Patient will continue to benefit from skilled physical therapy in order to improve function and reduce impairment.     OBJECTIVE IMPAIRMENTS: Abnormal gait, decreased activity tolerance, decreased balance, difficulty walking, decreased ROM, decreased strength, and pain.   ACTIVITY LIMITATIONS: carrying, lifting, bending, sitting, standing, squatting, stairs, transfers, bed mobility, dressing, and locomotion level  PARTICIPATION LIMITATIONS: meal prep, cleaning, driving, shopping, community activity, occupation, and yard work  PERSONAL FACTORS: 1-2 comorbidities: CHF, CKD   are also affecting patient's functional outcome.   REHAB POTENTIAL: Good  CLINICAL DECISION MAKING: Stable/uncomplicated  EVALUATION COMPLEXITY: Low   GOALS: Goals reviewed with patient? Yes  SHORT TERM GOALS: Target date: 01/26/2023   Patient will demonstrate full left hip passive range of motion Baseline: Goal status: MET  2.  Patient will demonstrate 80% of right hip strength for major muscle groups Baseline:  Goal status: MET  3.  Patient will progress off crutches as tolerated. Baseline:  Goal status: MET LONG TERM GOALS: Target date: 03/09/2023    Patient will ambulate community distances without antalgic gait Baseline:  Goal status: INITIAL  2.  Patient will go up and down 8 steps with reciprocal gait pattern without pain Baseline: 01/13/23 reciprocal pattern, no pain but decreased L glute strength and motor control Goal status: MET  3.  Patient will return to exercise program Baseline:  Goal status: INITIAL    PLAN:  PT FREQUENCY: 2x/week  PT DURATION: 4 weeks  PLANNED INTERVENTIONS:  Therapeutic exercises, Therapeutic activity, Neuromuscular re-education, Balance training, Gait training, Patient/Family education, Self Care, Joint mobilization, Stair training, DME instructions, Aquatic Therapy, Dry Needling, Electrical stimulation, Cryotherapy, Moist heat, Taping, Manual therapy, and Re-evaluation.   PLAN FOR NEXT SESSION:   Consider soft tissue mobilization as needed.  Consider glutes strengthening.      Reola Mosher Esbeidy Mclaine, PT 02/17/2023, 7:49 AM

## 2023-02-24 ENCOUNTER — Ambulatory Visit (HOSPITAL_BASED_OUTPATIENT_CLINIC_OR_DEPARTMENT_OTHER): Payer: 59 | Attending: Orthopaedic Surgery | Admitting: Physical Therapy

## 2023-02-24 ENCOUNTER — Encounter (HOSPITAL_BASED_OUTPATIENT_CLINIC_OR_DEPARTMENT_OTHER): Payer: Self-pay | Admitting: Physical Therapy

## 2023-02-24 DIAGNOSIS — M25552 Pain in left hip: Secondary | ICD-10-CM | POA: Insufficient documentation

## 2023-02-24 DIAGNOSIS — M25652 Stiffness of left hip, not elsewhere classified: Secondary | ICD-10-CM | POA: Insufficient documentation

## 2023-02-24 DIAGNOSIS — R2689 Other abnormalities of gait and mobility: Secondary | ICD-10-CM | POA: Insufficient documentation

## 2023-02-24 NOTE — Therapy (Signed)
OUTPATIENT PHYSICAL THERAPY TREATMENT   Patient Name: Barry Nichols MRN: [id number] DOB:Sep 28, 1975, 47 y.o., male Today's Date: 02/24/2023   END OF SESSION:  PT End of Session - 02/24/23 0932     Visit Number 11    Number of Visits 24    Date for PT Re-Evaluation 03/17/23    Authorization Type UHC    PT Start Time 0932    PT Stop Time 1010    PT Time Calculation (min) 38 min    Activity Tolerance Patient tolerated treatment well;No increased pain    Behavior During Therapy WFL for tasks assessed/performed             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, Bilateral   Migraine    "when I was young" (08/25/2016"   Posterior equinus, acquired    Bilateral   Pre-diabetes    Past Surgical History:  Procedure Laterality Date   AV FISTULA PLACEMENT Left 10/15/2016   Procedure: ARTERIOVENOUS (AV) FISTULA CREATION-LEFT ARM;  Surgeon: Fransisco Hertz, MD;  Location: [facility] OR;  Service: Vascular;  Laterality: Left;   DECOMPRESSION HIP-CORE Left 12/07/2022   Procedure: LEFT HIP CORE DECOMPRESSION WITH ILIAC CREST BONE MARROW ASPIRATE;  Surgeon: Huel Cote, MD;  Location: [facility] OR;  Service: Orthopedics;  Laterality: Left;   INSERTION OF DIALYSIS CATHETER Right 10/15/2016   Procedure: INSERTION OF DIALYSIS CATHETER;  Surgeon: Fransisco Hertz, MD;  Location: [facility] OR;  Service: Vascular;  Laterality: Right;   KIDNEY TRANSPLANT  06/15/2020   [facility] TOOTH EXTRACTION     Patient Active Problem List   Diagnosis Date Noted   Avascular necrosis of bone of left hip (HCC) 12/07/2022    Hypoglycemia 08/25/2016   Pain in joint, ankle and foot 08/19/2012   Hypertension    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   ESRD on dialysis [facility]) 09/11/2011   Anemia 09/11/2011   Non-compliant patient 09/11/2011    XBM:[id number] Real Cons PA  REFERRING PROVIDER: Huel Cote MD   REFERRING DIAG:  Diagnosis  M87.052 (ICD-10-CM) - Avascular necrosis of bone of left hip (HCC)    THERAPY DIAG:  Pain in left hip  Stiffness of left hip, not elsewhere classified  Other abnormalities of gait and mobility  Rationale for Evaluation and Treatment: Rehabilitation  ONSET DATE: Left hip core decompression 12/07/2022  SUBJECTIVE:   SUBJECTIVE STATEMENT: Patient states stiffens up with sitting for like an hour. Patient stating about a 7/10 in terms of function. Was able to walk 3-4 miles over the weekend.     PERTINENT HISTORY: Left hip avascular necrosis, CHF, DM2, stage IV CKD, history of gout, metatarsal deformity,  PAIN:  Are you having pain? Yes: NPRS scale: 0/10  Pain location: aching  Pain description: aching  Aggravating factors: bumping it  Relieving factors: rest   PRECAUTIONS: Other: TDWB core decompression   RED FLAGS: None   WEIGHT BEARING RESTRICTIONS: Yes =TDWB   FALLS:  Has patient fallen in last  6 months? No  LIVING ENVIRONMENT: 2 step into the house     OCCUPATION:  Not working   Hobbies:  Multimedia programmer, going to the gym    PLOF: Independent  PATIENT GOALS:   To return to normal function/ Avoiding a limp    NEXT MD VISIT:   OBJECTIVE:   DIAGNOSTIC FINDINGS:  Nothing post op    PATIENT SURVEYS:  FOTO     COGNITION: Overall cognitive status: Within functional limits for tasks assessed                         SENSATION: WFL       POSTURE: No Significant postural limitations   PALPATION: No unexpended tenderness to palpation    LOWER EXTREMITY ROM:   PROM Right eval Left eval Left 01/13/23  Hip flexion   No significant  pain not pushed to end range  [facility]  Hip extension       Hip abduction       Hip adduction       Hip internal rotation   5 degrees no significant pain  27  Hip external rotation   20 degrees not pushed to end range. No significant pain 24  Knee flexion       Knee extension       Ankle dorsiflexion       Ankle plantarflexion       Ankle inversion       Ankle eversion        (Blank rows = not tested)   LOWER EXTREMITY MMT:   MMT Right eval Left eval R 01/13/23 L 01/13/23 L 02/17/23  Hip flexion     5 4+ 5  Hip extension     5 4+ 4  Hip abduction     5 4+ 4+  Hip adduction         Hip internal rotation     5 5 5   Hip external rotation     5 5 5   Knee flexion     5 5 5   Knee extension     5 5 5   Ankle dorsiflexion     5 5   Ankle plantarflexion         Ankle inversion         Ankle eversion          (Blank rows = not tested)  TODAY'S TREATMENT:                                                                                                                              02/24/23 Upright bike level 10 5 minutes Step up with contralateral knee drive 8 inch 2 x 10  Lateral step down 6 inch 1 x 10 Hip hike on edge of step 2 x 10  Lunge 2x 10 Lateral lunge 2 x 10  Stairs 5 x 12 steps 7 inch alternating pattern Split  stance hip hinge 2 x 5  Shuttle 100 DL 2 x 10, 50 SL 3 x 10  78/29/56 Upright bike level 10 5 minutes Leg extension machine 70# 3 x 10 Hamstring curl machine 85# 3 x 10  Lunge 3 x 5 STS with RLE in extension 2 x 10 Step up with contralateral knee drive 8 inch 2 x 10  Standing hip flexor stretch 3 x 20-30 second holds  02/12/23 Upright bike level 10 5 minutes Step up 6 inch 2 x 10 Lateral step up 6 inch 2 x 10 Shuttle 100 DL 3 x 10, 50 SL 2 x 10 Thomas hip flexor stretch 3 x 30-60 second holds Standing hip flexor stretch 3 x 20-30 second holds Lateral step down 4 inch 2 x 10    02/05/23 Manual: STM to L hip flexors Bridge 2 x 10  Prone hip extension 2 x 10   Sidelying hip abduction 2 x 10  Shuttle 100 DL 3 x 10, 50 SL 2 x 10 Lateral stepping 2 x 10 feet  01/29/23 Bike 5 minutes level 10 for dynamic warm up and conditioning Supine active hamstring stretch 10 x 10 second holds Supine piriformis stretch 10 x 10 second holds Leg press 160# 2x10 DL, 21# 1 x 10 SL, 30# 1 x 10 SL Hamstring curl single leg 40# 2 x 10  01/21/23 Elliptical 5 minutes level 8 for dynamic warm up and conditioning Leg press 175# 1x10 DL, 865# 1x 10 LLE bias, 78# 1 x 10 SL, 130# 1 x 10 Leg extension 70# 2 x 15 Shuttle 75# 3 x 15 Hip hinge 10# 2 x 10  01/13/23 Reassessment Stairs: 7 inch alternating, decreased concentric and eccentric glute strength and motor control   Lateral step down 6 inch 3 x 6 Forward step down 4 inch 2 x 6 Hip hinge with foam roll 2 x 10  01/07/23 Step up 6 inch 2 x 10 Lateral step down 6 inch 3 x 5 Lunge 2 x 10 bilateral  Shuttle leg press 125 resistance 1 x 15 DL, 75 2 x 15 SL Lateral stepping GTB 6 x 10 feet bilateral  Single leg mini squat with 3 way slide out 2 x 5 bilateral    PATIENT EDUCATION:  Education details: HEP, symptom management, POC, exercise form, and rationale of interventions.  Educated pt concerning using 1 crutch, decreasing Wb'ing, and/or resting if having increased pain and soreness with increased Wb'ing with ambulation. 02/17/23 reassessment findings, POC, HEP Person educated: Patient Education method: Explanation, demonstration, verbal cues, tactile cues, and handout Education comprehension: verbalized understanding, returned demonstration, verbal cues required, tactile cues required, and needs further education  HOME EXERCISE PROGRAM: Access Code: [email or web address] URL: [email or web address] Date: 12/15/2022 Prepared by: Lorayne Bender  Exercises - Supine Quad Set  - 1 x daily - 7 x weekly - 3 sets - 10 reps - Supine Gluteal Sets  - 1 x daily - 7 x weekly - 3 sets - 10 reps - Seated Ankle Pumps  - 1 x  daily - 7 x weekly - 3 sets - 10 reps  Updated HEP: - Supine Short Arc Quad  - 2 x daily - 6-7 x weekly - 2 sets - 10 reps - Seated Long Arc Quad  - 1 x daily - 7 x weekly - 2 sets - 10 reps - Supine Heel Slide  - 1-2 x daily - 7 x weekly - 2 sets - 10 reps  12/31/22 - Step Up  -  1 x daily - 7 x weekly - 3 sets - 10 reps - Lateral Step Down  - 1 x daily - 7 x weekly - 3 sets - 10 reps - Sit to Stand with Arms Crossed  - 1 x daily - 7 x weekly - 3 sets - 10 reps - Single Leg Balance with Clock Reach  - 1 x daily - 7 x weekly - 2 sets - 5 reps - 5 second hold  01/07/23 - Single Leg Balance with Four Way Reach and Rotation  - 1 x daily - 7 x weekly - 3 sets - 10 reps  02/02/23- Thomas Stretch on Table  - 1 x daily - 7 x weekly - 3 reps - 30 -60 second hold - Hip Flexor Stretch on Step  - 1 x daily - 7 x weekly - 3 reps - 30 second hold  ASSESSMENT:  CLINICAL IMPRESSION: Continued with functional strengthening which is tolerated well. Intermittent cueing for mechanics with good carry over. Patient appears to be on a good track since recent set back and is progressing well. Cueing for glute activation and push through LLE with stairs with improving mechanics following. Unable to do single leg hip hinge due to weakness/balance deficit but able to perform in split stance with quick fatigue. Patient will continue to benefit from skilled physical therapy in order to improve function and reduce impairment.     OBJECTIVE IMPAIRMENTS: Abnormal gait, decreased activity tolerance, decreased balance, difficulty walking, decreased ROM, decreased strength, and pain.   ACTIVITY LIMITATIONS: carrying, lifting, bending, sitting, standing, squatting, stairs, transfers, bed mobility, dressing, and locomotion level  PARTICIPATION LIMITATIONS: meal prep, cleaning, driving, shopping, community activity, occupation, and yard work  PERSONAL FACTORS: 1-2 comorbidities: CHF, CKD  are also affecting patient's  functional outcome.   REHAB POTENTIAL: Good  CLINICAL DECISION MAKING: Stable/uncomplicated  EVALUATION COMPLEXITY: Low   GOALS: Goals reviewed with patient? Yes  SHORT TERM GOALS: Target date: 01/26/2023   Patient will demonstrate full left hip passive range of motion Baseline: Goal status: MET  2.  Patient will demonstrate 80% of right hip strength for major muscle groups Baseline:  Goal status: MET  3.  Patient will progress off crutches as tolerated. Baseline:  Goal status: MET LONG TERM GOALS: Target date: 03/09/2023    Patient will ambulate community distances without antalgic gait Baseline:  Goal status: INITIAL  2.  Patient will go up and down 8 steps with reciprocal gait pattern without pain Baseline: 01/13/23 reciprocal pattern, no pain but decreased L glute strength and motor control Goal status: MET  3.  Patient will return to exercise program Baseline:  Goal status: INITIAL    PLAN:  PT FREQUENCY: 2x/week  PT DURATION: 4 weeks  PLANNED INTERVENTIONS:  Therapeutic exercises, Therapeutic activity, Neuromuscular re-education, Balance training, Gait training, Patient/Family education, Self Care, Joint mobilization, Stair training, DME instructions, Aquatic Therapy, Dry Needling, Electrical stimulation, Cryotherapy, Moist heat, Taping, Manual therapy, and Re-evaluation.   PLAN FOR NEXT SESSION:   Consider soft tissue mobilization as needed.  Consider glutes strengthening.      Reola Mosher Boniface Goffe, PT 02/24/2023, 9:33 AM

## 2023-03-03 ENCOUNTER — Ambulatory Visit (HOSPITAL_BASED_OUTPATIENT_CLINIC_OR_DEPARTMENT_OTHER): Payer: 59 | Admitting: Physical Therapy

## 2023-03-03 ENCOUNTER — Encounter (HOSPITAL_BASED_OUTPATIENT_CLINIC_OR_DEPARTMENT_OTHER): Payer: Self-pay | Admitting: Physical Therapy

## 2023-03-03 DIAGNOSIS — M25652 Stiffness of left hip, not elsewhere classified: Secondary | ICD-10-CM

## 2023-03-03 DIAGNOSIS — M25552 Pain in left hip: Secondary | ICD-10-CM

## 2023-03-03 DIAGNOSIS — R2689 Other abnormalities of gait and mobility: Secondary | ICD-10-CM

## 2023-03-03 NOTE — Therapy (Signed)
OUTPATIENT PHYSICAL THERAPY TREATMENT   Patient Name: Barry Nichols MRN: [id number] DOB:August 01, 1975, 47 y.o., male Today's Date: 03/03/2023   END OF SESSION:  PT End of Session - 03/03/23 1013     Visit Number 12    Number of Visits 24    Date for PT Re-Evaluation 03/17/23    Authorization Type UHC    PT Start Time 1015    PT Stop Time 1055    PT Time Calculation (min) 40 min    Activity Tolerance Patient tolerated treatment well;No increased pain    Behavior During Therapy WFL for tasks assessed/performed             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, Bilateral   Migraine    "when I was young" (08/25/2016"   Posterior equinus, acquired    Bilateral   Pre-diabetes    Past Surgical History:  Procedure Laterality Date   AV FISTULA PLACEMENT Left 10/15/2016   Procedure: ARTERIOVENOUS (AV) FISTULA CREATION-LEFT ARM;  Surgeon: Fransisco Hertz, MD;  Location: [facility] OR;  Service: Vascular;  Laterality: Left;   DECOMPRESSION HIP-CORE Left 12/07/2022   Procedure: LEFT HIP CORE DECOMPRESSION WITH ILIAC CREST BONE MARROW ASPIRATE;  Surgeon: Huel Cote, MD;  Location: [facility] OR;  Service: Orthopedics;  Laterality: Left;   INSERTION OF DIALYSIS CATHETER Right 10/15/2016   Procedure: INSERTION OF DIALYSIS CATHETER;  Surgeon: Fransisco Hertz, MD;  Location: [facility] OR;  Service: Vascular;  Laterality: Right;   KIDNEY TRANSPLANT  06/15/2020   [facility] TOOTH EXTRACTION     Patient Active Problem List   Diagnosis Date Noted   Avascular necrosis of bone of left hip (HCC) 12/07/2022    Hypoglycemia 08/25/2016   Pain in joint, ankle and foot 08/19/2012   Hypertension    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 Things are going well.     PERTINENT HISTORY: Left hip avascular necrosis, CHF, DM2, stage IV CKD, history of gout, metatarsal deformity,  PAIN:  Are you having pain? Yes: NPRS scale: 0/10  Pain location: aching  Pain description: aching  Aggravating factors: bumping it  Relieving factors: rest   PRECAUTIONS: Other: TDWB core decompression   RED FLAGS: None   WEIGHT BEARING RESTRICTIONS: Yes =TDWB   FALLS:  Has patient fallen in last 6 months? No  LIVING ENVIRONMENT: 2 step into the house     OCCUPATION:  Not  working   Hobbies:  Multimedia programmer, going to the gym    PLOF: Independent  PATIENT GOALS:   To return to normal function/ Avoiding a limp    NEXT MD VISIT:   OBJECTIVE:   DIAGNOSTIC FINDINGS:  Nothing post op    PATIENT SURVEYS:  FOTO     COGNITION: Overall cognitive status: Within functional limits for tasks assessed                         SENSATION: WFL       POSTURE: No Significant postural limitations   PALPATION: No unexpended tenderness to palpation    LOWER EXTREMITY ROM:   PROM Right eval Left eval Left 01/13/23  Hip flexion   No significant pain not pushed to end range  [facility]  Hip extension       Hip abduction       Hip  adduction       Hip internal rotation   5 degrees no significant pain  27  Hip external rotation   20 degrees not pushed to end range. No significant pain 24  Knee flexion       Knee extension       Ankle dorsiflexion       Ankle plantarflexion       Ankle inversion       Ankle eversion        (Blank rows = not tested)   LOWER EXTREMITY MMT:   MMT Right eval Left eval R 01/13/23 L 01/13/23 L 02/17/23  Hip flexion     5 4+ 5  Hip extension     5 4+ 4  Hip abduction     5 4+ 4+  Hip adduction         Hip internal rotation     5 5 5   Hip external rotation     5 5 5   Knee flexion     5 5 5   Knee extension     5 5 5   Ankle dorsiflexion     5 5   Ankle plantarflexion         Ankle inversion         Ankle eversion          (Blank rows = not tested)  TODAY'S TREATMENT:                                                                                                                              03/03/23 Elliptical 5 minutes level 7  Stairs 5 x 12 steps 7 inch alternating pattern Hip hike on edge of step 3 x 10  Lateral lunge 2 x 10  Split stance hip hinge 2 x 10 Single leg hip hinge with UE support 3 x 5 Leg extensions 65# 3 x 10 Leg press 160# 1x10 DL, 82# 3 x 10 SL Hamstring curl 100# 3 x 10  02/24/23 Upright  bike level 10 5 minutes Step up with contralateral knee drive 8 inch 2 x 10  Lateral step down 6 inch 1 x 10 Hip hike on edge of step 2 x 10  Lunge 2x 10 Lateral lunge 2 x 10  Stairs 5 x 12 steps 7 inch alternating pattern Split stance hip hinge 2 x 5  Shuttle 100 DL 2 x 10, 50 SL 3 x 10  40/98/11 Upright bike level 10 5 minutes Leg extension machine 70# 3 x 10 Hamstring curl machine 85# 3 x 10  Lunge 3 x 5 STS with RLE in extension 2 x 10 Step up with contralateral knee drive 8 inch 2 x 10  Standing hip flexor stretch 3 x 20-30 second holds  02/12/23 Upright bike level 10 5 minutes Step up 6 inch 2 x 10 Lateral step up 6 inch 2 x 10 Shuttle 100 DL 3 x 10,  50 SL 2 x 10 Thomas hip flexor stretch 3 x 30-60 second holds Standing hip flexor stretch 3 x 20-30 second holds Lateral step down 4 inch 2 x 10    02/05/23 Manual: STM to L hip flexors Bridge 2 x 10  Prone hip extension 2 x 10  Sidelying hip abduction 2 x 10  Shuttle 100 DL 3 x 10, 50 SL 2 x 10 Lateral stepping 2 x 10 feet  01/29/23 Bike 5 minutes level 10 for dynamic warm up and conditioning Supine active hamstring stretch 10 x 10 second holds Supine piriformis stretch 10 x 10 second holds Leg press 160# 2x10 DL, 91# 1 x 10 SL, 47# 1 x 10 SL Hamstring curl single leg 40# 2 x 10  01/21/23 Elliptical 5 minutes level 8 for dynamic warm up and conditioning Leg press 175# 1x10 DL, 829# 1x 10 LLE bias, 56# 1 x 10 SL, 130# 1 x 10 Leg extension 70# 2 x 15 Shuttle 75# 3 x 15 Hip hinge 10# 2 x 10  01/13/23 Reassessment Stairs: 7 inch alternating, decreased concentric and eccentric glute strength and motor control   Lateral step down 6 inch 3 x 6 Forward step down 4 inch 2 x 6 Hip hinge with foam roll 2 x 10  01/07/23 Step up 6 inch 2 x 10 Lateral step down 6 inch 3 x 5 Lunge 2 x 10 bilateral  Shuttle leg press 125 resistance 1 x 15 DL, 75 2 x 15 SL Lateral stepping GTB 6 x 10 feet bilateral  Single leg mini squat with 3 way slide out 2 x 5 bilateral    PATIENT EDUCATION:  Education details: HEP, symptom management, POC, exercise form, and rationale of interventions.  Educated pt concerning using 1 crutch, decreasing Wb'ing, and/or resting if having increased pain and soreness with increased Wb'ing with ambulation. 02/17/23 reassessment findings, POC, HEP Person educated: Patient Education method: Explanation, demonstration, verbal cues, tactile cues, and handout Education comprehension: verbalized understanding, returned demonstration, verbal cues required, tactile cues required, and needs further education  HOME EXERCISE PROGRAM: Access Code: [email or web address] URL:  [email or web address] Date: 12/15/2022 Prepared by: Lorayne Bender  Exercises - Supine Quad Set  - 1 x daily - 7 x weekly - 3 sets - 10 reps - Supine Gluteal Sets  - 1 x daily - 7 x weekly - 3 sets - 10 reps - Seated Ankle Pumps  - 1 x daily - 7 x weekly - 3 sets - 10 reps  Updated HEP: - Supine Short Arc Quad  - 2 x daily -  6-7 x weekly - 2 sets - 10 reps - Seated Long Arc Quad  - 1 x daily - 7 x weekly - 2 sets - 10 reps - Supine Heel Slide  - 1-2 x daily - 7 x weekly - 2 sets - 10 reps  12/31/22 - Step Up  - 1 x daily - 7 x weekly - 3 sets - 10 reps - Lateral Step Down  - 1 x daily - 7 x weekly - 3 sets - 10 reps - Sit to Stand with Arms Crossed  - 1 x daily - 7 x weekly - 3 sets - 10 reps - Single Leg Balance with Clock Reach  - 1 x daily - 7 x weekly - 2 sets - 5 reps - 5 second hold  01/07/23 - Single Leg Balance with Four Way Reach and Rotation  - 1 x daily - 7 x weekly - 3 sets - 10 reps  02/02/23- Thomas Stretch on Table  - 1 x daily - 7 x weekly - 3 reps - 30 -60 second hold - Hip Flexor Stretch on Step  - 1 x daily - 7 x weekly - 3 reps - 30 second hold  ASSESSMENT:  CLINICAL IMPRESSION: Able to progress back into higher level strengthening and elliptical. Demonstrating improving glute strength on stairs and with ambulation. Improving mechanics with previously tried exercises. Patient will continue to benefit from skilled physical therapy in order to improve function and reduce impairment.     OBJECTIVE IMPAIRMENTS: Abnormal gait, decreased activity tolerance, decreased balance, difficulty walking, decreased ROM, decreased strength, and pain.   ACTIVITY LIMITATIONS: carrying, lifting, bending, sitting, standing, squatting, stairs, transfers, bed mobility, dressing, and locomotion level  PARTICIPATION LIMITATIONS: meal prep, cleaning, driving, shopping, community activity, occupation, and yard work  PERSONAL FACTORS: 1-2 comorbidities: CHF, CKD  are also  affecting patient's functional outcome.   REHAB POTENTIAL: Good  CLINICAL DECISION MAKING: Stable/uncomplicated  EVALUATION COMPLEXITY: Low   GOALS: Goals reviewed with patient? Yes  SHORT TERM GOALS: Target date: 01/26/2023   Patient will demonstrate full left hip passive range of motion Baseline: Goal status: MET  2.  Patient will demonstrate 80% of right hip strength for major muscle groups Baseline:  Goal status: MET  3.  Patient will progress off crutches as tolerated. Baseline:  Goal status: MET LONG TERM GOALS: Target date: 03/09/2023    Patient will ambulate community distances without antalgic gait Baseline:  Goal status: INITIAL  2.  Patient will go up and down 8 steps with reciprocal gait pattern without pain Baseline: 01/13/23 reciprocal pattern, no pain but decreased L glute strength and motor control Goal status: MET  3.  Patient will return to exercise program Baseline:  Goal status: INITIAL    PLAN:  PT FREQUENCY: 2x/week  PT DURATION: 4 weeks  PLANNED INTERVENTIONS:  Therapeutic exercises, Therapeutic activity, Neuromuscular re-education, Balance training, Gait training, Patient/Family education, Self Care, Joint mobilization, Stair training, DME instructions, Aquatic Therapy, Dry Needling, Electrical stimulation, Cryotherapy, Moist heat, Taping, Manual therapy, and Re-evaluation.   PLAN FOR NEXT SESSION:   Consider soft tissue mobilization as needed.  Consider glutes strengthening.      Reola Mosher Wanda Cellucci, PT 03/03/2023, 10:14 AM

## 2023-03-10 ENCOUNTER — Encounter (HOSPITAL_BASED_OUTPATIENT_CLINIC_OR_DEPARTMENT_OTHER): Payer: Self-pay | Admitting: Physical Therapy

## 2023-03-10 ENCOUNTER — Ambulatory Visit (HOSPITAL_BASED_OUTPATIENT_CLINIC_OR_DEPARTMENT_OTHER): Payer: 59 | Admitting: Physical Therapy

## 2023-03-10 DIAGNOSIS — M25652 Stiffness of left hip, not elsewhere classified: Secondary | ICD-10-CM

## 2023-03-10 DIAGNOSIS — M25552 Pain in left hip: Secondary | ICD-10-CM

## 2023-03-10 DIAGNOSIS — R2689 Other abnormalities of gait and mobility: Secondary | ICD-10-CM

## 2023-03-10 NOTE — Therapy (Signed)
OUTPATIENT PHYSICAL THERAPY TREATMENT   Patient Name: Barry Nichols MRN: [id number] DOB:May 17, 1975, 47 y.o., male Today's Date: 03/10/2023 PHYSICAL THERAPY DISCHARGE SUMMARY  Visits from Start of Care: 13  Current functional level related to goals / functional outcomes: See below   Remaining deficits: See below   Education / Equipment: See below   Patient agrees to discharge. Patient goals were met. Patient is being discharged due to meeting the stated rehab goals.  Progress Note   Reporting Period 02/17/23 to 03/10/23   See note below for Objective Data and Assessment of Progress/Goals   END OF SESSION:  PT End of Session - 03/10/23 0717     Visit Number 13    Number of Visits 24    Date for PT Re-Evaluation 03/17/23    Authorization Type U[employer]    PT Start Time 0716    PT Stop Time 0740    PT Time Calculation (min) 24 min    Activity Tolerance Patient tolerated treatment well;No increased pain    Behavior During Therapy WFL for tasks assessed/performed             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, Bilateral   Migraine    "when I was young" (08/25/2016"   Posterior equinus, acquired    Bilateral   Pre-diabetes    Past Surgical History:  Procedure Laterality Date   AV FISTULA PLACEMENT Left 10/15/2016   Procedure: ARTERIOVENOUS (AV) FISTULA CREATION-LEFT ARM;  Surgeon: Fransisco Hertz, MD;  Location: [facility] OR;  Service: Vascular;  Laterality: Left;   DECOMPRESSION HIP-CORE Left 12/07/2022   Procedure: LEFT HIP CORE DECOMPRESSION WITH ILIAC CREST BONE MARROW ASPIRATE;  Surgeon:  Huel Cote, MD;  Location: [facility] OR;  Service: Orthopedics;  Laterality: Left;   INSERTION OF DIALYSIS CATHETER Right 10/15/2016   Procedure: INSERTION OF DIALYSIS CATHETER;  Surgeon: Fransisco Hertz, MD;  Location: [facility] OR;  Service: Vascular;  Laterality: Right;   KIDNEY TRANSPLANT  06/15/2020   [facility] TOOTH EXTRACTION     Patient Active Problem List   Diagnosis Date Noted   Avascular necrosis of bone of left hip (HCC) 12/07/2022   Hypoglycemia 08/25/2016   Pain in joint, ankle and foot 08/19/2012   Hypertension    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   ESRD on dialysis [facility]) 09/11/2011   Anemia 09/11/2011   Non-compliant patient 09/11/2011    [id number] Real Cons PA  REFERRING PROVIDER: Huel Cote MD   REFERRING DIAG:  Diagnosis  M87.052 (ICD-10-CM) - Avascular necrosis of bone of left hip (HCC)    THERAPY DIAG:  Pain in left hip  Stiffness of left hip, not elsewhere classified  Other abnormalities of gait and mobility  Rationale for Evaluation and Treatment: Rehabilitation  ONSET DATE: Left hip core decompression 12/07/2022  SUBJECTIVE:   SUBJECTIVE STATEMENT: Patient states had a good week. Noticed his hip once or twice in the morning. Had to walk up a hill and it was uneven  but it did alright. He states function at 8/10. Feels that he is ready to transition to HEP.     PERTINENT HISTORY: Left hip avascular necrosis, CHF, DM2, stage IV CKD, history of gout, metatarsal deformity,  PAIN:  Are you having pain? Yes: NPRS scale: 0/10  Pain location: aching  Pain description: aching  Aggravating factors: bumping it  Relieving factors: rest   PRECAUTIONS: Other: TDWB core decompression   RED FLAGS: None   WEIGHT BEARING RESTRICTIONS: Yes =TDWB   FALLS:  Has patient fallen in last 6 months? No  LIVING ENVIRONMENT: 2 step into the house     OCCUPATION:  Not working   Hobbies:  Multimedia programmer, going to the gym     PLOF: Independent  PATIENT GOALS:   To return to normal function/ Avoiding a limp    NEXT MD VISIT:   OBJECTIVE:   DIAGNOSTIC FINDINGS:  Nothing post op    PATIENT SURVEYS:  FOTO     COGNITION: Overall cognitive status: Within functional limits for tasks assessed                         SENSATION: WFL       POSTURE: No Significant postural limitations   PALPATION: No unexpended tenderness to palpation    LOWER EXTREMITY ROM:   PROM Right eval Left eval Left 01/13/23  Hip flexion   No significant pain not pushed to end range  [facility]  Hip extension       Hip abduction       Hip adduction       Hip internal rotation   5 degrees no significant pain  27  Hip external rotation   20 degrees not pushed to end range. No significant pain 24  Knee flexion       Knee extension       Ankle dorsiflexion       Ankle plantarflexion       Ankle inversion       Ankle eversion        (Blank rows = not tested)   LOWER EXTREMITY MMT:   MMT Right eval Left eval R 01/13/23 L 01/13/23 L 02/17/23 L 03/10/23  Hip flexion     5 4+ 5 5  Hip extension     5 4+ 4 5  Hip abduction     5 4+ 4+ 5  Hip adduction          Hip internal rotation     5 5 5    Hip external rotation     5 5 5    Knee flexion     5 5 5    Knee extension     5 5 5    Ankle dorsiflexion     5 5    Ankle plantarflexion          Ankle inversion          Ankle eversion           (Blank rows = not tested)  TODAY'S TREATMENT:  03/10/23 Reassessment Stairs: 7 inch alternating pattern - good mechanics  03/03/23 Elliptical 5 minutes level 7  Stairs 5 x 12 steps 7 inch alternating pattern Hip hike on edge of step 3 x 10  Lateral lunge 2 x 10  Split stance hip hinge 2 x 10 Single leg hip hinge with UE support 3 x 5 Leg extensions 65# 3 x 10 Leg press 160# 1x10 DL, 16# 3 x 10 SL Hamstring  curl 100# 3 x 10  02/24/23 Upright bike level 10 5 minutes Step up with contralateral knee drive 8 inch 2 x 10  Lateral step down 6 inch 1 x 10 Hip hike on edge of step 2 x 10  Lunge 2x 10 Lateral lunge 2 x 10  Stairs 5 x 12 steps 7 inch alternating pattern Split stance hip hinge 2 x 5  Shuttle 100 DL 2 x 10, 50 SL 3 x 10  10/96/04 Upright bike level 10 5 minutes Leg extension machine 70# 3 x 10 Hamstring curl machine 85# 3 x 10  Lunge 3 x 5 STS with RLE in extension 2 x 10 Step up with contralateral knee drive 8 inch 2 x 10  Standing hip flexor stretch 3 x 20-30 second holds  02/12/23 Upright bike level 10 5 minutes Step up 6 inch 2 x 10 Lateral step up 6 inch 2 x 10 Shuttle 100 DL 3 x 10, 50 SL 2 x 10 Thomas hip flexor stretch 3 x 30-60 second holds Standing hip flexor stretch 3 x 20-30 second holds Lateral step down 4 inch 2 x 10    02/05/23 Manual: STM to L hip flexors Bridge 2 x 10  Prone hip extension 2 x 10  Sidelying hip abduction 2 x 10  Shuttle 100 DL 3 x 10, 50 SL 2 x 10 Lateral stepping 2 x 10 feet  01/29/23 Bike 5 minutes level 10 for dynamic warm up and conditioning Supine active hamstring stretch 10 x 10 second holds Supine piriformis stretch 10 x 10 second holds Leg press 160# 2x10 DL, 54# 1 x 10 SL, 09# 1 x 10 SL Hamstring curl single leg 40# 2 x 10  01/21/23 Elliptical 5 minutes level 8 for dynamic warm up and conditioning Leg press 175# 1x10 DL, 811# 1x 10 LLE bias, 91# 1 x 10 SL, 130# 1 x 10 Leg extension 70# 2 x 15 Shuttle 75# 3 x 15 Hip hinge 10# 2 x 10  01/13/23 Reassessment Stairs: 7 inch alternating, decreased concentric and eccentric glute strength and motor control   Lateral step down 6 inch 3 x 6 Forward step down 4 inch 2 x 6 Hip hinge with foam roll 2 x 10  01/07/23 Step up 6 inch 2 x 10 Lateral step down 6 inch 3 x 5 Lunge 2 x 10 bilateral  Shuttle leg press 125 resistance 1 x 15 DL, 75 2 x 15 SL Lateral stepping GTB 6 x 10  feet bilateral  Single leg mini squat with 3 way slide out 2 x 5 bilateral    PATIENT EDUCATION:  Education details: HEP, symptom management, POC, exercise form, and rationale of interventions.  Educated pt concerning using 1 crutch, decreasing Wb'ing, and/or resting if having increased pain and soreness with increased Wb'ing with ambulation. 02/17/23 reassessment findings, POC, HEP 03/10/23: reassessment, HEP Person educated: Patient Education method: Explanation, demonstration, verbal cues, tactile cues, and handout Education comprehension: verbalized understanding, returned demonstration, verbal cues required, tactile cues required, and needs further  education  HOME EXERCISE PROGRAM: Access Code: [email or web address] URL: [email or web address] Date: 03/10/2023 - updated Prepared by: Greig Castilla Constantina Laseter  Exercises - Step Up  - 1 x daily - 7 x weekly - 3 sets - 10 reps - Lateral Step Down  - 1 x daily - 7 x weekly - 3 sets - 10 reps - Sit to Stand with Arms Crossed  - 1 x daily - 7 x weekly - 3 sets - 10 reps - Single Leg Balance with Four Way Reach and Rotation  - 1 x daily - 7 x weekly - 3 sets - 10 reps - Thomas Stretch on Table  - 1 x daily - 7 x weekly - 3 reps - 30 -60 second hold - Hip Flexor Stretch on Step  - 1 x daily - 7 x weekly - 3 reps - 30 second hold - Lunge with Counter Support  - 1 x daily - 7 x weekly - 3 sets - 10 reps - Lateral Lunge  - 1 x daily - 7 x weekly - 3 sets - 10 reps - Forward T with Counter Support  - 1 x daily - 7 x weekly - 3 sets - 10 reps - Side Stepping with Resistance at Ankles  - 1 x daily - 7 x weekly - 3 sets - 10 reps - Prone Hip Extension  - 1 x daily - 7 x weekly - 3 sets - 10 reps  ASSESSMENT:  CLINICAL IMPRESSION: Patient has met all short term goals and all long term goals with ability to complete HEP and improvement in symptoms, strength, ROM, activity tolerance, gait, balance, and functional mobility. Patient overall doing great and  feels ready to transition to HEP. Reviewed HEP with patient and updated it. Educated on returning to PT if needed. Patient discharged from PT at this time.     OBJECTIVE IMPAIRMENTS: Abnormal gait, decreased activity tolerance, decreased balance, difficulty walking, decreased ROM, decreased strength, and pain.   ACTIVITY LIMITATIONS: carrying, lifting, bending, sitting, standing, squatting, stairs, transfers, bed mobility, dressing, and locomotion level  PARTICIPATION LIMITATIONS: meal prep, cleaning, driving, shopping, community activity, occupation, and yard work  PERSONAL FACTORS: 1-2 comorbidities: CHF, CKD  are also affecting patient's functional outcome.   REHAB POTENTIAL: Good  CLINICAL DECISION MAKING: Stable/uncomplicated  EVALUATION COMPLEXITY: Low   GOALS: Goals reviewed with patient? Yes  SHORT TERM GOALS: Target date: 01/26/2023   Patient will demonstrate full left hip passive range of motion Baseline: Goal status: MET  2.  Patient will demonstrate 80% of right hip strength for major muscle groups Baseline:  Goal status: MET  3.  Patient will progress off crutches as tolerated. Baseline:  Goal status: MET LONG TERM GOALS: Target date: 03/09/2023    Patient will ambulate community distances without antalgic gait Baseline:  Goal status: MET  2.  Patient will go up and down 8 steps with reciprocal gait pattern without pain Baseline: 01/13/23 reciprocal pattern, no pain but decreased L glute strength and motor control Goal status: MET  3.  Patient will return to exercise program Baseline:  Goal status: MET    PLAN:  PT FREQUENCY: 2x/week  PT DURATION: 4 weeks  PLANNED INTERVENTIONS:  Therapeutic exercises, Therapeutic activity, Neuromuscular re-education, Balance training, Gait training, Patient/Family education, Self Care, Joint mobilization, Stair training, DME instructions, Aquatic Therapy, Dry Needling, Electrical stimulation, Cryotherapy, Moist  heat, Taping, Manual therapy, and Re-evaluation.   PLAN FOR NEXT SESSION:  n/a    Wyman Songster, PT  03/10/2023, 7:44 AM

## 2023-03-17 ENCOUNTER — Encounter (HOSPITAL_BASED_OUTPATIENT_CLINIC_OR_DEPARTMENT_OTHER): Payer: Medicare Other | Admitting: Physical Therapy

## 2023-05-12 ENCOUNTER — Inpatient Hospital Stay (HOSPITAL_BASED_OUTPATIENT_CLINIC_OR_DEPARTMENT_OTHER)
Admission: EM | Admit: 2023-05-12 | Discharge: 2023-05-18 | DRG: 698 | Disposition: A | Discharge status: Home / Self Care | Payer: Medicare Other | Attending: Internal Medicine | Admitting: Internal Medicine

## 2023-05-12 ENCOUNTER — Other Ambulatory Visit: Payer: Self-pay

## 2023-05-12 ENCOUNTER — Emergency Department (HOSPITAL_BASED_OUTPATIENT_CLINIC_OR_DEPARTMENT_OTHER): Payer: Medicare Other

## 2023-05-12 ENCOUNTER — Encounter (HOSPITAL_BASED_OUTPATIENT_CLINIC_OR_DEPARTMENT_OTHER): Payer: Self-pay

## 2023-05-12 DIAGNOSIS — M109 Gout, unspecified: Secondary | ICD-10-CM | POA: Diagnosis present

## 2023-05-12 DIAGNOSIS — Z79624 Long term (current) use of inhibitors of nucleotide synthesis: Secondary | ICD-10-CM | POA: Diagnosis not present

## 2023-05-12 DIAGNOSIS — E876 Hypokalemia: Secondary | ICD-10-CM | POA: Diagnosis present

## 2023-05-12 DIAGNOSIS — I10 Essential (primary) hypertension: Secondary | ICD-10-CM | POA: Diagnosis present

## 2023-05-12 DIAGNOSIS — Z1152 Encounter for screening for COVID-19: Secondary | ICD-10-CM

## 2023-05-12 DIAGNOSIS — Z7985 Long-term (current) use of injectable non-insulin antidiabetic drugs: Secondary | ICD-10-CM | POA: Diagnosis not present

## 2023-05-12 DIAGNOSIS — Z833 Family history of diabetes mellitus: Secondary | ICD-10-CM

## 2023-05-12 DIAGNOSIS — Z940 Kidney transplant status: Secondary | ICD-10-CM

## 2023-05-12 DIAGNOSIS — Z7984 Long term (current) use of oral hypoglycemic drugs: Secondary | ICD-10-CM

## 2023-05-12 DIAGNOSIS — Z801 Family history of malignant neoplasm of trachea, bronchus and lung: Secondary | ICD-10-CM

## 2023-05-12 DIAGNOSIS — N170 Acute kidney failure with tubular necrosis: Secondary | ICD-10-CM | POA: Diagnosis present

## 2023-05-12 DIAGNOSIS — D649 Anemia, unspecified: Secondary | ICD-10-CM | POA: Diagnosis present

## 2023-05-12 DIAGNOSIS — G4700 Insomnia, unspecified: Secondary | ICD-10-CM | POA: Diagnosis present

## 2023-05-12 DIAGNOSIS — N189 Chronic kidney disease, unspecified: Secondary | ICD-10-CM | POA: Diagnosis not present

## 2023-05-12 DIAGNOSIS — E872 Acidosis, unspecified: Secondary | ICD-10-CM | POA: Diagnosis present

## 2023-05-12 DIAGNOSIS — Y830 Surgical operation with transplant of whole organ as the cause of abnormal reaction of the patient, or of later complication, without mention of misadventure at the time of the procedure: Secondary | ICD-10-CM | POA: Diagnosis present

## 2023-05-12 DIAGNOSIS — N1330 Unspecified hydronephrosis: Secondary | ICD-10-CM | POA: Diagnosis present

## 2023-05-12 DIAGNOSIS — N179 Acute kidney failure, unspecified: Secondary | ICD-10-CM | POA: Diagnosis not present

## 2023-05-12 DIAGNOSIS — Z79621 Long term (current) use of calcineurin inhibitor: Secondary | ICD-10-CM

## 2023-05-12 DIAGNOSIS — T8619 Other complication of kidney transplant: Secondary | ICD-10-CM | POA: Diagnosis present

## 2023-05-12 DIAGNOSIS — Z6834 Body mass index (BMI) 34.0-34.9, adult: Secondary | ICD-10-CM

## 2023-05-12 DIAGNOSIS — E1129 Type 2 diabetes mellitus with other diabetic kidney complication: Secondary | ICD-10-CM | POA: Insufficient documentation

## 2023-05-12 DIAGNOSIS — Z823 Family history of stroke: Secondary | ICD-10-CM | POA: Diagnosis not present

## 2023-05-12 DIAGNOSIS — Z7982 Long term (current) use of aspirin: Secondary | ICD-10-CM | POA: Diagnosis not present

## 2023-05-12 DIAGNOSIS — Z794 Long term (current) use of insulin: Secondary | ICD-10-CM

## 2023-05-12 DIAGNOSIS — Z79899 Other long term (current) drug therapy: Secondary | ICD-10-CM

## 2023-05-12 DIAGNOSIS — Z8249 Family history of ischemic heart disease and other diseases of the circulatory system: Secondary | ICD-10-CM | POA: Diagnosis not present

## 2023-05-12 DIAGNOSIS — E669 Obesity, unspecified: Secondary | ICD-10-CM | POA: Diagnosis present

## 2023-05-12 DIAGNOSIS — N184 Chronic kidney disease, stage 4 (severe): Secondary | ICD-10-CM | POA: Diagnosis present

## 2023-05-12 DIAGNOSIS — D631 Anemia in chronic kidney disease: Secondary | ICD-10-CM | POA: Diagnosis not present

## 2023-05-12 LAB — I-STAT VENOUS BLOOD GAS, ED
Acid-base deficit: 22 mmol/L — ABNORMAL HIGH (ref 0.0–2.0)
Bicarbonate: 7.2 mmol/L — ABNORMAL LOW (ref 20.0–28.0)
Calcium, Ion: 1.24 mmol/L (ref 1.15–1.40)
HCT: 26 % — ABNORMAL LOW (ref 39.0–52.0)
Hemoglobin: 8.8 g/dL — ABNORMAL LOW (ref 13.0–17.0)
O2 Saturation: 61 %
Patient temperature: 97.8
Potassium: 5.3 mmol/L — ABNORMAL HIGH (ref 3.5–5.1)
Sodium: 132 mmol/L — ABNORMAL LOW (ref 135–145)
TCO2: 8 mmol/L — ABNORMAL LOW (ref 22–32)
pCO2, Ven: 25.3 mm[Hg] — ABNORMAL LOW (ref 44–60)
pH, Ven: 7.057 — CL (ref 7.25–7.43)
pO2, Ven: 43 mm[Hg] (ref 32–45)

## 2023-05-12 LAB — CBC WITH DIFFERENTIAL/PLATELET
Abs Immature Granulocytes: 0.08 10*3/uL — ABNORMAL HIGH (ref 0.00–0.07)
Basophils Absolute: 0 10*3/uL (ref 0.0–0.1)
Basophils Relative: 0 %
Eosinophils Absolute: 0.3 10*3/uL (ref 0.0–0.5)
Eosinophils Relative: 4 %
HCT: 25 % — ABNORMAL LOW (ref 39.0–52.0)
Hemoglobin: 8.2 g/dL — ABNORMAL LOW (ref 13.0–17.0)
Immature Granulocytes: 1 %
Lymphocytes Relative: 9 %
Lymphs Abs: 0.7 10*3/uL (ref 0.7–4.0)
MCH: 28.1 pg (ref 26.0–34.0)
MCHC: 32.8 g/dL (ref 30.0–36.0)
MCV: 85.6 fL (ref 80.0–100.0)
Monocytes Absolute: 0.4 10*3/uL (ref 0.1–1.0)
Monocytes Relative: 4 %
Neutro Abs: 6.4 10*3/uL (ref 1.7–7.7)
Neutrophils Relative %: 82 %
Platelets: 307 10*3/uL (ref 150–400)
RBC: 2.92 MIL/uL — ABNORMAL LOW (ref 4.22–5.81)
RDW: 14.5 % (ref 11.5–15.5)
WBC: 7.9 10*3/uL (ref 4.0–10.5)
nRBC: 0 % (ref 0.0–0.2)

## 2023-05-12 LAB — RESP PANEL BY RT-PCR (RSV, FLU A&B, COVID)  RVPGX2
Influenza A by PCR: NEGATIVE
Influenza B by PCR: NEGATIVE
Resp Syncytial Virus by PCR: NEGATIVE
SARS Coronavirus 2 by RT PCR: NEGATIVE

## 2023-05-12 MED ORDER — SODIUM BICARBONATE 8.4 % IV SOLN
50.0000 meq | Freq: Once | INTRAVENOUS | Status: AC
  Administered 2023-05-12: 50 meq via INTRAVENOUS
  Filled 2023-05-12: qty 150
  Filled 2023-05-12: qty 200

## 2023-05-12 MED ORDER — SODIUM CHLORIDE 0.9 % IV BOLUS
1000.0000 mL | Freq: Once | INTRAVENOUS | Status: AC
  Administered 2023-05-12: 1000 mL via INTRAVENOUS

## 2023-05-12 MED ORDER — SODIUM BICARBONATE 8.4 % IV SOLN
INTRAVENOUS | Status: DC
  Filled 2023-05-12 (×6): qty 1000

## 2023-05-12 NOTE — ED Notes (Signed)
Report given to Corrie Dandy, RN at [facility]

## 2023-05-12 NOTE — ED Triage Notes (Signed)
Pt c/o shortness of breath. Family reports that pt had blood work was done today at PCP and creatinine 13.8. Family reports pt had a kidney transplant 2 years ago and is in kidney failure.

## 2023-05-12 NOTE — ED Provider Notes (Addendum)
[place] [facility] AT [facility] Provider Note   CSN: [id number] Arrival date & time: 05/12/23  1846     History  Chief Complaint  Patient presents with   Shortness of Breath   abnormal labs    Barry Nichols is a 48 y.o. male history of CKD, renal transplant done at [facility], here presenting with shortness of breath and acute renal failure.  Patient had URI symptoms for about a week.  PCP saw the patient about a week ago was put on Augmentin empirically.  Patient also tested negative for COVID and flu and RSV.  Patient still did not feel well and went back to the PCP today.  Patient was switched to doxycycline and had labs drawn.  Labs drawn earlier today showed creatinine of 17 and bicarb of 8 and BUN of 160.  Patient states that he has been having diarrhea and poor p.o. intake.  Denies any vomiting.  The history is provided by the patient.       Home Medications Prior to Admission medications   Medication Sig Start Date End Date Taking? Authorizing Provider  amLODipine (NORVASC) 10 MG tablet Take 1 tablet (10 mg total) by mouth at bedtime. 09/14/11 12/07/22  Lonia Blood, MD  aspirin EC 325 MG tablet Take 1 tablet (325 mg total) by mouth daily. Patient not taking: Reported on 11/19/2022 10/19/22   Huel Cote, MD  carvedilol (COREG) 25 MG tablet Take 1 tablet (25 mg total) by mouth 2 (two) times daily with a meal. 09/14/11 12/07/22  Lonia Blood, MD  cinacalcet (SENSIPAR) 30 MG tablet Take 30 mg by mouth daily. Patient not taking: Reported on 11/27/2022    [provider]  cloNIDine (CATAPRES) 0.3 MG tablet Take 1 tablet (0.3 mg total) by mouth 3 (three) times daily. Patient taking differently: Take 0.3 mg by mouth 2 (two) times daily. 08/27/16   Arnetha Courser, MD  fenofibrate (TRICOR) 145 MG tablet Take 145 mg by mouth daily.    [provider]  icosapent Ethyl (VASCEPA) 1 g capsule Take 2 g by mouth 2 (two) times daily.     [provider]  insulin degludec (TRESIBA) 200 UNIT/ML FlexTouch Pen Inject 30 Units into the skin at bedtime. 09/23/21   [provider]  insulin lispro (HUMALOG KWIKPEN) 200 UNIT/ML KwikPen Inject 25-40 Units into the skin 3 (three) times daily as needed (high blood sugar). 09/02/20   [provider]  MOUNJARO 15 MG/0.5ML Pen Inject 15 mg into the skin once a week.    [provider]  mycophenolate (CELLCEPT) 500 MG tablet Take 1,000 mg by mouth 2 (two) times daily.    [provider]  oxyCODONE (ROXICODONE) 5 MG immediate release tablet Take 1 tablet (5 mg total) by mouth every 4 (four) hours as needed for severe pain or breakthrough pain. 10/19/22   Huel Cote, MD  predniSONE (DELTASONE) 5 MG tablet Take 5 mg by mouth daily.    [provider]  sulfamethoxazole-trimethoprim (BACTRIM) 400-80 MG tablet Take 1 tablet by mouth every Monday, Wednesday, and Friday.    [provider]  tacrolimus (PROGRAF) 1 MG capsule Take 3 mg by mouth 2 (two) times daily.    [provider]  tadalafil (CIALIS) 20 MG tablet Take 20 mg by mouth daily as needed for erectile dysfunction.    [provider]  traZODone (DESYREL) 50 MG tablet Take 50 mg by mouth at bedtime.    [provider]      Allergies    Patient has no known allergies.    Review of Systems   Review of Systems  Respiratory:  Positive for shortness of breath.   All other systems reviewed and are negative.   Physical Exam Updated Vital Signs BP 132/73   Pulse 86   Temp (!) 97.5 F (36.4 C) (Oral)   Resp 19   Ht 5\' 10"  (1.778 m)   Wt 107.5 kg   SpO2 100%   BMI 34.01 kg/m  Physical Exam Vitals and nursing note reviewed.  Constitutional:      Comments: Dehydrated and chronically ill  HENT:     Head: Normocephalic.     Mouth/Throat:     Pharynx: Oropharynx is clear.  Eyes:     Extraocular Movements: Extraocular movements intact.     Pupils:  Pupils are equal, round, and reactive to light.  Cardiovascular:     Rate and Rhythm: Normal rate and regular rhythm.  Pulmonary:     Effort: Pulmonary effort is normal.     Breath sounds: Normal breath sounds.  Abdominal:     General: Bowel sounds are normal.     Palpations: Abdomen is soft.     Comments: Mild tenderness over the transplanted kidney in the lower pelvis area.  Musculoskeletal:        General: Normal range of motion.     Cervical back: Normal range of motion and neck supple.  Skin:    General: Skin is warm.     Capillary Refill: Capillary refill takes less than 2 seconds.  Neurological:     General: No focal deficit present.     Mental Status: He is oriented to person, place, and time.  Psychiatric:        Mood and Affect: Mood normal.        Behavior: Behavior normal.     ED Results / Procedures / Treatments   Labs (all labs ordered are listed, but only abnormal results are displayed) Labs Reviewed  CBC WITH DIFFERENTIAL/PLATELET - Abnormal; Notable for the following components:      Result Value   RBC 2.92 (*)    Hemoglobin 8.2 (*)    HCT 25.0 (*)    Abs Immature Granulocytes 0.08 (*)    All other components within normal limits  I-STAT VENOUS BLOOD GAS, ED - Abnormal; Notable for the following components:   pH, Ven 7.057 (*)    pCO2, Ven 25.3 (*)    Bicarbonate 7.2 (*)    TCO2 8 (*)    Acid-base deficit 22.0 (*)    Sodium 132 (*)    Potassium 5.3 (*)    HCT 26.0 (*)    Hemoglobin 8.8 (*)    All other components within normal limits  RESP PANEL BY RT-PCR (RSV, FLU A&B, COVID)  [id number]  COMPREHENSIVE METABOLIC PANEL    EKG None  Radiology No results found.  Procedures Procedures   Angiocath insertion Performed by: Richardean Canal  Consent: Verbal consent obtained. Risks and benefits: risks, benefits and alternatives were discussed Time out: Immediately prior to procedure a "time out" was called to verify the correct patient, procedure,  equipment, support staff and site/side marked as required.  Preparation: Patient was prepped and draped in the usual sterile fashion.  Vein Location: R antecube  Ultrasound Guided  Gauge: 20 long   Normal blood return and flush without difficulty Patient tolerance: Patient tolerated the procedure well with no immediate  complications.     CRITICAL CARE Performed by: Richardean Canal   Total critical care time: 45 minutes  Critical care time was exclusive of separately billable procedures and treating other patients.  Critical care was necessary to treat or prevent imminent or life-threatening deterioration.  Critical care was time spent personally by me on the following activities: development of treatment plan with patient and/or surrogate as well as nursing, discussions with consultants, evaluation of patient's response to treatment, examination of patient, obtaining history from patient or surrogate, ordering and performing treatments and interventions, ordering and review of laboratory studies, ordering and review of radiographic studies, pulse oximetry and re-evaluation of patient's condition.   Medications Ordered in ED Medications  sodium bicarbonate injection 50 mEq (has no administration in time range)  sodium bicarbonate 150 mEq in dextrose 5 % 1,150 mL infusion (has no administration in time range)  sodium chloride 0.9 % bolus 1,000 mL (1,000 mLs Intravenous New Bag/Given 05/12/23 2036)    ED Course/ Medical Decision Making/ A&P                                 Medical Decision Making Barry Nichols is a 48 y.o. male here presenting with acute renal failure.  Patient has been having URI symptoms for about a week and now has gastroenteritis symptoms.  Labs earlier today showed acute renal failure with a BUN of 160 and normal potassium.  Patient's previous creatinine was 1.8 in October last year.  I discussed case with Dr. Glenna Fellows from nephrology.  She reviewed the labs  and did not think patient needs emergent dialysis.  She recommends fluid resuscitation and bicarb bolus and drip.  9:38 PM I reviewed patient's labs and pH is 7.0 with CO2 of 25.  Bicarb is 8.  He has again and gap of 17.  I ordered 2 L normal saline bolus.  I also ordered 1 amp of bicarb with a bicarb drip going at 150 cc/hr. CT showed mild right hydronephrosis.  I discussed case with critical care doc, Dr. Lonzo Candy.  He reviewed the case and states that this patient is stable for stepdown as he has no pressor requirement and the acidosis should correct with bicarb drip.  I consulted hospitalist regarding admission  10:07 PM Hospitalist to admit to stepdown   Problems Addressed: AKI (acute kidney injury) [facility]): acute illness or injury Metabolic acidosis: acute illness or injury  Amount and/or Complexity of Data Reviewed Labs: ordered. Decision-making details documented in ED Course. Radiology: ordered and independent interpretation performed. Decision-making details documented in ED Course.  Risk Prescription drug management. Decision regarding hospitalization.    Final Clinical Impression(s) / ED Diagnoses Final diagnoses:  None    Rx / DC Orders ED Discharge Orders     None         Charlynne Pander, MD 05/12/23 2207    Charlynne Pander, MD 05/12/23 2209

## 2023-05-12 NOTE — Progress Notes (Signed)
Hospitalist Transfer Note:    Nursing staff, Please call [facility] Admits & Consults System-Wide number on Amion [phone]) as soon as patient's arrival, so appropriate admitting provider can evaluate the pt.   Transferring facility: [facility] Requesting provider: Dr. Silverio Lay (EDP at [facility]) Reason for transfer: admission for further evaluation and management of acute renal failure.   [name] w/ h/o renal transplant 2 years ago at [facility], who presented to [facility] ED at the recommendation of PCP for further evaluation of outpatient labs that reflected interval increase in creatinine.   The patient has been experiencing some upper respiratory infectious symptoms over the course the last week, as well as some nausea, vomiting, diarrhea over that timeframe.  For his suspected URI, he was prescribed/completed a course of Augmentin as an outpatient over the course of the last week.  Unclear if the nausea/vomiting/diarrhea started before or after the initiation of Augmentin.  Patient notes decline in oral intake over the last several days, has also developed some shortness of breath over the last few days.  He followed up with his PCP regarding the above earlier today, at which time he was started on doxycycline.  Outpatient labs by PCP performed today noted interval increase in BUN/creatinine, prompting PCP to recommend that patient present to the ED for further evaluation management thereof.  Patient continues to produce urine.  Vital signs in the ED were notable for the following: Afebrile, heart rates in the 80s to 90s; systolic blood pressures in the 130s, with respiratory rate 16-20 and oxygen saturation 100% on room air.  CMP performed at [facility] today notable for potassium 5.1, bicarbonate 8, anion gap 17, BUN 171, creatinine 17.35.  Per chart review, most recent prior creatinine was 1.94 in October 2024. I-STAT VBG showed 7.057/25.3.   Imaging notable for CT abdomen/pelvis showed some hydronephrosis,  which was felt to be anticipated in the setting of the patient's history of renal transplant.   EDP d/w on-call nephrology, Dr. Glenna Fellows who recommended IV fluids as well as bicarbonate drip, and conveyed that she will formally consult and see the patient in the morning; Dr. Glenna Fellows conveys that there is no indication for urgent overnight dialysis. Additionally, EDP d/w on-call critical care physician, Dr. Lonzo Candy, felt that patient was appropriate for admission to the hospitalist service.  Medications administered prior to transfer included the following: Bicarbonate 50 mEq IV bolus followed by initiation of sodium bicarbonate drip.  He is also received a total of 2 L NS bolus.  Subsequently, I accepted this patient for transfer for inpatient admission to a sdu bed at [facility] for further work-up and management of the above.      Barry Pigg, DO Hospitalist

## 2023-05-12 NOTE — ED Notes (Addendum)
Family refusing labs and chest xray. Family reports pt just had blood work & chest xray today so they want to wait until seen by provider before we do additional test

## 2023-05-13 ENCOUNTER — Encounter (HOSPITAL_COMMUNITY): Payer: Self-pay | Admitting: Internal Medicine

## 2023-05-13 DIAGNOSIS — D631 Anemia in chronic kidney disease: Secondary | ICD-10-CM

## 2023-05-13 DIAGNOSIS — N189 Chronic kidney disease, unspecified: Secondary | ICD-10-CM | POA: Diagnosis not present

## 2023-05-13 DIAGNOSIS — E1129 Type 2 diabetes mellitus with other diabetic kidney complication: Secondary | ICD-10-CM | POA: Diagnosis not present

## 2023-05-13 DIAGNOSIS — Z794 Long term (current) use of insulin: Secondary | ICD-10-CM | POA: Diagnosis not present

## 2023-05-13 DIAGNOSIS — E872 Acidosis, unspecified: Secondary | ICD-10-CM

## 2023-05-13 DIAGNOSIS — Z940 Kidney transplant status: Secondary | ICD-10-CM

## 2023-05-13 DIAGNOSIS — N179 Acute kidney failure, unspecified: Secondary | ICD-10-CM | POA: Diagnosis not present

## 2023-05-13 DIAGNOSIS — I10 Essential (primary) hypertension: Secondary | ICD-10-CM | POA: Insufficient documentation

## 2023-05-13 LAB — CBC WITH DIFFERENTIAL/PLATELET
Abs Immature Granulocytes: 0.07 10*3/uL (ref 0.00–0.07)
Basophils Absolute: 0 10*3/uL (ref 0.0–0.1)
Basophils Relative: 0 %
Eosinophils Absolute: 0.2 10*3/uL (ref 0.0–0.5)
Eosinophils Relative: 2 %
HCT: 24.6 % — ABNORMAL LOW (ref 39.0–52.0)
Hemoglobin: 7.7 g/dL — ABNORMAL LOW (ref 13.0–17.0)
Immature Granulocytes: 1 %
Lymphocytes Relative: 9 %
Lymphs Abs: 0.6 10*3/uL — ABNORMAL LOW (ref 0.7–4.0)
MCH: 28 pg (ref 26.0–34.0)
MCHC: 31.3 g/dL (ref 30.0–36.0)
MCV: 89.5 fL (ref 80.0–100.0)
Monocytes Absolute: 0.5 10*3/uL (ref 0.1–1.0)
Monocytes Relative: 7 %
Neutro Abs: 5.4 10*3/uL (ref 1.7–7.7)
Neutrophils Relative %: 81 %
Platelets: 283 10*3/uL (ref 150–400)
RBC: 2.75 MIL/uL — ABNORMAL LOW (ref 4.22–5.81)
RDW: 14.5 % (ref 11.5–15.5)
WBC: 6.7 10*3/uL (ref 4.0–10.5)
nRBC: 0 % (ref 0.0–0.2)

## 2023-05-13 LAB — URINALYSIS, W/ REFLEX TO CULTURE (INFECTION SUSPECTED)
Bacteria, UA: NONE SEEN
Bilirubin Urine: NEGATIVE
Glucose, UA: 150 mg/dL — AB
Ketones, ur: NEGATIVE mg/dL
Leukocytes,Ua: NEGATIVE
Nitrite: NEGATIVE
Protein, ur: 100 mg/dL — AB
RBC / HPF: >50 RBC/hpf (ref 0–5)
Specific Gravity, Urine: 1.01 (ref 1.005–1.030)
pH: 5 (ref 5.0–8.0)

## 2023-05-13 LAB — VITAMIN B12: Vitamin B-12: 337 pg/mL (ref 180–914)

## 2023-05-13 LAB — RENAL FUNCTION PANEL
Albumin: 2.7 g/dL — ABNORMAL LOW (ref 3.5–5.0)
BUN: 175 mg/dL — ABNORMAL HIGH (ref 6–20)
CO2: <7 mmol/L — ABNORMAL LOW (ref 22–32)
Calcium: 8.6 mg/dL — ABNORMAL LOW (ref 8.9–10.3)
Chloride: 107 mmol/L (ref 98–111)
Creatinine, Ser: 16.97 mg/dL — ABNORMAL HIGH (ref 0.61–1.24)
GFR, Estimated: 3 mL/min — ABNORMAL LOW (ref >60)
Glucose, Bld: 167 mg/dL — ABNORMAL HIGH (ref 70–99)
Phosphorus: 10.4 mg/dL — ABNORMAL HIGH (ref 2.5–4.6)
Potassium: 4.4 mmol/L (ref 3.5–5.1)
Sodium: 134 mmol/L — ABNORMAL LOW (ref 135–145)

## 2023-05-13 LAB — FOLATE: Folate: 5.8 ng/mL — ABNORMAL LOW (ref >5.9)

## 2023-05-13 LAB — COMPREHENSIVE METABOLIC PANEL
ALT: 59 U/L — ABNORMAL HIGH (ref 0–44)
AST: 42 U/L — ABNORMAL HIGH (ref 15–41)
Albumin: 3.1 g/dL — ABNORMAL LOW (ref 3.5–5.0)
Alkaline Phosphatase: 38 U/L (ref 38–126)
Anion gap: 17 — ABNORMAL HIGH (ref 5–15)
BUN: 171 mg/dL — ABNORMAL HIGH (ref 6–20)
CO2: 8 mmol/L — ABNORMAL LOW (ref 22–32)
Calcium: 8.9 mg/dL (ref 8.9–10.3)
Chloride: 103 mmol/L (ref 98–111)
Creatinine, Ser: 17.35 mg/dL — ABNORMAL HIGH (ref 0.61–1.24)
GFR, Estimated: 3 mL/min — ABNORMAL LOW (ref >60)
Glucose, Bld: 166 mg/dL — ABNORMAL HIGH (ref 70–99)
Potassium: 5.1 mmol/L (ref 3.5–5.1)
Sodium: 128 mmol/L — ABNORMAL LOW (ref 135–145)
Total Bilirubin: 0.6 mg/dL (ref 0.0–1.2)
Total Protein: 6.7 g/dL (ref 6.5–8.1)

## 2023-05-13 LAB — HIV ANTIBODY (ROUTINE TESTING W REFLEX): HIV Screen 4th Generation wRfx: NONREACTIVE

## 2023-05-13 LAB — LACTIC ACID, PLASMA: Lactic Acid, Venous: 0.5 mmol/L (ref 0.5–1.9)

## 2023-05-13 LAB — BLOOD GAS, VENOUS
Acid-base deficit: 18 mmol/L — ABNORMAL HIGH (ref 0.0–2.0)
Bicarbonate: 8.8 mmol/L — ABNORMAL LOW (ref 20.0–28.0)
Drawn by: 8287
O2 Saturation: 75 %
Patient temperature: 37.1
pCO2, Ven: 24 mm[Hg] — ABNORMAL LOW (ref 44–60)
pH, Ven: 7.17 — CL (ref 7.25–7.43)
pO2, Ven: 43 mm[Hg] (ref 32–45)

## 2023-05-13 LAB — CK: Total CK: 231 U/L (ref 49–397)

## 2023-05-13 LAB — GLUCOSE, CAPILLARY
Glucose-Capillary: 161 mg/dL — ABNORMAL HIGH (ref 70–99)
Glucose-Capillary: 199 mg/dL — ABNORMAL HIGH (ref 70–99)
Glucose-Capillary: 179 mg/dL — ABNORMAL HIGH (ref 70–99)
Glucose-Capillary: 171 mg/dL — ABNORMAL HIGH (ref 70–99)

## 2023-05-13 LAB — IRON AND TIBC
Iron: 189 ug/dL — ABNORMAL HIGH (ref 45–182)
Saturation Ratios: 66 % — ABNORMAL HIGH (ref 17.9–39.5)
TIBC: 288 ug/dL (ref 250–450)
UIBC: 99 ug/dL

## 2023-05-13 LAB — RETICULOCYTES
Immature Retic Fract: 10.7 % (ref 2.3–15.9)
RBC.: 2.75 MIL/uL — ABNORMAL LOW (ref 4.22–5.81)
Retic Count, Absolute: 44 10*3/uL (ref 19.0–186.0)
Retic Ct Pct: 1.6 % (ref 0.4–3.1)

## 2023-05-13 LAB — FERRITIN: Ferritin: 966 ng/mL — ABNORMAL HIGH (ref 24–336)

## 2023-05-13 LAB — ABO/RH: ABO/RH(D): O POS

## 2023-05-13 MED ORDER — MYCOPHENOLATE MOFETIL 250 MG PO CAPS
1000.0000 mg | ORAL_CAPSULE | Freq: Two times a day (BID) | ORAL | Status: DC
Start: 2023-05-13 — End: 2023-05-18
  Administered 2023-05-13 – 2023-05-18 (×11): 1000 mg via ORAL
  Filled 2023-05-13 (×12): qty 4

## 2023-05-13 MED ORDER — CLONIDINE HCL 0.1 MG PO TABS
0.3000 mg | ORAL_TABLET | Freq: Two times a day (BID) | ORAL | Status: DC
Start: 2023-05-13 — End: 2023-05-18
  Administered 2023-05-13 – 2023-05-18 (×11): 0.3 mg via ORAL
  Filled 2023-05-13 (×11): qty 3

## 2023-05-13 MED ORDER — AMLODIPINE BESYLATE 10 MG PO TABS
10.0000 mg | ORAL_TABLET | Freq: Every day | ORAL | Status: DC
Start: 2023-05-13 — End: 2023-05-18
  Administered 2023-05-13 – 2023-05-17 (×5): 10 mg via ORAL
  Filled 2023-05-13 (×5): qty 1

## 2023-05-13 MED ORDER — TACROLIMUS 1 MG PO CAPS
3.0000 mg | ORAL_CAPSULE | Freq: Every morning | ORAL | Status: DC
  Administered 2023-05-13 – 2023-05-18 (×6): 3 mg via ORAL
  Filled 2023-05-13 (×6): qty 3

## 2023-05-13 MED ORDER — CHLORHEXIDINE GLUCONATE CLOTH 2 % EX PADS
6.0000 | MEDICATED_PAD | Freq: Every day | CUTANEOUS | Status: DC
  Administered 2023-05-13 – 2023-05-15 (×3): 6 via TOPICAL

## 2023-05-13 MED ORDER — CARVEDILOL 25 MG PO TABS
25.0000 mg | ORAL_TABLET | Freq: Two times a day (BID) | ORAL | Status: DC
Start: 2023-05-13 — End: 2023-05-18
  Administered 2023-05-13 – 2023-05-18 (×11): 25 mg via ORAL
  Filled 2023-05-13 (×11): qty 1

## 2023-05-13 MED ORDER — ACETAMINOPHEN 650 MG RE SUPP: 650.0000 mg | Freq: Four times a day (QID) | RECTAL | Status: DC | PRN

## 2023-05-13 MED ORDER — ACETAMINOPHEN 325 MG PO TABS: 650.0000 mg | ORAL_TABLET | Freq: Four times a day (QID) | ORAL | Status: DC | PRN

## 2023-05-13 MED ORDER — TACROLIMUS 1 MG PO CAPS
2.0000 mg | ORAL_CAPSULE | Freq: Every evening | ORAL | Status: DC
  Administered 2023-05-13 – 2023-05-17 (×5): 2 mg via ORAL
  Filled 2023-05-13 (×7): qty 2

## 2023-05-13 MED ORDER — HEPARIN SODIUM (PORCINE) 5000 UNIT/ML IJ SOLN
5000.0000 [IU] | Freq: Three times a day (TID) | INTRAMUSCULAR | Status: DC
  Administered 2023-05-13 – 2023-05-18 (×15): 5000 [IU] via SUBCUTANEOUS
  Filled 2023-05-13 (×15): qty 1

## 2023-05-13 MED ORDER — TACROLIMUS 1 MG PO CAPS: 3.0000 mg | ORAL_CAPSULE | Freq: Two times a day (BID) | ORAL | Status: DC

## 2023-05-13 MED ORDER — INSULIN ASPART 100 UNIT/ML IJ SOLN
0.0000 [IU] | Freq: Three times a day (TID) | INTRAMUSCULAR | Status: DC
  Administered 2023-05-13 (×3): 1 [IU] via SUBCUTANEOUS
  Administered 2023-05-14 (×2): 2 [IU] via SUBCUTANEOUS
  Administered 2023-05-14: 3 [IU] via SUBCUTANEOUS
  Administered 2023-05-15: 1 [IU] via SUBCUTANEOUS
  Administered 2023-05-15: 2 [IU] via SUBCUTANEOUS
  Administered 2023-05-16: 1 [IU] via SUBCUTANEOUS
  Administered 2023-05-16 (×2): 2 [IU] via SUBCUTANEOUS
  Administered 2023-05-17: 3 [IU] via SUBCUTANEOUS
  Administered 2023-05-17: 1 [IU] via SUBCUTANEOUS
  Administered 2023-05-17 – 2023-05-18 (×2): 2 [IU] via SUBCUTANEOUS

## 2023-05-13 MED ORDER — SODIUM BICARBONATE 8.4 % IV SOLN
50.0000 meq | Freq: Once | INTRAVENOUS | Status: AC
  Administered 2023-05-13: 50 meq via INTRAVENOUS
  Filled 2023-05-13: qty 50

## 2023-05-13 MED ORDER — PREDNISONE 5 MG PO TABS
5.0000 mg | ORAL_TABLET | Freq: Every day | ORAL | Status: DC
Start: 2023-05-13 — End: 2023-05-18
  Administered 2023-05-13 – 2023-05-18 (×6): 5 mg via ORAL
  Filled 2023-05-13 (×6): qty 1

## 2023-05-13 NOTE — Progress Notes (Signed)
Date and time results received: 05/13/23 0906  Test: Venous pH Critical Value: 7.17  Name of Provider Notified: Glenna Fellows MD  Orders Received? Or Actions Taken?: Check orders for sodium bicarb injection

## 2023-05-13 NOTE — H&P (Signed)
History and Physical    Barry Nichols WGN:[id number] DOB: October 02, 1975 DOA: 05/12/2023  Patient coming from: Home.  Chief Complaint: Abnormal labs.  HPI: Barry Nichols is a 48 y.o. male with history of renal transplant in February 2022 being followed by nephrologist at [facility], diabetes mellitus type 2, hypertension was referred to the ER by primary care physician after labs showed acute worsening of patient's renal functions.  Patient states he has been feeling poorly since neither issue 3 weeks ago.  He has been having intermittent episodes of diarrhea vomiting and also had some symptoms concerning for upper respiratory infection.  Patient was placed on antibiotics (amoxicillin) by primary care physician about a week ago course of which patient completed and had presented again to the PCP yesterday and was placed on another course of antibiotics (doxycycline) and had blood drawn which showed acute renal failure.  Patient denies taking any NSAIDs and has been compliant with his antirejection medications including tacrolimus mycophenolate and prednisone and Bactrim.  He has not been eating well for last couple of weeks and has not taken his insulin for last 1 week.  Patient states he is still making urine.  Last creatinine in Care Everywhere in October 2024 was 1.9.  At that time hemoglobin was 12.3.  ED Course: In the ER labs show creatinine of 17.3 with bicarb of 8 hemoglobin of 8.2 platelets 307 with CT renal study showing mild hydronephrosis of the renal transplanted kidney and sodium was 128 VBG shows pH of 7.05.  ER physician discussed with nephrologist Dr. Glenna Fellows who advised patient to be started on bicarbonate infusion.  Patient was admitted to the hospitalist service after discussing with pulmonary critical care.  Review of Systems: As per HPI, rest all negative.   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, Bilateral   Migraine    "when I was young" (08/25/2016"   Posterior equinus, acquired    Bilateral   Pre-diabetes     Past Surgical History:  Procedure Laterality Date   AV FISTULA PLACEMENT Left 10/15/2016   Procedure: ARTERIOVENOUS (AV) FISTULA CREATION-LEFT ARM;  Surgeon: Fransisco Hertz, MD;  Location: [facility] OR;  Service: Vascular;  Laterality: Left;   DECOMPRESSION HIP-CORE Left 12/07/2022   Procedure: LEFT HIP CORE DECOMPRESSION WITH ILIAC CREST BONE MARROW ASPIRATE;  Surgeon: Huel Cote, MD;  Location: [facility] OR;  Service: Orthopedics;  Laterality: Left;   INSERTION OF DIALYSIS CATHETER Right 10/15/2016   Procedure: INSERTION OF DIALYSIS CATHETER;  Surgeon: Fransisco Hertz, MD;  Location: [facility] OR;  Service: Vascular;  Laterality: Right;   KIDNEY TRANSPLANT  06/15/2020   [facility] TOOTH EXTRACTION       reports that he has never smoked. He has never used smokeless tobacco. He reports that he does not drink alcohol and does not use drugs.  No Known Allergies  Family History  Problem Relation Age of Onset   Hypertension Father  Also maternal grandmother   Diabetes Father        also maternal grandmother   CVA Father    Cancer Maternal Grandfather        lung   Heart attack Maternal Grandfather     Prior to Admission medications   Medication Sig Start Date End Date Taking? Authorizing Provider  amLODipine (NORVASC) 10 MG tablet Take 1 tablet (10 mg total) by mouth at bedtime. 09/14/11 12/07/22  Lonia Blood, MD  aspirin EC 325 MG tablet Take 1 tablet (325 mg total) by mouth daily. Patient not taking: Reported on 11/19/2022 10/19/22    Huel Cote, MD  carvedilol (COREG) 25 MG tablet Take 1 tablet (25 mg total) by mouth 2 (two) times daily with a meal. 09/14/11 12/07/22  Lonia Blood, MD  cinacalcet (SENSIPAR) 30 MG tablet Take 30 mg by mouth daily. Patient not taking: Reported on 11/27/2022    [provider]  cloNIDine (CATAPRES) 0.3 MG tablet Take 1 tablet (0.3 mg total) by mouth 3 (three) times daily. Patient taking differently: Take 0.3 mg by mouth 2 (two) times daily. 08/27/16   Arnetha Courser, MD  fenofibrate (TRICOR) 145 MG tablet Take 145 mg by mouth daily.    [provider]  icosapent Ethyl (VASCEPA) 1 g capsule Take 2 g by mouth 2 (two) times daily.    [provider]  insulin degludec (TRESIBA) 200 UNIT/ML FlexTouch Pen Inject 30 Units into the skin at bedtime. 09/23/21   [provider]  insulin lispro (HUMALOG KWIKPEN) 200 UNIT/ML KwikPen Inject 25-40 Units into the skin 3 (three) times daily as needed (high blood sugar). 09/02/20   [provider]  MOUNJARO 15 MG/0.5ML Pen Inject 15 mg into the skin once a week.    [provider]  mycophenolate (CELLCEPT) 500 MG tablet Take 1,000 mg by mouth 2 (two) times daily.    [provider]  oxyCODONE (ROXICODONE) 5 MG immediate release tablet Take 1 tablet (5 mg total) by mouth every 4 (four) hours as needed for severe pain or breakthrough pain. 10/19/22   Huel Cote, MD  predniSONE (DELTASONE) 5 MG tablet Take 5 mg by mouth daily.    [provider]  sulfamethoxazole-trimethoprim (BACTRIM) 400-80 MG tablet Take 1 tablet by mouth every Monday, Wednesday, and Friday.    [provider]  tacrolimus (PROGRAF) 1 MG capsule Take 3 mg by mouth 2 (two) times daily.    [provider]  tadalafil (CIALIS) 20 MG tablet Take 20 mg by mouth daily as needed for erectile dysfunction.    [provider]  traZODone (DESYREL) 50 MG tablet Take 50 mg by mouth at bedtime.    [provider]    Physical Exam: Constitutional: Moderately built and nourished. Vitals:   05/12/23 2130 05/12/23 2230 05/12/23 2354 05/12/23 2358  BP: 136/87 136/84  133/70  Pulse: 92 95  100  Resp: 16 17  20   Temp:   98.3 F (36.8 C) 98.3 F (36.8 C)  TempSrc:   Oral Oral  SpO2: 100% 100%  99%  Weight:      Height:       Eyes: Anicteric no pallor. ENMT: No discharge from the ears eyes nose or mouth. Neck: No mass felt.  No neck rigidity. Respiratory: No rhonchi or crepitations. Cardiovascular: S1-S2 heard. Abdomen: Soft nontender bowel sounds present. Musculoskeletal: No edema. Skin: No rash. Neurologic: Alert awake oriented to time place and person.  Moves all  extremities. Psychiatric: Appears normal.  Normal affect.   Labs on Admission: I have personally reviewed following labs and imaging studies  CBC: Recent Labs  Lab 05/12/23 2030 05/12/23 2037  WBC 7.9  --   NEUTROABS 6.4  --   HGB 8.2* 8.8*  HCT 25.0* 26.0*  MCV 85.6  --   PLT 307  --    Basic Metabolic Panel: Recent Labs  Lab 05/12/23 2030 05/12/23 2037  NA 128* 132*  K 5.1 5.3*  CL 103  --   CO2 8*  --   GLUCOSE 166*  --   BUN 171*  --   CREATININE 17.35*  --   CALCIUM 8.9  --    GFR: Estimated Creatinine Clearance: 6.5 mL/min (A) (by C-G formula based on SCr of 17.35 mg/dL (H)). Liver Function Tests: Recent Labs  Lab 05/12/23 2030  AST 42*  ALT 59*  ALKPHOS 38  BILITOT 0.6  PROT 6.7  ALBUMIN 3.1*   No results for input(s): "LIPASE", "AMYLASE" in the last 168 hours. No results for input(s): "AMMONIA" in the last 168 hours. Coagulation Profile: No results for input(s): "INR", "PROTIME" in the last 168 hours. Cardiac Enzymes: No results for input(s): "CKTOTAL", "CKMB", "CKMBINDEX", "TROPONINI" in the last 168 hours. BNP (last 3 results) No results for input(s): "PROBNP" in the last 8760 hours. HbA1C: No results for input(s): "HGBA1C" in the last 72 hours. CBG: No results for  input(s): "GLUCAP" in the last 168 hours. Lipid Profile: No results for input(s): "CHOL", "HDL", "LDLCALC", "TRIG", "CHOLHDL", "LDLDIRECT" in the last 72 hours. Thyroid Function Tests: No results for input(s): "TSH", "T4TOTAL", "FREET4", "T3FREE", "THYROIDAB" in the last 72 hours. Anemia Panel: No results for input(s): "VITAMINB12", "FOLATE", "FERRITIN", "TIBC", "IRON", "RETICCTPCT" in the last 72 hours. Urine analysis:    Component Value Date/Time   COLORURINE STRAW (A) 08/25/2016 1222   APPEARANCEUR CLEAR 08/25/2016 1222   LABSPEC 1.006 08/25/2016 1222   PHURINE 5.0 08/25/2016 1222   GLUCOSEU NEGATIVE 08/25/2016 1222   HGBUR NEGATIVE 08/25/2016 1222   BILIRUBINUR NEGATIVE 08/25/2016 1222   BILIRUBINUR neg 09/24/2012 1059   KETONESUR NEGATIVE 08/25/2016 1222   PROTEINUR 100 (A) 08/25/2016 1222   UROBILINOGEN 0.2 09/24/2012 1059   UROBILINOGEN 0.2 09/11/2011 0843   NITRITE NEGATIVE 08/25/2016 1222   LEUKOCYTESUR NEGATIVE 08/25/2016 1222   Sepsis Labs: @LABRCNTIP (procalcitonin:4,lacticidven:4) ) Recent Results (from the past 240 hours)  Resp panel by RT-PCR (RSV, Flu A&B, Covid) Anterior Nasal Swab     Status: None   Collection Time: 05/12/23  8:08 PM   Specimen: Anterior Nasal Swab  Result Value Ref Range Status   SARS Coronavirus 2 by RT PCR NEGATIVE NEGATIVE Final    Comment: (NOTE) SARS-CoV-2 target nucleic acids are NOT DETECTED.  The SARS-CoV-2 RNA is generally detectable in upper respiratory specimens during the acute phase of infection. The lowest concentration of SARS-CoV-2 viral copies this assay can detect is 138 copies/mL. A negative result does not preclude SARS-Cov-2 infection and should not be used as the sole basis for treatment or other patient management decisions. A negative result may occur with  improper specimen collection/handling, submission of specimen other than nasopharyngeal swab, presence of viral mutation(s) within the areas targeted by this  assay, and inadequate number of viral copies(<138 copies/mL). A negative result must be combined with clinical observations, patient history, and epidemiological information. The expected result is Negative.  Fact Sheet for Patients:  BloggerCourse.com  Fact Sheet for Healthcare Providers:  SeriousBroker.it  This  test is no t yet approved or cleared by the [place] and  has been authorized for detection and/or diagnosis of SARS-CoV-2 by FDA under an Emergency Use Authorization (EUA). This EUA will remain  in effect (meaning this test can be used) for the duration of the COVID-19 declaration under Section 564(b)(1) of the Act, 21 U.S.C.section 360bbb-3(b)(1), unless the authorization is terminated  or revoked sooner.       Influenza A by PCR NEGATIVE NEGATIVE Final   Influenza B by PCR NEGATIVE NEGATIVE Final    Comment: (NOTE) The Xpert Xpress SARS-CoV-2/FLU/RSV plus assay is intended as an aid in the diagnosis of influenza from Nasopharyngeal swab specimens and should not be used as a sole basis for treatment. Nasal washings and aspirates are unacceptable for Xpert Xpress SARS-CoV-2/FLU/RSV testing.  Fact Sheet for Patients: BloggerCourse.com  Fact Sheet for Healthcare Providers: SeriousBroker.it  This test is not yet approved or cleared by the [place] FDA and has been authorized for detection and/or diagnosis of SARS-CoV-2 by FDA under an Emergency Use Authorization (EUA). This EUA will remain in effect (meaning this test can be used) for the duration of the COVID-19 declaration under Section 564(b)(1) of the Act, 21 U.S.C. section 360bbb-3(b)(1), unless the authorization is terminated or revoked.     Resp Syncytial Virus by PCR NEGATIVE NEGATIVE Final    Comment: (NOTE) Fact Sheet for Patients: BloggerCourse.com  Fact Sheet for  Healthcare Providers: SeriousBroker.it  This test is not yet approved or cleared by the [place] FDA and has been authorized for detection and/or diagnosis of SARS-CoV-2 by FDA under an Emergency Use Authorization (EUA). This EUA will remain in effect (meaning this test can be used) for the duration of the COVID-19 declaration under Section 564(b)(1) of the Act, 21 U.S.C. section 360bbb-3(b)(1), unless the authorization is terminated or revoked.  Performed at [facility], [address] [place], [place] [address]      Radiological Exams on Admission: CT Renal Stone Study Result Date: 05/12/2023 CLINICAL DATA:  Shortness of breath. History of renal transplant. Concern for renal ischemia or infarct. EXAM: CT ABDOMEN AND PELVIS WITHOUT CONTRAST TECHNIQUE: Multidetector CT imaging of the abdomen and pelvis was performed following the standard protocol without IV contrast. RADIATION DOSE REDUCTION: This exam was performed according to the departmental dose-optimization program which includes automated exposure control, adjustment of the mA and/or kV according to patient size and/or use of iterative reconstruction technique. COMPARISON:  None Available. FINDINGS: Evaluation of this exam is limited in the absence of intravenous contrast. Lower chest: The visualized lung bases are clear. There is coronary vascular calcification. No intra-abdominal free air or free fluid. Hepatobiliary: The liver is unremarkable. No biliary dilatation. The gallbladder is unremarkable. Pancreas: Unremarkable. No pancreatic ductal dilatation or surrounding inflammatory changes. Spleen: Normal in size without focal abnormality. Adrenals/Urinary Tract: The adrenal glands unremarkable. Severe atrophy of the native kidneys. A 1.8 cm indeterminate exophytic lesion from the medial right kidney is not characterized on this CT and may represent a complex or proteinaceous cyst. Further  evaluation with ultrasound on a nonemergent/outpatient basis recommended. No hydronephrosis of the native kidneys. There is a right lower quadrant renal transplant. There is mild hydronephrosis of the transplant kidney. The transplant kidney appears slightly edematous. There is stranding of the fat plane adjacent to the transplant kidney. No peritransplant fluid collection. No stone noted within the transplant kidney or ureter. The urinary bladder is unremarkable. Stomach/Bowel: Mild sigmoid diverticulosis. There  is no bowel obstruction or active inflammation. The appendix is normal. Vascular/Lymphatic: Mild atherosclerotic calcification of the abdominal aorta. The IVC is unremarkable. No portal venous gas. There is no adenopathy. Reproductive: The prostate and seminal vesicles are grossly unremarkable. No pelvic mass. Other: Small fat containing umbilical hernia. There is stranding of the periumbilical subcutaneous fat may be related to recent procedure. No fluid collection. Musculoskeletal: Avascular necrosis of the left femoral head with some cortical fragmentation. Evidence of prior left femoral neck screw. No acute osseous pathology. IMPRESSION: 1. Right lower quadrant renal transplant with mild hydronephrosis and edema of the transplant kidney. No peritransplant fluid collection. Correlation with urinalysis and further evaluation with renal transplant ultrasound and interrogation of the transplant vasculature and internal resistive indices recommended. 2. Mild sigmoid diverticulosis. No bowel obstruction. Normal appendix. 3. Avascular necrosis of the left femoral head. 4.  Aortic Atherosclerosis (ICD10-I70.0). Electronically Signed   By: Elgie Collard M.D.   On: 05/12/2023 21:02    Assessment/Plan Principal Problem:   Acute renal failure (ARF) (HCC) Active Problems:   DM (diabetes mellitus), type 2 with renal complications (HCC)   Essential hypertension   Renal transplant recipient   ARF (acute  renal failure) (HCC)    Acute renal failure in renal transplanted kidney with metabolic acidosis -   Dr. Glenna Fellows nephrologist on-call was consulted and requested patient being placed on bicarbonate infusion which is continuing.  Patient states he is still making urine.  Will closely monitor intake output metabolic panel daily weights.  Will check tacrolimus levels and for now continue with antirejection medication including mycophenolate tacrolimus prednisone awaiting formal recommendations from nephrologist.  Will hold Bactrim for now. Anemia has worsened from recent past and in October it was around 12.  Check anemia panel follow CBC.  Patient has not noticed any obvious GI bleed. Hypertension on clonidine carvedilol and amlodipine. Diabetes mellitus type 2 takes [place] 20 units which patient has not taken the last 1 week due to poor appetite.  Will keep patient on very sensitive sliding scale and closely monitor Accu-Cheks.  Given that patient has acute renal failure with metabolic acidosis will need close monitoring further management and more than 2 midnight stay and inpatient status.   DVT prophylaxis: Heparin. Code Status: Full code.  This is changed from previous. Family Communication: Discussed with patient. Disposition Plan: Monitored bed. Consults called: Nephrologist. Admission status: Inpatient.

## 2023-05-13 NOTE — Consult Note (Addendum)
[facility]  INPATIENT CONSULTATION  Reason for Consultation: Transplant AKI Requesting Provider: Dr. Benjamine Mola  HPI: Barry Nichols is an 48 y.o. male with ESRD s/p renal transplant 05/2020, HTN, gout, DM type 2, HTN, OA who is being seen for evaluation and management of AKI by nephrology.   Pt with 3 week weeks of N/V/D - watery diarrhea 3-4/d, cough, malaise, myalgias, sore throat to start.  Saw PCP about 1 week ago and rx'd amoxicillin.  Due to lack of improved was re evaluated yesterday by PCP and rx'd doxycycline.  Out labs showed AKI and was dispatched to ED.  At [facility] found to have Cr 17, bicarb 8, Hb 8.2, plt 307.  Ct renal study with mild hydro of transplanted kidney.  VBG 7.05 / 25.  Given 2L NS, 1 amp bicarb and started on bicarb gtt at 100/hr.  He's been afebrile, normo to slightly hypertensive in 130 to 150s.  Documented overnight I/Os 1.2 / 350 on floor.   Pt currently feels nausea, malaise.  Endorses feeling dry in the preceding weeks, improving with IVF.  Abd bloat.  Didn't miss any immunosuppression, No LUTs, hematuria but urine has looked darker.  No NSAIDs. Follows quarterly with [facility] [place]; transplant was at [facility] He is interested in re est with Dr. Signe Colt locally who previously followed him.   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, Bilateral   Migraine    "when I was young" (08/25/2016"   Posterior equinus, acquired    Bilateral   Pre-diabetes    PSH: Past Surgical History:  Procedure Laterality Date   AV FISTULA PLACEMENT Left 10/15/2016   Procedure: ARTERIOVENOUS (AV)  FISTULA CREATION-LEFT ARM;  Surgeon: Fransisco Hertz, MD;  Location: [facility] OR;  Service: Vascular;  Laterality: Left;   DECOMPRESSION HIP-CORE Left 12/07/2022   Procedure: LEFT HIP CORE DECOMPRESSION WITH ILIAC CREST BONE MARROW ASPIRATE;  Surgeon: Huel Cote, MD;  Location: [facility] OR;  Service: Orthopedics;  Laterality: Left;   INSERTION OF DIALYSIS CATHETER Right 10/15/2016   Procedure: INSERTION OF DIALYSIS CATHETER;  Surgeon: Fransisco Hertz, MD;  Location: [facility] OR;  Service: Vascular;  Laterality: Right;   KIDNEY TRANSPLANT  06/15/2020   [facility]   WISDOM TOOTH EXTRACTION      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  Medications Prior to Admission  Medication Sig Dispense Refill   amLODipine (NORVASC) 10 MG tablet Take 1 tablet (10 mg total) by mouth at bedtime. 30 tablet 0   aspirin EC 325 MG tablet Take 1 tablet (325 mg total) by mouth daily. (Patient not taking: Reported on 11/19/2022) 30 tablet 0   carvedilol (COREG) 25 MG tablet Take 1 tablet (25 mg total) by mouth 2 (two) times daily with a meal. 60 tablet 0   cinacalcet (SENSIPAR) 30 MG tablet Take 30 mg by mouth daily. (Patient not taking: Reported on 11/27/2022)     cloNIDine (CATAPRES) 0.3 MG tablet Take 1 tablet (0.3 mg total) by mouth 3 (three) times daily. (Patient taking differently: Take 0.3 mg by mouth 2 (two) times daily.) 90 tablet 0   fenofibrate (TRICOR)  145 MG tablet Take 145 mg by mouth daily.     icosapent Ethyl (VASCEPA) 1 g capsule Take 2 g by mouth 2 (two) times daily.     insulin degludec (TRESIBA) 200 UNIT/ML FlexTouch Pen Inject 30 Units into the skin at bedtime.     insulin lispro (HUMALOG KWIKPEN) 200 UNIT/ML KwikPen Inject 25-40 Units into the skin 3 (three) times daily as needed (high blood sugar).     MOUNJARO 15 MG/0.5ML Pen Inject 15 mg into the skin once a week.     mycophenolate (CELLCEPT) 500 MG tablet Take 1,000 mg by mouth 2 (two) times daily.     oxyCODONE (ROXICODONE) 5 MG immediate release tablet Take 1 tablet (5 mg total) by mouth every 4 (four) hours as needed for severe pain or breakthrough pain. 10 tablet 0   predniSONE (DELTASONE) 5 MG tablet Take 5 mg by mouth daily.     sulfamethoxazole-trimethoprim (BACTRIM) 400-80 MG tablet Take 1 tablet by mouth every Monday, Wednesday, and Friday.     tacrolimus (PROGRAF) 1 MG capsule Take 3 mg by mouth 2 (two) times daily.     tadalafil (CIALIS) 20 MG tablet Take 20 mg by mouth daily as needed for erectile dysfunction.     traZODone (DESYREL) 50 MG tablet Take 50 mg by mouth at bedtime.      ALLERGIES:  No Known Allergies  FAM HX: Family History  Problem Relation Age of Onset   Hypertension Father        Also maternal grandmother   Diabetes Father        also maternal grandmother   CVA Father    Cancer Maternal Grandfather        lung   Heart attack Maternal Grandfather     Social History:   reports that he has never smoked. He has never used smokeless tobacco. He reports that he does not drink alcohol and does not use drugs.  ROS: 12 system ROS neg except per HPI  Blood pressure 136/84, pulse (!) 101, temperature 98.1 F (36.7 C), temperature source Oral, resp. rate 20, height 5\' 10"  (1.778 m), weight 107.5 kg, SpO2 97%. PHYSICAL EXAM: Gen: tired but calm in bed  Eyes: anicteric, no conjunctival injection, EOMI ENT: tacky MMM, good dentition Neck: supple,  thick CV:  tachycardic, regular Abd:  soft, nontender, mildly distended, NABS, RLQ transplant nontender Lungs: clear ant, no coughing during interview GU: no foley Extr:  no edema, no joint effusions; L forearm AVF +t/b - feels useable Neuro: nonfocal Skin: no rashes noted   Results for orders placed or performed during the hospital encounter of 05/12/23 (  from the past 48 hours)  Urinalysis, w/ Reflex to Culture (Infection Suspected) -Urine, Clean Catch     Status: Abnormal   Collection Time: 05/12/23  3:12 AM  Result Value Ref Range   Specimen Source URINE, CLEAN CATCH    Color, Urine YELLOW YELLOW   APPearance CLEAR CLEAR   Specific Gravity, Urine 1.010 1.005 - 1.030   pH 5.0 5.0 - 8.0   Glucose, UA 150 (A) NEGATIVE mg/dL   Hgb urine dipstick LARGE (A) NEGATIVE   Bilirubin Urine NEGATIVE NEGATIVE   Ketones, ur NEGATIVE NEGATIVE mg/dL   Protein, ur 161 (A) NEGATIVE mg/dL   Nitrite NEGATIVE NEGATIVE   Leukocytes,Ua NEGATIVE NEGATIVE   RBC / HPF >50 0 - 5 RBC/hpf   WBC, UA 21-50 0 - 5 WBC/hpf    Comment:        Reflex urine culture not performed if WBC <=10, OR if Squamous epithelial cells >5. If Squamous epithelial cells >5 suggest recollection.    Bacteria, UA NONE SEEN NONE SEEN   Squamous Epithelial / HPF 0-5 0 - 5 /HPF    Comment: Performed at [facility], [address], [place], [place] [address]  Resp panel by RT-PCR (RSV, Flu A&B, Covid) Anterior Nasal Swab     Status: None   Collection Time: 05/12/23  8:08 PM   Specimen: Anterior Nasal Swab  Result Value Ref Range   SARS Coronavirus 2 by RT PCR NEGATIVE NEGATIVE    Comment: (NOTE) SARS-CoV-2 target nucleic acids are NOT DETECTED.  The SARS-CoV-2 RNA is generally detectable in upper respiratory specimens during the acute phase of infection. The lowest concentration of SARS-CoV-2 viral copies this assay can detect is 138 copies/mL. A negative result does not preclude SARS-Cov-2 infection and should not be  used as the sole basis for treatment or other patient management decisions. A negative result may occur with  improper specimen collection/handling, submission of specimen other than nasopharyngeal swab, presence of viral mutation(s) within the areas targeted by this assay, and inadequate number of viral copies(<138 copies/mL). A negative result must be combined with clinical observations, patient history, and epidemiological information. The expected result is Negative.  Fact Sheet for Patients:  BloggerCourse.com  Fact Sheet for Healthcare Providers:  SeriousBroker.it  This test is no t yet approved or cleared by the [place] FDA and  has been authorized for detection and/or diagnosis of SARS-CoV-2 by FDA under an Emergency Use Authorization (EUA). This EUA will remain  in effect (meaning this test can be used) for the duration of the COVID-19 declaration under Section 564(b)(1) of the Act, 21 U.S.C.section 360bbb-3(b)(1), unless the authorization is terminated  or revoked sooner.       Influenza A by PCR NEGATIVE NEGATIVE   Influenza B by PCR NEGATIVE NEGATIVE    Comment: (NOTE) The Xpert Xpress SARS-CoV-2/FLU/RSV plus assay is intended as an aid in the diagnosis of influenza from Nasopharyngeal swab specimens and should not be used as a sole basis for treatment. Nasal washings and aspirates are unacceptable for Xpert Xpress SARS-CoV-2/FLU/RSV testing.  Fact Sheet for Patients: BloggerCourse.com  Fact Sheet for Healthcare Providers: SeriousBroker.it  This test is not yet approved or cleared by the [place] FDA and has been authorized for detection and/or diagnosis of SARS-CoV-2 by FDA under an Emergency Use Authorization (EUA). This EUA will remain in effect (meaning this test can be used) for the duration of the COVID-19 declaration under Section 564(b)(1) of the  Act, 21  U.S.C. section 360bbb-3(b)(1), unless the authorization is terminated or revoked.     Resp Syncytial Virus by PCR NEGATIVE NEGATIVE    Comment: (NOTE) Fact Sheet for Patients: BloggerCourse.com  Fact Sheet for Healthcare Providers: SeriousBroker.it  This test is not yet approved or cleared by the [place] FDA and has been authorized for detection and/or diagnosis of SARS-CoV-2 by FDA under an Emergency Use Authorization (EUA). This EUA will remain in effect (meaning this test can be used) for the duration of the COVID-19 declaration under Section 564(b)(1) of the Act, 21 U.S.C. section 360bbb-3(b)(1), unless the authorization is terminated or revoked.  Performed at [facility], [address] [place], [place] [address]   CBC with Differential     Status: Abnormal   Collection Time: 05/12/23  8:30 PM  Result Value Ref Range   WBC 7.9 4.0 - 10.5 K/uL   RBC 2.92 (L) 4.22 - 5.81 MIL/uL   Hemoglobin 8.2 (L) 13.0 - 17.0 g/dL   HCT 60.4 (L) 54.0 - 98.1 %   MCV 85.6 80.0 - 100.0 fL   MCH 28.1 26.0 - 34.0 pg   MCHC 32.8 30.0 - 36.0 g/dL   RDW 19.1 47.8 - 29.5 %   Platelets 307 150 - 400 K/uL   nRBC 0.0 0.0 - 0.2 %   Neutrophils Relative % 82 %   Neutro Abs 6.4 1.7 - 7.7 K/uL   Lymphocytes Relative 9 %   Lymphs Abs 0.7 0.7 - 4.0 K/uL   Monocytes Relative 4 %   Monocytes Absolute 0.4 0.1 - 1.0 K/uL   Eosinophils Relative 4 %   Eosinophils Absolute 0.3 0.0 - 0.5 K/uL   Basophils Relative 0 %   Basophils Absolute 0.0 0.0 - 0.1 K/uL   Immature Granulocytes 1 %   Abs Immature Granulocytes 0.08 (H) 0.00 - 0.07 K/uL    Comment: Performed at [facility], [address] [place], [place] [address]  Comprehensive metabolic panel     Status: Abnormal   Collection Time: 05/12/23  8:30 PM  Result Value Ref Range   Sodium 128 (L) 135 - 145 mmol/L   Potassium 5.1 3.5 - 5.1 mmol/L   Chloride 103 98 -  111 mmol/L   CO2 8 (L) 22 - 32 mmol/L   Glucose, Bld 166 (H) 70 - 99 mg/dL    Comment: Glucose reference range applies only to samples taken after fasting for at least 8 hours.   BUN 171 (H) 6 - 20 mg/dL    Comment: RESULTS CONFIRMED BY MANUAL DILUTION   Creatinine, Ser 17.35 (H) 0.61 - 1.24 mg/dL   Calcium 8.9 8.9 - 86.5 mg/dL   Total Protein 6.7 6.5 - 8.1 g/dL   Albumin 3.1 (L) 3.5 - 5.0 g/dL   AST 42 (H) 15 - 41 U/L   ALT 59 (H) 0 - 44 U/L   Alkaline Phosphatase 38 38 - 126 U/L   Total Bilirubin 0.6 0.0 - 1.2 mg/dL   GFR, Estimated 3 (L) >60 mL/min    Comment: (NOTE) Calculated using the CKD-EPI Creatinine Equation (2021)    Anion gap 17 (H) 5 - 15    Comment: Performed at [facility], [address] [place], [place] [address]  I-Stat venous blood gas, ([facility] ED, [facility], [facility])     Status: Abnormal   Collection Time: 05/12/23  8:37 PM  Result Value Ref Range   pH, Ven 7.057 (LL) 7.25 -  7.43   pCO2, Ven 25.3 (L) 44 - 60 mmHg   pO2, Ven 43 32 - 45 mmHg   Bicarbonate 7.2 (L) 20.0 - 28.0 mmol/L   TCO2 8 (L) 22 - 32 mmol/L   O2 Saturation 61 %   Acid-base deficit 22.0 (H) 0.0 - 2.0 mmol/L   Sodium 132 (L) 135 - 145 mmol/L   Potassium 5.3 (H) 3.5 - 5.1 mmol/L   Calcium, Ion 1.24 1.15 - 1.40 mmol/L   HCT 26.0 (L) 39.0 - 52.0 %   Hemoglobin 8.8 (L) 13.0 - 17.0 g/dL   Patient temperature 16.1 F    Sample type VENOUS    Comment NOTIFIED PHYSICIAN   CBC with Differential/Platelet     Status: Abnormal   Collection Time: 05/13/23  5:39 AM  Result Value Ref Range   WBC 6.7 4.0 - 10.5 K/uL   RBC 2.75 (L) 4.22 - 5.81 MIL/uL   Hemoglobin 7.7 (L) 13.0 - 17.0 g/dL   HCT 09.6 (L) 04.5 - 40.9 %   MCV 89.5 80.0 - 100.0 fL   MCH 28.0 26.0 - 34.0 pg   MCHC 31.3 30.0 - 36.0 g/dL   RDW 81.1 91.4 - 78.2 %   Platelets 283 150 - 400 K/uL   nRBC 0.0 0.0 - 0.2 %   Neutrophils Relative % 81 %   Neutro Abs 5.4 1.7 - 7.7 K/uL   Lymphocytes Relative 9 %   Lymphs Abs 0.6 (L) 0.7 - 4.0 K/uL    Monocytes Relative 7 %   Monocytes Absolute 0.5 0.1 - 1.0 K/uL   Eosinophils Relative 2 %   Eosinophils Absolute 0.2 0.0 - 0.5 K/uL   Basophils Relative 0 %   Basophils Absolute 0.0 0.0 - 0.1 K/uL   Immature Granulocytes 1 %   Abs Immature Granulocytes 0.07 0.00 - 0.07 K/uL    Comment: Performed at [facility], [address] [place], [place] [address]    CT Renal Stone Study Result Date: 05/12/2023 CLINICAL DATA:  Shortness of breath. History of renal transplant. Concern for renal ischemia or infarct. EXAM: CT ABDOMEN AND PELVIS WITHOUT CONTRAST TECHNIQUE: Multidetector CT imaging of the abdomen and pelvis was performed following the standard protocol without IV contrast. RADIATION DOSE REDUCTION: This exam was performed according to the departmental dose-optimization program which includes automated exposure control, adjustment of the mA and/or kV according to patient size and/or use of iterative reconstruction technique. COMPARISON:  None Available. FINDINGS: Evaluation of this exam is limited in the absence of intravenous contrast. Lower chest: The visualized lung bases are clear. There is coronary vascular calcification. No intra-abdominal free air or free fluid. Hepatobiliary: The liver is unremarkable. No biliary dilatation. The gallbladder is unremarkable. Pancreas: Unremarkable. No pancreatic ductal dilatation or surrounding inflammatory changes. Spleen: Normal in size without focal abnormality. Adrenals/Urinary Tract: The adrenal glands unremarkable. Severe atrophy of the native kidneys. A 1.8 cm indeterminate exophytic lesion from the medial right kidney is not characterized on this CT and may represent a complex or proteinaceous cyst. Further evaluation with ultrasound on a nonemergent/outpatient basis recommended. No hydronephrosis of the native kidneys. There is a right lower quadrant renal transplant. There is mild hydronephrosis of the transplant kidney. The transplant kidney  appears slightly edematous. There is stranding of the fat plane adjacent to the transplant kidney. No peritransplant fluid collection. No stone noted within the transplant kidney or ureter. The urinary bladder is unremarkable. Stomach/Bowel: Mild sigmoid diverticulosis. There is no bowel obstruction  or active inflammation. The appendix is normal. Vascular/Lymphatic: Mild atherosclerotic calcification of the abdominal aorta. The IVC is unremarkable. No portal venous gas. There is no adenopathy. Reproductive: The prostate and seminal vesicles are grossly unremarkable. No pelvic mass. Other: Small fat containing umbilical hernia. There is stranding of the periumbilical subcutaneous fat may be related to recent procedure. No fluid collection. Musculoskeletal: Avascular necrosis of the left femoral head with some cortical fragmentation. Evidence of prior left femoral neck screw. No acute osseous pathology. IMPRESSION: 1. Right lower quadrant renal transplant with mild hydronephrosis and edema of the transplant kidney. No peritransplant fluid collection. Correlation with urinalysis and further evaluation with renal transplant ultrasound and interrogation of the transplant vasculature and internal resistive indices recommended. 2. Mild sigmoid diverticulosis. No bowel obstruction. Normal appendix. 3. Avascular necrosis of the left femoral head. 4.  Aortic Atherosclerosis (ICD10-I70.0). Electronically Signed   By: Elgie Collard M.D.   On: 05/12/2023 21:02    Assessment/PlanAlvin A Nichols is an 48 y.o. male with ESRD s/p renal transplant 05/2020, HTN, gout, DM type 2, HTN, OA who is being seen for evaluation and management of AKI by nephrology.   **ESRD s/p renal transplant 2022 now with severe AKI:  baseline Cr 1.9 as of 01/2023.  Several weeks of poor po intake in the setting of what appeared to be URI +N/V/D - certainly has prerenal component and possibly may have progressed to ATN.  Cont IVF (bicarb gtt).  CT  1/23 with mild transplant hydro but no clear obstruction; will do PVRs today with low threshold for foley placement.   1/22 UA many RBC, many WBC and 100 protein raising concern for other etiologies.  No missed immunosuppression by report.  If not improving have low threshhold for renal biopsy in the coming days.   Will check [place] PCR with hematuria.  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  Hopefully if HD is  needed at all it'll be temporary.  Strict I/Os, daily labs.  Tac level tomorrow AM.    **Anemia, normocytic:  no overt bleeding. Hb 11/2022 12s now in 8s and stable overnight. Plt count normal and T bili normal.  Anemia panel pending.    **AGMA: in the setting of severe AKI; on bicarb gtt after 1 amp push.  Check another VBG with lactate this AM.   **N/V/D: protracted course; stool studies ordered.   **HTN: on home meds for now, hold if BP trends down.   **DM: per primary.   Will follow closely, please reach out with concerns.    Tyler Pita 05/13/2023, 6:52 AM

## 2023-05-13 NOTE — Progress Notes (Signed)
Patient admitted after midnight, please see H&P.  Appreciate nephrology consultation. Marlin Canary DO

## 2023-05-14 DIAGNOSIS — N179 Acute kidney failure, unspecified: Secondary | ICD-10-CM | POA: Diagnosis not present

## 2023-05-14 DIAGNOSIS — E872 Acidosis, unspecified: Secondary | ICD-10-CM | POA: Diagnosis not present

## 2023-05-14 LAB — RENAL FUNCTION PANEL
Albumin: 2.6 g/dL — ABNORMAL LOW (ref 3.5–5.0)
Anion gap: 18 — ABNORMAL HIGH (ref 5–15)
BUN: 166 mg/dL — ABNORMAL HIGH (ref 6–20)
CO2: 14 mmol/L — ABNORMAL LOW (ref 22–32)
Calcium: 8.3 mg/dL — ABNORMAL LOW (ref 8.9–10.3)
Chloride: 106 mmol/L (ref 98–111)
Creatinine, Ser: 14.84 mg/dL — ABNORMAL HIGH (ref 0.61–1.24)
GFR, Estimated: 4 mL/min — ABNORMAL LOW (ref >60)
Glucose, Bld: 160 mg/dL — ABNORMAL HIGH (ref 70–99)
Phosphorus: 8.7 mg/dL — ABNORMAL HIGH (ref 2.5–4.6)
Potassium: 3.3 mmol/L — ABNORMAL LOW (ref 3.5–5.1)
Sodium: 138 mmol/L (ref 135–145)

## 2023-05-14 LAB — CBC
HCT: 21.9 % — ABNORMAL LOW (ref 39.0–52.0)
Hemoglobin: 7.3 g/dL — ABNORMAL LOW (ref 13.0–17.0)
MCH: 27.8 pg (ref 26.0–34.0)
MCHC: 33.3 g/dL (ref 30.0–36.0)
MCV: 83.3 fL (ref 80.0–100.0)
Platelets: 272 10*3/uL (ref 150–400)
RBC: 2.63 MIL/uL — ABNORMAL LOW (ref 4.22–5.81)
RDW: 14.4 % (ref 11.5–15.5)
WBC: 6.1 10*3/uL (ref 4.0–10.5)
nRBC: 0 % (ref 0.0–0.2)

## 2023-05-14 LAB — GASTROINTESTINAL PANEL BY PCR, STOOL (REPLACES STOOL CULTURE)

## 2023-05-14 LAB — GLUCOSE, CAPILLARY
Glucose-Capillary: 156 mg/dL — ABNORMAL HIGH (ref 70–99)
Glucose-Capillary: 233 mg/dL — ABNORMAL HIGH (ref 70–99)
Glucose-Capillary: 210 mg/dL — ABNORMAL HIGH (ref 70–99)
Glucose-Capillary: 259 mg/dL — ABNORMAL HIGH (ref 70–99)
Glucose-Capillary: 210 mg/dL — ABNORMAL HIGH (ref 70–99)

## 2023-05-14 LAB — URINE CULTURE: Culture: <10000 — AB

## 2023-05-14 MED ORDER — FOLIC ACID 1 MG PO TABS
1.0000 mg | ORAL_TABLET | Freq: Every day | ORAL | Status: DC
  Administered 2023-05-14 – 2023-05-18 (×5): 1 mg via ORAL
  Filled 2023-05-14 (×5): qty 1

## 2023-05-14 MED ORDER — OXYMETAZOLINE HCL 0.05 % NA SOLN
1.0000 | Freq: Two times a day (BID) | NASAL | Status: AC
  Administered 2023-05-14 – 2023-05-17 (×6): 1 via NASAL
  Filled 2023-05-14: qty 30

## 2023-05-14 MED ORDER — POTASSIUM CHLORIDE CRYS ER 20 MEQ PO TBCR
20.0000 meq | EXTENDED_RELEASE_TABLET | Freq: Once | ORAL | Status: AC
  Administered 2023-05-14: 20 meq via ORAL
  Filled 2023-05-14: qty 1

## 2023-05-14 NOTE — Progress Notes (Signed)
PROGRESS NOTE    Barry Nichols  OZD:[id number] DOB: 06/09/1975 DOA: 05/12/2023 PCP: Virgilio Belling, PA-C    Brief Narrative:  Barry Nichols is a 48 y.o. male with history of renal transplant in February 2022 being followed by nephrologist at [facility], diabetes mellitus type 2, hypertension was referred to the ER by primary care physician after labs showed acute worsening of patient's renal functions.  Patient states he has been feeling poorly since neither issue 3 weeks ago.  He has been having intermittent episodes of diarrhea vomiting and also had some symptoms concerning for upper respiratory infection.  Patient was placed on antibiotics (amoxicillin) by primary care physician about a week ago course of which patient completed and had presented again to the PCP yesterday and was placed on another course of antibiotics (doxycycline) and had blood drawn which showed acute renal failure.  Patient denies taking any NSAIDs and has been compliant with his antirejection medications including tacrolimus mycophenolate and prednisone and Bactrim.    Assessment and Plan: Acute renal failure in renal transplanted kidney with metabolic acidosis -    -renal consulted-- no indication for dialysis and hopeful he will continue trending in the right direction. - Fortunately no missed immunosuppression but if renal function stagnates does not improve consistently low threshhold for renal biopsy in the coming days.  Earliest would be Monday.  -daily labs  Anemia  -trend, transfuse for <7  Hypertension  -on clonidine, carvedilol, and amlodipine.  Diabetes mellitus type 2 takes [place] 20 units which patient has not taken the last 1 week due to poor appetite.   -SSI  Hypokalemia -replete  DVT prophylaxis: heparin injection 5,000 Units Start: 05/13/23 1400    Code Status: Full Code Family Communication:   Disposition Plan:  Level of care:  Progressive Status is: Inpatient     Consultants:  renal   Subjective: No SOB, no CP  Objective: Vitals:   05/13/23 2321 05/14/23 0302 05/14/23 0814 05/14/23 1026  BP: 125/78 (!) 140/86 (!) 148/91 (!) 154/94  Pulse: 94  95   Resp: 19 19 18    Temp: 98.4 F (36.9 C) 97.6 F (36.4 C) 97.8 F (36.6 C)   TempSrc: Oral Oral Oral   SpO2: 98% 96% 99%   Weight:      Height:        Intake/Output Summary (Last 24 hours) at 05/14/2023 1308 Last data filed at 05/14/2023 1030 Gross per 24 hour  Intake 237 ml  Output 600 ml  Net -363 ml   Filed Weights   05/12/23 1852  Weight: 107.5 kg    Examination:   General: Appearance:    Obese male in no acute distress     Lungs:      respirations unlabored  Heart:    Normal heart rate.     MS:   All extremities are intact.    Neurologic:   Awake, alert       Data Reviewed: I have personally reviewed following labs and imaging studies  CBC: Recent Labs  Lab 05/12/23 2030 05/12/23 2037 05/13/23 0539 05/14/23 0605  WBC 7.9  --  6.7 6.1  NEUTROABS 6.4  --  5.4  --   HGB 8.2* 8.8* 7.7* 7.3*  HCT 25.0* 26.0* 24.6* 21.9*  MCV 85.6  --  89.5 83.3  PLT 307  --  283 272   Basic Metabolic Panel: Recent Labs  Lab 05/12/23 2030 05/12/23 2037 05/13/23 0537 05/14/23 2595  NA 128* 132* 134* 138  K 5.1 5.3* 4.4 3.3*  CL 103  --  107 106  CO2 8*  --  <7* 14*  GLUCOSE 166*  --  167* 160*  BUN 171*  --  175* 166*  CREATININE 17.35*  --  16.97* 14.84*  CALCIUM 8.9  --  8.6* 8.3*  PHOS  --   --  10.4* 8.7*   GFR: Estimated Creatinine Clearance: 7.6 mL/min (A) (by C-G formula based on SCr of 14.84 mg/dL (H)). Liver Function Tests: Recent Labs  Lab 05/12/23 2030 05/13/23 0537 05/14/23 0605  AST 42*  --   --   ALT 59*  --   --   ALKPHOS 38  --   --   BILITOT 0.6  --   --   PROT 6.7  --   --   ALBUMIN 3.1* 2.7* 2.6*   No results for input(s): "LIPASE", "AMYLASE" in the last 168 hours. No results for input(s):  "AMMONIA" in the last 168 hours. Coagulation Profile: No results for input(s): "INR", "PROTIME" in the last 168 hours. Cardiac Enzymes: Recent Labs  Lab 05/13/23 0731  CKTOTAL 231   BNP (last 3 results) No results for input(s): "PROBNP" in the last 8760 hours. HbA1C: No results for input(s): "HGBA1C" in the last 72 hours. CBG: Recent Labs  Lab 05/13/23 1717 05/13/23 2113 05/14/23 0811 05/14/23 1043 05/14/23 1249  GLUCAP 179* 171* 156* 233* 210*   Lipid Profile: No results for input(s): "CHOL", "HDL", "LDLCALC", "TRIG", "CHOLHDL", "LDLDIRECT" in the last 72 hours. Thyroid Function Tests: No results for input(s): "TSH", "T4TOTAL", "FREET4", "T3FREE", "THYROIDAB" in the last 72 hours. Anemia Panel: Recent Labs    05/13/23 0539  VITAMINB12 337  FOLATE 5.8*  FERRITIN 966*  TIBC 288  IRON 189*  RETICCTPCT 1.6   Sepsis Labs: Recent Labs  Lab 05/13/23 0734  LATICACIDVEN 0.5    Recent Results (from the past 240 hours)  Urine Culture     Status: Abnormal   Collection Time: 05/12/23  3:12 AM   Specimen: Urine, Random  Result Value Ref Range Status   Specimen Description URINE, RANDOM  Final   Special Requests NONE Reflexed from [id number]  Final   Culture (A)  Final    <10,000 COLONIES/mL INSIGNIFICANT GROWTH Performed at [facility], [address], [place], [place] [address]    Report Status 05/14/2023 FINAL  Final  Resp panel by RT-PCR (RSV, Flu A&B, Covid) Anterior Nasal Swab     Status: None   Collection Time: 05/12/23  8:08 PM   Specimen: Anterior Nasal Swab  Result Value Ref Range Status   SARS Coronavirus 2 by RT PCR NEGATIVE NEGATIVE Final    Comment: (NOTE) SARS-CoV-2 target nucleic acids are NOT DETECTED.  The SARS-CoV-2 RNA is generally detectable in upper respiratory specimens during the acute phase of infection. The lowest concentration of SARS-CoV-2 viral copies this assay can detect is 138 copies/mL. A negative result does not preclude  SARS-Cov-2 infection and should not be used as the sole basis for treatment or other patient management decisions. A negative result may occur with  improper specimen collection/handling, submission of specimen other than nasopharyngeal swab, presence of viral mutation(s) within the areas targeted by this assay, and inadequate number of viral copies(<138 copies/mL). A negative result must be combined with clinical observations, patient history, and epidemiological information. The expected result is Negative.  Fact Sheet for Patients:  BloggerCourse.com  Fact Sheet for Healthcare Providers:  SeriousBroker.it  This test is no t yet approved or cleared by the [place] and  has been authorized for detection and/or diagnosis of SARS-CoV-2 by FDA under an Emergency Use Authorization (EUA). This EUA will remain  in effect (meaning this test can be used) for the duration of the COVID-19 declaration under Section 564(b)(1) of the Act, 21 U.S.C.section 360bbb-3(b)(1), unless the authorization is terminated  or revoked sooner.       Influenza A by PCR NEGATIVE NEGATIVE Final   Influenza B by PCR NEGATIVE NEGATIVE Final    Comment: (NOTE) The Xpert Xpress SARS-CoV-2/FLU/RSV plus assay is intended as an aid in the diagnosis of influenza from Nasopharyngeal swab specimens and should not be used as a sole basis for treatment. Nasal washings and aspirates are unacceptable for Xpert Xpress SARS-CoV-2/FLU/RSV testing.  Fact Sheet for Patients: BloggerCourse.com  Fact Sheet for Healthcare Providers: SeriousBroker.it  This test is not yet approved or cleared by the [place] FDA and has been authorized for detection and/or diagnosis of SARS-CoV-2 by FDA under an Emergency Use Authorization (EUA). This EUA will remain in effect (meaning this test can be used) for the duration of  the COVID-19 declaration under Section 564(b)(1) of the Act, 21 U.S.C. section 360bbb-3(b)(1), unless the authorization is terminated or revoked.     Resp Syncytial Virus by PCR NEGATIVE NEGATIVE Final    Comment: (NOTE) Fact Sheet for Patients: BloggerCourse.com  Fact Sheet for Healthcare Providers: SeriousBroker.it  This test is not yet approved or cleared by the [place] FDA and has been authorized for detection and/or diagnosis of SARS-CoV-2 by FDA under an Emergency Use Authorization (EUA). This EUA will remain in effect (meaning this test can be used) for the duration of the COVID-19 declaration under Section 564(b)(1) of the Act, 21 U.S.C. section 360bbb-3(b)(1), unless the authorization is terminated or revoked.  Performed at [facility], [address] [place], [place] [address]   Gastrointestinal Panel by PCR , Stool     Status: None   Collection Time: 05/13/23  7:14 PM   Specimen: Stool  Result Value Ref Range Status   Campylobacter species NOT DETECTED NOT DETECTED Final   Plesimonas shigelloides NOT DETECTED NOT DETECTED Final   Salmonella species NOT DETECTED NOT DETECTED Final   Yersinia enterocolitica NOT DETECTED NOT DETECTED Final   Vibrio species NOT DETECTED NOT DETECTED Final   Vibrio cholerae NOT DETECTED NOT DETECTED Final   Enteroaggregative E coli (EAEC) NOT DETECTED NOT DETECTED Final   Enteropathogenic E coli (EPEC) NOT DETECTED NOT DETECTED Final   Enterotoxigenic E coli (ETEC) NOT DETECTED NOT DETECTED Final   Shiga like toxin producing E coli (STEC) NOT DETECTED NOT DETECTED Final   Shigella/Enteroinvasive E coli (EIEC) NOT DETECTED NOT DETECTED Final   Cryptosporidium NOT DETECTED NOT DETECTED Final   Cyclospora cayetanensis NOT DETECTED NOT DETECTED Final   Entamoeba histolytica NOT DETECTED NOT DETECTED Final   Giardia lamblia NOT DETECTED NOT DETECTED Final   Adenovirus  F40/41 NOT DETECTED NOT DETECTED Final   Astrovirus NOT DETECTED NOT DETECTED Final   Norovirus GI/GII NOT DETECTED NOT DETECTED Final   Rotavirus A NOT DETECTED NOT DETECTED Final   Sapovirus (I, II, IV, and V) NOT DETECTED NOT DETECTED Final    Comment: Performed at [facility], [address], [place], [place] [address]         Radiology Studies: CT Renal Stone Study Result Date: 05/12/2023 CLINICAL DATA:  Shortness of breath. History of renal transplant. Concern for renal ischemia or infarct. EXAM: CT ABDOMEN AND PELVIS WITHOUT CONTRAST TECHNIQUE: Multidetector CT imaging of the abdomen and pelvis was performed following the standard protocol without IV contrast. RADIATION DOSE REDUCTION: This exam was performed according to the departmental dose-optimization program which includes automated exposure control, adjustment of the mA and/or kV according to patient size and/or use of iterative reconstruction technique. COMPARISON:  None Available. FINDINGS: Evaluation of this exam is limited in the absence of intravenous contrast. Lower chest: The visualized lung bases are clear. There is coronary vascular calcification. No intra-abdominal free air or free fluid. Hepatobiliary: The liver is unremarkable. No biliary dilatation. The gallbladder is unremarkable. Pancreas: Unremarkable. No pancreatic ductal dilatation or surrounding inflammatory changes. Spleen: Normal in size without focal abnormality. Adrenals/Urinary Tract: The adrenal glands unremarkable. Severe atrophy of the native kidneys. A 1.8 cm indeterminate exophytic lesion from the medial right kidney is not characterized on this CT and may represent a complex or proteinaceous cyst. Further evaluation with ultrasound on a nonemergent/outpatient basis recommended. No hydronephrosis of the native kidneys. There is a right lower quadrant renal transplant. There is mild hydronephrosis of the transplant kidney. The transplant kidney  appears slightly edematous. There is stranding of the fat plane adjacent to the transplant kidney. No peritransplant fluid collection. No stone noted within the transplant kidney or ureter. The urinary bladder is unremarkable. Stomach/Bowel: Mild sigmoid diverticulosis. There is no bowel obstruction or active inflammation. The appendix is normal. Vascular/Lymphatic: Mild atherosclerotic calcification of the abdominal aorta. The IVC is unremarkable. No portal venous gas. There is no adenopathy. Reproductive: The prostate and seminal vesicles are grossly unremarkable. No pelvic mass. Other: Small fat containing umbilical hernia. There is stranding of the periumbilical subcutaneous fat may be related to recent procedure. No fluid collection. Musculoskeletal: Avascular necrosis of the left femoral head with some cortical fragmentation. Evidence of prior left femoral neck screw. No acute osseous pathology. IMPRESSION: 1. Right lower quadrant renal transplant with mild hydronephrosis and edema of the transplant kidney. No peritransplant fluid collection. Correlation with urinalysis and further evaluation with renal transplant ultrasound and interrogation of the transplant vasculature and internal resistive indices recommended. 2. Mild sigmoid diverticulosis. No bowel obstruction. Normal appendix. 3. Avascular necrosis of the left femoral head. 4.  Aortic Atherosclerosis (ICD10-I70.0). Electronically Signed   By: Elgie Collard M.D.   On: 05/12/2023 21:02        Scheduled Meds:  amLODipine  10 mg Oral QHS   carvedilol  25 mg Oral BID WC   Chlorhexidine Gluconate Cloth  6 each Topical Daily   cloNIDine  0.3 mg Oral BID   heparin  5,000 Units Subcutaneous Q8H   insulin aspart  0-6 Units Subcutaneous TID WC   mycophenolate  1,000 mg Oral BID   predniSONE  5 mg Oral Q breakfast   tacrolimus  3 mg Oral q morning   And   tacrolimus  2 mg Oral QPM   Continuous Infusions:  sodium bicarbonate 150 mEq in  dextrose 5 % 1,150 mL infusion 100 mL/hr at 05/14/23 1036     LOS: 2 days    Time spent: 45 minutes spent on chart review, discussion with nursing staff, consultants, updating family and interview/physical exam; more than 50% of that time was spent in counseling and/or coordination of care.    Joseph Art, DO [facility] Available via Epic secure chat 7am-7pm After these hours, please refer to coverage provider listed  on [email or web address] 05/14/2023, 1:08 PM

## 2023-05-14 NOTE — Progress Notes (Signed)
Barry Nichols is an 48 y.o. male with ESRD s/p renal transplant 05/2020, HTN, gout, DM type 2, HTN, OA   p/w 3 week h/o N/V/D - watery diarrhea 3-4/d, cough, malaise, myalgias, sore throat to start.  Amoxicillin from PCP.  Due to lack of improved was re evaluated 1/22 by PCP and rx'd doxycycline.  Out labs showed AKI and was dispatched to ED.  At [facility] found to have Cr 17, bicarb 8, Hb 8.2, plt 307.  Ct renal study with mild hydro of transplanted kidney.  VBG 7.05 / 25.  Given 2L NS, 1 amp bicarb and started on bicarb gtt at 100/hr.  Didn't miss any immunosuppression, No LUTS, hematuria but urine has looked darker.  No NSAIDs. Follows quarterly with [facility] [place]; transplant was at [facility]. He is interested in re est with Dr. Signe Colt locally who previously followed him.   Assessment/Plan: **ESRD s/p renal transplant 2022 now with severe AKI:  baseline Cr 1.9 as of 01/2023.  Several weeks of poor po intake in the setting of what appeared to be URI +N/V/D - certainly has prerenal component and possibly may have progressed to ATN.  Cont IVF (bicarb gtt).  CT 1/23 with mild transplant hydro but no clear obstruction; will do PVRs today with low threshold for foley placement.   1/22 UA many RBC, many WBC and 100 protein raising concern for other etiologies.    Will check Parvo PCR with hematuria.  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  Hopefully if HD is  needed at all it'll be temporary.  Strict I/Os, daily labs.  Tac level 1/24 collected and in process.    -Renal function slightly improved; 950 mL of urine output recorded in the past 24 hours.  He still has some diarrhea and likely losing some urine was not recorded there as well. -Absolute indication for dialysis and hopeful he will continue trending in the right direction. - Fortunately no missed immunosuppression but if renal function stagnates does not  improve consistently low threshhold for renal biopsy in the coming days.  Earliest would be Monday.    **Anemia, normocytic:  no overt bleeding. Hb 11/2022 12s now in 7's and stable overnight. Plt count normal and T bili normal.  Anemia panel pending.     **AGMA: in the setting of severe AKI; on bicarb gtt after 1 amp push.  Check another VBG with lactate this AM.  Continue D5W plus to be as bicarb at 100 mL/h.  Patient still has diarrhea.   **N/V/D: protracted course; stool studies ordered.    **HTN: on home meds for now, hold if BP trends down.    **DM: per primary.   Subjective: Feeling better with less shortness of breath and tolerating fluids.  Continued diarrhea.  Denies fever chills nausea vomiting.   Chemistry and CBC: Creatinine, Ser  Date/Time Value Ref Range Status  05/14/2023 06:05 AM 14.84 (H) 0.61 - 1.24 mg/dL Final  19/14/7829 56:21 AM 16.97 (H) 0.61 - 1.24 mg/dL Final  30/86/5784 69:62 PM 17.35 (H) 0.61 - 1.24 mg/dL Final  95/28/4132 44:01 AM 1.74 (H) 0.61 - 1.24 mg/dL Final  02/72/5366 44:03 AM 9.43 (H) 0.61 - 1.24 mg/dL Final  47/42/5956 38:75 AM 9.05 (H) 0.61 - 1.24 mg/dL Final  64/33/2951 88:41 AM 8.86 (H) 0.61 - 1.24 mg/dL Final  66/09/3014 01:09 AM 2.63 (H) 0.50 - 1.35  mg/dL Final  16/01/9603 54:09 AM 2.71 (H) 0.50 - 1.35 mg/dL Final  81/19/1478 29:56 AM 2.60 (H) 0.50 - 1.35 mg/dL Final  21/30/8657 84:69 AM 2.76 (H) 0.50 - 1.35 mg/dL Final  62/95/2841 32:44 AM 2.70 (H) 0.50 - 1.35 mg/dL Final   Recent Labs  Lab 05/12/23 2030 05/12/23 2037 05/13/23 0537 05/14/23 0605  NA 128* 132* 134* 138  K 5.1 5.3* 4.4 3.3*  CL 103  --  107 106  CO2 8*  --  <7* 14*  GLUCOSE 166*  --  167* 160*  BUN 171*  --  175* 166*  CREATININE 17.35*  --  16.97* 14.84*  CALCIUM 8.9  --  8.6* 8.3*  PHOS  --   --  10.4* 8.7*   Recent Labs  Lab 05/12/23 2030 05/12/23 2037 05/13/23 0539 05/14/23 0605  WBC 7.9  --  6.7 6.1  NEUTROABS 6.4  --  5.4  --   HGB 8.2* 8.8* 7.7*  7.3*  HCT 25.0* 26.0* 24.6* 21.9*  MCV 85.6  --  89.5 83.3  PLT 307  --  283 272   Liver Function Tests: Recent Labs  Lab 05/12/23 2030 05/13/23 0537 05/14/23 0605  AST 42*  --   --   ALT 59*  --   --   ALKPHOS 38  --   --   BILITOT 0.6  --   --   PROT 6.7  --   --   ALBUMIN 3.1* 2.7* 2.6*   No results for input(s): "LIPASE", "AMYLASE" in the last 168 hours. No results for input(s): "AMMONIA" in the last 168 hours. Cardiac Enzymes: Recent Labs  Lab 05/13/23 0731  CKTOTAL 231   Iron Studies:  Recent Labs    05/13/23 0539  IRON 189*  TIBC 288  FERRITIN 966*   PT/INR: @LABRCNTIP (inr:5)  Xrays/Other Studies: ) Results for orders placed or performed during the hospital encounter of 05/12/23 (from the past 48 hours)  Resp panel by RT-PCR (RSV, Flu A&B, Covid) Anterior Nasal Swab     Status: None   Collection Time: 05/12/23  8:08 PM   Specimen: Anterior Nasal Swab  Result Value Ref Range   SARS Coronavirus 2 by RT PCR NEGATIVE NEGATIVE    Comment: (NOTE) SARS-CoV-2 target nucleic acids are NOT DETECTED.  The SARS-CoV-2 RNA is generally detectable in upper respiratory specimens during the acute phase of infection. The lowest concentration of SARS-CoV-2 viral copies this assay can detect is 138 copies/mL. A negative result does not preclude SARS-Cov-2 infection and should not be used as the sole basis for treatment or other patient management decisions. A negative result may occur with  improper specimen collection/handling, submission of specimen other than nasopharyngeal swab, presence of viral mutation(s) within the areas targeted by this assay, and inadequate number of viral copies(<138 copies/mL). A negative result must be combined with clinical observations, patient history, and epidemiological information. The expected result is Negative.  Fact Sheet for Patients:  BloggerCourse.com  Fact Sheet for Healthcare Providers:   SeriousBroker.it  This test is no t yet approved or cleared by the [place] FDA and  has been authorized for detection and/or diagnosis of SARS-CoV-2 by FDA under an Emergency Use Authorization (EUA). This EUA will remain  in effect (meaning this test can be used) for the duration of the COVID-19 declaration under Section 564(b)(1) of the Act, 21 U.S.C.section 360bbb-3(b)(1), unless the authorization is terminated  or revoked sooner.       Influenza A  by PCR NEGATIVE NEGATIVE   Influenza B by PCR NEGATIVE NEGATIVE    Comment: (NOTE) The Xpert Xpress SARS-CoV-2/FLU/RSV plus assay is intended as an aid in the diagnosis of influenza from Nasopharyngeal swab specimens and should not be used as a sole basis for treatment. Nasal washings and aspirates are unacceptable for Xpert Xpress SARS-CoV-2/FLU/RSV testing.  Fact Sheet for Patients: BloggerCourse.com  Fact Sheet for Healthcare Providers: SeriousBroker.it  This test is not yet approved or cleared by the [place] FDA and has been authorized for detection and/or diagnosis of SARS-CoV-2 by FDA under an Emergency Use Authorization (EUA). This EUA will remain in effect (meaning this test can be used) for the duration of the COVID-19 declaration under Section 564(b)(1) of the Act, 21 U.S.C. section 360bbb-3(b)(1), unless the authorization is terminated or revoked.     Resp Syncytial Virus by PCR NEGATIVE NEGATIVE    Comment: (NOTE) Fact Sheet for Patients: BloggerCourse.com  Fact Sheet for Healthcare Providers: SeriousBroker.it  This test is not yet approved or cleared by the [place] FDA and has been authorized for detection and/or diagnosis of SARS-CoV-2 by FDA under an Emergency Use Authorization (EUA). This EUA will remain in effect (meaning this test can be used) for the duration of  the COVID-19 declaration under Section 564(b)(1) of the Act, 21 U.S.C. section 360bbb-3(b)(1), unless the authorization is terminated or revoked.  Performed at [facility], [address] [place], [place] [address]   CBC with Differential     Status: Abnormal   Collection Time: 05/12/23  8:30 PM  Result Value Ref Range   WBC 7.9 4.0 - 10.5 K/uL   RBC 2.92 (L) 4.22 - 5.81 MIL/uL   Hemoglobin 8.2 (L) 13.0 - 17.0 g/dL   HCT 60.4 (L) 54.0 - 98.1 %   MCV 85.6 80.0 - 100.0 fL   MCH 28.1 26.0 - 34.0 pg   MCHC 32.8 30.0 - 36.0 g/dL   RDW 19.1 47.8 - 29.5 %   Platelets 307 150 - 400 K/uL   nRBC 0.0 0.0 - 0.2 %   Neutrophils Relative % 82 %   Neutro Abs 6.4 1.7 - 7.7 K/uL   Lymphocytes Relative 9 %   Lymphs Abs 0.7 0.7 - 4.0 K/uL   Monocytes Relative 4 %   Monocytes Absolute 0.4 0.1 - 1.0 K/uL   Eosinophils Relative 4 %   Eosinophils Absolute 0.3 0.0 - 0.5 K/uL   Basophils Relative 0 %   Basophils Absolute 0.0 0.0 - 0.1 K/uL   Immature Granulocytes 1 %   Abs Immature Granulocytes 0.08 (H) 0.00 - 0.07 K/uL    Comment: Performed at [facility], [address] [place], [place] [address]  Comprehensive metabolic panel     Status: Abnormal   Collection Time: 05/12/23  8:30 PM  Result Value Ref Range   Sodium 128 (L) 135 - 145 mmol/L   Potassium 5.1 3.5 - 5.1 mmol/L   Chloride 103 98 - 111 mmol/L   CO2 8 (L) 22 - 32 mmol/L   Glucose, Bld 166 (H) 70 - 99 mg/dL    Comment: Glucose reference range applies only to samples taken after fasting for at least 8 hours.   BUN 171 (H) 6 - 20 mg/dL    Comment: RESULTS CONFIRMED BY MANUAL DILUTION   Creatinine, Ser 17.35 (H) 0.61 - 1.24 mg/dL   Calcium 8.9 8.9 - 86.5 mg/dL   Total Protein 6.7 6.5 - 8.1  g/dL   Albumin 3.1 (L) 3.5 - 5.0 g/dL   AST 42 (H) 15 - 41 U/L   ALT 59 (H) 0 - 44 U/L   Alkaline Phosphatase 38 38 - 126 U/L   Total Bilirubin 0.6 0.0 - 1.2 mg/dL   GFR, Estimated 3 (L) >60 mL/min    Comment:  (NOTE) Calculated using the CKD-EPI Creatinine Equation (2021)    Anion gap 17 (H) 5 - 15    Comment: Performed at [facility], [address] [place], [place] [address]  I-Stat venous blood gas, [facility], [facility], [facility])     Status: Abnormal   Collection Time: 05/12/23  8:37 PM  Result Value Ref Range   pH, Ven 7.057 (LL) 7.25 - 7.43   pCO2, Ven 25.3 (L) 44 - 60 mmHg   pO2, Ven 43 32 - 45 mmHg   Bicarbonate 7.2 (L) 20.0 - 28.0 mmol/L   TCO2 8 (L) 22 - 32 mmol/L   O2 Saturation 61 %   Acid-base deficit 22.0 (H) 0.0 - 2.0 mmol/L   Sodium 132 (L) 135 - 145 mmol/L   Potassium 5.3 (H) 3.5 - 5.1 mmol/L   Calcium, Ion 1.24 1.15 - 1.40 mmol/L   HCT 26.0 (L) 39.0 - 52.0 %   Hemoglobin 8.8 (L) 13.0 - 17.0 g/dL   Patient temperature 30.8 F    Sample type VENOUS    Comment NOTIFIED PHYSICIAN   Renal function panel     Status: Abnormal   Collection Time: 05/13/23  5:37 AM  Result Value Ref Range   Sodium 134 (L) 135 - 145 mmol/L   Potassium 4.4 3.5 - 5.1 mmol/L   Chloride 107 98 - 111 mmol/L   CO2 <7 (L) 22 - 32 mmol/L   Glucose, Bld 167 (H) 70 - 99 mg/dL    Comment: Glucose reference range applies only to samples taken after fasting for at least 8 hours.   BUN 175 (H) 6 - 20 mg/dL   Creatinine, Ser 65.78 (H) 0.61 - 1.24 mg/dL   Calcium 8.6 (L) 8.9 - 10.3 mg/dL   Phosphorus 46.9 (H) 2.5 - 4.6 mg/dL   Albumin 2.7 (L) 3.5 - 5.0 g/dL   GFR, Estimated 3 (L) >60 mL/min    Comment: (NOTE) Calculated using the CKD-EPI Creatinine Equation (2021)    Anion gap NOT CALCULATED 5 - 15    Comment: Performed at [facility], [address] [place], [place] [address]  ABO/Rh     Status: None   Collection Time: 05/13/23  5:38 AM  Result Value Ref Range   ABO/RH(D)      O POS Performed at [facility], [address], [place], [place] [address]   CBC with Differential/Platelet     Status: Abnormal   Collection Time: 05/13/23  5:39 AM  Result Value Ref Range   WBC 6.7 4.0 -  10.5 K/uL   RBC 2.75 (L) 4.22 - 5.81 MIL/uL   Hemoglobin 7.7 (L) 13.0 - 17.0 g/dL   HCT 44.0 (L) 10.2 - 72.5 %   MCV 89.5 80.0 - 100.0 fL   MCH 28.0 26.0 - 34.0 pg   MCHC 31.3 30.0 - 36.0 g/dL   RDW 36.6 44.0 - 34.7 %   Platelets 283 150 - 400 K/uL   nRBC 0.0 0.0 - 0.2 %   Neutrophils Relative % 81 %   Neutro Abs 5.4 1.7 - 7.7 K/uL   Lymphocytes Relative  9 %   Lymphs Abs 0.6 (L) 0.7 - 4.0 K/uL   Monocytes Relative 7 %   Monocytes Absolute 0.5 0.1 - 1.0 K/uL   Eosinophils Relative 2 %   Eosinophils Absolute 0.2 0.0 - 0.5 K/uL   Basophils Relative 0 %   Basophils Absolute 0.0 0.0 - 0.1 K/uL   Immature Granulocytes 1 %   Abs Immature Granulocytes 0.07 0.00 - 0.07 K/uL    Comment: Performed at [facility], [address] [place], [place] [address]  Vitamin B12     Status: None   Collection Time: 05/13/23  5:39 AM  Result Value Ref Range   Vitamin B-12 337 180 - 914 pg/mL    Comment: (NOTE) This assay is not validated for testing neonatal or myeloproliferative syndrome specimens for Vitamin B12 levels. Performed at [facility], [address], [place], [place] [address]   Folate     Status: Abnormal   Collection Time: 05/13/23  5:39 AM  Result Value Ref Range   Folate 5.8 (L) >5.9 ng/mL    Comment: Performed at [facility], [address], [place], [place] [address]  Iron and TIBC     Status: Abnormal   Collection Time: 05/13/23  5:39 AM  Result Value Ref Range   Iron 189 (H) 45 - 182 ug/dL   TIBC 284 132 - 440 ug/dL   Saturation Ratios 66 (H) 17.9 - 39.5 %   UIBC 99 ug/dL    Comment: Performed at [facility], [address] [place], [place] [address]  Ferritin     Status: Abnormal   Collection Time: 05/13/23  5:39 AM  Result Value Ref Range   Ferritin 966 (H) 24 - 336 ng/mL    Comment: Performed at [facility], [address], [place], [place] [address]  Reticulocytes     Status: Abnormal   Collection Time: 05/13/23  5:39 AM   Result Value Ref Range   Retic Ct Pct 1.6 0.4 - 3.1 %   RBC. 2.75 (L) 4.22 - 5.81 MIL/uL   Retic Count, Absolute 44.0 19.0 - 186.0 K/uL   Immature Retic Fract 10.7 2.3 - 15.9 %    Comment: Performed at [facility], [address] [place], [place] [address]  Type and screen [facility]     Status: None   Collection Time: 05/13/23  7:30 AM  Result Value Ref Range   ABO/RH(D) O POS    Antibody Screen NEG    Sample Expiration      05/16/2023,2359 Performed at [facility], [address] [place], [place] [address]   CK     Status: None   Collection Time: 05/13/23  7:31 AM  Result Value Ref Range   Total CK 231 49 - 397 U/L    Comment: Performed at [facility], [address], [place], [place] [address]  HIV Antibody (routine testing w rflx)     Status: None   Collection Time: 05/13/23  7:34 AM  Result Value Ref Range   HIV Screen 4th Generation wRfx Non Reactive Non Reactive    Comment: Performed at [facility], [address], [place], [place] [address]  Lactic acid, plasma     Status: None   Collection Time: 05/13/23  7:34 AM  Result Value Ref Range   Lactic Acid, Venous 0.5 0.5 - 1.9 mmol/L    Comment: Performed  at [facility], [address], [place], [place] [address]  Glucose, capillary     Status: Abnormal   Collection Time: 05/13/23  7:54 AM  Result Value Ref Range   Glucose-Capillary 161 (H) 70 - 99 mg/dL    Comment: Glucose reference range applies only to samples taken after fasting for at least 8 hours.  Blood gas, venous     Status: Abnormal   Collection Time: 05/13/23  8:48 AM  Result Value Ref Range   pH, Ven 7.17 (LL) 7.25 - 7.43    Comment: CRITICAL RESULT CALLED TO, READ BACK BY AND VERIFIED WITH: Jolly Mango, RN @0906  01.23.25. A.ALTOM CORRECTED ON 01/23 AT 1419: PREVIOUSLY REPORTED AS 7.17 CRITICAL RESULT CALLED TO, READ BACK BY AND VERIFIED WITH: Jolly Mango, RN @0906  01.23.26. A.ALTOM    pCO2, Ven  24 (L) 44 - 60 mmHg   pO2, Ven 43 32 - 45 mmHg   Bicarbonate 8.8 (L) 20.0 - 28.0 mmol/L   Acid-base deficit 18.0 (H) 0.0 - 2.0 mmol/L   O2 Saturation 75 %   Patient temperature 37.1    Collection site RIGHT HAND    Drawn by [id number]     Comment: Performed at [facility], [address], [place], [place] [address]  Glucose, capillary     Status: Abnormal   Collection Time: 05/13/23 12:21 PM  Result Value Ref Range   Glucose-Capillary 199 (H) 70 - 99 mg/dL    Comment: Glucose reference range applies only to samples taken after fasting for at least 8 hours.  Glucose, capillary     Status: Abnormal   Collection Time: 05/13/23  5:17 PM  Result Value Ref Range   Glucose-Capillary 179 (H) 70 - 99 mg/dL    Comment: Glucose reference range applies only to samples taken after fasting for at least 8 hours.  Glucose, capillary     Status: Abnormal   Collection Time: 05/13/23  9:13 PM  Result Value Ref Range   Glucose-Capillary 171 (H) 70 - 99 mg/dL    Comment: Glucose reference range applies only to samples taken after fasting for at least 8 hours.  Renal function panel     Status: Abnormal   Collection Time: 05/14/23  6:05 AM  Result Value Ref Range   Sodium 138 135 - 145 mmol/L   Potassium 3.3 (L) 3.5 - 5.1 mmol/L   Chloride 106 98 - 111 mmol/L   CO2 14 (L) 22 - 32 mmol/L   Glucose, Bld 160 (H) 70 - 99 mg/dL    Comment: Glucose reference range applies only to samples taken after fasting for at least 8 hours.   BUN 166 (H) 6 - 20 mg/dL   Creatinine, Ser 78.46 (H) 0.61 - 1.24 mg/dL   Calcium 8.3 (L) 8.9 - 10.3 mg/dL   Phosphorus 8.7 (H) 2.5 - 4.6 mg/dL   Albumin 2.6 (L) 3.5 - 5.0 g/dL   GFR, Estimated 4 (L) >60 mL/min    Comment: (NOTE) Calculated using the CKD-EPI Creatinine Equation (2021)    Anion gap 18 (H) 5 - 15    Comment: Performed at [facility], [address], [place], [place] [address]  CBC     Status: Abnormal   Collection Time: 05/14/23  6:05 AM  Result Value  Ref Range   WBC 6.1 4.0 - 10.5 K/uL   RBC 2.63 (L) 4.22 - 5.81 MIL/uL   Hemoglobin 7.3 (L) 13.0 - 17.0 g/dL   HCT  21.9 (L) 39.0 - 52.0 %   MCV 83.3 80.0 - 100.0 fL   MCH 27.8 26.0 - 34.0 pg   MCHC 33.3 30.0 - 36.0 g/dL   RDW 16.1 09.6 - 04.5 %   Platelets 272 150 - 400 K/uL   nRBC 0.0 0.0 - 0.2 %    Comment: Performed at [facility], [address] [place], [place] [address]   CT Renal Stone Study Result Date: 05/12/2023 CLINICAL DATA:  Shortness of breath. History of renal transplant. Concern for renal ischemia or infarct. EXAM: CT ABDOMEN AND PELVIS WITHOUT CONTRAST TECHNIQUE: Multidetector CT imaging of the abdomen and pelvis was performed following the standard protocol without IV contrast. RADIATION DOSE REDUCTION: This exam was performed according to the departmental dose-optimization program which includes automated exposure control, adjustment of the mA and/or kV according to patient size and/or use of iterative reconstruction technique. COMPARISON:  None Available. FINDINGS: Evaluation of this exam is limited in the absence of intravenous contrast. Lower chest: The visualized lung bases are clear. There is coronary vascular calcification. No intra-abdominal free air or free fluid. Hepatobiliary: The liver is unremarkable. No biliary dilatation. The gallbladder is unremarkable. Pancreas: Unremarkable. No pancreatic ductal dilatation or surrounding inflammatory changes. Spleen: Normal in size without focal abnormality. Adrenals/Urinary Tract: The adrenal glands unremarkable. Severe atrophy of the native kidneys. A 1.8 cm indeterminate exophytic lesion from the medial right kidney is not characterized on this CT and may represent a complex or proteinaceous cyst. Further evaluation with ultrasound on a nonemergent/outpatient basis recommended. No hydronephrosis of the native kidneys. There is a right lower quadrant renal transplant. There is mild hydronephrosis of the transplant kidney.  The transplant kidney appears slightly edematous. There is stranding of the fat plane adjacent to the transplant kidney. No peritransplant fluid collection. No stone noted within the transplant kidney or ureter. The urinary bladder is unremarkable. Stomach/Bowel: Mild sigmoid diverticulosis. There is no bowel obstruction or active inflammation. The appendix is normal. Vascular/Lymphatic: Mild atherosclerotic calcification of the abdominal aorta. The IVC is unremarkable. No portal venous gas. There is no adenopathy. Reproductive: The prostate and seminal vesicles are grossly unremarkable. No pelvic mass. Other: Small fat containing umbilical hernia. There is stranding of the periumbilical subcutaneous fat may be related to recent procedure. No fluid collection. Musculoskeletal: Avascular necrosis of the left femoral head with some cortical fragmentation. Evidence of prior left femoral neck screw. No acute osseous pathology. IMPRESSION: 1. Right lower quadrant renal transplant with mild hydronephrosis and edema of the transplant kidney. No peritransplant fluid collection. Correlation with urinalysis and further evaluation with renal transplant ultrasound and interrogation of the transplant vasculature and internal resistive indices recommended. 2. Mild sigmoid diverticulosis. No bowel obstruction. Normal appendix. 3. Avascular necrosis of the left femoral head. 4.  Aortic Atherosclerosis (ICD10-I70.0). Electronically Signed   By: Elgie Collard M.D.   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, Bilateral    Migraine    "  when I was young" (08/25/2016"   Posterior equinus, acquired    Bilateral   Pre-diabetes     PSH:   Past Surgical History:  Procedure Laterality Date   AV FISTULA PLACEMENT Left 10/15/2016   Procedure: ARTERIOVENOUS (AV) FISTULA CREATION-LEFT ARM;  Surgeon: Fransisco Hertz, MD;  Location[facility] OR;  Service: Vascular;  Laterality: Left;   DECOMPRESSION HIP-CORE Left 12/07/2022   Procedure: LEFT HIP CORE DECOMPRESSION WITH ILIAC CREST BONE MARROW ASPIRATE;  Surgeon: Huel Cote, MD;  Location: [facility] OR;  Service: Orthopedics;  Laterality: Left;   INSERTION OF DIALYSIS CATHETER Right 10/15/2016   Procedure: INSERTION OF DIALYSIS CATHETER;  Surgeon: Fransisco Hertz, MD;  Location[facility];  Service: Vascular;  Laterality: Right;   KIDNEY TRANSPLANT  06/15/2020   [facility]   WISDOM TOOTH EXTRACTION      Allergies: No Known Allergies  Medications:   Prior to Admission medications   Medication Sig Start Date End Date Taking? Authorizing Provider  amLODipine (NORVASC) 10 MG tablet Take 1 tablet (10 mg total) by mouth at bedtime. 09/14/11 05/13/23 Yes Lonia Blood, MD  carvedilol (COREG) 25 MG tablet Take 1 tablet (25 mg total) by mouth 2 (two) times daily with a meal. 09/14/11 05/13/23 Yes Lonia Blood, MD  cinacalcet (SENSIPAR) 30 MG tablet Take 30 mg by mouth daily.   Yes [provider]  cloNIDine (CATAPRES) 0.3 MG tablet Take 1 tablet (0.3 mg total) by mouth 3 (three) times daily. Patient taking differently: Take 0.3 mg by mouth 2 (two) times daily. 08/27/16  Yes Arnetha Courser, MD  doxycycline (VIBRAMYCIN) 100 MG capsule Take 100 mg by mouth 2 (two) times daily. For 7 days. Take with 8oz of water. Do not lie down for at least 30 minutes after. 05/12/23 05/19/23 Yes [provider]  fenofibrate (TRICOR) 145 MG tablet Take 145 mg by mouth daily.   Yes [provider]  icosapent Ethyl (VASCEPA) 1 g capsule Take 2 g by mouth 2 (two) times  daily.   Yes [provider]  insulin degludec (TRESIBA) 200 UNIT/ML FlexTouch Pen Inject 30 Units into the skin at bedtime. 09/23/21  Yes [provider]  insulin lispro (HUMALOG KWIKPEN) 200 UNIT/ML KwikPen Inject 25-40 Units into the skin 3 (three) times daily as needed (high blood sugar). 09/02/20  Yes [provider]  MOUNJARO 15 MG/0.5ML Pen Inject 15 mg into the skin once a week.   Yes [provider]  mycophenolate (CELLCEPT) 500 MG tablet Take 1,000 mg by mouth 2 (two) times daily.   Yes [provider]  predniSONE (DELTASONE) 5 MG tablet Take 5 mg by mouth daily.   Yes [provider]  sulfamethoxazole-trimethoprim (BACTRIM) 400-80 MG tablet Take 1 tablet by mouth every Monday, Wednesday, and Friday.   Yes [provider]  tacrolimus (PROGRAF) 1 MG capsule Take 2-3 mg by mouth See admin instructions. Take 3 capsules (3mg ) in the morning and then take 2 capsules (2mg ) at bedtime.   Yes [provider]  tadalafil (CIALIS) 20 MG tablet Take 20 mg by mouth daily as needed for erectile dysfunction.   Yes [provider]  traZODone (DESYREL) 50 MG tablet Take 50 mg by mouth at bedtime.   Yes [provider]  Vitamin D, Ergocalciferol, (DRISDOL) 1.25 MG (50000 UNIT) CAPS capsule Take 50,000 Units by mouth every 7 (seven) days. 03/02/23  Yes [provider]  aspirin EC 325 MG tablet Take 1 tablet (  325 mg total) by mouth daily. Patient not taking: Reported on 11/19/2022 10/19/22   Huel Cote, MD  oxyCODONE (ROXICODONE) 5 MG immediate release tablet Take 1 tablet (5 mg total) by mouth every 4 (four) hours as needed for severe pain or breakthrough pain. Patient not taking: Reported on 05/13/2023 10/19/22   Huel Cote, MD    Discontinued Meds:   Medications Discontinued During This Encounter  Medication Reason   tacrolimus (PROGRAF) capsule 3 mg     Social History:  reports that he has never  smoked. He has never used smokeless tobacco. He reports that he does not drink alcohol and does not use drugs.  Family History:   Family History  Problem Relation Age of Onset   Hypertension Father        Also maternal grandmother   Diabetes Father        also maternal grandmother   CVA Father    Cancer Maternal Grandfather        lung   Heart attack Maternal Grandfather     Blood pressure (!) 140/86, pulse 94, temperature 97.6 F (36.4 C), temperature source Oral, resp. rate 19, height 5\' 10"  (1.778 m), weight 107.5 kg, SpO2 96%. Physical Exam: Gen: tired but comfortable appearing in bed  Eyes: anicteric, no conjunctival injection, EOMI Neck: supple, thick CV:  tachycardic, regular Abd:  soft, nontender, mildly distended, NABS, RLQ transplant nontender Lungs: clear ant, no coughing during interview GU: no foley Extr:  no edema, no joint effusions; L Cimino AVF +t/b - feels useable Neuro: nonfocal Skin: no rashes noted     Ethelene Hal, MD 05/14/2023, 7:44 AM

## 2023-05-14 NOTE — Plan of Care (Signed)
  Problem: Education: Goal: Knowledge of General Education information will improve Description: Including pain rating scale, medication(s)/side effects and non-pharmacologic comfort measures Outcome: Progressing   Problem: Activity: Goal: Risk for activity intolerance will decrease Outcome: Progressing   Problem: Elimination: Goal: Will not experience complications related to urinary retention Outcome: Progressing   Problem: Pain Managment: Goal: General experience of comfort will improve and/or be controlled Outcome: Progressing   Problem: Skin Integrity: Goal: Risk for impaired skin integrity will decrease Outcome: Progressing   Problem: Nutritional: Goal: Maintenance of adequate nutrition will improve Outcome: Progressing

## 2023-05-15 DIAGNOSIS — N179 Acute kidney failure, unspecified: Secondary | ICD-10-CM | POA: Diagnosis not present

## 2023-05-15 LAB — BASIC METABOLIC PANEL
Anion gap: 18 — ABNORMAL HIGH (ref 5–15)
BUN: 154 mg/dL — ABNORMAL HIGH (ref 6–20)
CO2: 16 mmol/L — ABNORMAL LOW (ref 22–32)
Calcium: 7.9 mg/dL — ABNORMAL LOW (ref 8.9–10.3)
Chloride: 103 mmol/L (ref 98–111)
Creatinine, Ser: 12.82 mg/dL — ABNORMAL HIGH (ref 0.61–1.24)
GFR, Estimated: 4 mL/min — ABNORMAL LOW (ref >60)
Glucose, Bld: 173 mg/dL — ABNORMAL HIGH (ref 70–99)
Potassium: 2.9 mmol/L — ABNORMAL LOW (ref 3.5–5.1)
Sodium: 137 mmol/L (ref 135–145)

## 2023-05-15 LAB — CBC
HCT: 21.7 % — ABNORMAL LOW (ref 39.0–52.0)
Hemoglobin: 7.4 g/dL — ABNORMAL LOW (ref 13.0–17.0)
MCH: 28.2 pg (ref 26.0–34.0)
MCHC: 34.1 g/dL (ref 30.0–36.0)
MCV: 82.8 fL (ref 80.0–100.0)
Platelets: 292 10*3/uL (ref 150–400)
RBC: 2.62 MIL/uL — ABNORMAL LOW (ref 4.22–5.81)
RDW: 14.5 % (ref 11.5–15.5)
WBC: 5.4 10*3/uL (ref 4.0–10.5)
nRBC: 0 % (ref 0.0–0.2)

## 2023-05-15 LAB — HUMAN PARVOVIRUS DNA DETECTION BY PCR: Parvovirus B19, PCR: NEGATIVE

## 2023-05-15 LAB — GLUCOSE, CAPILLARY
Glucose-Capillary: 148 mg/dL — ABNORMAL HIGH (ref 70–99)
Glucose-Capillary: 281 mg/dL — ABNORMAL HIGH (ref 70–99)

## 2023-05-15 LAB — TACROLIMUS LEVEL: Tacrolimus (FK506) - LabCorp: 3.2 ng/mL (ref 2.0–20.0)

## 2023-05-15 MED ORDER — INSULIN GLARGINE-YFGN 100 UNIT/ML ~~LOC~~ SOLN
10.0000 [IU] | Freq: Every day | SUBCUTANEOUS | Status: DC
  Administered 2023-05-15 – 2023-05-17 (×3): 10 [IU] via SUBCUTANEOUS
  Filled 2023-05-15 (×3): qty 0.1

## 2023-05-15 MED ORDER — GUAIFENESIN ER 600 MG PO TB12
600.0000 mg | ORAL_TABLET | Freq: Two times a day (BID) | ORAL | Status: DC | PRN
  Administered 2023-05-15 – 2023-05-17 (×2): 600 mg via ORAL
  Filled 2023-05-15 (×2): qty 1

## 2023-05-15 MED ORDER — POTASSIUM CHLORIDE CRYS ER 20 MEQ PO TBCR
40.0000 meq | EXTENDED_RELEASE_TABLET | Freq: Once | ORAL | Status: AC
  Administered 2023-05-15: 40 meq via ORAL
  Filled 2023-05-15: qty 2

## 2023-05-15 MED ORDER — SODIUM BICARBONATE 650 MG PO TABS
1300.0000 mg | ORAL_TABLET | Freq: Three times a day (TID) | ORAL | Status: DC
  Administered 2023-05-15 – 2023-05-18 (×9): 1300 mg via ORAL
  Filled 2023-05-15 (×9): qty 2

## 2023-05-15 MED ORDER — ZOLPIDEM TARTRATE 5 MG PO TABS: 5.0000 mg | ORAL_TABLET | Freq: Every evening | ORAL | Status: DC | PRN

## 2023-05-15 NOTE — Progress Notes (Signed)
Barry Nichols is an 48 y.o. male with ESRD s/p renal transplant 05/2020, HTN, gout, DM type 2, HTN, OA   p/w 3 week h/o N/V/D - watery diarrhea 3-4/d, cough, malaise, myalgias, sore throat to start.  Amoxicillin from PCP.  Due to lack of improved was re evaluated 1/22 by PCP and rx'd doxycycline.  Out labs showed AKI and was dispatched to ED.  At [facility] found to have Cr 17, bicarb 8, Hb 8.2, plt 307.  Ct renal study with mild hydro of transplanted kidney.  VBG 7.05 / 25.  Given 2L NS, 1 amp bicarb and started on bicarb gtt at 100/hr.  Didn't miss any immunosuppression, No LUTS, hematuria but urine has looked darker.  No NSAIDs. Follows quarterly with [facility] in [place]; transplant was at [facility]. He is interested in re est with Dr. Signe Colt locally who previously followed him.   Assessment/Plan: **ESRD s/p renal transplant 2022 now with severe AKI:  baseline Cr 1.9 as of 01/2023.  Several weeks of poor po intake in the setting of what appeared to be URI +N/V/D - certainly has prerenal component and possibly may have progressed to ATN.  Cont IVF (bicarb gtt).  CT 1/23 with mild transplant hydro but no clear obstruction; will do PVRs today with low threshold for foley placement.   1/22 UA many RBC, many WBC and 100 protein raising concern for other etiologies.    Will check Parvo PCR with hematuria.  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  Hopefully if HD is  needed at all it'll be temporary.  Strict I/Os, daily labs.  Tac level 1/24 collected and in process.     - He still has some diarrhea but it is decreasing in volume.  Only 1 bowel movement this morning  -No absolute indication for dialysis and hopeful he will continue trending in the right direction. - Fortunately no missed immunosuppression but if renal function stagnates does not improve consistently low threshhold for renal biopsy in the coming days.   Earliest would be Monday.  -Renal function slightly improved; urine output >2L. -Will stop the D5W +3 A of bicarb.  Substitute with oral bicarb as tolerated and will monitor response with bmet tomorrow morning.  Just with patient and spouse and they agree.  We can always start the D5 W's bicarb again tomorrow if needed.  **Anemia, normocytic:  no overt bleeding. Hb 11/2022 12s now in 7's and stable overnight. Plt count normal and T bili normal.  Anemia panel pending.     **AGMA: in the setting of severe AKI; on bicarb gtt after 1 amp push.  Check another VBG with lactate this AM.  Continue D5W plus to be as bicarb at 100 mL/h.  Patient still has diarrhea.   **N/V/D: protracted course; stool studies ordered.    **HTN: on home meds for now, hold if BP trends down.    **DM: per primary.   Subjective: Feeling better with less shortness of breath and tolerating fluids.  Continued diarrhea but has decreased in volume, only 1 bowel movement this morning and states that is becoming more solid.  Denies fever chills nausea vomiting.   Chemistry and CBC: Creatinine, Ser  Date/Time Value Ref Range Status  05/15/2023 04:33 AM 12.82 (H) 0.61 - 1.24 mg/dL Final  81/19/1478 29:56 AM 14.84 (H) 0.61 - 1.24 mg/dL Final  21/30/8657 84:69 AM 16.97 (  H) 0.61 - 1.24 mg/dL Final  16/01/9603 54:09 PM 17.35 (H) 0.61 - 1.24 mg/dL Final  81/19/1478 29:56 AM 1.74 (H) 0.61 - 1.24 mg/dL Final  21/30/8657 84:69 AM 9.43 (H) 0.61 - 1.24 mg/dL Final  62/95/2841 32:44 AM 9.05 (H) 0.61 - 1.24 mg/dL Final  04/22/7251 66:44 AM 8.86 (H) 0.61 - 1.24 mg/dL Final  03/47/4259 56:38 AM 2.63 (H) 0.50 - 1.35 mg/dL Final  75/64/3329 51:88 AM 2.71 (H) 0.50 - 1.35 mg/dL Final  41/66/0630 16:01 AM 2.60 (H) 0.50 - 1.35 mg/dL Final  09/32/3557 32:20 AM 2.76 (H) 0.50 - 1.35 mg/dL Final  25/42/7062 37:62 AM 2.70 (H) 0.50 - 1.35 mg/dL Final   Recent Labs  Lab 05/12/23 2030 05/12/23 2037 05/13/23 0537 05/14/23 0605 05/15/23 0433  NA  128* 132* 134* 138 137  K 5.1 5.3* 4.4 3.3* 2.9*  CL 103  --  107 106 103  CO2 8*  --  <7* 14* 16*  GLUCOSE 166*  --  167* 160* 173*  BUN 171*  --  175* 166* 154*  CREATININE 17.35*  --  16.97* 14.84* 12.82*  CALCIUM 8.9  --  8.6* 8.3* 7.9*  PHOS  --   --  10.4* 8.7*  --    Recent Labs  Lab 05/12/23 2030 05/12/23 2037 05/13/23 0539 05/14/23 0605 05/15/23 0433  WBC 7.9  --  6.7 6.1 5.4  NEUTROABS 6.4  --  5.4  --   --   HGB 8.2* 8.8* 7.7* 7.3* 7.4*  HCT 25.0* 26.0* 24.6* 21.9* 21.7*  MCV 85.6  --  89.5 83.3 82.8  PLT 307  --  283 272 292   Liver Function Tests: Recent Labs  Lab 05/12/23 2030 05/13/23 0537 05/14/23 0605  AST 42*  --   --   ALT 59*  --   --   ALKPHOS 38  --   --   BILITOT 0.6  --   --   PROT 6.7  --   --   ALBUMIN 3.1* 2.7* 2.6*   No results for input(s): "LIPASE", "AMYLASE" in the last 168 hours. No results for input(s): "AMMONIA" in the last 168 hours. Cardiac Enzymes: Recent Labs  Lab 05/13/23 0731  CKTOTAL 231   Iron Studies:  Recent Labs    05/13/23 0539  IRON 189*  TIBC 288  FERRITIN 966*   PT/INR: @LABRCNTIP (inr:5)  Xrays/Other Studies: ) Results for orders placed or performed during the hospital encounter of 05/12/23 (from the past 48 hours)  Glucose, capillary     Status: Abnormal   Collection Time: 05/13/23 12:21 PM  Result Value Ref Range   Glucose-Capillary 199 (H) 70 - 99 mg/dL    Comment: Glucose reference range applies only to samples taken after fasting for at least 8 hours.  Glucose, capillary     Status: Abnormal   Collection Time: 05/13/23  5:17 PM  Result Value Ref Range   Glucose-Capillary 179 (H) 70 - 99 mg/dL    Comment: Glucose reference range applies only to samples taken after fasting for at least 8 hours.  Gastrointestinal Panel by PCR , Stool     Status: None   Collection Time: 05/13/23  7:14 PM   Specimen: Stool  Result Value Ref Range   Campylobacter species NOT DETECTED NOT DETECTED   Plesimonas  shigelloides NOT DETECTED NOT DETECTED   Salmonella species NOT DETECTED NOT DETECTED   Yersinia enterocolitica NOT DETECTED NOT DETECTED   Vibrio species NOT DETECTED NOT DETECTED  Vibrio cholerae NOT DETECTED NOT DETECTED   Enteroaggregative E coli (EAEC) NOT DETECTED NOT DETECTED   Enteropathogenic E coli (EPEC) NOT DETECTED NOT DETECTED   Enterotoxigenic E coli (ETEC) NOT DETECTED NOT DETECTED   Shiga like toxin producing E coli (STEC) NOT DETECTED NOT DETECTED   Shigella/Enteroinvasive E coli (EIEC) NOT DETECTED NOT DETECTED   Cryptosporidium NOT DETECTED NOT DETECTED   Cyclospora cayetanensis NOT DETECTED NOT DETECTED   Entamoeba histolytica NOT DETECTED NOT DETECTED   Giardia lamblia NOT DETECTED NOT DETECTED   Adenovirus F40/41 NOT DETECTED NOT DETECTED   Astrovirus NOT DETECTED NOT DETECTED   Norovirus GI/GII NOT DETECTED NOT DETECTED   Rotavirus A NOT DETECTED NOT DETECTED   Sapovirus (I, II, IV, and V) NOT DETECTED NOT DETECTED    Comment: Performed at [facility], [address] [place], [place] [address]  Glucose, capillary     Status: Abnormal   Collection Time: 05/13/23  9:13 PM  Result Value Ref Range   Glucose-Capillary 171 (H) 70 - 99 mg/dL    Comment: Glucose reference range applies only to samples taken after fasting for at least 8 hours.  Renal function panel     Status: Abnormal   Collection Time: 05/14/23  6:05 AM  Result Value Ref Range   Sodium 138 135 - 145 mmol/L   Potassium 3.3 (L) 3.5 - 5.1 mmol/L   Chloride 106 98 - 111 mmol/L   CO2 14 (L) 22 - 32 mmol/L   Glucose, Bld 160 (H) 70 - 99 mg/dL    Comment: Glucose reference range applies only to samples taken after fasting for at least 8 hours.   BUN 166 (H) 6 - 20 mg/dL   Creatinine, Ser 29.52 (H) 0.61 - 1.24 mg/dL   Calcium 8.3 (L) 8.9 - 10.3 mg/dL   Phosphorus 8.7 (H) 2.5 - 4.6 mg/dL   Albumin 2.6 (L) 3.5 - 5.0 g/dL   GFR, Estimated 4 (L) >60 mL/min    Comment: (NOTE) Calculated using  the CKD-EPI Creatinine Equation (2021)    Anion gap 18 (H) 5 - 15    Comment: Performed at [facility], [address] [place], [place] [address]  CBC     Status: Abnormal   Collection Time: 05/14/23  6:05 AM  Result Value Ref Range   WBC 6.1 4.0 - 10.5 K/uL   RBC 2.63 (L) 4.22 - 5.81 MIL/uL   Hemoglobin 7.3 (L) 13.0 - 17.0 g/dL   HCT 44.0 (L) 10.2 - 72.5 %   MCV 83.3 80.0 - 100.0 fL   MCH 27.8 26.0 - 34.0 pg   MCHC 33.3 30.0 - 36.0 g/dL   RDW 36.6 44.0 - 34.7 %   Platelets 272 150 - 400 K/uL   nRBC 0.0 0.0 - 0.2 %    Comment: Performed at [facility], [address] [place], [place] [address]  Glucose, capillary     Status: Abnormal   Collection Time: 05/14/23  8:11 AM  Result Value Ref Range   Glucose-Capillary 156 (H) 70 - 99 mg/dL    Comment: Glucose reference range applies only to samples taken after fasting for at least 8 hours.  Glucose, capillary     Status: Abnormal   Collection Time: 05/14/23 10:43 AM  Result Value Ref Range   Glucose-Capillary 233 (H) 70 - 99 mg/dL    Comment: Glucose reference range applies only to samples taken after fasting for at least 8 hours.  Glucose, capillary     Status: Abnormal   Collection Time: 05/14/23 12:49 PM  Result Value Ref Range   Glucose-Capillary 210 (H) 70 - 99 mg/dL    Comment: Glucose reference range applies only to samples taken after fasting for at least 8 hours.  Glucose, capillary     Status: Abnormal   Collection Time: 05/14/23  4:28 PM  Result Value Ref Range   Glucose-Capillary 259 (H) 70 - 99 mg/dL    Comment: Glucose reference range applies only to samples taken after fasting for at least 8 hours.  Glucose, capillary     Status: Abnormal   Collection Time: 05/14/23  9:51 PM  Result Value Ref Range   Glucose-Capillary 210 (H) 70 - 99 mg/dL    Comment: Glucose reference range applies only to samples taken after fasting for at least 8 hours.   Comment 1 Notify RN    Comment 2 Document in Chart    CBC     Status: Abnormal   Collection Time: 05/15/23  4:33 AM  Result Value Ref Range   WBC 5.4 4.0 - 10.5 K/uL   RBC 2.62 (L) 4.22 - 5.81 MIL/uL   Hemoglobin 7.4 (L) 13.0 - 17.0 g/dL   HCT 16.1 (L) 09.6 - 04.5 %   MCV 82.8 80.0 - 100.0 fL   MCH 28.2 26.0 - 34.0 pg   MCHC 34.1 30.0 - 36.0 g/dL   RDW 40.9 81.1 - 91.4 %   Platelets 292 150 - 400 K/uL   nRBC 0.0 0.0 - 0.2 %    Comment: Performed at [facility], [address] [place], [place] [address]  Basic metabolic panel     Status: Abnormal   Collection Time: 05/15/23  4:33 AM  Result Value Ref Range   Sodium 137 135 - 145 mmol/L   Potassium 2.9 (L) 3.5 - 5.1 mmol/L   Chloride 103 98 - 111 mmol/L   CO2 16 (L) 22 - 32 mmol/L   Glucose, Bld 173 (H) 70 - 99 mg/dL    Comment: Glucose reference range applies only to samples taken after fasting for at least 8 hours.   BUN 154 (H) 6 - 20 mg/dL   Creatinine, Ser 62.13 (H) 0.61 - 1.24 mg/dL   Calcium 7.9 (L) 8.9 - 10.3 mg/dL   GFR, Estimated 4 (L) >60 mL/min    Comment: (NOTE) Calculated using the CKD-EPI Creatinine Equation (2021)    Anion gap 18 (H) 5 - 15    Comment: Performed at [facility], [address], [place], [place] [address]  Glucose, capillary     Status: Abnormal   Collection Time: 05/15/23  8:40 AM  Result Value Ref Range   Glucose-Capillary 148 (H) 70 - 99 mg/dL    Comment: Glucose reference range applies only to samples taken after fasting for at least 8 hours.   No results found.   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, Bilateral  Migraine    "when I was  young" (08/25/2016"   Posterior equinus, acquired    Bilateral   Pre-diabetes     PSH:   Past Surgical History:  Procedure Laterality Date   AV FISTULA PLACEMENT Left 10/15/2016   Procedure: ARTERIOVENOUS (AV) FISTULA CREATION-LEFT ARM;  Surgeon: Fransisco Hertz, MD;  Location: [facility] OR;  Service: Vascular;  Laterality: Left;   DECOMPRESSION HIP-CORE Left 12/07/2022   Procedure: LEFT HIP CORE DECOMPRESSION WITH ILIAC CREST BONE MARROW ASPIRATE;  Surgeon: Huel Cote, MD;  Location: [facility] OR;  Service: Orthopedics;  Laterality: Left;   INSERTION OF DIALYSIS CATHETER Right 10/15/2016   Procedure: INSERTION OF DIALYSIS CATHETER;  Surgeon: Fransisco Hertz, MD;  Location: [facility] OR;  Service: Vascular;  Laterality: Right;   KIDNEY TRANSPLANT  06/15/2020   [facility]   WISDOM TOOTH EXTRACTION      Allergies: No Known Allergies  Medications:   Prior to Admission medications   Medication Sig Start Date End Date Taking? Authorizing Provider  amLODipine (NORVASC) 10 MG tablet Take 1 tablet (10 mg total) by mouth at bedtime. 09/14/11 05/13/23 Yes Lonia Blood, MD  carvedilol (COREG) 25 MG tablet Take 1 tablet (25 mg total) by mouth 2 (two) times daily with a meal. 09/14/11 05/13/23 Yes Lonia Blood, MD  cinacalcet (SENSIPAR) 30 MG tablet Take 30 mg by mouth daily.   Yes [provider]  cloNIDine (CATAPRES) 0.3 MG tablet Take 1 tablet (0.3 mg total) by mouth 3 (three) times daily. Patient taking differently: Take 0.3 mg by mouth 2 (two) times daily. 08/27/16  Yes Arnetha Courser, MD  doxycycline (VIBRAMYCIN) 100 MG capsule Take 100 mg by mouth 2 (two) times daily. For 7 days. Take with 8oz of water. Do not lie down for at least 30 minutes after. 05/12/23 05/19/23 Yes [provider]  fenofibrate (TRICOR) 145 MG tablet Take 145 mg by mouth daily.   Yes [provider]  icosapent Ethyl (VASCEPA) 1 g capsule Take 2 g by mouth 2 (two) times daily.   Yes [provider]  insulin degludec (TRESIBA) 200 UNIT/ML FlexTouch Pen Inject 30 Units into the skin at bedtime. 09/23/21  Yes [provider]  insulin lispro (HUMALOG KWIKPEN) 200 UNIT/ML KwikPen Inject 25-40 Units into the skin 3 (three) times daily as needed (high blood sugar). 09/02/20  Yes [provider]  MOUNJARO 15 MG/0.5ML Pen Inject 15 mg into the skin once a week.   Yes [provider]  mycophenolate (CELLCEPT) 500 MG tablet Take 1,000 mg by mouth 2 (two) times daily.   Yes [provider]  predniSONE (DELTASONE) 5 MG tablet Take 5 mg by mouth daily.   Yes [provider]  sulfamethoxazole-trimethoprim (BACTRIM) 400-80 MG tablet Take 1 tablet by mouth every Monday, Wednesday, and Friday.   Yes [provider]  tacrolimus (PROGRAF) 1 MG capsule Take 2-3 mg by mouth See admin instructions. Take 3 capsules (3mg ) in the morning and then take 2 capsules (2mg ) at bedtime.   Yes [provider]  tadalafil (CIALIS) 20 MG tablet Take 20 mg by mouth daily as needed for erectile dysfunction.   Yes [provider]  traZODone (DESYREL) 50 MG tablet Take 50 mg by mouth at bedtime.   Yes [provider]  Vitamin D, Ergocalciferol, (DRISDOL) 1.25 MG (50000 UNIT) CAPS capsule Take 50,000 Units by mouth every 7 (seven) days. 03/02/23  Yes [provider]  aspirin EC 325  MG tablet Take 1 tablet (325 mg total) by mouth daily. Patient not taking: Reported on 11/19/2022 10/19/22   Huel Cote, MD  oxyCODONE (ROXICODONE) 5 MG immediate release tablet Take 1 tablet (5 mg total) by mouth every 4 (four) hours as needed for severe pain or breakthrough pain. Patient not taking: Reported on 05/13/2023 10/19/22   Huel Cote, MD    Discontinued Meds:   Medications Discontinued During This Encounter  Medication Reason   tacrolimus (PROGRAF) capsule 3 mg     Social History:  reports that he has never smoked. He has never used  smokeless tobacco. He reports that he does not drink alcohol and does not use drugs.  Family History:   Family History  Problem Relation Age of Onset   Hypertension Father        Also maternal grandmother   Diabetes Father        also maternal grandmother   CVA Father    Cancer Maternal Grandfather        lung   Heart attack Maternal Grandfather     Blood pressure 133/80, pulse 88, temperature 98 F (36.7 C), temperature source Oral, resp. rate 18, height 5\' 10"  (1.778 m), weight 105 kg, SpO2 97%. Physical Exam: Gen: tired but comfortable appearing in bed  Eyes: anicteric, no conjunctival injection, EOMI Neck: supple, thick CV:  tachycardic, regular Abd:  soft, nontender, mildly distended, NABS, RLQ transplant nontender Lungs: clear ant, no coughing during interview GU: no foley Extr:  no edema, no joint effusions; L Cimino AVF +t/b - feels useable Neuro: nonfocal Skin: no rashes noted     Ethelene Hal, MD 05/15/2023, 11:23 AM

## 2023-05-15 NOTE — Progress Notes (Signed)
PROGRESS NOTE    Barry Nichols  ZOX:[id number] DOB: [birth year] DOA: 05/12/2023 PCP: Virgilio Belling, PA-C    Brief Narrative:  Barry Nichols is a 48 y.o. male with history of renal transplant in February 2022 being followed by nephrologist at [facility], diabetes mellitus type 2, hypertension was referred to the ER by primary care physician after labs showed acute worsening of patient's renal functions.  Patient states he has been feeling poorly since neither issue 3 weeks ago.  He has been having intermittent episodes of diarrhea vomiting and also had some symptoms concerning for upper respiratory infection.  Patient was placed on antibiotics (amoxicillin) by primary care physician about a week ago course of which patient completed and had presented again to the PCP yesterday and was placed on another course of antibiotics (doxycycline) and had blood drawn which showed acute renal failure.  Patient denies taking any NSAIDs and has been compliant with his antirejection medications including tacrolimus mycophenolate and prednisone and Bactrim.    Assessment and Plan: Acute renal failure in renal transplanted kidney with metabolic acidosis -    -renal consulted-- no indication for dialysis and hopeful he will continue trending in the right direction. - Fortunately no missed immunosuppression but if renal function stagnates does not improve consistently low threshhold for renal biopsy in the coming days.  Earliest would be Monday.  -daily labs-- CR trending down  Anemia  -trend, transfuse for <7  Hypertension  -on clonidine, carvedilol, and amlodipine.  Diabetes mellitus type 2 takes [place] 20 units which patient has not taken the last 1 week due to poor appetite.   -SSI  Hypokalemia -replete -for now will change diet  Insomnia -added PRN meds   DVT prophylaxis: heparin injection 5,000 Units Start: 05/13/23 1400    Code Status: Full  Code Family Communication:   Disposition Plan:  Level of care: Progressive Status is: Inpatient     Consultants:  renal   Subjective: Not sleeping well  Objective: Vitals:   05/14/23 2108 05/14/23 2319 05/15/23 0439 05/15/23 0500  BP: (!) 144/92 139/84    Pulse:      Resp:      Temp:  98.1 F (36.7 C) 98.6 F (37 C)   TempSrc:  Oral Oral   SpO2:      Weight:    105 kg  Height:        Intake/Output Summary (Last 24 hours) at 05/15/2023 0848 Last data filed at 05/15/2023 0440 Gross per 24 hour  Intake 5588.34 ml  Output 2575 ml  Net 3013.34 ml   Filed Weights   05/12/23 1852 05/15/23 0500  Weight: 107.5 kg 105 kg    Examination:   General: Appearance:    Obese male in no acute distress     Lungs:     respirations unlabored  Heart:    Normal heart rate.    MS:   All extremities are intact.   Neurologic:   Awake, alert      Data Reviewed: I have personally reviewed following labs and imaging studies  CBC: Recent Labs  Lab 05/12/23 2030 05/12/23 2037 05/13/23 0539 05/14/23 0605 05/15/23 0433  WBC 7.9  --  6.7 6.1 5.4  NEUTROABS 6.4  --  5.4  --   --   HGB 8.2* 8.8* 7.7* 7.3* 7.4*  HCT 25.0* 26.0* 24.6* 21.9* 21.7*  MCV 85.6  --  89.5 83.3 82.8  PLT 307  --  283 272  292   Basic Metabolic Panel: Recent Labs  Lab 05/12/23 2030 05/12/23 2037 05/13/23 0537 05/14/23 0605 05/15/23 0433  NA 128* 132* 134* 138 137  K 5.1 5.3* 4.4 3.3* 2.9*  CL 103  --  107 106 103  CO2 8*  --  <7* 14* 16*  GLUCOSE 166*  --  167* 160* 173*  BUN 171*  --  175* 166* 154*  CREATININE 17.35*  --  16.97* 14.84* 12.82*  CALCIUM 8.9  --  8.6* 8.3* 7.9*  PHOS  --   --  10.4* 8.7*  --    GFR: Estimated Creatinine Clearance: 8.7 mL/min (A) (by C-G formula based on SCr of 12.82 mg/dL (H)). Liver Function Tests: Recent Labs  Lab 05/12/23 2030 05/13/23 0537 05/14/23 0605  AST 42*  --   --   ALT 59*  --   --   ALKPHOS 38  --   --   BILITOT 0.6  --   --   PROT 6.7   --   --   ALBUMIN 3.1* 2.7* 2.6*   No results for input(s): "LIPASE", "AMYLASE" in the last 168 hours. No results for input(s): "AMMONIA" in the last 168 hours. Coagulation Profile: No results for input(s): "INR", "PROTIME" in the last 168 hours. Cardiac Enzymes: Recent Labs  Lab 05/13/23 0731  CKTOTAL 231   BNP (last 3 results) No results for input(s): "PROBNP" in the last 8760 hours. HbA1C: No results for input(s): "HGBA1C" in the last 72 hours. CBG: Recent Labs  Lab 05/14/23 0811 05/14/23 1043 05/14/23 1249 05/14/23 1628 05/14/23 2151  GLUCAP 156* 233* 210* 259* 210*   Lipid Profile: No results for input(s): "CHOL", "HDL", "LDLCALC", "TRIG", "CHOLHDL", "LDLDIRECT" in the last 72 hours. Thyroid Function Tests: No results for input(s): "TSH", "T4TOTAL", "FREET4", "T3FREE", "THYROIDAB" in the last 72 hours. Anemia Panel: Recent Labs    05/13/23 0539  VITAMINB12 337  FOLATE 5.8*  FERRITIN 966*  TIBC 288  IRON 189*  RETICCTPCT 1.6   Sepsis Labs: Recent Labs  Lab 05/13/23 0734  LATICACIDVEN 0.5    Recent Results (from the past 240 hours)  Urine Culture     Status: Abnormal   Collection Time: 05/12/23  3:12 AM   Specimen: Urine, Random  Result Value Ref Range Status   Specimen Description URINE, RANDOM  Final   Special Requests NONE Reflexed from [id number]  Final   Culture (A)  Final    <10,000 COLONIES/mL INSIGNIFICANT GROWTH Performed at [facility], [address] [place], [place] [address]    Report Status 05/14/2023 FINAL  Final  Resp panel by RT-PCR (RSV, Flu A&B, Covid) Anterior Nasal Swab     Status: None   Collection Time: 05/12/23  8:08 PM   Specimen: Anterior Nasal Swab  Result Value Ref Range Status   SARS Coronavirus 2 by RT PCR NEGATIVE NEGATIVE Final    Comment: (NOTE) SARS-CoV-2 target nucleic acids are NOT DETECTED.  The SARS-CoV-2 RNA is generally detectable in upper respiratory specimens during the acute phase of infection.  The lowest concentration of SARS-CoV-2 viral copies this assay can detect is 138 copies/mL. A negative result does not preclude SARS-Cov-2 infection and should not be used as the sole basis for treatment or other patient management decisions. A negative result may occur with  improper specimen collection/handling, submission of specimen other than nasopharyngeal swab, presence of viral mutation(s) within the areas targeted by this assay, and inadequate number of viral copies(<138 copies/mL). A  negative result must be combined with clinical observations, patient history, and epidemiological information. The expected result is Negative.  Fact Sheet for Patients:  BloggerCourse.com  Fact Sheet for Healthcare Providers:  SeriousBroker.it  This test is no t yet approved or cleared by the [place] FDA and  has been authorized for detection and/or diagnosis of SARS-CoV-2 by FDA under an Emergency Use Authorization (EUA). This EUA will remain  in effect (meaning this test can be used) for the duration of the COVID-19 declaration under Section 564(b)(1) of the Act, 21 U.S.C.section 360bbb-3(b)(1), unless the authorization is terminated  or revoked sooner.       Influenza A by PCR NEGATIVE NEGATIVE Final   Influenza B by PCR NEGATIVE NEGATIVE Final    Comment: (NOTE) The Xpert Xpress SARS-CoV-2/FLU/RSV plus assay is intended as an aid in the diagnosis of influenza from Nasopharyngeal swab specimens and should not be used as a sole basis for treatment. Nasal washings and aspirates are unacceptable for Xpert Xpress SARS-CoV-2/FLU/RSV testing.  Fact Sheet for Patients: BloggerCourse.com  Fact Sheet for Healthcare Providers: SeriousBroker.it  This test is not yet approved or cleared by the [place] FDA and has been authorized for detection and/or diagnosis of SARS-CoV-2 by FDA  under an Emergency Use Authorization (EUA). This EUA will remain in effect (meaning this test can be used) for the duration of the COVID-19 declaration under Section 564(b)(1) of the Act, 21 U.S.C. section 360bbb-3(b)(1), unless the authorization is terminated or revoked.     Resp Syncytial Virus by PCR NEGATIVE NEGATIVE Final    Comment: (NOTE) Fact Sheet for Patients: BloggerCourse.com  Fact Sheet for Healthcare Providers: SeriousBroker.it  This test is not yet approved or cleared by the [place] FDA and has been authorized for detection and/or diagnosis of SARS-CoV-2 by FDA under an Emergency Use Authorization (EUA). This EUA will remain in effect (meaning this test can be used) for the duration of the COVID-19 declaration under Section 564(b)(1) of the Act, 21 U.S.C. section 360bbb-3(b)(1), unless the authorization is terminated or revoked.  Performed at [facility], [address] [place], [place] [address]   Gastrointestinal Panel by PCR , Stool     Status: None   Collection Time: 05/13/23  7:14 PM   Specimen: Stool  Result Value Ref Range Status   Campylobacter species NOT DETECTED NOT DETECTED Final   Plesimonas shigelloides NOT DETECTED NOT DETECTED Final   Salmonella species NOT DETECTED NOT DETECTED Final   Yersinia enterocolitica NOT DETECTED NOT DETECTED Final   Vibrio species NOT DETECTED NOT DETECTED Final   Vibrio cholerae NOT DETECTED NOT DETECTED Final   Enteroaggregative E coli (EAEC) NOT DETECTED NOT DETECTED Final   Enteropathogenic E coli (EPEC) NOT DETECTED NOT DETECTED Final   Enterotoxigenic E coli (ETEC) NOT DETECTED NOT DETECTED Final   Shiga like toxin producing E coli (STEC) NOT DETECTED NOT DETECTED Final   Shigella/Enteroinvasive E coli (EIEC) NOT DETECTED NOT DETECTED Final   Cryptosporidium NOT DETECTED NOT DETECTED Final   Cyclospora cayetanensis NOT DETECTED NOT DETECTED  Final   Entamoeba histolytica NOT DETECTED NOT DETECTED Final   Giardia lamblia NOT DETECTED NOT DETECTED Final   Adenovirus F40/41 NOT DETECTED NOT DETECTED Final   Astrovirus NOT DETECTED NOT DETECTED Final   Norovirus GI/GII NOT DETECTED NOT DETECTED Final   Rotavirus A NOT DETECTED NOT DETECTED Final   Sapovirus (I, II, IV, and V) NOT DETECTED NOT DETECTED Final    Comment:  Performed at [facility], [address], [place], [place] [address]         Radiology Studies: No results found.       Scheduled Meds:  amLODipine  10 mg Oral QHS   carvedilol  25 mg Oral BID WC   Chlorhexidine Gluconate Cloth  6 each Topical Daily   cloNIDine  0.3 mg Oral BID   folic acid  1 mg Oral Daily   heparin  5,000 Units Subcutaneous Q8H   insulin aspart  0-6 Units Subcutaneous TID WC   mycophenolate  1,000 mg Oral BID   oxymetazoline  1 spray Each Nare BID   potassium chloride  40 mEq Oral Once   predniSONE  5 mg Oral Q breakfast   tacrolimus  3 mg Oral q morning   And   tacrolimus  2 mg Oral QPM   Continuous Infusions:  sodium bicarbonate 150 mEq in dextrose 5 % 1,150 mL infusion 100 mL/hr at 05/14/23 2225     LOS: 3 days    Time spent: 45 minutes spent on chart review, discussion with nursing staff, consultants, updating family and interview/physical exam; more than 50% of that time was spent in counseling and/or coordination of care.    Joseph Art, DO [facility] Available via Epic secure chat 7am-7pm After these hours, please refer to coverage provider listed on [email or web address] 05/15/2023, 8:48 AM

## 2023-05-16 DIAGNOSIS — E872 Acidosis, unspecified: Secondary | ICD-10-CM | POA: Diagnosis not present

## 2023-05-16 DIAGNOSIS — N179 Acute kidney failure, unspecified: Secondary | ICD-10-CM | POA: Diagnosis not present

## 2023-05-16 LAB — CBC
HCT: 19.8 % — ABNORMAL LOW (ref 39.0–52.0)
Hemoglobin: 7 g/dL — ABNORMAL LOW (ref 13.0–17.0)
MCH: 28.8 pg (ref 26.0–34.0)
MCHC: 35.4 g/dL (ref 30.0–36.0)
MCV: 81.5 fL (ref 80.0–100.0)
Platelets: 270 10*3/uL (ref 150–400)
RBC: 2.43 MIL/uL — ABNORMAL LOW (ref 4.22–5.81)
RDW: 14.4 % (ref 11.5–15.5)
WBC: 6.1 10*3/uL (ref 4.0–10.5)
nRBC: 0 % (ref 0.0–0.2)

## 2023-05-16 LAB — BASIC METABOLIC PANEL
Anion gap: 17 — ABNORMAL HIGH (ref 5–15)
BUN: 139 mg/dL — ABNORMAL HIGH (ref 6–20)
CO2: 19 mmol/L — ABNORMAL LOW (ref 22–32)
Calcium: 7.9 mg/dL — ABNORMAL LOW (ref 8.9–10.3)
Chloride: 101 mmol/L (ref 98–111)
Creatinine, Ser: 10.88 mg/dL — ABNORMAL HIGH (ref 0.61–1.24)
GFR, Estimated: 5 mL/min — ABNORMAL LOW (ref >60)
Glucose, Bld: 296 mg/dL — ABNORMAL HIGH (ref 70–99)
Potassium: 3 mmol/L — ABNORMAL LOW (ref 3.5–5.1)
Sodium: 137 mmol/L (ref 135–145)

## 2023-05-16 LAB — CBC WITH DIFFERENTIAL/PLATELET
Abs Immature Granulocytes: 0.05 10*3/uL (ref 0.00–0.07)
Basophils Absolute: 0 10*3/uL (ref 0.0–0.1)
Basophils Relative: 1 %
Eosinophils Absolute: 0.5 10*3/uL (ref 0.0–0.5)
Eosinophils Relative: 7 %
HCT: 22.5 % — ABNORMAL LOW (ref 39.0–52.0)
Hemoglobin: 7.3 g/dL — ABNORMAL LOW (ref 13.0–17.0)
Immature Granulocytes: 1 %
Lymphocytes Relative: 13 %
Lymphs Abs: 0.8 10*3/uL (ref 0.7–4.0)
MCH: 27.2 pg (ref 26.0–34.0)
MCHC: 32.4 g/dL (ref 30.0–36.0)
MCV: 84 fL (ref 80.0–100.0)
Monocytes Absolute: 0.4 10*3/uL (ref 0.1–1.0)
Monocytes Relative: 6 %
Neutro Abs: 4.7 10*3/uL (ref 1.7–7.7)
Neutrophils Relative %: 72 %
Platelets: 257 10*3/uL (ref 150–400)
RBC: 2.68 MIL/uL — ABNORMAL LOW (ref 4.22–5.81)
RDW: 14.4 % (ref 11.5–15.5)
WBC: 6.5 10*3/uL (ref 4.0–10.5)
nRBC: 0 % (ref 0.0–0.2)

## 2023-05-16 LAB — GLUCOSE, CAPILLARY
Glucose-Capillary: 182 mg/dL — ABNORMAL HIGH (ref 70–99)
Glucose-Capillary: 223 mg/dL — ABNORMAL HIGH (ref 70–99)
Glucose-Capillary: 229 mg/dL — ABNORMAL HIGH (ref 70–99)
Glucose-Capillary: 233 mg/dL — ABNORMAL HIGH (ref 70–99)

## 2023-05-16 LAB — PREPARE RBC (CROSSMATCH)

## 2023-05-16 MED ORDER — RISAQUAD PO CAPS
1.0000 | ORAL_CAPSULE | Freq: Every day | ORAL | Status: DC
  Administered 2023-05-16 – 2023-05-18 (×3): 1 via ORAL
  Filled 2023-05-16 (×3): qty 1

## 2023-05-16 MED ORDER — SODIUM CHLORIDE 0.9% IV SOLUTION: Freq: Once | INTRAVENOUS | Status: AC

## 2023-05-16 MED ORDER — POTASSIUM CHLORIDE CRYS ER 20 MEQ PO TBCR
40.0000 meq | EXTENDED_RELEASE_TABLET | Freq: Once | ORAL | Status: AC
  Administered 2023-05-16: 40 meq via ORAL
  Filled 2023-05-16: qty 2

## 2023-05-16 NOTE — Progress Notes (Signed)
Barry Nichols is an 48 y.o. male with ESRD s/p renal transplant 05/2020, HTN, gout, DM type 2, HTN, OA   p/w 3 week h/o N/V/D - watery diarrhea 3-4/d, cough, malaise, myalgias, sore throat to start.  Amoxicillin from PCP.  Due to lack of improved was re evaluated 1/22 by PCP and rx'd doxycycline.  Out labs showed AKI and was dispatched to ED.  At [facility] found to have Cr 17, bicarb 8, Hb 8.2, plt 307.  Ct renal study with mild hydro of transplanted kidney.  VBG 7.05 / 25.  Given 2L NS, 1 amp bicarb and started on bicarb gtt at 100/hr.  Didn't miss any immunosuppression, No LUTS, hematuria but urine has looked darker.  No NSAIDs. Follows quarterly with [facility] in [place]; transplant was at [facility]. He is interested in re est with Dr. Signe Colt locally who previously followed him.   Assessment/Plan: **ESRD s/p renal transplant 2022 now with severe AKI:  baseline Cr 1.9 as of 01/2023.  Several weeks of poor po intake in the setting of what appeared to be URI +N/V/D - certainly has prerenal component and possibly may have progressed to ATN.  Cont IVF (bicarb gtt).  CT 1/23 with mild transplant hydro but no clear obstruction; will do PVRs today with low threshold for foley placement.   1/22 UA many RBC, many WBC and 100 protein raising concern for other etiologies.    Will check Parvo PCR with hematuria.  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  Hopefully if HD is  needed at all it'll be temporary.  Strict I/Os, daily labs.  Tac level 1/24 collected and in process.     - He still has some diarrhea but it is decreasing in volume.  No absolute indication for dialysis and hopeful he will continue trending in the right direction. - Fortunately no missed immunosuppression but if renal function stagnates does not improve consistently low threshhold for renal biopsy in the coming days.  Earliest would be Monday.  -Renal  function continues to trend in the right direction improved; urine output 1.8L /24h. -Stopped the D5W +3 A of bicarb on 11/25 replacing with oral bicarb; appears to be adequate. Potassium being repleted.   - Rechecking a Tac trough; initial one on 1/23 3.2; pt has been on a stable dose for awhile. Will await repeat drawn 1/24 prior to adjusting.  **Anemia, normocytic:  no overt bleeding. Hb 11/2022 12s now in 7's and stable overnight. Plt count normal and T bili normal. Being transfused   **AGMA: in the setting of severe AKI; on bicarb gtt after 1 amp push.  Check another VBG with lactate this AM.  Continue D5W plus to be as bicarb at 100 mL/h.  Patient still has diarrhea.   **N/V/D: protracted course; stool studies ordered.    **HTN: on home meds for now, hold if BP trends down.    **DM: per primary.   Subjective: Feeling better with less shortness of breath, less diarrhea.   Denies fever chills nausea vomiting.   Chemistry and CBC: Creatinine, Ser  Date/Time Value Ref Range Status  05/16/2023 04:26 AM 10.88 (H) 0.61 - 1.24 mg/dL Final  40/98/1191 47:82 AM 12.82 (H) 0.61 - 1.24 mg/dL Final  95/62/1308 65:78 AM 14.84 (H) 0.61 - 1.24 mg/dL Final  46/96/2952 84:13 AM 16.97 (H) 0.61 - 1.24 mg/dL Final  24/40/1027 25:36 PM  17.35 (H) 0.61 - 1.24 mg/dL Final  16/01/9603 54:09 AM 1.74 (H) 0.61 - 1.24 mg/dL Final  81/19/1478 29:56 AM 9.43 (H) 0.61 - 1.24 mg/dL Final  21/30/8657 84:69 AM 9.05 (H) 0.61 - 1.24 mg/dL Final  62/95/2841 32:44 AM 8.86 (H) 0.61 - 1.24 mg/dL Final  04/22/7251 66:44 AM 2.63 (H) 0.50 - 1.35 mg/dL Final  03/47/4259 56:38 AM 2.71 (H) 0.50 - 1.35 mg/dL Final  75/64/3329 51:88 AM 2.60 (H) 0.50 - 1.35 mg/dL Final  41/66/0630 16:01 AM 2.76 (H) 0.50 - 1.35 mg/dL Final  09/32/3557 32:20 AM 2.70 (H) 0.50 - 1.35 mg/dL Final   Recent Labs  Lab 05/12/23 2030 05/12/23 2037 05/13/23 0537 05/14/23 0605 05/15/23 0433 05/16/23 0426  NA 128* 132* 134* 138 137 137  K 5.1 5.3*  4.4 3.3* 2.9* 3.0*  CL 103  --  107 106 103 101  CO2 8*  --  <7* 14* 16* 19*  GLUCOSE 166*  --  167* 160* 173* 296*  BUN 171*  --  175* 166* 154* 139*  CREATININE 17.35*  --  16.97* 14.84* 12.82* 10.88*  CALCIUM 8.9  --  8.6* 8.3* 7.9* 7.9*  PHOS  --   --  10.4* 8.7*  --   --    Recent Labs  Lab 05/12/23 2030 05/12/23 2037 05/13/23 0539 05/14/23 0605 05/15/23 0433 05/16/23 0426  WBC 7.9  --  6.7 6.1 5.4 6.1  NEUTROABS 6.4  --  5.4  --   --   --   HGB 8.2*   < > 7.7* 7.3* 7.4* 7.0*  HCT 25.0*   < > 24.6* 21.9* 21.7* 19.8*  MCV 85.6  --  89.5 83.3 82.8 81.5  PLT 307  --  283 272 292 270   < > = values in this interval not displayed.   Liver Function Tests: Recent Labs  Lab 05/12/23 2030 05/13/23 0537 05/14/23 0605  AST 42*  --   --   ALT 59*  --   --   ALKPHOS 38  --   --   BILITOT 0.6  --   --   PROT 6.7  --   --   ALBUMIN 3.1* 2.7* 2.6*   No results for input(s): "LIPASE", "AMYLASE" in the last 168 hours. No results for input(s): "AMMONIA" in the last 168 hours. Cardiac Enzymes: Recent Labs  Lab 05/13/23 0731  CKTOTAL 231   Iron Studies:  No results for input(s): "IRON", "TIBC", "TRANSFERRIN", "FERRITIN" in the last 72 hours.  PT/INR: @LABRCNTIP (inr:5)  Xrays/Other Studies: ) Results for orders placed or performed during the hospital encounter of 05/12/23 (from the past 48 hours)  Glucose, capillary     Status: Abnormal   Collection Time: 05/14/23 12:49 PM  Result Value Ref Range   Glucose-Capillary 210 (H) 70 - 99 mg/dL    Comment: Glucose reference range applies only to samples taken after fasting for at least 8 hours.  Glucose, capillary     Status: Abnormal   Collection Time: 05/14/23  4:28 PM  Result Value Ref Range   Glucose-Capillary 259 (H) 70 - 99 mg/dL    Comment: Glucose reference range applies only to samples taken after fasting for at least 8 hours.  Glucose, capillary     Status: Abnormal   Collection Time: 05/14/23  9:51 PM  Result  Value Ref Range   Glucose-Capillary 210 (H) 70 - 99 mg/dL    Comment: Glucose reference range applies only to samples taken after fasting  for at least 8 hours.   Comment 1 Notify RN    Comment 2 Document in Chart   CBC     Status: Abnormal   Collection Time: 05/15/23  4:33 AM  Result Value Ref Range   WBC 5.4 4.0 - 10.5 K/uL   RBC 2.62 (L) 4.22 - 5.81 MIL/uL   Hemoglobin 7.4 (L) 13.0 - 17.0 g/dL   HCT 29.5 (L) 62.1 - 30.8 %   MCV 82.8 80.0 - 100.0 fL   MCH 28.2 26.0 - 34.0 pg   MCHC 34.1 30.0 - 36.0 g/dL   RDW 65.7 84.6 - 96.2 %   Platelets 292 150 - 400 K/uL   nRBC 0.0 0.0 - 0.2 %    Comment: Performed at [facility], [address], [place], [place] [address]  Basic metabolic panel     Status: Abnormal   Collection Time: 05/15/23  4:33 AM  Result Value Ref Range   Sodium 137 135 - 145 mmol/L   Potassium 2.9 (L) 3.5 - 5.1 mmol/L   Chloride 103 98 - 111 mmol/L   CO2 16 (L) 22 - 32 mmol/L   Glucose, Bld 173 (H) 70 - 99 mg/dL    Comment: Glucose reference range applies only to samples taken after fasting for at least 8 hours.   BUN 154 (H) 6 - 20 mg/dL   Creatinine, Ser 13.24 (H) 0.61 - 1.24 mg/dL   Calcium 7.9 (L) 8.9 - 10.3 mg/dL   GFR, Estimated 4 (L) >60 mL/min    Comment: (NOTE) Calculated using the CKD-EPI Creatinine Equation (2021)    Anion gap 18 (H) 5 - 15    Comment: Performed at [facility], [address], [place], [place] [address]  Glucose, capillary     Status: Abnormal   Collection Time: 05/15/23  8:40 AM  Result Value Ref Range   Glucose-Capillary 148 (H) 70 - 99 mg/dL    Comment: Glucose reference range applies only to samples taken after fasting for at least 8 hours.  Glucose, capillary     Status: Abnormal   Collection Time: 05/15/23  1:09 PM  Result Value Ref Range   Glucose-Capillary 281 (H) 70 - 99 mg/dL    Comment: Glucose reference range applies only to samples taken after fasting for at least 8 hours.  CBC     Status: Abnormal    Collection Time: 05/16/23  4:26 AM  Result Value Ref Range   WBC 6.1 4.0 - 10.5 K/uL   RBC 2.43 (L) 4.22 - 5.81 MIL/uL   Hemoglobin 7.0 (L) 13.0 - 17.0 g/dL    Comment: REPEATED TO VERIFY   HCT 19.8 (L) 39.0 - 52.0 %   MCV 81.5 80.0 - 100.0 fL   MCH 28.8 26.0 - 34.0 pg   MCHC 35.4 30.0 - 36.0 g/dL   RDW 72.5 36.6 - 44.0 %   Platelets 270 150 - 400 K/uL   nRBC 0.0 0.0 - 0.2 %    Comment: Performed at [facility], [address] [place], [place] [address]  Basic metabolic panel     Status: Abnormal   Collection Time: 05/16/23  4:26 AM  Result Value Ref Range   Sodium 137 135 - 145 mmol/L   Potassium 3.0 (L) 3.5 - 5.1 mmol/L   Chloride 101 98 - 111 mmol/L   CO2 19 (L) 22 - 32 mmol/L   Glucose, Bld 296 (H) 70 - 99 mg/dL  Comment: Glucose reference range applies only to samples taken after fasting for at least 8 hours.   BUN 139 (H) 6 - 20 mg/dL   Creatinine, Ser 40.98 (H) 0.61 - 1.24 mg/dL   Calcium 7.9 (L) 8.9 - 10.3 mg/dL   GFR, Estimated 5 (L) >60 mL/min    Comment: (NOTE) Calculated using the CKD-EPI Creatinine Equation (2021)    Anion gap 17 (H) 5 - 15    Comment: Performed at [facility], [address], [place], [place] [address]  Glucose, capillary     Status: Abnormal   Collection Time: 05/16/23  8:10 AM  Result Value Ref Range   Glucose-Capillary 182 (H) 70 - 99 mg/dL    Comment: Glucose reference range applies only to samples taken after fasting for at least 8 hours.   No results found.   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, Bilateral   Migraine    "when I was  young" (08/25/2016"   Posterior equinus, acquired    Bilateral   Pre-diabetes     PSH:   Past Surgical History:  Procedure Laterality Date   AV FISTULA PLACEMENT Left 10/15/2016   Procedure: ARTERIOVENOUS (AV) FISTULA CREATION-LEFT ARM;  Surgeon: Fransisco Hertz, MD;  Location: [facility] OR;  Service: Vascular;  Laterality: Left;   DECOMPRESSION HIP-CORE Left 12/07/2022   Procedure: LEFT HIP CORE DECOMPRESSION WITH ILIAC CREST BONE MARROW ASPIRATE;  Surgeon: Huel Cote, MD;  Location: [facility] OR;  Service: Orthopedics;  Laterality: Left;   INSERTION OF DIALYSIS CATHETER Right 10/15/2016   Procedure: INSERTION OF DIALYSIS CATHETER;  Surgeon: Fransisco Hertz, MD;  Location: [facility] OR;  Service: Vascular;  Laterality: Right;   KIDNEY TRANSPLANT  06/15/2020   [facility]   WISDOM TOOTH EXTRACTION      Allergies: No Known Allergies  Medications:   Prior to Admission medications   Medication Sig Start Date End Date Taking? Authorizing Provider  amLODipine (NORVASC) 10 MG tablet Take 1 tablet (10 mg total) by mouth at bedtime. 09/14/11 05/13/23 Yes Lonia Blood, MD  carvedilol (COREG) 25 MG tablet Take 1 tablet (25 mg total) by mouth 2 (two) times daily with a meal. 09/14/11 05/13/23 Yes Lonia Blood, MD  cinacalcet (SENSIPAR) 30 MG tablet Take 30 mg by mouth daily.   Yes [provider]  cloNIDine (CATAPRES) 0.3 MG tablet Take 1 tablet (0.3 mg total) by mouth 3 (three) times daily. Patient taking differently: Take 0.3 mg by mouth 2 (two) times daily. 08/27/16  Yes Arnetha Courser, MD  doxycycline (VIBRAMYCIN) 100 MG capsule Take 100 mg by mouth 2 (two) times daily. For 7 days. Take with 8oz of water. Do not lie down for at least 30 minutes after. 05/12/23 05/19/23 Yes [provider]  fenofibrate (TRICOR) 145 MG tablet Take 145 mg by mouth daily.   Yes [provider]  icosapent Ethyl (VASCEPA) 1 g capsule Take 2 g by mouth 2 (two) times daily.  Yes [provider]  insulin degludec (TRESIBA) 200 UNIT/ML FlexTouch Pen Inject 30 Units into the skin at bedtime. 09/23/21  Yes [provider]  insulin lispro (HUMALOG KWIKPEN) 200 UNIT/ML KwikPen Inject 25-40 Units into the skin 3 (three) times daily as needed (high blood sugar). 09/02/20  Yes [provider]  MOUNJARO 15 MG/0.5ML Pen Inject 15 mg into the skin once a week.   Yes [provider]  mycophenolate (CELLCEPT) 500 MG tablet Take 1,000 mg by mouth 2 (two) times daily.   Yes [provider]  predniSONE (DELTASONE) 5 MG tablet Take 5 mg by mouth daily.   Yes [provider]  sulfamethoxazole-trimethoprim (BACTRIM) 400-80 MG tablet Take 1 tablet by mouth every Monday, Wednesday, and Friday.   Yes [provider]  tacrolimus (PROGRAF) 1 MG capsule Take 2-3 mg by mouth See admin instructions. Take 3 capsules (3mg ) in the morning and then take 2 capsules (2mg ) at bedtime.   Yes [provider]  tadalafil (CIALIS) 20 MG tablet Take 20 mg by mouth daily as needed for erectile dysfunction.   Yes [provider]  traZODone (DESYREL) 50 MG tablet Take 50 mg by mouth at bedtime.   Yes [provider]  Vitamin D, Ergocalciferol, (DRISDOL) 1.25 MG (50000 UNIT) CAPS capsule Take 50,000 Units by mouth every 7 (seven) days. 03/02/23  Yes [provider]  aspirin EC 325 MG tablet Take 1 tablet (325 mg total) by mouth daily. Patient not taking: Reported on 11/19/2022 10/19/22   Huel Cote, MD  oxyCODONE (ROXICODONE) 5 MG immediate release tablet Take 1 tablet (5 mg total) by mouth every 4 (four) hours as needed for severe pain or breakthrough pain. Patient not taking: Reported on 05/13/2023 10/19/22   Huel Cote, MD    Discontinued Meds:   Medications Discontinued During This Encounter  Medication Reason   tacrolimus (PROGRAF) capsule 3 mg    sodium bicarbonate 150 mEq in dextrose 5 % 1,150 mL infusion     Chlorhexidine Gluconate Cloth 2 % PADS 6 each     Social History:  reports that he has never smoked. He has never used smokeless tobacco. He reports that he does not drink alcohol and does not use drugs.  Family History:   Family History  Problem Relation Age of Onset   Hypertension Father        Also maternal grandmother   Diabetes Father        also maternal grandmother   CVA Father    Cancer Maternal Grandfather        lung   Heart attack Maternal Grandfather     Blood pressure (!) 151/81, pulse 86, temperature 98.1 F (36.7 C), temperature source Oral, resp. rate 16, height 5\' 10"  (1.778 m), weight 107.7 kg, SpO2 98%. Physical Exam: Gen: tired but comfortable appearing in bed  Eyes: anicteric, no conjunctival injection, EOMI Neck: supple, thick CV:  tachycardic, regular Abd:  soft, nontender, mildly distended, NABS, RLQ transplant nontender Lungs: clear ant, no coughing during interview GU: no foley Extr:  no edema, no joint effusions; L Cimino AVF +t/b - feels useable Neuro: nonfocal Skin: no rashes noted     Ethelene Hal, MD 05/16/2023, 11:25 AM

## 2023-05-16 NOTE — Progress Notes (Signed)
PROGRESS NOTE    Barry Nichols  QMV:[id number] DOB: 06-13-1975 DOA: 05/12/2023 PCP: Virgilio Belling, PA-C    Brief Narrative:  Barry Nichols is a 48 y.o. male with history of renal transplant in February 2022 being followed by nephrologist at [facility], diabetes mellitus type 2, hypertension was referred to the ER by primary care physician after labs showed acute worsening of patient's renal functions.  Patient states he has been feeling poorly since neither issue 3 weeks ago.  He has been having intermittent episodes of diarrhea vomiting and also had some symptoms concerning for upper respiratory infection.  Patient was placed on antibiotics (amoxicillin) by primary care physician about a week ago course of which patient completed and had presented again to the PCP yesterday and was placed on another course of antibiotics (doxycycline) and had blood drawn which showed acute renal failure.  Patient denies taking any NSAIDs and has been compliant with his antirejection medications including tacrolimus mycophenolate and prednisone and Bactrim.  Cr slowly getting better.   Assessment and Plan: Acute renal failure in renal transplanted kidney with metabolic acidosis -    -renal consulted-- no indication for dialysis and hopeful he will continue trending in the right direction. - Fortunately no missed immunosuppression but if renal function stagnates does not improve consistently low threshhold for renal biopsy in the coming days.  Earliest would be Monday.  -daily labs-- CR trending down  Anemia  -trend, transfuse for <7-- patient wants to wait on transfusion for now-- may allow later today  Hypertension  -on clonidine, carvedilol, and amlodipine.  Diabetes mellitus type 2 takes [place] 20 units which patient has not taken the last 1 week due to poor appetite.   -SSI  Hypokalemia -replete -for now will change diet  Insomnia -added PRN  meds   DVT prophylaxis: heparin injection 5,000 Units Start: 05/13/23 1400    Code Status: Full Code   Disposition Plan:  Level of care: Med-Surg Status is: Inpatient     Consultants:  renal   Subjective: Scared to have an "accident" so he has not been sleeping  Objective: Vitals:   05/15/23 2321 05/16/23 0307 05/16/23 0500 05/16/23 0809  BP: (!) 144/75 (!) 143/79  (!) 151/81  Pulse: 95 90  86  Resp: 18 16    Temp: 98.9 F (37.2 C) 98.3 F (36.8 C)  98.1 F (36.7 C)  TempSrc: Oral Oral  Oral  SpO2: 100% 98%  98%  Weight:   107.7 kg   Height:        Intake/Output Summary (Last 24 hours) at 05/16/2023 1156 Last data filed at 05/16/2023 0815 Gross per 24 hour  Intake 480 ml  Output 1625 ml  Net -1145 ml   Filed Weights   05/12/23 1852 05/15/23 0500 05/16/23 0500  Weight: 107.5 kg 105 kg 107.7 kg    Examination:   General: Appearance:    Obese male in no acute distress     Lungs:     respirations unlabored  Heart:    Normal heart rate.    MS:   All extremities are intact.   Neurologic:   Awake, alert      Data Reviewed: I have personally reviewed following labs and imaging studies  CBC: Recent Labs  Lab 05/12/23 2030 05/12/23 2037 05/13/23 0539 05/14/23 0605 05/15/23 0433 05/16/23 0426  WBC 7.9  --  6.7 6.1 5.4 6.1  NEUTROABS 6.4  --  5.4  --   --   --  HGB 8.2* 8.8* 7.7* 7.3* 7.4* 7.0*  HCT 25.0* 26.0* 24.6* 21.9* 21.7* 19.8*  MCV 85.6  --  89.5 83.3 82.8 81.5  PLT 307  --  283 272 292 270   Basic Metabolic Panel: Recent Labs  Lab 05/12/23 2030 05/12/23 2037 05/13/23 0537 05/14/23 0605 05/15/23 0433 05/16/23 0426  NA 128* 132* 134* 138 137 137  K 5.1 5.3* 4.4 3.3* 2.9* 3.0*  CL 103  --  107 106 103 101  CO2 8*  --  <7* 14* 16* 19*  GLUCOSE 166*  --  167* 160* 173* 296*  BUN 171*  --  175* 166* 154* 139*  CREATININE 17.35*  --  16.97* 14.84* 12.82* 10.88*  CALCIUM 8.9  --  8.6* 8.3* 7.9* 7.9*  PHOS  --   --  10.4* 8.7*  --    --    GFR: Estimated Creatinine Clearance: 10.4 mL/min (A) (by C-G formula based on SCr of 10.88 mg/dL (H)). Liver Function Tests: Recent Labs  Lab 05/12/23 2030 05/13/23 0537 05/14/23 0605  AST 42*  --   --   ALT 59*  --   --   ALKPHOS 38  --   --   BILITOT 0.6  --   --   PROT 6.7  --   --   ALBUMIN 3.1* 2.7* 2.6*   No results for input(s): "LIPASE", "AMYLASE" in the last 168 hours. No results for input(s): "AMMONIA" in the last 168 hours. Coagulation Profile: No results for input(s): "INR", "PROTIME" in the last 168 hours. Cardiac Enzymes: Recent Labs  Lab 05/13/23 0731  CKTOTAL 231   BNP (last 3 results) No results for input(s): "PROBNP" in the last 8760 hours. HbA1C: No results for input(s): "HGBA1C" in the last 72 hours. CBG: Recent Labs  Lab 05/14/23 1628 05/14/23 2151 05/15/23 0840 05/15/23 1309 05/16/23 0810  GLUCAP 259* 210* 148* 281* 182*   Lipid Profile: No results for input(s): "CHOL", "HDL", "LDLCALC", "TRIG", "CHOLHDL", "LDLDIRECT" in the last 72 hours. Thyroid Function Tests: No results for input(s): "TSH", "T4TOTAL", "FREET4", "T3FREE", "THYROIDAB" in the last 72 hours. Anemia Panel: No results for input(s): "VITAMINB12", "FOLATE", "FERRITIN", "TIBC", "IRON", "RETICCTPCT" in the last 72 hours.  Sepsis Labs: Recent Labs  Lab 05/13/23 0734  LATICACIDVEN 0.5    Recent Results (from the past 240 hours)  Urine Culture     Status: Abnormal   Collection Time: 05/12/23  3:12 AM   Specimen: Urine, Random  Result Value Ref Range Status   Specimen Description URINE, RANDOM  Final   Special Requests NONE Reflexed from [id number]  Final   Culture (A)  Final    <10,000 COLONIES/mL INSIGNIFICANT GROWTH Performed at [facility], [address], [place], [place] [address]    Report Status 05/14/2023 FINAL  Final  Resp panel by RT-PCR (RSV, Flu A&B, Covid) Anterior Nasal Swab     Status: None   Collection Time: 05/12/23  8:08 PM   Specimen:  Anterior Nasal Swab  Result Value Ref Range Status   SARS Coronavirus 2 by RT PCR NEGATIVE NEGATIVE Final    Comment: (NOTE) SARS-CoV-2 target nucleic acids are NOT DETECTED.  The SARS-CoV-2 RNA is generally detectable in upper respiratory specimens during the acute phase of infection. The lowest concentration of SARS-CoV-2 viral copies this assay can detect is 138 copies/mL. A negative result does not preclude SARS-Cov-2 infection and should not be used as the sole basis for treatment or other patient management decisions.  A negative result may occur with  improper specimen collection/handling, submission of specimen other than nasopharyngeal swab, presence of viral mutation(s) within the areas targeted by this assay, and inadequate number of viral copies(<138 copies/mL). A negative result must be combined with clinical observations, patient history, and epidemiological information. The expected result is Negative.  Fact Sheet for Patients:  BloggerCourse.com  Fact Sheet for Healthcare Providers:  SeriousBroker.it  This test is no t yet approved or cleared by the [place] FDA and  has been authorized for detection and/or diagnosis of SARS-CoV-2 by FDA under an Emergency Use Authorization (EUA). This EUA will remain  in effect (meaning this test can be used) for the duration of the COVID-19 declaration under Section 564(b)(1) of the Act, 21 U.S.C.section 360bbb-3(b)(1), unless the authorization is terminated  or revoked sooner.       Influenza A by PCR NEGATIVE NEGATIVE Final   Influenza B by PCR NEGATIVE NEGATIVE Final    Comment: (NOTE) The Xpert Xpress SARS-CoV-2/FLU/RSV plus assay is intended as an aid in the diagnosis of influenza from Nasopharyngeal swab specimens and should not be used as a sole basis for treatment. Nasal washings and aspirates are unacceptable for Xpert Xpress SARS-CoV-2/FLU/RSV testing.  Fact  Sheet for Patients: BloggerCourse.com  Fact Sheet for Healthcare Providers: SeriousBroker.it  This test is not yet approved or cleared by the [place] FDA and has been authorized for detection and/or diagnosis of SARS-CoV-2 by FDA under an Emergency Use Authorization (EUA). This EUA will remain in effect (meaning this test can be used) for the duration of the COVID-19 declaration under Section 564(b)(1) of the Act, 21 U.S.C. section 360bbb-3(b)(1), unless the authorization is terminated or revoked.     Resp Syncytial Virus by PCR NEGATIVE NEGATIVE Final    Comment: (NOTE) Fact Sheet for Patients: BloggerCourse.com  Fact Sheet for Healthcare Providers: SeriousBroker.it  This test is not yet approved or cleared by the [place] FDA and has been authorized for detection and/or diagnosis of SARS-CoV-2 by FDA under an Emergency Use Authorization (EUA). This EUA will remain in effect (meaning this test can be used) for the duration of the COVID-19 declaration under Section 564(b)(1) of the Act, 21 U.S.C. section 360bbb-3(b)(1), unless the authorization is terminated or revoked.  Performed at [facility], [address] [place], [place] [address]   Gastrointestinal Panel by PCR , Stool     Status: None   Collection Time: 05/13/23  7:14 PM   Specimen: Stool  Result Value Ref Range Status   Campylobacter species NOT DETECTED NOT DETECTED Final   Plesimonas shigelloides NOT DETECTED NOT DETECTED Final   Salmonella species NOT DETECTED NOT DETECTED Final   Yersinia enterocolitica NOT DETECTED NOT DETECTED Final   Vibrio species NOT DETECTED NOT DETECTED Final   Vibrio cholerae NOT DETECTED NOT DETECTED Final   Enteroaggregative E coli (EAEC) NOT DETECTED NOT DETECTED Final   Enteropathogenic E coli (EPEC) NOT DETECTED NOT DETECTED Final   Enterotoxigenic E coli  (ETEC) NOT DETECTED NOT DETECTED Final   Shiga like toxin producing E coli (STEC) NOT DETECTED NOT DETECTED Final   Shigella/Enteroinvasive E coli (EIEC) NOT DETECTED NOT DETECTED Final   Cryptosporidium NOT DETECTED NOT DETECTED Final   Cyclospora cayetanensis NOT DETECTED NOT DETECTED Final   Entamoeba histolytica NOT DETECTED NOT DETECTED Final   Giardia lamblia NOT DETECTED NOT DETECTED Final   Adenovirus F40/41 NOT DETECTED NOT DETECTED Final   Astrovirus NOT DETECTED NOT DETECTED  Final   Norovirus GI/GII NOT DETECTED NOT DETECTED Final   Rotavirus A NOT DETECTED NOT DETECTED Final   Sapovirus (I, II, IV, and V) NOT DETECTED NOT DETECTED Final    Comment: Performed at [facility], [address], [place], [place] [address]         Radiology Studies: No results found.       Scheduled Meds:  acidophilus  1 capsule Oral Daily   amLODipine  10 mg Oral QHS   carvedilol  25 mg Oral BID WC   cloNIDine  0.3 mg Oral BID   folic acid  1 mg Oral Daily   heparin  5,000 Units Subcutaneous Q8H   insulin aspart  0-6 Units Subcutaneous TID WC   insulin glargine-yfgn  10 Units Subcutaneous Daily   mycophenolate  1,000 mg Oral BID   oxymetazoline  1 spray Each Nare BID   predniSONE  5 mg Oral Q breakfast   sodium bicarbonate  1,300 mg Oral TID   tacrolimus  3 mg Oral q morning   And   tacrolimus  2 mg Oral QPM   Continuous Infusions:     LOS: 4 days    Time spent: 45 minutes spent on chart review, discussion with nursing staff, consultants, updating family and interview/physical exam; more than 50% of that time was spent in counseling and/or coordination of care.    Joseph Art, DO [facility] Available via Epic secure chat 7am-7pm After these hours, please refer to coverage provider listed on [email or web address] 05/16/2023, 11:56 AM

## 2023-05-17 DIAGNOSIS — E872 Acidosis, unspecified: Secondary | ICD-10-CM | POA: Diagnosis not present

## 2023-05-17 DIAGNOSIS — N179 Acute kidney failure, unspecified: Secondary | ICD-10-CM | POA: Diagnosis not present

## 2023-05-17 LAB — BASIC METABOLIC PANEL
Anion gap: 17 — ABNORMAL HIGH (ref 5–15)
BUN: 125 mg/dL — ABNORMAL HIGH (ref 6–20)
CO2: 18 mmol/L — ABNORMAL LOW (ref 22–32)
Calcium: 8.2 mg/dL — ABNORMAL LOW (ref 8.9–10.3)
Chloride: 100 mmol/L (ref 98–111)
Creatinine, Ser: 10.16 mg/dL — ABNORMAL HIGH (ref 0.61–1.24)
GFR, Estimated: 6 mL/min — ABNORMAL LOW (ref >60)
Glucose, Bld: 225 mg/dL — ABNORMAL HIGH (ref 70–99)
Potassium: 3.1 mmol/L — ABNORMAL LOW (ref 3.5–5.1)
Sodium: 135 mmol/L (ref 135–145)

## 2023-05-17 LAB — GLUCOSE, CAPILLARY
Glucose-Capillary: 170 mg/dL — ABNORMAL HIGH (ref 70–99)
Glucose-Capillary: 224 mg/dL — ABNORMAL HIGH (ref 70–99)
Glucose-Capillary: 271 mg/dL — ABNORMAL HIGH (ref 70–99)
Glucose-Capillary: 286 mg/dL — ABNORMAL HIGH (ref 70–99)

## 2023-05-17 LAB — TYPE AND SCREEN
ABO/RH(D): O POS
Antibody Screen: NEGATIVE
Unit division: 0

## 2023-05-17 LAB — CBC
HCT: 21.2 % — ABNORMAL LOW (ref 39.0–52.0)
Hemoglobin: 7.1 g/dL — ABNORMAL LOW (ref 13.0–17.0)
MCH: 28.2 pg (ref 26.0–34.0)
MCHC: 33.5 g/dL (ref 30.0–36.0)
MCV: 84.1 fL (ref 80.0–100.0)
Platelets: 250 10*3/uL (ref 150–400)
RBC: 2.52 MIL/uL — ABNORMAL LOW (ref 4.22–5.81)
RDW: 14.6 % (ref 11.5–15.5)
WBC: 7 10*3/uL (ref 4.0–10.5)
nRBC: 0 % (ref 0.0–0.2)

## 2023-05-17 LAB — HEMOGLOBIN AND HEMATOCRIT, BLOOD
HCT: 21.5 % — ABNORMAL LOW (ref 39.0–52.0)
Hemoglobin: 7.4 g/dL — ABNORMAL LOW (ref 13.0–17.0)

## 2023-05-17 LAB — BPAM RBC
Blood Product Expiration Date: 202502152359
ISSUE DATE / TIME: 202501261337
Unit Type and Rh: 5100

## 2023-05-17 LAB — TACROLIMUS LEVEL: Tacrolimus (FK506) - LabCorp: 3.5 ng/mL (ref 2.0–20.0)

## 2023-05-17 LAB — MAGNESIUM: Magnesium: 1.1 mg/dL — ABNORMAL LOW (ref 1.7–2.4)

## 2023-05-17 MED ORDER — MAGNESIUM SULFATE 4 GM/100ML IV SOLN
4.0000 g | Freq: Once | INTRAVENOUS | Status: AC
Start: 2023-05-17 — End: 2023-05-17
  Administered 2023-05-17: 4 g via INTRAVENOUS
  Filled 2023-05-17: qty 100

## 2023-05-17 MED ORDER — POTASSIUM CHLORIDE CRYS ER 20 MEQ PO TBCR
40.0000 meq | EXTENDED_RELEASE_TABLET | Freq: Once | ORAL | Status: AC
Start: 2023-05-17 — End: 2023-05-17
  Administered 2023-05-17: 40 meq via ORAL
  Filled 2023-05-17: qty 2

## 2023-05-17 MED ORDER — INSULIN GLARGINE-YFGN 100 UNIT/ML ~~LOC~~ SOLN
20.0000 [IU] | Freq: Every day | SUBCUTANEOUS | Status: DC
  Administered 2023-05-18: 20 [IU] via SUBCUTANEOUS
  Filled 2023-05-17: qty 0.2

## 2023-05-17 MED ORDER — MAGNESIUM SULFATE 2 GM/50ML IV SOLN: 2.0000 g | Freq: Once | INTRAVENOUS | Status: DC

## 2023-05-17 NOTE — Progress Notes (Addendum)
PROGRESS NOTE    Barry Nichols  ZOX:[id number] DOB: 1975-08-16 DOA: 05/12/2023 PCP: Virgilio Belling, PA-C    Brief Narrative:  Barry Nichols is a 48 y.o. male with history of renal transplant in February 2022 being followed by nephrologist at [facility], diabetes mellitus type 2, hypertension was referred to the ER by primary care physician after labs showed acute worsening of patient's renal functions.  Patient states he has been feeling poorly for 3 weeks.  He has been having intermittent episodes of diarrhea vomiting and also had some symptoms concerning for upper respiratory infection.  Patient was placed on antibiotics (amoxicillin) by primary care physician about a week ago course of which patient completed and had presented again to the PCP yesterday and was placed on another course of antibiotics (doxycycline) and had blood drawn which showed acute renal failure.  Patient denies taking any NSAIDs and has been compliant with his antirejection medications including tacrolimus mycophenolate and prednisone and Bactrim.  Cr slowly getting better.   Assessment and Plan: Acute renal failure in renal transplanted kidney with metabolic acidosis -    -renal consulted-- no indication for dialysis and hopeful he will continue trending in the right direction. -daily labs-- CR trending down-- suspect several more days  Anemia  -transfused x 1 unit-- recheck h/h -no sign of bleeding  Hypertension  -on clonidine, carvedilol, and amlodipine.  Diabetes mellitus type 2 takes [place] 20 units which patient has not taken the last 1 week due to poor appetite.   -SSI  Hypokalemia/hypomagnesemia -replete PO and IV -recheck in AM  Insomnia -added PRN meds   DVT prophylaxis: heparin injection 5,000 Units Start: 05/13/23 1400    Code Status: Full Code   Disposition Plan:  Level of care: Med-Surg Status is: Inpatient     Consultants:   renal   Subjective: Still not sleeping well  Objective: Vitals:   05/16/23 1953 05/16/23 2351 05/17/23 0317 05/17/23 0815  BP: (!) 141/89 135/83 136/83 (!) 151/91  Pulse: 84 89 87 82  Resp: 18 20 18    Temp: 98.5 F (36.9 C) 98.4 F (36.9 C) 98.6 F (37 C) 98.6 F (37 C)  TempSrc:  Oral Oral Oral  SpO2: 98% 98% 99% 97%  Weight:      Height:        Intake/Output Summary (Last 24 hours) at 05/17/2023 1146 Last data filed at 05/17/2023 0300 Gross per 24 hour  Intake 874.92 ml  Output 1250 ml  Net -375.08 ml   Filed Weights   05/12/23 1852 05/15/23 0500 05/16/23 0500  Weight: 107.5 kg 105 kg 107.7 kg    Examination:   General: Appearance:    Obese male in no acute distress     Lungs:     respirations unlabored  Heart:    Normal heart rate.    MS:   All extremities are intact.   Neurologic:   Awake, alert      Data Reviewed: I have personally reviewed following labs and imaging studies  CBC: Recent Labs  Lab 05/12/23 2030 05/12/23 2037 05/13/23 0539 05/14/23 0605 05/15/23 0433 05/16/23 0426 05/16/23 1937 05/17/23 0434 05/17/23 0813  WBC 7.9  --  6.7 6.1 5.4 6.1 6.5 7.0  --   NEUTROABS 6.4  --  5.4  --   --   --  4.7  --   --   HGB 8.2*   < > 7.7* 7.3* 7.4* 7.0* 7.3* 7.1* 7.4*  HCT 25.0*   < > 24.6* 21.9* 21.7* 19.8* 22.5* 21.2* 21.5*  MCV 85.6  --  89.5 83.3 82.8 81.5 84.0 84.1  --   PLT 307  --  283 272 292 270 257 250  --    < > = values in this interval not displayed.   Basic Metabolic Panel: Recent Labs  Lab 05/13/23 0537 05/14/23 0605 05/15/23 0433 05/16/23 0426 05/17/23 0434 05/17/23 0900  NA 134* 138 137 137 135  --   K 4.4 3.3* 2.9* 3.0* 3.1*  --   CL 107 106 103 101 100  --   CO2 <7* 14* 16* 19* 18*  --   GLUCOSE 167* 160* 173* 296* 225*  --   BUN 175* 166* 154* 139* 125*  --   CREATININE 16.97* 14.84* 12.82* 10.88* 10.16*  --   CALCIUM 8.6* 8.3* 7.9* 7.9* 8.2*  --   MG  --   --   --   --   --  1.1*  PHOS 10.4* 8.7*  --   --    --   --    GFR: Estimated Creatinine Clearance: 11.2 mL/min (A) (by C-G formula based on SCr of 10.16 mg/dL (H)). Liver Function Tests: Recent Labs  Lab 05/12/23 2030 05/13/23 0537 05/14/23 0605  AST 42*  --   --   ALT 59*  --   --   ALKPHOS 38  --   --   BILITOT 0.6  --   --   PROT 6.7  --   --   ALBUMIN 3.1* 2.7* 2.6*   No results for input(s): "LIPASE", "AMYLASE" in the last 168 hours. No results for input(s): "AMMONIA" in the last 168 hours. Coagulation Profile: No results for input(s): "INR", "PROTIME" in the last 168 hours. Cardiac Enzymes: Recent Labs  Lab 05/13/23 0731  CKTOTAL 231   BNP (last 3 results) No results for input(s): "PROBNP" in the last 8760 hours. HbA1C: No results for input(s): "HGBA1C" in the last 72 hours. CBG: Recent Labs  Lab 05/16/23 0810 05/16/23 1208 05/16/23 1652 05/16/23 2203 05/17/23 0815  GLUCAP 182* 223* 229* 233* 170*   Lipid Profile: No results for input(s): "CHOL", "HDL", "LDLCALC", "TRIG", "CHOLHDL", "LDLDIRECT" in the last 72 hours. Thyroid Function Tests: No results for input(s): "TSH", "T4TOTAL", "FREET4", "T3FREE", "THYROIDAB" in the last 72 hours. Anemia Panel: No results for input(s): "VITAMINB12", "FOLATE", "FERRITIN", "TIBC", "IRON", "RETICCTPCT" in the last 72 hours.  Sepsis Labs: Recent Labs  Lab 05/13/23 0734  LATICACIDVEN 0.5    Recent Results (from the past 240 hours)  Urine Culture     Status: Abnormal   Collection Time: 05/12/23  3:12 AM   Specimen: Urine, Random  Result Value Ref Range Status   Specimen Description URINE, RANDOM  Final   Special Requests NONE Reflexed from [id number]  Final   Culture (A)  Final    <10,000 COLONIES/mL INSIGNIFICANT GROWTH Performed at [facility], [address] [place], [place] [address]    Report Status 05/14/2023 FINAL  Final  Resp panel by RT-PCR (RSV, Flu A&B, Covid) Anterior Nasal Swab     Status: None   Collection Time: 05/12/23  8:08 PM   Specimen:  Anterior Nasal Swab  Result Value Ref Range Status   SARS Coronavirus 2 by RT PCR NEGATIVE NEGATIVE Final    Comment: (NOTE) SARS-CoV-2 target nucleic acids are NOT DETECTED.  The SARS-CoV-2 RNA is generally detectable in upper respiratory specimens during the acute  phase of infection. The lowest concentration of SARS-CoV-2 viral copies this assay can detect is 138 copies/mL. A negative result does not preclude SARS-Cov-2 infection and should not be used as the sole basis for treatment or other patient management decisions. A negative result may occur with  improper specimen collection/handling, submission of specimen other than nasopharyngeal swab, presence of viral mutation(s) within the areas targeted by this assay, and inadequate number of viral copies(<138 copies/mL). A negative result must be combined with clinical observations, patient history, and epidemiological information. The expected result is Negative.  Fact Sheet for Patients:  BloggerCourse.com  Fact Sheet for Healthcare Providers:  SeriousBroker.it  This test is no t yet approved or cleared by the [place] FDA and  has been authorized for detection and/or diagnosis of SARS-CoV-2 by FDA under an Emergency Use Authorization (EUA). This EUA will remain  in effect (meaning this test can be used) for the duration of the COVID-19 declaration under Section 564(b)(1) of the Act, 21 U.S.C.section 360bbb-3(b)(1), unless the authorization is terminated  or revoked sooner.       Influenza A by PCR NEGATIVE NEGATIVE Final   Influenza B by PCR NEGATIVE NEGATIVE Final    Comment: (NOTE) The Xpert Xpress SARS-CoV-2/FLU/RSV plus assay is intended as an aid in the diagnosis of influenza from Nasopharyngeal swab specimens and should not be used as a sole basis for treatment. Nasal washings and aspirates are unacceptable for Xpert Xpress SARS-CoV-2/FLU/RSV testing.  Fact  Sheet for Patients: BloggerCourse.com  Fact Sheet for Healthcare Providers: SeriousBroker.it  This test is not yet approved or cleared by the [place] FDA and has been authorized for detection and/or diagnosis of SARS-CoV-2 by FDA under an Emergency Use Authorization (EUA). This EUA will remain in effect (meaning this test can be used) for the duration of the COVID-19 declaration under Section 564(b)(1) of the Act, 21 U.S.C. section 360bbb-3(b)(1), unless the authorization is terminated or revoked.     Resp Syncytial Virus by PCR NEGATIVE NEGATIVE Final    Comment: (NOTE) Fact Sheet for Patients: BloggerCourse.com  Fact Sheet for Healthcare Providers: SeriousBroker.it  This test is not yet approved or cleared by the [place] FDA and has been authorized for detection and/or diagnosis of SARS-CoV-2 by FDA under an Emergency Use Authorization (EUA). This EUA will remain in effect (meaning this test can be used) for the duration of the COVID-19 declaration under Section 564(b)(1) of the Act, 21 U.S.C. section 360bbb-3(b)(1), unless the authorization is terminated or revoked.  Performed at [facility], [address] [place], [place] [address]   Gastrointestinal Panel by PCR , Stool     Status: None   Collection Time: 05/13/23  7:14 PM   Specimen: Stool  Result Value Ref Range Status   Campylobacter species NOT DETECTED NOT DETECTED Final   Plesimonas shigelloides NOT DETECTED NOT DETECTED Final   Salmonella species NOT DETECTED NOT DETECTED Final   Yersinia enterocolitica NOT DETECTED NOT DETECTED Final   Vibrio species NOT DETECTED NOT DETECTED Final   Vibrio cholerae NOT DETECTED NOT DETECTED Final   Enteroaggregative E coli (EAEC) NOT DETECTED NOT DETECTED Final   Enteropathogenic E coli (EPEC) NOT DETECTED NOT DETECTED Final   Enterotoxigenic E coli  (ETEC) NOT DETECTED NOT DETECTED Final   Shiga like toxin producing E coli (STEC) NOT DETECTED NOT DETECTED Final   Shigella/Enteroinvasive E coli (EIEC) NOT DETECTED NOT DETECTED Final   Cryptosporidium NOT DETECTED NOT DETECTED Final  Cyclospora cayetanensis NOT DETECTED NOT DETECTED Final   Entamoeba histolytica NOT DETECTED NOT DETECTED Final   Giardia lamblia NOT DETECTED NOT DETECTED Final   Adenovirus F40/41 NOT DETECTED NOT DETECTED Final   Astrovirus NOT DETECTED NOT DETECTED Final   Norovirus GI/GII NOT DETECTED NOT DETECTED Final   Rotavirus A NOT DETECTED NOT DETECTED Final   Sapovirus (I, II, IV, and V) NOT DETECTED NOT DETECTED Final    Comment: Performed at [facility], [address] [place], [place] [address]         Radiology Studies: No results found.       Scheduled Meds:  acidophilus  1 capsule Oral Daily   amLODipine  10 mg Oral QHS   carvedilol  25 mg Oral BID WC   cloNIDine  0.3 mg Oral BID   folic acid  1 mg Oral Daily   heparin  5,000 Units Subcutaneous Q8H   insulin aspart  0-6 Units Subcutaneous TID WC   insulin glargine-yfgn  10 Units Subcutaneous Daily   mycophenolate  1,000 mg Oral BID   predniSONE  5 mg Oral Q breakfast   sodium bicarbonate  1,300 mg Oral TID   tacrolimus  3 mg Oral q morning   And   tacrolimus  2 mg Oral QPM   Continuous Infusions:  magnesium sulfate bolus IVPB        LOS: 5 days    Time spent: 45 minutes spent on chart review, discussion with nursing staff, consultants, updating family and interview/physical exam; more than 50% of that time was spent in counseling and/or coordination of care.    Joseph Art, DO [facility] Available via Epic secure chat 7am-7pm After these hours, please refer to coverage provider listed on [email or web address] 05/17/2023, 11:46 AM

## 2023-05-17 NOTE — Progress Notes (Signed)
Patient ID: Barry Nichols, male   DOB: 11-01-1975, 48 y.o.   MRN: [id number] S: Feeling better this morning.  No new complaints.  Anxious to go home O:BP (!) 151/91 (BP Location: Right Arm)   Pulse 82   Temp 98.6 F (37 C) (Oral)   Resp 18   Ht 5\' 10"  (1.778 m)   Wt 107.7 kg   SpO2 97%   BMI 34.07 kg/m   Intake/Output Summary (Last 24 hours) at 05/17/2023 0908 Last data filed at 05/17/2023 0300 Gross per 24 hour  Intake 874.92 ml  Output 1250 ml  Net -375.08 ml   Intake/Output: I/O last 3 completed shifts: In: 1114.9 [P.O.:720; I.V.:22; Blood:372.9] Out: 2350 [Urine:2350]  Intake/Output this shift:  No intake/output data recorded. Weight change:  Gen: NAD CVS: RRR Resp:CTA Abd: +BS, soft, NT/ND Ext:no edema, LAVF +T/B  Recent Labs  Lab 05/12/23 2030 05/12/23 2037 05/13/23 0537 05/14/23 0605 05/15/23 0433 05/16/23 0426 05/17/23 0434  NA 128* 132* 134* 138 137 137 135  K 5.1 5.3* 4.4 3.3* 2.9* 3.0* 3.1*  CL 103  --  107 106 103 101 100  CO2 8*  --  <7* 14* 16* 19* 18*  GLUCOSE 166*  --  167* 160* 173* 296* 225*  BUN 171*  --  175* 166* 154* 139* 125*  CREATININE 17.35*  --  16.97* 14.84* 12.82* 10.88* 10.16*  ALBUMIN 3.1*  --  2.7* 2.6*  --   --   --   CALCIUM 8.9  --  8.6* 8.3* 7.9* 7.9* 8.2*  PHOS  --   --  10.4* 8.7*  --   --   --   AST 42*  --   --   --   --   --   --   ALT 59*  --   --   --   --   --   --    Liver Function Tests: Recent Labs  Lab 05/12/23 2030 05/13/23 0537 05/14/23 0605  AST 42*  --   --   ALT 59*  --   --   ALKPHOS 38  --   --   BILITOT 0.6  --   --   PROT 6.7  --   --   ALBUMIN 3.1* 2.7* 2.6*   No results for input(s): "LIPASE", "AMYLASE" in the last 168 hours. No results for input(s): "AMMONIA" in the last 168 hours. CBC: Recent Labs  Lab 05/12/23 2030 05/12/23 2037 05/13/23 0539 05/14/23 0605 05/15/23 0433 05/16/23 0426 05/16/23 1937 05/17/23 0434  WBC 7.9  --  6.7 6.1 5.4 6.1 6.5 7.0  NEUTROABS 6.4  --  5.4   --   --   --  4.7  --   HGB 8.2*   < > 7.7* 7.3* 7.4* 7.0* 7.3* 7.1*  HCT 25.0*   < > 24.6* 21.9* 21.7* 19.8* 22.5* 21.2*  MCV 85.6  --  89.5 83.3 82.8 81.5 84.0 84.1  PLT 307  --  283 272 292 270 257 250   < > = values in this interval not displayed.   Cardiac Enzymes: Recent Labs  Lab 05/13/23 0731  CKTOTAL 231   CBG: Recent Labs  Lab 05/16/23 0810 05/16/23 1208 05/16/23 1652 05/16/23 2203 05/17/23 0815  GLUCAP 182* 223* 229* 233* 170*    Iron Studies: No results for input(s): "IRON", "TIBC", "TRANSFERRIN", "FERRITIN" in the last 72 hours. Studies/Results: No results found.  acidophilus  1 capsule Oral Daily   amLODipine  10 mg Oral QHS   carvedilol  25 mg Oral BID WC   cloNIDine  0.3 mg Oral BID   folic acid  1 mg Oral Daily   heparin  5,000 Units Subcutaneous Q8H   insulin aspart  0-6 Units Subcutaneous TID WC   insulin glargine-yfgn  10 Units Subcutaneous Daily   mycophenolate  1,000 mg Oral BID   predniSONE  5 mg Oral Q breakfast   sodium bicarbonate  1,300 mg Oral TID   tacrolimus  3 mg Oral q morning   And   tacrolimus  2 mg Oral QPM    BMET    Component Value Date/Time   NA 135 05/17/2023 0434   K 3.1 (L) 05/17/2023 0434   CL 100 05/17/2023 0434   CO2 18 (L) 05/17/2023 0434   GLUCOSE 225 (H) 05/17/2023 0434   BUN 125 (H) 05/17/2023 0434   CREATININE 10.16 (H) 05/17/2023 0434   CALCIUM 8.2 (L) 05/17/2023 0434   GFRNONAA 6 (L) 05/17/2023 0434   GFRAA 7 (L) 08/27/2016 0357   CBC    Component Value Date/Time   WBC 7.0 05/17/2023 0434   RBC 2.52 (L) 05/17/2023 0434   HGB 7.1 (L) 05/17/2023 0434   HCT 21.2 (L) 05/17/2023 0434   PLT 250 05/17/2023 0434   MCV 84.1 05/17/2023 0434   MCH 28.2 05/17/2023 0434   MCHC 33.5 05/17/2023 0434   RDW 14.6 05/17/2023 0434   LYMPHSABS 0.8 05/16/2023 1937   MONOABS 0.4 05/16/2023 1937   EOSABS 0.5 05/16/2023 1937   BASOSABS 0.0 05/16/2023 1937     Assessment/Plan:  AKI/CKD stage III following DDKT- in  setting of volume depletion due to N/V/D and likely ATN.  Scr slowly improving.  No uremic symptoms.  Mild transplant hydronephrosis.  No uremic symptoms and feeling better.  Continue to follow for now.  Hopefully can be discharged home soon.  Hypokalemia - was high initially, now low.  Will check magnesium level.  Continue to replete and follow. S/p DDKT - continue with home meds.  TAC level stable at 3.5, no changes for now. N/V/D - improved.  GI panel negative. Anemia - hgb down to 7.  Transfuse per primary.     Irena Cords, MD [employer] [phone]

## 2023-05-17 NOTE — Progress Notes (Signed)
Patient's potassium and magnesium were replaced today. Dr. Benjamine Mola discontinued tele orders. Reached out to Dr. Benjamine Mola to see if we want to trend mag and potassium levels and tele status. She verified to leave off tele. No new orders received.

## 2023-05-18 DIAGNOSIS — E876 Hypokalemia: Secondary | ICD-10-CM

## 2023-05-18 DIAGNOSIS — E1129 Type 2 diabetes mellitus with other diabetic kidney complication: Secondary | ICD-10-CM | POA: Diagnosis not present

## 2023-05-18 DIAGNOSIS — Z794 Long term (current) use of insulin: Secondary | ICD-10-CM | POA: Diagnosis not present

## 2023-05-18 DIAGNOSIS — Z940 Kidney transplant status: Secondary | ICD-10-CM

## 2023-05-18 DIAGNOSIS — N179 Acute kidney failure, unspecified: Secondary | ICD-10-CM | POA: Diagnosis not present

## 2023-05-18 LAB — MAGNESIUM: Magnesium: 1.9 mg/dL (ref 1.7–2.4)

## 2023-05-18 LAB — BASIC METABOLIC PANEL
Anion gap: 16 — ABNORMAL HIGH (ref 5–15)
BUN: 119 mg/dL — ABNORMAL HIGH (ref 6–20)
CO2: 18 mmol/L — ABNORMAL LOW (ref 22–32)
Calcium: 9.6 mg/dL (ref 8.9–10.3)
Chloride: 105 mmol/L (ref 98–111)
Creatinine, Ser: 9.66 mg/dL — ABNORMAL HIGH (ref 0.61–1.24)
GFR, Estimated: 6 mL/min — ABNORMAL LOW (ref >60)
Glucose, Bld: 156 mg/dL — ABNORMAL HIGH (ref 70–99)
Potassium: 3.7 mmol/L (ref 3.5–5.1)
Sodium: 139 mmol/L (ref 135–145)

## 2023-05-18 LAB — CBC
HCT: 24.2 % — ABNORMAL LOW (ref 39.0–52.0)
Hemoglobin: 8 g/dL — ABNORMAL LOW (ref 13.0–17.0)
MCH: 28.3 pg (ref 26.0–34.0)
MCHC: 33.1 g/dL (ref 30.0–36.0)
MCV: 85.5 fL (ref 80.0–100.0)
Platelets: 252 10*3/uL (ref 150–400)
RBC: 2.83 MIL/uL — ABNORMAL LOW (ref 4.22–5.81)
RDW: 14.4 % (ref 11.5–15.5)
WBC: 7.7 10*3/uL (ref 4.0–10.5)
nRBC: 0 % (ref 0.0–0.2)

## 2023-05-18 LAB — GLUCOSE, CAPILLARY
Glucose-Capillary: 241 mg/dL — ABNORMAL HIGH (ref 70–99)
Glucose-Capillary: 228 mg/dL — ABNORMAL HIGH (ref 70–99)

## 2023-05-18 MED ORDER — SODIUM BICARBONATE 650 MG PO TABS: 1300.0000 mg | ORAL_TABLET | Freq: Three times a day (TID) | ORAL | 0 refills | Status: DC

## 2023-05-18 MED ORDER — POTASSIUM CHLORIDE CRYS ER 20 MEQ PO TBCR
20.0000 meq | EXTENDED_RELEASE_TABLET | Freq: Once | ORAL | Status: AC
  Administered 2023-05-18: 20 meq via ORAL
  Filled 2023-05-18: qty 1

## 2023-05-18 NOTE — Progress Notes (Signed)
Patient ID: Barry Nichols, male   DOB: 06-06-1975, 48 y.o.   MRN: [id number] S: Feeling better.  Wants to go home O:BP (!) 121/95 (BP Location: Right Arm)   Pulse 78   Temp 98.8 F (37.1 C) (Oral)   Resp 18   Ht 5\' 10"  (1.778 m)   Wt 108.1 kg   SpO2 97%   BMI 34.20 kg/m   Intake/Output Summary (Last 24 hours) at 05/18/2023 1116 Last data filed at 05/17/2023 2329 Gross per 24 hour  Intake 820 ml  Output 875 ml  Net -55 ml   Intake/Output: I/O last 3 completed shifts: In: 1060 [P.O.:960; IV Piggyback:100] Out: 2125 [Urine:2125]  Intake/Output this shift:  No intake/output data recorded. Weight change:  Gen: NAD CVS: RRR Resp:CTA Abd: +BS, soft, NT/ND Ext: no edema, LAVF +T/B  Recent Labs  Lab 05/12/23 2030 05/12/23 2037 05/13/23 0537 05/14/23 0605 05/15/23 0433 05/16/23 0426 05/17/23 0434 05/18/23 0516  NA 128* 132* 134* 138 137 137 135 139  K 5.1 5.3* 4.4 3.3* 2.9* 3.0* 3.1* 3.7  CL 103  --  107 106 103 101 100 105  CO2 8*  --  <7* 14* 16* 19* 18* 18*  GLUCOSE 166*  --  167* 160* 173* 296* 225* 156*  BUN 171*  --  175* 166* 154* 139* 125* 119*  CREATININE 17.35*  --  16.97* 14.84* 12.82* 10.88* 10.16* 9.66*  ALBUMIN 3.1*  --  2.7* 2.6*  --   --   --   --   CALCIUM 8.9  --  8.6* 8.3* 7.9* 7.9* 8.2* 9.6  PHOS  --   --  10.4* 8.7*  --   --   --   --   AST 42*  --   --   --   --   --   --   --   ALT 59*  --   --   --   --   --   --   --    Liver Function Tests: Recent Labs  Lab 05/12/23 2030 05/13/23 0537 05/14/23 0605  AST 42*  --   --   ALT 59*  --   --   ALKPHOS 38  --   --   BILITOT 0.6  --   --   PROT 6.7  --   --   ALBUMIN 3.1* 2.7* 2.6*   No results for input(s): "LIPASE", "AMYLASE" in the last 168 hours. No results for input(s): "AMMONIA" in the last 168 hours. CBC: Recent Labs  Lab 05/12/23 2030 05/12/23 2037 05/13/23 0539 05/14/23 0605 05/15/23 0433 05/16/23 0426 05/16/23 1937 05/17/23 0434 05/17/23 0813 05/18/23 0516  WBC 7.9   --  6.7   < > 5.4 6.1 6.5 7.0  --  7.7  NEUTROABS 6.4  --  5.4  --   --   --  4.7  --   --   --   HGB 8.2*   < > 7.7*   < > 7.4* 7.0* 7.3* 7.1* 7.4* 8.0*  HCT 25.0*   < > 24.6*   < > 21.7* 19.8* 22.5* 21.2* 21.5* 24.2*  MCV 85.6  --  89.5   < > 82.8 81.5 84.0 84.1  --  85.5  PLT 307  --  283   < > 292 270 257 250  --  252   < > = values in this interval not displayed.   Cardiac Enzymes: Recent Labs  Lab 05/13/23 [phone]  CKTOTAL 231   CBG: Recent Labs  Lab 05/17/23 0815 05/17/23 1150 05/17/23 1757 05/17/23 2114 05/18/23 0841  GLUCAP 170* 224* 271* 286* 241*    Iron Studies: No results for input(s): "IRON", "TIBC", "TRANSFERRIN", "FERRITIN" in the last 72 hours. Studies/Results: No results found.  acidophilus  1 capsule Oral Daily   amLODipine  10 mg Oral QHS   carvedilol  25 mg Oral BID WC   cloNIDine  0.3 mg Oral BID   folic acid  1 mg Oral Daily   heparin  5,000 Units Subcutaneous Q8H   insulin aspart  0-6 Units Subcutaneous TID WC   insulin glargine-yfgn  20 Units Subcutaneous Daily   mycophenolate  1,000 mg Oral BID   predniSONE  5 mg Oral Q breakfast   sodium bicarbonate  1,300 mg Oral TID   tacrolimus  3 mg Oral q morning   And   tacrolimus  2 mg Oral QPM    BMET    Component Value Date/Time   NA 139 05/18/2023 0516   K 3.7 05/18/2023 0516   CL 105 05/18/2023 0516   CO2 18 (L) 05/18/2023 0516   GLUCOSE 156 (H) 05/18/2023 0516   BUN 119 (H) 05/18/2023 0516   CREATININE 9.66 (H) 05/18/2023 0516   CALCIUM 9.6 05/18/2023 0516   GFRNONAA 6 (L) 05/18/2023 0516   GFRAA 7 (L) 08/27/2016 0357   CBC    Component Value Date/Time   WBC 7.7 05/18/2023 0516   RBC 2.83 (L) 05/18/2023 0516   HGB 8.0 (L) 05/18/2023 0516   HCT 24.2 (L) 05/18/2023 0516   PLT 252 05/18/2023 0516   MCV 85.5 05/18/2023 0516   MCH 28.3 05/18/2023 0516   MCHC 33.1 05/18/2023 0516   RDW 14.4 05/18/2023 0516   LYMPHSABS 0.8 05/16/2023 1937   MONOABS 0.4 05/16/2023 1937   EOSABS 0.5  05/16/2023 1937   BASOSABS 0.0 05/16/2023 1937      Assessment/Plan:   AKI/CKD stage III following DDKT- in setting of volume depletion due to N/V/D and likely ATN.  Scr slowly improving.  No uremic symptoms.  Mild transplant hydronephrosis.  No uremic symptoms and feeling better.  SCr continues to improve.  Ok for discharge from renal standpoint with close follow up after discharge. Hypokalemia - was high initially, now low.  Magnesium level low and replaced with improvement of both Mg and K.  Continue to replete and follow. S/p DDKT - continue with home meds.  TAC level stable at 3.5, no changes for now. N/V/D - improved.  GI panel negative. Anemia - hgb stable at 8.  Transfuse per primary. Disposition - pt is heading in the right direction.  Stable for discharge from renal standpoint and he will need to f/u with his transplant clinic after discharge and will arrange for outpatient follow up with Dr. Signe Colt (was his primary nephrologist before transplantation) in our office after discharge.        Irena Cords, MD [facility]

## 2023-05-18 NOTE — Progress Notes (Signed)
PROGRESS NOTE    Barry Nichols  OZH:[id number] DOB: Sep 15, 1975 DOA: 05/12/2023 PCP: Virgilio Belling, PA-C    Brief Narrative:  Barry Nichols is a 48 y.o. male with history of renal transplant in February 2022 being followed by nephrologist at [facility], diabetes mellitus type 2, hypertension was referred to the ER by primary care physician after labs showed acute worsening of patient's renal functions.  Patient states he has been feeling poorly for 3 weeks.  He has been having intermittent episodes of diarrhea vomiting and also had some symptoms concerning for upper respiratory infection.  Patient was placed on antibiotics (amoxicillin) by primary care physician about a week ago course of which patient completed and had presented again to the PCP yesterday and was placed on another course of antibiotics (doxycycline) and had blood drawn which showed acute renal failure.  Patient denies taking any NSAIDs and has been compliant with his antirejection medications including tacrolimus mycophenolate and prednisone and Bactrim.  Cr slowly getting better.  Plan per nephrology.     Assessment and Plan: Acute renal failure in renal transplanted kidney with metabolic acidosis -    -renal consulted-- no indication for dialysis and hopeful he will continue trending in the right direction. -daily labs-- CR trending down-- per renal  Anemia  -transfused x 1 unit-- hgb trending up -no sign of bleeding  Hypertension  -on clonidine, carvedilol, and amlodipine.  Diabetes mellitus type 2 takes [place] 20 units which patient has not taken the last 1 week due to poor appetite.   -SSI  Hypokalemia/hypomagnesemia -replete PO and IV -recheck in AM  Insomnia -added PRN meds   DVT prophylaxis: heparin injection 5,000 Units Start: 05/13/23 1400    Code Status: Full Code   Disposition Plan:  Level of care: Med-Surg Status is: Inpatient     Consultants:   renal   Subjective: Wants to go home  Objective: Vitals:   05/17/23 1948 05/17/23 2328 05/18/23 0343 05/18/23 0735  BP: (!) 161/91 (!) 151/91 134/77 (!) 121/95  Pulse:   78 78  Resp:  20 18 18   Temp: 98.7 F (37.1 C) 99 F (37.2 C) 98.8 F (37.1 C) 98.8 F (37.1 C)  TempSrc: Oral Oral Oral Oral  SpO2: 98% 98% 97% 97%  Weight:   108.1 kg   Height:        Intake/Output Summary (Last 24 hours) at 05/18/2023 1045 Last data filed at 05/17/2023 2329 Gross per 24 hour  Intake 820 ml  Output 875 ml  Net -55 ml   Filed Weights   05/15/23 0500 05/16/23 0500 05/18/23 0343  Weight: 105 kg 107.7 kg 108.1 kg    Examination:    General: Appearance:    Obese male in no acute distress     Lungs:     respirations unlabored  Heart:    Normal heart rate. Normal rhythm. No murmurs, rubs, or gallops.   MS:   All extremities are intact.   Neurologic:   Awake, alert      Data Reviewed: I have personally reviewed following labs and imaging studies  CBC: Recent Labs  Lab 05/12/23 2030 05/12/23 2037 05/13/23 0539 05/14/23 0605 05/15/23 0433 05/16/23 0426 05/16/23 1937 05/17/23 0434 05/17/23 0813 05/18/23 0516  WBC 7.9  --  6.7   < > 5.4 6.1 6.5 7.0  --  7.7  NEUTROABS 6.4  --  5.4  --   --   --  4.7  --   --   --  HGB 8.2*   < > 7.7*   < > 7.4* 7.0* 7.3* 7.1* 7.4* 8.0*  HCT 25.0*   < > 24.6*   < > 21.7* 19.8* 22.5* 21.2* 21.5* 24.2*  MCV 85.6  --  89.5   < > 82.8 81.5 84.0 84.1  --  85.5  PLT 307  --  283   < > 292 270 257 250  --  252   < > = values in this interval not displayed.   Basic Metabolic Panel: Recent Labs  Lab 05/13/23 0537 05/14/23 0605 05/15/23 0433 05/16/23 0426 05/17/23 0434 05/17/23 0900 05/18/23 0516  NA 134* 138 137 137 135  --  139  K 4.4 3.3* 2.9* 3.0* 3.1*  --  3.7  CL 107 106 103 101 100  --  105  CO2 <7* 14* 16* 19* 18*  --  18*  GLUCOSE 167* 160* 173* 296* 225*  --  156*  BUN 175* 166* 154* 139* 125*  --  119*  CREATININE 16.97*  14.84* 12.82* 10.88* 10.16*  --  9.66*  CALCIUM 8.6* 8.3* 7.9* 7.9* 8.2*  --  9.6  MG  --   --   --   --   --  1.1* 1.9  PHOS 10.4* 8.7*  --   --   --   --   --    GFR: Estimated Creatinine Clearance: 11.8 mL/min (A) (by C-G formula based on SCr of 9.66 mg/dL (H)). Liver Function Tests: Recent Labs  Lab 05/12/23 2030 05/13/23 0537 05/14/23 0605  AST 42*  --   --   ALT 59*  --   --   ALKPHOS 38  --   --   BILITOT 0.6  --   --   PROT 6.7  --   --   ALBUMIN 3.1* 2.7* 2.6*   No results for input(s): "LIPASE", "AMYLASE" in the last 168 hours. No results for input(s): "AMMONIA" in the last 168 hours. Coagulation Profile: No results for input(s): "INR", "PROTIME" in the last 168 hours. Cardiac Enzymes: Recent Labs  Lab 05/13/23 0731  CKTOTAL 231   BNP (last 3 results) No results for input(s): "PROBNP" in the last 8760 hours. HbA1C: No results for input(s): "HGBA1C" in the last 72 hours. CBG: Recent Labs  Lab 05/17/23 0815 05/17/23 1150 05/17/23 1757 05/17/23 2114 05/18/23 0841  GLUCAP 170* 224* 271* 286* 241*   Lipid Profile: No results for input(s): "CHOL", "HDL", "LDLCALC", "TRIG", "CHOLHDL", "LDLDIRECT" in the last 72 hours. Thyroid Function Tests: No results for input(s): "TSH", "T4TOTAL", "FREET4", "T3FREE", "THYROIDAB" in the last 72 hours. Anemia Panel: No results for input(s): "VITAMINB12", "FOLATE", "FERRITIN", "TIBC", "IRON", "RETICCTPCT" in the last 72 hours.  Sepsis Labs: Recent Labs  Lab 05/13/23 0734  LATICACIDVEN 0.5    Recent Results (from the past 240 hours)  Urine Culture     Status: Abnormal   Collection Time: 05/12/23  3:12 AM   Specimen: Urine, Random  Result Value Ref Range Status   Specimen Description URINE, RANDOM  Final   Special Requests NONE Reflexed from [id number]  Final   Culture (A)  Final    <10,000 COLONIES/mL INSIGNIFICANT GROWTH Performed at [facility], [address], [place], [place] [address]    Report Status  05/14/2023 FINAL  Final  Resp panel by RT-PCR (RSV, Flu A&B, Covid) Anterior Nasal Swab     Status: None   Collection Time: 05/12/23  8:08 PM   Specimen: Anterior Nasal  Swab  Result Value Ref Range Status   SARS Coronavirus 2 by RT PCR NEGATIVE NEGATIVE Final    Comment: (NOTE) SARS-CoV-2 target nucleic acids are NOT DETECTED.  The SARS-CoV-2 RNA is generally detectable in upper respiratory specimens during the acute phase of infection. The lowest concentration of SARS-CoV-2 viral copies this assay can detect is 138 copies/mL. A negative result does not preclude SARS-Cov-2 infection and should not be used as the sole basis for treatment or other patient management decisions. A negative result may occur with  improper specimen collection/handling, submission of specimen other than nasopharyngeal swab, presence of viral mutation(s) within the areas targeted by this assay, and inadequate number of viral copies(<138 copies/mL). A negative result must be combined with clinical observations, patient history, and epidemiological information. The expected result is Negative.  Fact Sheet for Patients:  BloggerCourse.com  Fact Sheet for Healthcare Providers:  SeriousBroker.it  This test is no t yet approved or cleared by the [place] FDA and  has been authorized for detection and/or diagnosis of SARS-CoV-2 by FDA under an Emergency Use Authorization (EUA). This EUA will remain  in effect (meaning this test can be used) for the duration of the COVID-19 declaration under Section 564(b)(1) of the Act, 21 U.S.C.section 360bbb-3(b)(1), unless the authorization is terminated  or revoked sooner.       Influenza A by PCR NEGATIVE NEGATIVE Final   Influenza B by PCR NEGATIVE NEGATIVE Final    Comment: (NOTE) The Xpert Xpress SARS-CoV-2/FLU/RSV plus assay is intended as an aid in the diagnosis of influenza from Nasopharyngeal swab specimens  and should not be used as a sole basis for treatment. Nasal washings and aspirates are unacceptable for Xpert Xpress SARS-CoV-2/FLU/RSV testing.  Fact Sheet for Patients: BloggerCourse.com  Fact Sheet for Healthcare Providers: SeriousBroker.it  This test is not yet approved or cleared by the [place] FDA and has been authorized for detection and/or diagnosis of SARS-CoV-2 by FDA under an Emergency Use Authorization (EUA). This EUA will remain in effect (meaning this test can be used) for the duration of the COVID-19 declaration under Section 564(b)(1) of the Act, 21 U.S.C. section 360bbb-3(b)(1), unless the authorization is terminated or revoked.     Resp Syncytial Virus by PCR NEGATIVE NEGATIVE Final    Comment: (NOTE) Fact Sheet for Patients: BloggerCourse.com  Fact Sheet for Healthcare Providers: SeriousBroker.it  This test is not yet approved or cleared by the [place] FDA and has been authorized for detection and/or diagnosis of SARS-CoV-2 by FDA under an Emergency Use Authorization (EUA). This EUA will remain in effect (meaning this test can be used) for the duration of the COVID-19 declaration under Section 564(b)(1) of the Act, 21 U.S.C. section 360bbb-3(b)(1), unless the authorization is terminated or revoked.  Performed at [facility], [address] [place], [place] [address]   Gastrointestinal Panel by PCR , Stool     Status: None   Collection Time: 05/13/23  7:14 PM   Specimen: Stool  Result Value Ref Range Status   Campylobacter species NOT DETECTED NOT DETECTED Final   Plesimonas shigelloides NOT DETECTED NOT DETECTED Final   Salmonella species NOT DETECTED NOT DETECTED Final   Yersinia enterocolitica NOT DETECTED NOT DETECTED Final   Vibrio species NOT DETECTED NOT DETECTED Final   Vibrio cholerae NOT DETECTED NOT DETECTED Final    Enteroaggregative E coli (EAEC) NOT DETECTED NOT DETECTED Final   Enteropathogenic E coli (EPEC) NOT DETECTED NOT DETECTED Final  Enterotoxigenic E coli (ETEC) NOT DETECTED NOT DETECTED Final   Shiga like toxin producing E coli (STEC) NOT DETECTED NOT DETECTED Final   Shigella/Enteroinvasive E coli (EIEC) NOT DETECTED NOT DETECTED Final   Cryptosporidium NOT DETECTED NOT DETECTED Final   Cyclospora cayetanensis NOT DETECTED NOT DETECTED Final   Entamoeba histolytica NOT DETECTED NOT DETECTED Final   Giardia lamblia NOT DETECTED NOT DETECTED Final   Adenovirus F40/41 NOT DETECTED NOT DETECTED Final   Astrovirus NOT DETECTED NOT DETECTED Final   Norovirus GI/GII NOT DETECTED NOT DETECTED Final   Rotavirus A NOT DETECTED NOT DETECTED Final   Sapovirus (I, II, IV, and V) NOT DETECTED NOT DETECTED Final    Comment: Performed at [facility], [address], [place], [place] [address]         Radiology Studies: No results found.       Scheduled Meds:  acidophilus  1 capsule Oral Daily   amLODipine  10 mg Oral QHS   carvedilol  25 mg Oral BID WC   cloNIDine  0.3 mg Oral BID   folic acid  1 mg Oral Daily   heparin  5,000 Units Subcutaneous Q8H   insulin aspart  0-6 Units Subcutaneous TID WC   insulin glargine-yfgn  20 Units Subcutaneous Daily   mycophenolate  1,000 mg Oral BID   predniSONE  5 mg Oral Q breakfast   sodium bicarbonate  1,300 mg Oral TID   tacrolimus  3 mg Oral q morning   And   tacrolimus  2 mg Oral QPM   Continuous Infusions:      LOS: 6 days    Time spent: 45 minutes spent on chart review, discussion with nursing staff, consultants, updating family and interview/physical exam; more than 50% of that time was spent in counseling and/or coordination of care.    Joseph Art, DO [facility] Available via Epic secure chat 7am-7pm After these hours, please refer to coverage provider listed on [email or web address] 05/18/2023, 10:45 AM

## 2023-05-18 NOTE — Discharge Summary (Signed)
Physician Discharge Summary  Barry Nichols ZOX:[id number] DOB: 06-27-1975 DOA: 05/12/2023  PCP: Virgilio Belling, PA-C  Admit date: 05/12/2023 Discharge date: 05/18/2023  Admitted From: home Discharge disposition: home   Recommendations for Outpatient Follow-Up:   BMP weekly until Cr normalized Close renal follow up   Discharge Diagnosis:   Principal Problem:   Acute renal failure (ARF) (HCC) Active Problems:   Anemia   DM (diabetes mellitus), type 2 with renal complications (HCC)   Essential hypertension   Renal transplant recipient   ARF (acute renal failure) (HCC)    Discharge Condition: Improved.  Diet recommendation: renal  Wound care: None.  Code status: Full.   History of Present Illness:   Barry Nichols is a 48 y.o. male with history of renal transplant in February 2022 being followed by nephrologist at [facility], diabetes mellitus type 2, hypertension was referred to the ER by primary care physician after labs showed acute worsening of patient's renal functions.  Patient states he has been feeling poorly since neither issue 3 weeks ago.  He has been having intermittent episodes of diarrhea vomiting and also had some symptoms concerning for upper respiratory infection.  Patient was placed on antibiotics (amoxicillin) by primary care physician about a week ago course of which patient completed and had presented again to the PCP yesterday and was placed on another course of antibiotics (doxycycline) and had blood drawn which showed acute renal failure.  Patient denies taking any NSAIDs and has been compliant with his antirejection medications including tacrolimus mycophenolate and prednisone and Bactrim.  He has not been eating well for last couple of weeks and has not taken his insulin for last 1 week.  Patient states he is still making urine.  Last creatinine in Care Everywhere in October 2024 was 1.9.  At that time hemoglobin was  12.3.   ED Course: In the ER labs show creatinine of 17.3 with bicarb of 8 hemoglobin of 8.2 platelets 307 with CT renal study showing mild hydronephrosis of the renal transplanted kidney and sodium was 128 VBG shows pH of 7.05.  ER physician discussed with nephrologist Dr. Glenna Fellows who advised patient to be started on bicarbonate infusion.  Patient was admitted to the hospitalist service after discussing with pulmonary critical care.   Hospital Course by Problem:   AKI/CKD stage III following DDKT- in setting of volume depletion due to N/V/D and likely ATN.  Scr slowly improving.  No uremic symptoms.  Mild transplant hydronephrosis.  No uremic symptoms and feeling better.  SCr continues to improve.   -outpatient renal follow up  Hypokalemia/hypomagnesemia -repleted  S/p DDKT - continue with home meds.  TAC level stable at 3.5, no changes for now.  N/V/D - improved.  GI panel negative.  Anemia - hgb stable at 8 -s/p transfusion  Hypertension  -on clonidine, carvedilol, and amlodipine.   Diabetes mellitus type 2 takes [place] 20 units which patient has not taken the last 1 week due to poor appetite.   -SSI    Medical Consultants:   renal   Discharge Exam:   Vitals:   05/18/23 0735 05/18/23 1121  BP: (!) 121/95 (!) 159/92  Pulse: 78   Resp: 18   Temp: 98.8 F (37.1 C)   SpO2: 97%    Vitals:   05/17/23 2328 05/18/23 0343 05/18/23 0735 05/18/23 1121  BP: (!) 151/91 134/77 (!) 121/95 (!) 159/92  Pulse:  78 78   Resp:  20 18 18    Temp: 99 F (37.2 C) 98.8 F (37.1 C) 98.8 F (37.1 C)   TempSrc: Oral Oral Oral   SpO2: 98% 97% 97%   Weight:  108.1 kg    Height:        General exam: Appears calm and comfortable.   The results of significant diagnostics from this hospitalization (including imaging, microbiology, ancillary and laboratory) are listed below for reference.     Procedures and Diagnostic Studies:   CT Renal Stone Study Result Date:  05/12/2023 CLINICAL DATA:  Shortness of breath. History of renal transplant. Concern for renal ischemia or infarct. EXAM: CT ABDOMEN AND PELVIS WITHOUT CONTRAST TECHNIQUE: Multidetector CT imaging of the abdomen and pelvis was performed following the standard protocol without IV contrast. RADIATION DOSE REDUCTION: This exam was performed according to the departmental dose-optimization program which includes automated exposure control, adjustment of the mA and/or kV according to patient size and/or use of iterative reconstruction technique. COMPARISON:  None Available. FINDINGS: Evaluation of this exam is limited in the absence of intravenous contrast. Lower chest: The visualized lung bases are clear. There is coronary vascular calcification. No intra-abdominal free air or free fluid. Hepatobiliary: The liver is unremarkable. No biliary dilatation. The gallbladder is unremarkable. Pancreas: Unremarkable. No pancreatic ductal dilatation or surrounding inflammatory changes. Spleen: Normal in size without focal abnormality. Adrenals/Urinary Tract: The adrenal glands unremarkable. Severe atrophy of the native kidneys. A 1.8 cm indeterminate exophytic lesion from the medial right kidney is not characterized on this CT and may represent a complex or proteinaceous cyst. Further evaluation with ultrasound on a nonemergent/outpatient basis recommended. No hydronephrosis of the native kidneys. There is a right lower quadrant renal transplant. There is mild hydronephrosis of the transplant kidney. The transplant kidney appears slightly edematous. There is stranding of the fat plane adjacent to the transplant kidney. No peritransplant fluid collection. No stone noted within the transplant kidney or ureter. The urinary bladder is unremarkable. Stomach/Bowel: Mild sigmoid diverticulosis. There is no bowel obstruction or active inflammation. The appendix is normal. Vascular/Lymphatic: Mild atherosclerotic calcification of the  abdominal aorta. The IVC is unremarkable. No portal venous gas. There is no adenopathy. Reproductive: The prostate and seminal vesicles are grossly unremarkable. No pelvic mass. Other: Small fat containing umbilical hernia. There is stranding of the periumbilical subcutaneous fat may be related to recent procedure. No fluid collection. Musculoskeletal: Avascular necrosis of the left femoral head with some cortical fragmentation. Evidence of prior left femoral neck screw. No acute osseous pathology. IMPRESSION: 1. Right lower quadrant renal transplant with mild hydronephrosis and edema of the transplant kidney. No peritransplant fluid collection. Correlation with urinalysis and further evaluation with renal transplant ultrasound and interrogation of the transplant vasculature and internal resistive indices recommended. 2. Mild sigmoid diverticulosis. No bowel obstruction. Normal appendix. 3. Avascular necrosis of the left femoral head. 4.  Aortic Atherosclerosis (ICD10-I70.0). Electronically Signed   By: Elgie Collard M.D.   On: 05/12/2023 21:02     Labs:   Basic Metabolic Panel: Recent Labs  Lab 05/13/23 0537 05/14/23 0605 05/15/23 0433 05/16/23 0426 05/17/23 0434 05/17/23 0900 05/18/23 0516  NA 134* 138 137 137 135  --  139  K 4.4 3.3* 2.9* 3.0* 3.1*  --  3.7  CL 107 106 103 101 100  --  105  CO2 <7* 14* 16* 19* 18*  --  18*  GLUCOSE 167* 160* 173* 296* 225*  --  156*  BUN 175* 166* 154* 139* 125*  --  119*  CREATININE 16.97* 14.84* 12.82* 10.88* 10.16*  --  9.66*  CALCIUM 8.6* 8.3* 7.9* 7.9* 8.2*  --  9.6  MG  --   --   --   --   --  1.1* 1.9  PHOS 10.4* 8.7*  --   --   --   --   --    GFR Estimated Creatinine Clearance: 11.8 mL/min (A) (by C-G formula based on SCr of 9.66 mg/dL (H)). Liver Function Tests: Recent Labs  Lab 05/12/23 2030 05/13/23 0537 05/14/23 0605  AST 42*  --   --   ALT 59*  --   --   ALKPHOS 38  --   --   BILITOT 0.6  --   --   PROT 6.7  --   --    ALBUMIN 3.1* 2.7* 2.6*   No results for input(s): "LIPASE", "AMYLASE" in the last 168 hours. No results for input(s): "AMMONIA" in the last 168 hours. Coagulation profile No results for input(s): "INR", "PROTIME" in the last 168 hours.  CBC: Recent Labs  Lab 05/12/23 2030 05/12/23 2037 05/13/23 0539 05/14/23 0605 05/15/23 0433 05/16/23 0426 05/16/23 1937 05/17/23 0434 05/17/23 0813 05/18/23 0516  WBC 7.9  --  6.7   < > 5.4 6.1 6.5 7.0  --  7.7  NEUTROABS 6.4  --  5.4  --   --   --  4.7  --   --   --   HGB 8.2*   < > 7.7*   < > 7.4* 7.0* 7.3* 7.1* 7.4* 8.0*  HCT 25.0*   < > 24.6*   < > 21.7* 19.8* 22.5* 21.2* 21.5* 24.2*  MCV 85.6  --  89.5   < > 82.8 81.5 84.0 84.1  --  85.5  PLT 307  --  283   < > 292 270 257 250  --  252   < > = values in this interval not displayed.   Cardiac Enzymes: Recent Labs  Lab 05/13/23 0731  CKTOTAL 231   BNP: Invalid input(s): "POCBNP" CBG: Recent Labs  Lab 05/17/23 1150 05/17/23 1757 05/17/23 2114 05/18/23 0841 05/18/23 1120  GLUCAP 224* 271* 286* 241* 228*   D-Dimer No results for input(s): "DDIMER" in the last 72 hours. Hgb A1c No results for input(s): "HGBA1C" in the last 72 hours. Lipid Profile No results for input(s): "CHOL", "HDL", "LDLCALC", "TRIG", "CHOLHDL", "LDLDIRECT" in the last 72 hours. Thyroid function studies No results for input(s): "TSH", "T4TOTAL", "T3FREE", "THYROIDAB" in the last 72 hours.  Invalid input(s): "FREET3" Anemia work up No results for input(s): "VITAMINB12", "FOLATE", "FERRITIN", "TIBC", "IRON", "RETICCTPCT" in the last 72 hours. Microbiology Recent Results (from the past 240 hours)  Urine Culture     Status: Abnormal   Collection Time: 05/12/23  3:12 AM   Specimen: Urine, Random  Result Value Ref Range Status   Specimen Description URINE, RANDOM  Final   Special Requests NONE Reflexed from [id number]  Final   Culture (A)  Final    <10,000 COLONIES/mL INSIGNIFICANT GROWTH Performed at  [facility], [address] [place], [place] [address]    Report Status 05/14/2023 FINAL  Final  Resp panel by RT-PCR (RSV, Flu A&B, Covid) Anterior Nasal Swab     Status: None   Collection Time: 05/12/23  8:08 PM   Specimen: Anterior Nasal Swab  Result Value Ref Range Status   SARS Coronavirus 2 by RT PCR NEGATIVE NEGATIVE Final    Comment: (  NOTE) SARS-CoV-2 target nucleic acids are NOT DETECTED.  The SARS-CoV-2 RNA is generally detectable in upper respiratory specimens during the acute phase of infection. The lowest concentration of SARS-CoV-2 viral copies this assay can detect is 138 copies/mL. A negative result does not preclude SARS-Cov-2 infection and should not be used as the sole basis for treatment or other patient management decisions. A negative result may occur with  improper specimen collection/handling, submission of specimen other than nasopharyngeal swab, presence of viral mutation(s) within the areas targeted by this assay, and inadequate number of viral copies(<138 copies/mL). A negative result must be combined with clinical observations, patient history, and epidemiological information. The expected result is Negative.  Fact Sheet for Patients:  BloggerCourse.com  Fact Sheet for Healthcare Providers:  SeriousBroker.it  This test is no t yet approved or cleared by the [place] FDA and  has been authorized for detection and/or diagnosis of SARS-CoV-2 by FDA under an Emergency Use Authorization (EUA). This EUA will remain  in effect (meaning this test can be used) for the duration of the COVID-19 declaration under Section 564(b)(1) of the Act, 21 U.S.C.section 360bbb-3(b)(1), unless the authorization is terminated  or revoked sooner.       Influenza A by PCR NEGATIVE NEGATIVE Final   Influenza B by PCR NEGATIVE NEGATIVE Final    Comment: (NOTE) The Xpert Xpress SARS-CoV-2/FLU/RSV plus assay is  intended as an aid in the diagnosis of influenza from Nasopharyngeal swab specimens and should not be used as a sole basis for treatment. Nasal washings and aspirates are unacceptable for Xpert Xpress SARS-CoV-2/FLU/RSV testing.  Fact Sheet for Patients: BloggerCourse.com  Fact Sheet for Healthcare Providers: SeriousBroker.it  This test is not yet approved or cleared by the [place] FDA and has been authorized for detection and/or diagnosis of SARS-CoV-2 by FDA under an Emergency Use Authorization (EUA). This EUA will remain in effect (meaning this test can be used) for the duration of the COVID-19 declaration under Section 564(b)(1) of the Act, 21 U.S.C. section 360bbb-3(b)(1), unless the authorization is terminated or revoked.     Resp Syncytial Virus by PCR NEGATIVE NEGATIVE Final    Comment: (NOTE) Fact Sheet for Patients: BloggerCourse.com  Fact Sheet for Healthcare Providers: SeriousBroker.it  This test is not yet approved or cleared by the [place] FDA and has been authorized for detection and/or diagnosis of SARS-CoV-2 by FDA under an Emergency Use Authorization (EUA). This EUA will remain in effect (meaning this test can be used) for the duration of the COVID-19 declaration under Section 564(b)(1) of the Act, 21 U.S.C. section 360bbb-3(b)(1), unless the authorization is terminated or revoked.  Performed at [facility], [address] [place], [place] [address]   Gastrointestinal Panel by PCR , Stool     Status: None   Collection Time: 05/13/23  7:14 PM   Specimen: Stool  Result Value Ref Range Status   Campylobacter species NOT DETECTED NOT DETECTED Final   Plesimonas shigelloides NOT DETECTED NOT DETECTED Final   Salmonella species NOT DETECTED NOT DETECTED Final   Yersinia enterocolitica NOT DETECTED NOT DETECTED Final   Vibrio species NOT  DETECTED NOT DETECTED Final   Vibrio cholerae NOT DETECTED NOT DETECTED Final   Enteroaggregative E coli (EAEC) NOT DETECTED NOT DETECTED Final   Enteropathogenic E coli (EPEC) NOT DETECTED NOT DETECTED Final   Enterotoxigenic E coli (ETEC) NOT DETECTED NOT DETECTED Final   Shiga like toxin producing E coli (STEC) NOT DETECTED NOT DETECTED  Final   Shigella/Enteroinvasive E coli (EIEC) NOT DETECTED NOT DETECTED Final   Cryptosporidium NOT DETECTED NOT DETECTED Final   Cyclospora cayetanensis NOT DETECTED NOT DETECTED Final   Entamoeba histolytica NOT DETECTED NOT DETECTED Final   Giardia lamblia NOT DETECTED NOT DETECTED Final   Adenovirus F40/41 NOT DETECTED NOT DETECTED Final   Astrovirus NOT DETECTED NOT DETECTED Final   Norovirus GI/GII NOT DETECTED NOT DETECTED Final   Rotavirus A NOT DETECTED NOT DETECTED Final   Sapovirus (I, II, IV, and V) NOT DETECTED NOT DETECTED Final    Comment: Performed at [facility], [address] [place], [place] [address]     Discharge Instructions:   Discharge Instructions     Diet Carb Modified   Complete by: As directed    Discharge instructions   Complete by: As directed    Monitor blood sugars closely, may need less insulin while your Cr is high BMP 1 week f/u with his transplant clinic after discharge   Increase activity slowly   Complete by: As directed       Allergies as of 05/18/2023   No Known Allergies      Medication List     PAUSE taking these medications    Mounjaro 15 MG/0.5ML Pen Wait to take this until your doctor or other care provider tells you to start again. Generic drug: tirzepatide Inject 15 mg into the skin once a week.       STOP taking these medications    amoxicillin 500 MG capsule Commonly known as: AMOXIL   aspirin EC 325 MG tablet   doxycycline 100 MG capsule Commonly known as: VIBRAMYCIN       TAKE these medications    amLODipine 10 MG tablet Commonly known as:  NORVASC Take 1 tablet (10 mg total) by mouth at bedtime.   carvedilol 25 MG tablet Commonly known as: COREG Take 1 tablet (25 mg total) by mouth 2 (two) times daily with a meal.   cinacalcet 30 MG tablet Commonly known as: SENSIPAR Take 30 mg by mouth daily.   cloNIDine 0.3 MG tablet Commonly known as: CATAPRES Take 1 tablet (0.3 mg total) by mouth 3 (three) times daily. What changed: when to take this   fenofibrate 145 MG tablet Commonly known as: TRICOR Take 145 mg by mouth daily.   HumaLOG KwikPen 200 UNIT/ML KwikPen Generic drug: insulin lispro Inject 25-40 Units into the skin 3 (three) times daily as needed (high blood sugar).   icosapent Ethyl 1 g capsule Commonly known as: VASCEPA Take 2 g by mouth 2 (two) times daily.   insulin degludec 200 UNIT/ML FlexTouch Pen Commonly known as: TRESIBA Inject 30 Units into the skin at bedtime.   mycophenolate 500 MG tablet Commonly known as: CELLCEPT Take 1,000 mg by mouth 2 (two) times daily.   oxyCODONE 5 MG immediate release tablet Commonly known as: Roxicodone Take 1 tablet (5 mg total) by mouth every 4 (four) hours as needed for severe pain or breakthrough pain.   predniSONE 5 MG tablet Commonly known as: DELTASONE Take 5 mg by mouth daily.   sodium bicarbonate 650 MG tablet Take 2 tablets (1,300 mg total) by mouth 3 (three) times daily.   sulfamethoxazole-trimethoprim 400-80 MG tablet Commonly known as: BACTRIM Take 1 tablet by mouth every Monday, Wednesday, and Friday.   tacrolimus 1 MG capsule Commonly known as: PROGRAF Take 2-3 mg by mouth See admin instructions. Take 3 capsules (3mg ) in the morning and then  take 2 capsules (2mg ) at bedtime.   tadalafil 20 MG tablet Commonly known as: CIALIS Take 20 mg by mouth daily as needed for erectile dysfunction.   traZODone 50 MG tablet Commonly known as: DESYREL Take 50 mg by mouth at bedtime.   Vitamin D (Ergocalciferol) 1.25 MG (50000 UNIT) Caps  capsule Commonly known as: DRISDOL Take 50,000 Units by mouth every 7 (seven) days.          Time coordinating discharge: 35 min  Signed:  Joseph Art DO  [facility] 05/18/2023, 11:46 AM

## 2023-05-18 NOTE — Care Management Important Message (Signed)
Important Message  Patient Details  Name: Barry Nichols MRN: [id number] Date of Birth: 1975/11/24   Important Message Given:  Yes - Medicare IM     Dorena Bodo 05/18/2023, 4:21 PM

## 2023-05-18 NOTE — Progress Notes (Signed)
Pt discharge education and instructions completed with pt and spouse at bedside. Both voices understanding and denies any questions. Pt IV and telemetry removed. Pt discharged to the discharge lounge and wife will come pick him up later. Pt to pick up electronically sent prescriptions from preferred pharmacy on file. Pt transported off unit via wheelchair with belongings to the to the discharge lounge. Dionne Bucy RN

## 2023-05-18 NOTE — Care Management Important Message (Signed)
Important Message  Patient Details  Name: Barry Nichols MRN: [id number] Date of Birth: 03-30-1976   Important Message Given:  Yes - Medicare IM Patient left prior to IM delivery will mail a copy to the patient home address    Dorena Bodo 05/18/2023, 4:49 PM

## 2023-05-31 NOTE — Discharge Summary (Signed)
 Nephrology Inpatient Discharge Note     Discharge Diagnosis   Admission Date:   05/26/2023  8:02 PM                      Attending: Emaline Gerri Amend, MD   Discharge Date:   05/31/2023                 Consultants: Treatment Team:  Consulting Physician: Emaline Gerri Amend, MD    DISCHARGE DIAGNOSIS: Principal Problem:   Acute rejection of kidney transplant Resolved Problems:   * No resolved hospital problems. *  1. 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 Immune suppression 4. 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  He has a functioning left forearm AV fistula. 2.  Deceased donor transplant 07/04/2020.  His course was complicated by acute antibody mediated rejection.  Biopsy 06/20/2020 showed diffuse acute tubular injury, mild interstitial fibrosis, no cellular rejection, 50% C4D staining in peritubular capillaries (PTC 3).  DSA was negative for donor specific antibodies.  He was treated with plasmapheresis x5, 500 mg/kg of IVIG post each plasmapheresis treatment and received 1 gram of rituximab on 06/28/2020.  He has been maintained on full-dose mycophenolate , prednisone  5 mg daily, and tacrolimus  with target troughs 6-8. 2.  Diabetes mellitus, 3.  Hepatic steatosis. 4.  Hyperparathyroidism. 5.  Bosniak 2 renal cyst.  PT presented in Jan 2025 @ outside facility with nausea, vomiting and not feeling well. Creatinine was 17. Was started on IVF and creatinine came back @ 9.9. He was seen last week @ [facility] and subsequently was admitted to hospital. DSA multiple positive with MFI in several thousands. Final report pending Biopsy showed severe TCMR, ABMR, intraparenchymal hemorrhage. MMDX pending.  I dw my colleagues too and it was decided that at this time, risks of treatment including exposure to infection etc outweigh benefits in this case. Hence to avoid allograft  intolerance, hhe was treated with  solumedrol x 3 doses of 500 mg and then discharged on   Pred 40  MMF 1000/1000 Tac   We will have him start HD @ out pt clinic :  Fu in one month to adjust his IS  He was interested in stem cell research. I dw him to wait for 4-5 months, before travelling to [place] for the same.  Pending his seat acceptance : will follow.   Medication changes at the time of discharge   See above.  Follow up Instructions   1 month follow up,  Examination   General: awake oriented x 3  Lungs: Clear anteriorly and laterally Cardiovascular: S1 S2 normal, No murmur rub or gallop,  Abdomen: Soft, nontender Extremities: pedal edema  Neurological : no focal deficits noted  Patient Vitals for the past 8 hrs:  BP Temp Temp src Pulse Resp SpO2  05/31/23 0438 (!) 159/100 98.6 F (37 C) Oral 89 16 100 %  05/31/23 0031 151/79 99 F (37.2 C) Oral 85 17 100 %    No results displayed because visit has over 200 results.       Lab Results  Component Value Date   CREATININE 7.48 (H) 05/30/2023   BUN 103 (H) 05/30/2023   NA 139 05/30/2023   K 3.6 05/30/2023   CL 99 05/30/2023   CO2 22 05/30/2023    Lab Results  Component Value Date   WBC 7.87 05/30/2023  HGB 7.8 (L) 05/30/2023   HCT 24 (L) 05/30/2023   MCV 86 05/30/2023   PLT 349 05/30/2023    I/O last 3 completed shifts: In: 680 [P.O.:680] Out: 2970 [Urine:2970] I/O this shift: In: 0  Out: 725 [Urine:725]   @currentmeds @

## 2023-09-23 NOTE — Progress Notes (Signed)
 ARCHIVE ORDER ENTERED FOR OUTSIDE IMAGES CT ABDOMEN PELVIS W/O 01.22.25   PUSHED FROM  09/23/2023

## 2023-09-24 NOTE — Progress Notes (Signed)
 Archived/Link imaging routed to surgeon for review.  Electronically signed by: Rosina LOISE Dasen, RN 09/24/2023 8:25 AM

## 2023-10-18 NOTE — H&P (Signed)
 Gastroenterology Preprocedural History and Physical     Chief Complaint/Reason for Procedure: Barry Nichols. is a 49 y.o. male scheduled for a Colonoscopy, for the following indication:  Colorectal cancer screening and Chronic diarrhea multiple times per week in the setting of immunosuppression for previous renal transplant  We will be using deep sedation with propofol  or general anesthesia as per anesthesia provider.  A History and Physical has been performed and patient medication allergies have been reviewed. The patient's tolerance of previous anesthesia has been reviewed. The risks and benefits of the procedure and the sedation options and risks were discussed with the patient. All questions were answered and informed consent obtained.  HPI  Medical History[1]  Surgical History[2]  Family History[3]  Social History   Socioeconomic History  . Marital status: Married    Spouse name: Not on file  . Number of children: Not on file  . Years of education: Not on file  . Highest education level: Not on file  Occupational History  . Not on file  Tobacco Use  . Smoking status: Never  . Smokeless tobacco: Never  Vaping Use  . Vaping status: Former  Substance and Sexual Activity  . Alcohol use: No  . Drug use: Never  . Sexual activity: Not on file  Other Topics Concern  . Not on file  Social History Narrative  . Not on file   Social Drivers of Health   Food Insecurity: Low Risk  (05/27/2023)   Food vital sign   . Within the past 12 months, you worried that your food would run out before you got money to buy more: Never true   . Within the past 12 months, the food you bought just didn't last and you didn't have money to get more: Never true  Transportation Needs: No Transportation Needs (05/27/2023)   Transportation   . In the past 12 months, has lack of reliable transportation kept you from medical appointments, meetings, work or from getting things needed  for daily living? : No  Safety: Low Risk  (05/27/2023)   Safety   . How often does anyone, including family and friends, physically hurt you?: Never   . How often does anyone, including family and friends, insult or talk down to you?: Never   . How often does anyone, including family and friends, threaten you with harm?: Never   . How often does anyone, including family and friends, scream or curse at you?: Never  Living Situation: Low Risk  (05/27/2023)   Living Situation   . What is your living situation today?: I have a steady place to live   . Think about the place you live. Do you have problems with any of the following? Choose all that apply:: None/None on this list    Current Rx ordered in [facility][4]  Allergies[5]    Physical Exam:  There were no vitals filed for this visit. There is no height or weight on file to calculate BMI.  Airway:  MALLAMPATI TWO   Heart:  normal S1 and S2 Lungs:  clear Abdomen:  soft, nontender, normal bowel sounds Mental Status:  awake and alert; oriented to person, place, and time     ASA Grade Assessment: ASA 3 - Patient with moderate systemic disease with functional limitations   I have reviewed patient's health history and patient is cleared to proceed with the proposed procedure at this facility.   Belvie Toribio Seip, MD       [1]  Past Medical History: Diagnosis Date  . Avascular bone necrosis    (CMD)   [2] Past Surgical History: Procedure Laterality Date  . HIP SURGERY  12/07/2022  [3] Family History Problem Relation Name Age of Onset  . Diabetes Father    [4] Meds Ordered in [facility]  Medication Sig Dispense Refill  . amLODIPine  (NORVASC ) 5 mg tablet Take 10 mg by mouth Once Daily.     . BD Nano 2nd Gen Pen Needle 32 gauge x 5/32 ndle USE ONE PEN NEEDLE DAILY 90 each 3  . blood-glucose sensor (FreeStyle Libre 3 Sensor) 1 each by miscellaneous route every 14 (fourteen) days. 6 each 1  . carvediloL  (COREG ) 25 mg tablet  Take 25 mg by mouth 2 (two) times a day with meals.    . cinacalcet (SENSIPAR) 30 mg tablet Take 30 mg by mouth Once Daily. Take with food or shortly afer a meal. Swallow tablet whole; do not break or divide.    . cloNIDine  (CATAPRES ) 0.3 mg tablet Take 1 tablet by mouth 2 (two) times a day.    . ergocalciferol (VITAMIN D2) 1,250 mcg (50,000 unit) capsule Take 50,000 Units by mouth once a week. Weekly x4 months then monthly thereafter    . fenofibrate nanocrystallized (TRICOR) 145 mg tablet Take 1 tablet (145 mg total) by mouth daily. 90 tablet 2  . icosapent ethyL 1 gram cap Take 2 g by mouth in the morning and 2 g in the evening. Take with meals. 360 capsule 2  . insulin  lispro (HumaLOG KwikPen) 100 unit/mL KwikPen Give insulin  based on sliding scale three times daily either 15 minutes before or after meals. (125-150=2units ,151-200=4units, 201-250=6Units, 251-300=8units, 301-350=10Units, 351-400=12units >500call office. 40 mL 0  . insulin  syringe,safety needle (BD SafetyGlide Insulin  Syringe) 0.5 mL 30 gauge x 5/16 syrg Use as directed 50 each 2  . mycophenolate  (CELLCEPT ) 500 mg tablet Take 1.5 tablets (750 mg total) by mouth 2 (two) times a day. 270 tablet 0  . predniSONE  (DELTASONE ) 5 mg tablet Take 5 mg by mouth daily.    . tacrolimus  (PROGRAF ) 1 mg capsule Take 4 capsules (4 mg total) by mouth in the morning and 4 capsules (4 mg total) in the evening. 720 capsule 0  . tadalafiL (CIALIS) 20 mg tablet Take 1 tablet (20 mg total) by mouth daily as needed for erectile dysfunction. 90 tablet 3  . traZODone (DESYREL) 50 mg tablet Take 50 mg by mouth at bedtime.    . Tresiba FlexTouch U-100 100 unit/mL (3 mL) pen Inject 34 Units under the skin daily. 45 mL 1   No current Epic-ordered facility-administered medications on file.  [5] Allergies Allergen Reactions  . Adhesive Rash    Patient states tape pulls his skin off.

## 2023-10-19 NOTE — Anesthesia Preprocedure Evaluation (Signed)
 Patient: Barry Alexander Duhart Jr.  Procedure Information     Date/Time: 10/19/23 1200   Scheduled providers: Belvie Toribio Seip, MD; Alfonso Lorane Part, CRNA   Procedure: COLONOSCOPY   Location: [facility] - ENDOSCOPY       Relevant Problems  CARDIOVASCULAR  (+) AVF (arteriovenous fistula)  (+) Hypertension    GU  (+) Acute rejection of kidney transplant (CMD)  (+) ESRD (end stage renal disease)    (CMD)    Ht 1.778 m (5' 10)   Wt 102 kg (225 lb)   BMI 32.28 kg/m    Clinical information reviewed:  Tobacco  Allergies  Meds  Med Hx  Surg Hx      Anesthesia Evaluation  PONV Predictive Score (Scale 0-5):  Apfel risk score: 0 Cardiovascular: Patient has high blood pressure.  Neurological: Patient has neuromuscular disease.  Genitourinary: Patient has end-stage renal disease.  Endocrine: Patient has type 2 diabetes.     Physical Exam  Airway  Mallampati: III TM Distance (FB): 3 Oral Aperture (FB): 3 Cardiovascular - normal exam Dental - normal exam Pulmonary - normal exam   Anesthesia Plan  Review Comments: 75 yr M s/p failed renal transplant now on dialysis.   Plan ASA score: 3  Anesthesia type: general Induction: intravenous Post-op plan: PACU  Informed Consent Anesthetic plan and risks discussed with patient. Plan discussed with CRNA.   Date of Last Liquid: 10/19/23 Time of last liquid: 0500 (prep completed) Date of Last Solid: 10/17/23

## 2023-10-19 NOTE — Telephone Encounter (Signed)
-----   Message from Damien Graham, MD sent at 10/19/2023  9:38 AM EDT ----- Transplantable on left; vasculature also acceptable for pancreas should we decide he's a candidate.  Not sure why hydro of previous kidney, nor why such an abrupt rejection if truly adherent. May require urology eval for obstructive issues. Ok to bring in for eval, though. ----- Message ----- From: Rosina Nat Dasen, RN Sent: 09/24/2023   8:18 AM EDT To: Damien Lamarr Almarie Graham, MD  New Eval, Completed EDU. Outside imaging archived for review. - AT

## 2023-11-04 ENCOUNTER — Emergency Department (HOSPITAL_BASED_OUTPATIENT_CLINIC_OR_DEPARTMENT_OTHER)
Admission: EM | Admit: 2023-11-04 | Discharge: 2023-11-05 | Disposition: A | Discharge status: Home / Self Care | Attending: Emergency Medicine | Admitting: Emergency Medicine

## 2023-11-04 ENCOUNTER — Encounter (HOSPITAL_BASED_OUTPATIENT_CLINIC_OR_DEPARTMENT_OTHER): Payer: Self-pay | Admitting: Emergency Medicine

## 2023-11-04 ENCOUNTER — Other Ambulatory Visit: Payer: Self-pay

## 2023-11-04 ENCOUNTER — Emergency Department (HOSPITAL_BASED_OUTPATIENT_CLINIC_OR_DEPARTMENT_OTHER)

## 2023-11-04 DIAGNOSIS — Z794 Long term (current) use of insulin: Secondary | ICD-10-CM | POA: Insufficient documentation

## 2023-11-04 DIAGNOSIS — Z79899 Other long term (current) drug therapy: Secondary | ICD-10-CM | POA: Diagnosis not present

## 2023-11-04 DIAGNOSIS — E1122 Type 2 diabetes mellitus with diabetic chronic kidney disease: Secondary | ICD-10-CM | POA: Diagnosis not present

## 2023-11-04 DIAGNOSIS — I132 Hypertensive heart and chronic kidney disease with heart failure and with stage 5 chronic kidney disease, or end stage renal disease: Secondary | ICD-10-CM | POA: Insufficient documentation

## 2023-11-04 DIAGNOSIS — N186 End stage renal disease: Secondary | ICD-10-CM | POA: Insufficient documentation

## 2023-11-04 DIAGNOSIS — K859 Acute pancreatitis without necrosis or infection, unspecified: Secondary | ICD-10-CM | POA: Insufficient documentation

## 2023-11-04 DIAGNOSIS — Z992 Dependence on renal dialysis: Secondary | ICD-10-CM | POA: Diagnosis not present

## 2023-11-04 DIAGNOSIS — R109 Unspecified abdominal pain: Secondary | ICD-10-CM | POA: Diagnosis present

## 2023-11-04 DIAGNOSIS — E860 Dehydration: Secondary | ICD-10-CM

## 2023-11-04 DIAGNOSIS — I509 Heart failure, unspecified: Secondary | ICD-10-CM | POA: Insufficient documentation

## 2023-11-04 LAB — CBC
HCT: 28.3 % — ABNORMAL LOW (ref 39.0–52.0)
Hemoglobin: 9.3 g/dL — ABNORMAL LOW (ref 13.0–17.0)
MCH: 28.5 pg (ref 26.0–34.0)
MCHC: 32.9 g/dL (ref 30.0–36.0)
MCV: 86.8 fL (ref 80.0–100.0)
Platelets: 322 K/uL (ref 150–400)
RBC: 3.26 MIL/uL — ABNORMAL LOW (ref 4.22–5.81)
RDW: 15.3 % (ref 11.5–15.5)
WBC: 9.3 K/uL (ref 4.0–10.5)
nRBC: 0 % (ref 0.0–0.2)

## 2023-11-04 LAB — COMPREHENSIVE METABOLIC PANEL WITH GFR
ALT: 77 U/L — ABNORMAL HIGH (ref 0–44)
AST: 49 U/L — ABNORMAL HIGH (ref 15–41)
Albumin: 3.7 g/dL (ref 3.5–5.0)
Alkaline Phosphatase: 111 U/L (ref 38–126)
Anion gap: 21 — ABNORMAL HIGH (ref 5–15)
BUN: 50 mg/dL — ABNORMAL HIGH (ref 6–20)
CO2: 24 mmol/L (ref 22–32)
Calcium: 10 mg/dL (ref 8.9–10.3)
Chloride: 87 mmol/L — ABNORMAL LOW (ref 98–111)
Creatinine, Ser: 12.2 mg/dL — ABNORMAL HIGH (ref 0.61–1.24)
GFR, Estimated: 5 mL/min — ABNORMAL LOW (ref >60)
Glucose, Bld: 202 mg/dL — ABNORMAL HIGH (ref 70–99)
Potassium: 4.5 mmol/L (ref 3.5–5.1)
Sodium: 132 mmol/L — ABNORMAL LOW (ref 135–145)
Total Bilirubin: 0.6 mg/dL (ref 0.0–1.2)
Total Protein: 8.3 g/dL — ABNORMAL HIGH (ref 6.5–8.1)

## 2023-11-04 LAB — RESP PANEL BY RT-PCR (RSV, FLU A&B, COVID)  RVPGX2
Influenza A by PCR: NEGATIVE
Influenza B by PCR: NEGATIVE
Resp Syncytial Virus by PCR: NEGATIVE
SARS Coronavirus 2 by RT PCR: NEGATIVE

## 2023-11-04 LAB — LIPASE, BLOOD: Lipase: 455 U/L — ABNORMAL HIGH (ref 11–51)

## 2023-11-04 MED ORDER — ONDANSETRON 4 MG PO TBDP: 4.0000 mg | ORAL_TABLET | Freq: Once | ORAL | Status: DC

## 2023-11-04 MED ORDER — ONDANSETRON HCL 4 MG/2ML IJ SOLN
4.0000 mg | Freq: Once | INTRAMUSCULAR | Status: AC
  Administered 2023-11-04: 4 mg via INTRAVENOUS
  Filled 2023-11-04: qty 2

## 2023-11-04 NOTE — ED Triage Notes (Signed)
 Pt POV c/o emesis due to severe hiccups x 4-5 days.  Denies fever, diarrhea.   Pt attends dialysis m/w/f. Reports pt had to stop pulling off fluid due to lower bp during dialysis tx this week.

## 2023-11-04 NOTE — ED Notes (Signed)
 Pt actively vomiting while completing IV start. EDP made aware.

## 2023-11-04 NOTE — ED Provider Notes (Signed)
 [place] [facility] Provider Note   CSN: [id number] Arrival date & time: 11/04/23  2100     Patient presents with: Emesis and Abdominal Pain   Barry Nichols is a 48 y.o. male with history of ESRD on Monday Wednesday Friday hemodialysis, type 2 diabetes, CHF, hypertension, presents with concern for hiccuping for the past 5 days.  Also reports some nausea and vomiting that started over the past 3 days.  Denies any associated abdominal pain, fever, chills.  Reports emesis appears dark brown.  {Add pertinent medical, surgical, social history, OB history to HPI:32947}  Emesis Associated symptoms: abdominal pain   Abdominal Pain Associated symptoms: vomiting        Prior to Admission medications   Medication Sig Start Date End Date Taking? Authorizing Provider  amLODipine  (NORVASC ) 10 MG tablet Take 1 tablet (10 mg total) by mouth at bedtime. 09/14/11 05/13/23  Danton Reyes DASEN, MD  carvedilol  (COREG ) 25 MG tablet Take 1 tablet (25 mg total) by mouth 2 (two) times daily with a meal. 09/14/11 05/13/23  Danton Reyes DASEN, MD  cinacalcet (SENSIPAR) 30 MG tablet Take 30 mg by mouth daily.    [provider]  cloNIDine  (CATAPRES ) 0.3 MG tablet Take 1 tablet (0.3 mg total) by mouth 3 (three) times daily. Patient taking differently: Take 0.3 mg by mouth 2 (two) times daily. 08/27/16   Amin, Sumayya, MD  fenofibrate (TRICOR) 145 MG tablet Take 145 mg by mouth daily.    [provider]  icosapent Ethyl (VASCEPA) 1 g capsule Take 2 g by mouth 2 (two) times daily.    [provider]  insulin  degludec (TRESIBA) 200 UNIT/ML FlexTouch Pen Inject 30 Units into the skin at bedtime. 09/23/21   [provider]  insulin  lispro (HUMALOG KWIKPEN) 200 UNIT/ML KwikPen Inject 25-40 Units into the skin 3 (three) times daily as needed (high blood sugar). 09/02/20   [provider]  MOUNJARO 15 MG/0.5ML Pen Inject 15 mg into the  skin once a week.    [provider]  mycophenolate  (CELLCEPT ) 500 MG tablet Take 1,000 mg by mouth 2 (two) times daily.    [provider]  oxyCODONE  (ROXICODONE ) 5 MG immediate release tablet Take 1 tablet (5 mg total) by mouth every 4 (four) hours as needed for severe pain or breakthrough pain. Patient not taking: Reported on 05/13/2023 10/19/22   Genelle Standing, MD  predniSONE  (DELTASONE ) 5 MG tablet Take 5 mg by mouth daily.    [provider]  sodium bicarbonate  650 MG tablet Take 2 tablets (1,300 mg total) by mouth 3 (three) times daily. 05/18/23   Vann, Jessica U, DO  sulfamethoxazole-trimethoprim (BACTRIM) 400-80 MG tablet Take 1 tablet by mouth every Monday, Wednesday, and Friday.    [provider]  tacrolimus  (PROGRAF ) 1 MG capsule Take 2-3 mg by mouth See admin instructions. Take 3 capsules (3mg ) in the morning and then take 2 capsules (2mg ) at bedtime.    [provider]  tadalafil (CIALIS) 20 MG tablet Take 20 mg by mouth daily as needed for erectile dysfunction.    [provider]  traZODone (DESYREL) 50 MG tablet Take 50 mg by mouth at bedtime.    [provider]  Vitamin D, Ergocalciferol, (DRISDOL) 1.25 MG (50000 UNIT) CAPS capsule Take 50,000 Units by mouth every 7 (seven) days. 03/02/23   [provider]    Allergies: Patient has no known allergies.    Review of  Systems  Gastrointestinal:  Positive for abdominal pain and vomiting.    Updated Vital Signs BP 119/79   Pulse (!) 110   Temp 99.7 F (37.6 C)   Resp 18   Ht 5' 10 (1.778 m)   Wt 98 kg   SpO2 100%   BMI 30.99 kg/m   Physical Exam Vitals and nursing note reviewed.  Constitutional:      General: He is not in acute distress.    Appearance: He is well-developed.     Comments: Hiccuping continuously No active vomiting  HENT:     Head: Normocephalic and atraumatic.  Eyes:     Conjunctiva/sclera: Conjunctivae normal.  Cardiovascular:      Rate and Rhythm: Normal rate and regular rhythm.     Heart sounds: No murmur heard. Pulmonary:     Effort: Pulmonary effort is normal. No respiratory distress.     Breath sounds: Normal breath sounds.  Abdominal:     Palpations: Abdomen is soft.     Tenderness: There is no abdominal tenderness.  Musculoskeletal:        General: No swelling.     Cervical back: Neck supple.  Skin:    General: Skin is warm and dry.     Capillary Refill: Capillary refill takes less than 2 seconds.  Neurological:     Mental Status: He is alert.  Psychiatric:        Mood and Affect: Mood normal.     (all labs ordered are listed, but only abnormal results are displayed) Labs Reviewed  LIPASE, BLOOD - Abnormal; Notable for the following components:      Result Value   Lipase 455 (*)    All other components within normal limits  COMPREHENSIVE METABOLIC PANEL WITH GFR - Abnormal; Notable for the following components:   Sodium 132 (*)    Chloride 87 (*)    Glucose, Bld 202 (*)    BUN 50 (*)    Creatinine, Ser 12.20 (*)    Total Protein 8.3 (*)    AST 49 (*)    ALT 77 (*)    GFR, Estimated 5 (*)    Anion gap 21 (*)    All other components within normal limits  CBC - Abnormal; Notable for the following components:   RBC 3.26 (*)    Hemoglobin 9.3 (*)    HCT 28.3 (*)    All other components within normal limits  RESP PANEL BY RT-PCR (RSV, FLU A&B, COVID)  RVPGX2  URINALYSIS, ROUTINE W REFLEX MICROSCOPIC    EKG: EKG Interpretation Date/Time:  Thursday November 04 2023 21:08:33 EDT Ventricular Rate:  136 PR Interval:  127 QRS Duration:  92 QT Interval:  298 QTC Calculation: 449 R Axis:   59  Text Interpretation: new Sinus tachycardia Borderline T abnormalities, inferior leads Confirmed by Doretha Folks ([id number]) on 11/04/2023 10:38:30 PM  Radiology: CT ABDOMEN PELVIS WO CONTRAST Result Date: 11/04/2023 CLINICAL DATA:  Abdominal pain and elevated lipase, initial encounter EXAM: CT  ABDOMEN AND PELVIS WITHOUT CONTRAST TECHNIQUE: Multidetector CT imaging of the abdomen and pelvis was performed following the standard protocol without IV contrast. RADIATION DOSE REDUCTION: This exam was performed according to the departmental dose-optimization program which includes automated exposure control, adjustment of the mA and/or kV according to patient size and/or use of iterative reconstruction technique. COMPARISON:  05/12/2023 FINDINGS: Lower chest: No acute abnormality. Hepatobiliary: No focal liver abnormality is seen. No gallstones, gallbladder wall thickening, or biliary dilatation. Pancreas: Pancreas is  well visualized. No significant inflammatory changes are identified. Spleen: Normal in size without focal abnormality. Adrenals/Urinary Tract: Adrenal glands are within normal limits. Kidneys demonstrate bilateral renal cystic change stable from the prior study. No follow-up is recommended. No renal calculi or obstructive changes are seen. Right lower quadrant renal transplant is noted. Some perinephric stranding is noted stable from the prior exam. The renal sinus is not well visualized likely related to diffuse inflammatory change. 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 The bladder is decompressed. Stomach/Bowel: No obstructive or inflammatory changes of the colon are noted. The appendix is within normal limits. Small bowel and stomach are unremarkable. Vascular/Lymphatic: Aortic atherosclerosis. No enlarged abdominal or pelvic lymph nodes. Reproductive: Prostate is unremarkable. Other: No abdominal wall hernia or abnormality. No abdominopelvic ascites. Musculoskeletal: No acute or significant osseous findings. IMPRESSION: Diffuse inflammatory changes and loss of renal sinus fat in the transplant kidney consistent with the known history of rejection. Chronic changes. No evidence of pancreatitis. Electronically Signed   By: Oneil Devonshire  M.D.   On: 11/04/2023 23:14    {Document cardiac monitor, telemetry assessment procedure when appropriate:32947} Procedures   Medications Ordered in the ED  ondansetron  (ZOFRAN ) injection 4 mg (4 mg Intravenous Given 11/04/23 2201)      {Click here for ABCD2, HEART and other calculators REFRESH Note before signing:1}                              Medical Decision Making Amount and/or Complexity of Data Reviewed Labs: ordered. Radiology: ordered.  Risk Prescription drug management.     Differential diagnosis includes but is not limited to Acute cholecystitis, cholelithiasis, cholangitis, choledocholithiasis, peptic ulcer, gastritis, gastroenteritis, appendicitis, IBS, IBD, DKA, nephrolithiasis, UTI, pyelonephritis, pancreatitis, diverticulitis  ED Course:  Upon initial evaluation, patient is well-appearing, no acute distress.  Actively hiccuping on exam, but no active vomiting.  Abdomen soft and nontender to palpation.  Labs Ordered: I Ordered, and personally interpreted labs.  The pertinent results include:   CBC without leukocytosis.  Baseline hemoglobin at 9.3 CMP with mild hyponatremia at 132, elevated creatinine at 12.2, elevated BUN at 50, elevated glucose at 202, anion gap 21 Lipase 455 COVID, flu, RSV negative  Imaging Studies ordered: I ordered imaging studies including CT abdomen pelvis I independently visualized the imaging with scope of interpretation limited to determining acute life threatening conditions related to emergency care. Imaging showed  Diffuse inflammatory changes and loss of renal sinus fat in the transplant kidney consistent with the known history of rejection.  Chronic changes.  No evidence of pancreatitis. I agree with the radiologist interpretation   Cardiac Monitoring: / EKG: The patient was maintained on a cardiac monitor.  I personally viewed and interpreted the cardiac monitored which showed an underlying rhythm of: Sinus tachycardia     Consultations Obtained: I requested consultation with the ***,  and discussed lab and imaging findings as well as pertinent plan - they recommend: ***  Medications Given: Zofran   Upon re-evaluation, patient remains well-appearing.  Reports improvement in nausea with the Zofran  provided.  Discussed that his lipase was elevated and this is concerning for pancreatitis.  He denies any alcohol use.  He does have a history of hypercholesterolemia, but does take medications for this importance this is well-controlled.  No abdominal pain or evidence of gallstones on CT.  Unclear etiology as to his pancreatitis at this time.  Impression: ***  Disposition:  {AF ED Dispo:29713} Return precautions given.    Record Review: External records from outside source obtained and reviewed including ***     This chart was dictated using voice recognition software, Dragon. Despite the best efforts of this provider to proofread and correct errors, errors may still occur which can change documentation meaning.    {Document critical care time when appropriate  Document review of labs and clinical decision tools ie CHADS2VASC2, etc  Document your independent review of radiology images and any outside records  Document your discussion with family members, caretakers and with consultants  Document social determinants of health affecting pt's care  Document your decision making why or why not admission, treatments were needed:32947:::1}   Final diagnoses:  None    ED Discharge Orders     None

## 2023-11-05 DIAGNOSIS — K859 Acute pancreatitis without necrosis or infection, unspecified: Secondary | ICD-10-CM | POA: Diagnosis not present

## 2023-11-05 MED ORDER — PROCHLORPERAZINE EDISYLATE 10 MG/2ML IJ SOLN
10.0000 mg | Freq: Once | INTRAMUSCULAR | Status: AC
  Administered 2023-11-05: 10 mg via INTRAVENOUS
  Filled 2023-11-05: qty 2

## 2023-11-05 MED ORDER — SODIUM CHLORIDE 0.9 % IV BOLUS
500.0000 mL | Freq: Once | INTRAVENOUS | Status: AC
  Administered 2023-11-05: 500 mL via INTRAVENOUS

## 2023-11-05 MED ORDER — PROCHLORPERAZINE MALEATE 10 MG PO TABS: 10.0000 mg | ORAL_TABLET | Freq: Two times a day (BID) | ORAL | 0 refills | Status: DC | PRN

## 2023-11-05 NOTE — ED Notes (Signed)
 Pt failed PO trial & vomited the fluids he had just ingested. EDP aware.

## 2023-11-05 NOTE — ED Notes (Signed)
 Pt states he hasn't vomited since drinking fluids & feels much better. EDP made aware.

## 2023-11-05 NOTE — ED Notes (Signed)
 Pt given PO fluids again for PO trial.

## 2023-11-06 ENCOUNTER — Emergency Department (HOSPITAL_BASED_OUTPATIENT_CLINIC_OR_DEPARTMENT_OTHER)
Admission: EM | Admit: 2023-11-06 | Discharge: 2023-11-07 | Disposition: A | Discharge status: Home / Self Care | Attending: Emergency Medicine | Admitting: Emergency Medicine

## 2023-11-06 ENCOUNTER — Other Ambulatory Visit: Payer: Self-pay

## 2023-11-06 ENCOUNTER — Encounter (HOSPITAL_BASED_OUTPATIENT_CLINIC_OR_DEPARTMENT_OTHER): Payer: Self-pay | Admitting: Emergency Medicine

## 2023-11-06 DIAGNOSIS — R066 Hiccough: Secondary | ICD-10-CM | POA: Diagnosis not present

## 2023-11-06 DIAGNOSIS — I509 Heart failure, unspecified: Secondary | ICD-10-CM | POA: Insufficient documentation

## 2023-11-06 DIAGNOSIS — Z79899 Other long term (current) drug therapy: Secondary | ICD-10-CM | POA: Insufficient documentation

## 2023-11-06 DIAGNOSIS — I132 Hypertensive heart and chronic kidney disease with heart failure and with stage 5 chronic kidney disease, or end stage renal disease: Secondary | ICD-10-CM | POA: Insufficient documentation

## 2023-11-06 DIAGNOSIS — R112 Nausea with vomiting, unspecified: Secondary | ICD-10-CM | POA: Insufficient documentation

## 2023-11-06 DIAGNOSIS — Z794 Long term (current) use of insulin: Secondary | ICD-10-CM | POA: Insufficient documentation

## 2023-11-06 DIAGNOSIS — N186 End stage renal disease: Secondary | ICD-10-CM | POA: Insufficient documentation

## 2023-11-06 DIAGNOSIS — E1122 Type 2 diabetes mellitus with diabetic chronic kidney disease: Secondary | ICD-10-CM | POA: Insufficient documentation

## 2023-11-06 DIAGNOSIS — Z992 Dependence on renal dialysis: Secondary | ICD-10-CM | POA: Diagnosis not present

## 2023-11-06 LAB — CBC WITH DIFFERENTIAL/PLATELET
Abs Immature Granulocytes: 0.03 K/uL (ref 0.00–0.07)
Basophils Absolute: 0 K/uL (ref 0.0–0.1)
Basophils Relative: 1 %
Eosinophils Absolute: 0.3 K/uL (ref 0.0–0.5)
Eosinophils Relative: 4 %
HCT: 25.2 % — ABNORMAL LOW (ref 39.0–52.0)
Hemoglobin: 8.1 g/dL — ABNORMAL LOW (ref 13.0–17.0)
Immature Granulocytes: 1 %
Lymphocytes Relative: 19 %
Lymphs Abs: 1.2 K/uL (ref 0.7–4.0)
MCH: 28.2 pg (ref 26.0–34.0)
MCHC: 32.1 g/dL (ref 30.0–36.0)
MCV: 87.8 fL (ref 80.0–100.0)
Monocytes Absolute: 0.8 K/uL (ref 0.1–1.0)
Monocytes Relative: 12 %
Neutro Abs: 4.2 K/uL (ref 1.7–7.7)
Neutrophils Relative %: 63 %
Platelets: 247 K/uL (ref 150–400)
RBC: 2.87 MIL/uL — ABNORMAL LOW (ref 4.22–5.81)
RDW: 15.5 % (ref 11.5–15.5)
WBC: 6.5 K/uL (ref 4.0–10.5)
nRBC: 0 % (ref 0.0–0.2)

## 2023-11-06 MED ORDER — ONDANSETRON HCL 4 MG/2ML IJ SOLN
4.0000 mg | Freq: Once | INTRAMUSCULAR | Status: AC
  Administered 2023-11-06: 4 mg via INTRAVENOUS
  Filled 2023-11-06: qty 2

## 2023-11-06 NOTE — ED Notes (Signed)
 Patient refused EKG. MD Wickline made aware.

## 2023-11-06 NOTE — ED Triage Notes (Signed)
 Pt returns for continued NV and hiccups; was seen on 7/17 and initially improved, but he is now unable to tolerate po and feels dehydrated

## 2023-11-06 NOTE — ED Provider Notes (Signed)
 [place] [facility] AT [facility] Provider Note   CSN: [id number] Arrival date & time: 11/06/23  2230     Patient presents with: Emesis   Barry Nichols is a 48 y.o. male.  {Add pertinent medical, surgical, social history, OB history to YEP:67052} The history is provided by the patient and the spouse.  Patient w/history of diabetes, ESRD on dialysis presents for recurrent nausea vomiting and hiccups. Patient was seen for the symptoms several days ago but felt improved, but over the past days had return of his nausea vomiting and hiccups.  Denies any blood in his emesis.  No abdominal pain.  No fevers.  He has been having bowel movements without diarrhea Reports due to the hiccups and vomiting his throat is feeling swollen.  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 Authorizing Provider  amLODipine  (NORVASC ) 10 MG tablet Take 1 tablet (10 mg total) by mouth at bedtime. 09/14/11 05/13/23  Danton Reyes DASEN, MD  carvedilol  (COREG ) 25 MG tablet Take 1 tablet (25 mg total) by mouth 2 (two) times daily with a meal. 09/14/11 05/13/23  Danton Reyes DASEN, MD  cinacalcet  (SENSIPAR ) 30 MG tablet Take 30 mg by mouth daily.    [provider]  cloNIDine  (CATAPRES ) 0.3 MG tablet Take 1 tablet (0.3 mg total) by mouth 3 (three)  times daily. Patient taking differently: Take 0.3 mg by mouth 2 (two) times daily. 08/27/16   Amin, Sumayya, MD  fenofibrate (TRICOR) 145 MG tablet Take 145 mg by mouth daily.    [provider]  icosapent  Ethyl (VASCEPA ) 1 g capsule Take 2 g by mouth 2 (two) times daily.    [provider]  insulin  degludec (TRESIBA ) 200 UNIT/ML FlexTouch Pen Inject 30 Units into the skin at bedtime. 09/23/21   [provider]  insulin  lispro (HUMALOG KWIKPEN) 200 UNIT/ML KwikPen Inject 25-40 Units into the skin 3 (three) times daily as needed (high blood sugar). 09/02/20   [provider]  MOUNJARO 15 MG/0.5ML Pen Inject 15 mg into the skin once a week.    [provider]  mycophenolate  (CELLCEPT ) 500 MG tablet Take 1,000 mg by mouth 2 (two) times daily.    [provider]  oxyCODONE  (ROXICODONE ) 5 MG immediate release tablet Take 1 tablet (5 mg total) by mouth every 4 (four) hours as needed for severe pain or breakthrough pain. Patient not taking: Reported on 05/13/2023 10/19/22   Genelle Standing, MD  predniSONE  (DELTASONE ) 5 MG tablet Take 5 mg by mouth daily.    [provider]  prochlorperazine  (COMPAZINE ) 10 MG tablet Take 1 tablet (10 mg total) by mouth 2 (two) times daily  as needed for nausea or vomiting. 11/05/23   Jerral Meth, MD  sodium bicarbonate  650 MG tablet Take 2 tablets (1,300 mg total) by mouth 3 (three) times daily. 05/18/23   Vann, Jessica U, DO  sulfamethoxazole -trimethoprim  (BACTRIM ) 400-80 MG tablet Take 1 tablet by mouth every Monday, Wednesday, and Friday.    [provider]  tacrolimus  (PROGRAF ) 1 MG capsule Take 2-3 mg by mouth See admin instructions. Take 3 capsules (3mg ) in the morning and then take 2 capsules (2mg ) at bedtime.    [provider]  tadalafil (CIALIS) 20 MG tablet Take 20 mg by mouth daily as needed for erectile dysfunction.    [provider]  traZODone  (DESYREL ) 50 MG tablet Take 50  mg by mouth at bedtime.    [provider]  Vitamin D, Ergocalciferol, (DRISDOL) 1.25 MG (50000 UNIT) CAPS capsule Take 50,000 Units by mouth every 7 (seven) days. 03/02/23   [provider]    Allergies: Patient has no known allergies.    Review of Systems  Constitutional:  Negative for fever.  Gastrointestinal:  Positive for nausea and vomiting.    Updated Vital Signs BP 118/84 (BP Location: Right Arm)   Pulse (!) 118   Temp 99 F (37.2 C)   Resp 16   Ht 1.778 m (5' 10)   Wt 98 kg   SpO2 99%   BMI 30.99 kg/m   Physical Exam CONSTITUTIONAL: Well developed/well nourished HEAD: Normocephalic/atraumatic EYES: EOMI/PERRL ENMT: Mucous membranes moist, no stridor, no drooling, no angioedema.  Uvula midline with mild enlargement noted, but no exudates, no erythema NECK: supple no meningeal signs, no anterior neck tenderness or edema SPINE/BACK:entire spine nontender CV: S1/S2 noted, no murmurs/rubs/gallops noted LUNGS: Lungs are clear to auscultation bilaterally, no apparent distress ABDOMEN: soft, nontender, no rebound or guarding, bowel sounds noted throughout abdomen NEURO: Pt is awake/alert/appropriate, moves all extremitiesx4.  No facial droop.   EXTREMITIES: pulses normal/equal, full ROM SKIN: warm, color normal  (all labs ordered are listed, but only abnormal results are displayed) Labs Reviewed  CBC WITH DIFFERENTIAL/PLATELET - Abnormal; Notable for the following components:      Result Value   RBC 2.87 (*)    Hemoglobin 8.1 (*)    HCT 25.2 (*)    All other components within normal limits  LIPASE, BLOOD  COMPREHENSIVE METABOLIC PANEL WITH GFR    EKG: None  Radiology: No results found.  {Document cardiac monitor, telemetry assessment procedure when appropriate:32947} Procedures   Medications Ordered in the ED  ondansetron  (ZOFRAN ) injection 4 mg (4 mg Intravenous Given 11/06/23 2325)      {Click here for ABCD2, HEART and other  calculators REFRESH Note before signing:1}                              Medical Decision Making Amount and/or Complexity of Data Reviewed Labs: ordered. ECG/medicine tests: ordered.  Risk Prescription drug management.   This patient presents to the ED for concern of vomiting, this involves an extensive number of treatment options, and is a complaint that carries with it a high risk of complications and morbidity.  The differential diagnosis includes but is not limited to cholecystitis, cholelithiasis, pancreatitis, gastritis, peptic ulcer disease, appendicitis, bowel obstruction, bowel perforation, acute coronary syndrome    Comorbidities that complicate the patient evaluation: Patient's presentation is complicated by their history of diabetes and renal failure  Additional history obtained: Additional history obtained from  significant other Records reviewed Care Everywhere/External Records  Lab Tests: I Ordered, and personally interpreted labs.  The pertinent results include:  ***  Imaging Studies ordered: I ordered imaging studies including {imaging:26848}  I independently visualized and interpreted imaging which showed *** I agree with the radiologist interpretation  Cardiac Monitoring: The patient was maintained on a cardiac monitor.  I personally viewed and interpreted the cardiac monitor which showed an underlying rhythm of:  {cardiac monitor:26849}  Medicines ordered and prescription drug management: I ordered medication including Zofran  for nausea Reevaluation of the patient after these medicines showed that the patient    {resolved/improved/worsened:23923::improved}  Test Considered: Patient is low risk / negative by ***, therefore do not feel that *** is indicated.  Critical Interventions:  ***  Consultations Obtained: I requested consultation with the {consultation:26851}, and discussed  findings as well as pertinent plan - they recommend:  ***  Reevaluation: After the interventions noted above, I reevaluated the patient and found that they have :{resolved/improved/worsened:23923::improved}  Complexity of problems addressed: Patient's presentation is most consistent with  {RNEJ:73156}  Disposition: After consideration of the diagnostic results and the patient's response to treatment,  I feel that the patent would benefit from {disposition:26850}.     {Document critical care time when appropriate  Document review of labs and clinical decision tools ie CHADS2VASC2, etc  Document your independent review of radiology images and any outside records  Document your discussion with family members, caretakers and with consultants  Document social determinants of health affecting pt's care  Document your decision making why or why not admission, treatments were needed:32947:::1}   Final diagnoses:  None    ED Discharge Orders     None

## 2023-11-07 DIAGNOSIS — R112 Nausea with vomiting, unspecified: Secondary | ICD-10-CM | POA: Diagnosis not present

## 2023-11-07 LAB — COMPREHENSIVE METABOLIC PANEL WITH GFR
ALT: 72 U/L — ABNORMAL HIGH (ref 0–44)
AST: 52 U/L — ABNORMAL HIGH (ref 15–41)
Albumin: 3.4 g/dL — ABNORMAL LOW (ref 3.5–5.0)
Alkaline Phosphatase: 99 U/L (ref 38–126)
Anion gap: 22 — ABNORMAL HIGH (ref 5–15)
BUN: 52 mg/dL — ABNORMAL HIGH (ref 6–20)
CO2: 26 mmol/L (ref 22–32)
Calcium: 9.7 mg/dL (ref 8.9–10.3)
Chloride: 87 mmol/L — ABNORMAL LOW (ref 98–111)
Creatinine, Ser: 12.9 mg/dL — ABNORMAL HIGH (ref 0.61–1.24)
GFR, Estimated: 4 mL/min — ABNORMAL LOW (ref >60)
Glucose, Bld: 142 mg/dL — ABNORMAL HIGH (ref 70–99)
Potassium: 4.7 mmol/L (ref 3.5–5.1)
Sodium: 135 mmol/L (ref 135–145)
Total Bilirubin: 0.6 mg/dL (ref 0.0–1.2)
Total Protein: 7.9 g/dL (ref 6.5–8.1)

## 2023-11-07 LAB — LIPASE, BLOOD: Lipase: 145 U/L — ABNORMAL HIGH (ref 11–51)

## 2023-11-07 MED ORDER — DIPHENHYDRAMINE HCL 50 MG/ML IJ SOLN
25.0000 mg | Freq: Once | INTRAMUSCULAR | Status: AC
  Administered 2023-11-07: 25 mg via INTRAVENOUS
  Filled 2023-11-07: qty 1

## 2023-11-07 MED ORDER — CHLORPROMAZINE HCL 10 MG PO TABS: 10.0000 mg | ORAL_TABLET | Freq: Two times a day (BID) | ORAL | 0 refills | Status: DC | PRN

## 2023-11-07 MED ORDER — METOCLOPRAMIDE HCL 5 MG/ML IJ SOLN
10.0000 mg | Freq: Once | INTRAMUSCULAR | Status: AC
  Administered 2023-11-07: 10 mg via INTRAVENOUS
  Filled 2023-11-07: qty 2

## 2023-11-07 MED ORDER — SODIUM CHLORIDE 0.9 % IV BOLUS
500.0000 mL | Freq: Once | INTRAVENOUS | Status: AC
  Administered 2023-11-07: 500 mL via INTRAVENOUS

## 2023-11-09 ENCOUNTER — Emergency Department (HOSPITAL_BASED_OUTPATIENT_CLINIC_OR_DEPARTMENT_OTHER)

## 2023-11-09 ENCOUNTER — Encounter (HOSPITAL_BASED_OUTPATIENT_CLINIC_OR_DEPARTMENT_OTHER): Payer: Self-pay | Admitting: *Deleted

## 2023-11-09 ENCOUNTER — Inpatient Hospital Stay (HOSPITAL_BASED_OUTPATIENT_CLINIC_OR_DEPARTMENT_OTHER)
Admission: EM | Admit: 2023-11-09 | Discharge: 2023-11-11 | DRG: 380 | Disposition: A | Discharge status: Home / Self Care | Attending: Internal Medicine | Admitting: Internal Medicine

## 2023-11-09 ENCOUNTER — Other Ambulatory Visit: Payer: Self-pay

## 2023-11-09 DIAGNOSIS — N184 Chronic kidney disease, stage 4 (severe): Secondary | ICD-10-CM | POA: Diagnosis present

## 2023-11-09 DIAGNOSIS — D631 Anemia in chronic kidney disease: Secondary | ICD-10-CM | POA: Diagnosis present

## 2023-11-09 DIAGNOSIS — K2211 Ulcer of esophagus with bleeding: Secondary | ICD-10-CM | POA: Diagnosis not present

## 2023-11-09 DIAGNOSIS — E782 Mixed hyperlipidemia: Secondary | ICD-10-CM | POA: Diagnosis present

## 2023-11-09 DIAGNOSIS — E039 Hypothyroidism, unspecified: Secondary | ICD-10-CM | POA: Diagnosis present

## 2023-11-09 DIAGNOSIS — R066 Hiccough: Secondary | ICD-10-CM | POA: Diagnosis present

## 2023-11-09 DIAGNOSIS — T8612 Kidney transplant failure: Secondary | ICD-10-CM | POA: Diagnosis present

## 2023-11-09 DIAGNOSIS — K921 Melena: Secondary | ICD-10-CM

## 2023-11-09 DIAGNOSIS — Z7985 Long-term (current) use of injectable non-insulin antidiabetic drugs: Secondary | ICD-10-CM

## 2023-11-09 DIAGNOSIS — K2970 Gastritis, unspecified, without bleeding: Secondary | ICD-10-CM | POA: Diagnosis present

## 2023-11-09 DIAGNOSIS — Z992 Dependence on renal dialysis: Secondary | ICD-10-CM

## 2023-11-09 DIAGNOSIS — E1165 Type 2 diabetes mellitus with hyperglycemia: Secondary | ICD-10-CM | POA: Diagnosis not present

## 2023-11-09 DIAGNOSIS — I5032 Chronic diastolic (congestive) heart failure: Secondary | ICD-10-CM | POA: Diagnosis present

## 2023-11-09 DIAGNOSIS — K2100 Gastro-esophageal reflux disease with esophagitis, without bleeding: Secondary | ICD-10-CM | POA: Diagnosis present

## 2023-11-09 DIAGNOSIS — I951 Orthostatic hypotension: Secondary | ICD-10-CM | POA: Diagnosis present

## 2023-11-09 DIAGNOSIS — E1122 Type 2 diabetes mellitus with diabetic chronic kidney disease: Secondary | ICD-10-CM | POA: Diagnosis present

## 2023-11-09 DIAGNOSIS — N186 End stage renal disease: Secondary | ICD-10-CM | POA: Diagnosis present

## 2023-11-09 DIAGNOSIS — Z79899 Other long term (current) drug therapy: Secondary | ICD-10-CM

## 2023-11-09 DIAGNOSIS — R112 Nausea with vomiting, unspecified: Secondary | ICD-10-CM | POA: Diagnosis present

## 2023-11-09 DIAGNOSIS — R1110 Vomiting, unspecified: Secondary | ICD-10-CM | POA: Diagnosis not present

## 2023-11-09 DIAGNOSIS — R634 Abnormal weight loss: Secondary | ICD-10-CM | POA: Diagnosis present

## 2023-11-09 DIAGNOSIS — E785 Hyperlipidemia, unspecified: Secondary | ICD-10-CM

## 2023-11-09 DIAGNOSIS — Y830 Surgical operation with transplant of whole organ as the cause of abnormal reaction of the patient, or of later complication, without mention of misadventure at the time of the procedure: Secondary | ICD-10-CM | POA: Diagnosis present

## 2023-11-09 DIAGNOSIS — E66811 Obesity, class 1: Secondary | ICD-10-CM | POA: Diagnosis present

## 2023-11-09 DIAGNOSIS — R000 Tachycardia, unspecified: Secondary | ICD-10-CM | POA: Diagnosis present

## 2023-11-09 DIAGNOSIS — D62 Acute posthemorrhagic anemia: Secondary | ICD-10-CM | POA: Diagnosis not present

## 2023-11-09 DIAGNOSIS — Z823 Family history of stroke: Secondary | ICD-10-CM

## 2023-11-09 DIAGNOSIS — Z860101 Personal history of adenomatous and serrated colon polyps: Secondary | ICD-10-CM

## 2023-11-09 DIAGNOSIS — Z6831 Body mass index (BMI) 31.0-31.9, adult: Secondary | ICD-10-CM

## 2023-11-09 DIAGNOSIS — I132 Hypertensive heart and chronic kidney disease with heart failure and with stage 5 chronic kidney disease, or end stage renal disease: Secondary | ICD-10-CM | POA: Diagnosis present

## 2023-11-09 DIAGNOSIS — Z794 Long term (current) use of insulin: Secondary | ICD-10-CM

## 2023-11-09 DIAGNOSIS — I272 Pulmonary hypertension, unspecified: Secondary | ICD-10-CM | POA: Diagnosis present

## 2023-11-09 DIAGNOSIS — E860 Dehydration: Secondary | ICD-10-CM | POA: Diagnosis present

## 2023-11-09 DIAGNOSIS — I2489 Other forms of acute ischemic heart disease: Secondary | ICD-10-CM | POA: Diagnosis present

## 2023-11-09 DIAGNOSIS — Z7952 Long term (current) use of systemic steroids: Secondary | ICD-10-CM

## 2023-11-09 DIAGNOSIS — D649 Anemia, unspecified: Secondary | ICD-10-CM | POA: Diagnosis present

## 2023-11-09 DIAGNOSIS — K760 Fatty (change of) liver, not elsewhere classified: Secondary | ICD-10-CM | POA: Diagnosis present

## 2023-11-09 DIAGNOSIS — R748 Abnormal levels of other serum enzymes: Secondary | ICD-10-CM

## 2023-11-09 DIAGNOSIS — Z8249 Family history of ischemic heart disease and other diseases of the circulatory system: Secondary | ICD-10-CM

## 2023-11-09 DIAGNOSIS — E538 Deficiency of other specified B group vitamins: Secondary | ICD-10-CM | POA: Diagnosis present

## 2023-11-09 DIAGNOSIS — Z79621 Long term (current) use of calcineurin inhibitor: Secondary | ICD-10-CM

## 2023-11-09 DIAGNOSIS — Z792 Long term (current) use of antibiotics: Secondary | ICD-10-CM

## 2023-11-09 LAB — LIPASE, BLOOD: Lipase: 211 U/L — ABNORMAL HIGH (ref 11–51)

## 2023-11-09 LAB — COMPREHENSIVE METABOLIC PANEL WITH GFR
ALT: 80 U/L — ABNORMAL HIGH (ref 0–44)
AST: 66 U/L — ABNORMAL HIGH (ref 15–41)
Albumin: 3.3 g/dL — ABNORMAL LOW (ref 3.5–5.0)
Alkaline Phosphatase: 96 U/L (ref 38–126)
Anion gap: 20 — ABNORMAL HIGH (ref 5–15)
BUN: 53 mg/dL — ABNORMAL HIGH (ref 6–20)
CO2: 26 mmol/L (ref 22–32)
Calcium: 9.7 mg/dL (ref 8.9–10.3)
Chloride: 89 mmol/L — ABNORMAL LOW (ref 98–111)
Creatinine, Ser: 12.7 mg/dL — ABNORMAL HIGH (ref 0.61–1.24)
GFR, Estimated: 4 mL/min — ABNORMAL LOW (ref >60)
Glucose, Bld: 117 mg/dL — ABNORMAL HIGH (ref 70–99)
Potassium: 4.1 mmol/L (ref 3.5–5.1)
Sodium: 135 mmol/L (ref 135–145)
Total Bilirubin: 0.5 mg/dL (ref 0.0–1.2)
Total Protein: 8 g/dL (ref 6.5–8.1)

## 2023-11-09 LAB — CBC WITH DIFFERENTIAL/PLATELET
Abs Immature Granulocytes: 0.01 K/uL (ref 0.00–0.07)
Basophils Absolute: 0.1 K/uL (ref 0.0–0.1)
Basophils Relative: 1 %
Eosinophils Absolute: 0.5 K/uL (ref 0.0–0.5)
Eosinophils Relative: 7 %
HCT: 23.4 % — ABNORMAL LOW (ref 39.0–52.0)
Hemoglobin: 7.6 g/dL — ABNORMAL LOW (ref 13.0–17.0)
Immature Granulocytes: 0 %
Lymphocytes Relative: 18 %
Lymphs Abs: 1.3 K/uL (ref 0.7–4.0)
MCH: 28.4 pg (ref 26.0–34.0)
MCHC: 32.5 g/dL (ref 30.0–36.0)
MCV: 87.3 fL (ref 80.0–100.0)
Monocytes Absolute: 0.7 K/uL (ref 0.1–1.0)
Monocytes Relative: 11 %
Neutro Abs: 4.3 K/uL (ref 1.7–7.7)
Neutrophils Relative %: 63 %
Platelets: 324 K/uL (ref 150–400)
RBC: 2.68 MIL/uL — ABNORMAL LOW (ref 4.22–5.81)
RDW: 15.3 % (ref 11.5–15.5)
WBC: 6.8 K/uL (ref 4.0–10.5)
nRBC: 0 % (ref 0.0–0.2)

## 2023-11-09 LAB — TROPONIN T, HIGH SENSITIVITY
Troponin T High Sensitivity: 266 ng/L (ref <19)
Troponin T High Sensitivity: 247 ng/L (ref <19)

## 2023-11-09 LAB — LACTIC ACID, PLASMA: Lactic Acid, Venous: 1.7 mmol/L (ref 0.5–1.9)

## 2023-11-09 MED ORDER — ONDANSETRON HCL 4 MG/2ML IJ SOLN
4.0000 mg | Freq: Once | INTRAMUSCULAR | Status: AC
  Administered 2023-11-09: 4 mg via INTRAVENOUS
  Filled 2023-11-09: qty 2

## 2023-11-09 MED ORDER — DIPHENHYDRAMINE HCL 50 MG/ML IJ SOLN
25.0000 mg | Freq: Once | INTRAMUSCULAR | Status: AC
Start: 2023-11-09 — End: 2023-11-09
  Administered 2023-11-09: 25 mg via INTRAVENOUS
  Filled 2023-11-09: qty 1

## 2023-11-09 MED ORDER — SODIUM CHLORIDE 0.9 % IV SOLN
12.5000 mg | Freq: Once | INTRAVENOUS | Status: AC
  Administered 2023-11-09: 12.5 mg via INTRAVENOUS
  Filled 2023-11-09: qty 0.5

## 2023-11-09 MED ORDER — DROPERIDOL 2.5 MG/ML IJ SOLN
1.2500 mg | Freq: Once | INTRAMUSCULAR | Status: AC
  Administered 2023-11-09: 1.25 mg via INTRAVENOUS
  Filled 2023-11-09: qty 2

## 2023-11-09 MED ORDER — ASPIRIN 325 MG PO TABS
325.0000 mg | ORAL_TABLET | Freq: Once | ORAL | Status: AC
  Administered 2023-11-10: 325 mg via ORAL
  Filled 2023-11-09: qty 1

## 2023-11-09 MED ORDER — PROMETHAZINE HCL 25 MG/ML IJ SOLN
INTRAMUSCULAR | Status: AC
  Filled 2023-11-09: qty 1

## 2023-11-09 NOTE — ED Provider Notes (Signed)
  EMERGENCY DEPARTMENT AT [facility] Provider Note   CSN: [id number] Arrival date & time: 11/09/23  1754     Patient presents with: Hiccups and Emesis   Barry Nichols is a 48 y.o. male.   48 year old male with past medical history of end-stage renal disease and recurrent vomiting and hiccups in the past presenting to the emergency department today with recurrent nausea and vomiting.  Patient states that this has occurred despite taking Thorazine  for his hiccups.  He states that he has also been to an outside emergency department since his last emergency department visit here 2 days ago.  He reports that this is a recurring issue.  Denies any alcohol or drug use.  He states his last dialysis was yesterday.  He came to the emergency department today for further evaluation regarding this.   Emesis      Prior to Admission medications   Medication Sig Start Date End Date Taking? Authorizing Provider  amLODipine  (NORVASC ) 10 MG tablet Take 1 tablet (10 mg total) by mouth at bedtime. 09/14/11 05/13/23  Danton Reyes DASEN, MD  carvedilol  (COREG ) 25 MG tablet Take 1 tablet (25 mg total) by mouth 2 (two) times daily with a meal. 09/14/11 05/13/23  Danton Reyes DASEN, MD  chlorproMAZINE  (THORAZINE ) 10 MG tablet Take 1 tablet (10 mg total) by mouth 2 (two) times daily as needed. 11/07/23   Midge Golas, MD  cinacalcet  (SENSIPAR ) 30 MG tablet Take 30 mg by mouth daily.    [provider]  cloNIDine  (CATAPRES ) 0.3 MG tablet Take 1 tablet (0.3 mg total) by mouth 3 (three) times daily. Patient taking differently: Take 0.3 mg by mouth 2 (two) times daily. 08/27/16   Amin, Sumayya, MD  fenofibrate (TRICOR) 145 MG tablet Take 145 mg by mouth daily.    [provider]  icosapent  Ethyl (VASCEPA ) 1 g capsule Take 2 g by mouth 2 (two) times daily.    [provider]  insulin  degludec (TRESIBA ) 200 UNIT/ML FlexTouch Pen Inject 30 Units into the skin at  bedtime. 09/23/21   [provider]  insulin  lispro (HUMALOG KWIKPEN) 200 UNIT/ML KwikPen Inject 25-40 Units into the skin 3 (three) times daily as needed (high blood sugar). 09/02/20   [provider]  MOUNJARO 15 MG/0.5ML Pen Inject 15 mg into the skin once a week.    [provider]  mycophenolate  (CELLCEPT ) 500 MG tablet Take 1,000 mg by mouth 2 (two) times daily.    [provider]  predniSONE  (DELTASONE ) 5 MG tablet Take 5 mg by mouth daily.    [provider]  sodium bicarbonate  650 MG tablet Take 2 tablets (1,300 mg total) by mouth 3 (three) times daily. 05/18/23   Vann, Jessica U, DO  sulfamethoxazole -trimethoprim  (BACTRIM ) 400-80 MG tablet Take 1 tablet by mouth every Monday, Wednesday, and Friday.    [provider]  tacrolimus  (PROGRAF ) 1 MG capsule Take 2-3 mg by mouth See admin instructions. Take 3 capsules (3mg ) in the morning and then take 2 capsules (2mg ) at bedtime.    [provider]  tadalafil (CIALIS) 20 MG tablet Take 20 mg by mouth daily as needed for erectile dysfunction.    [provider]  traZODone  (DESYREL ) 50 MG tablet Take 50 mg by mouth at bedtime.    [provider]  Vitamin D, Ergocalciferol, (DRISDOL) 1.25 MG (50000 UNIT) CAPS capsule Take 50,000 Units by mouth every 7 (seven) days. 03/02/23   [provider]  Allergies: Patient has no known allergies.    Review of Systems  Gastrointestinal:  Positive for nausea and vomiting.  All other systems reviewed and are negative.   Updated Vital Signs BP 136/81   Pulse (!) 105   Temp 98.2 F (36.8 C) (Oral)   Resp 16   SpO2 98%   Physical Exam Vitals and nursing note reviewed.   Gen: NAD, hiccups noted throughout interview Eyes: PERRL, EOMI HEENT: no oropharyngeal swelling Neck: trachea midline Resp: clear to auscultation bilaterally Card: RRR, no murmurs, rubs, or gallops Abd: Mild diffuse tenderness with no guarding  or rebound Extremities: no calf tenderness, no edema Vascular: 2+ radial pulses bilaterally, 2+ DP pulses bilaterally Neuro: No focal deficits Skin: no rashes Psyc: acting appropriately   (all labs ordered are listed, but only abnormal results are displayed) Labs Reviewed  CBC WITH DIFFERENTIAL/PLATELET - Abnormal; Notable for the following components:      Result Value   RBC 2.68 (*)    Hemoglobin 7.6 (*)    HCT 23.4 (*)    All other components within normal limits  COMPREHENSIVE METABOLIC PANEL WITH GFR - Abnormal; Notable for the following components:   Chloride 89 (*)    Glucose, Bld 117 (*)    BUN 53 (*)    Creatinine, Ser 12.70 (*)    Albumin 3.3 (*)    AST 66 (*)    ALT 80 (*)    GFR, Estimated 4 (*)    Anion gap 20 (*)    All other components within normal limits  LIPASE, BLOOD - Abnormal; Notable for the following components:   Lipase 211 (*)    All other components within normal limits  TROPONIN T, HIGH SENSITIVITY - Abnormal; Notable for the following components:   Troponin T High Sensitivity 266 (*)    All other components within normal limits  TROPONIN T, HIGH SENSITIVITY - Abnormal; Notable for the following components:   Troponin T High Sensitivity 247 (*)    All other components within normal limits  LACTIC ACID, PLASMA  LACTIC ACID, PLASMA    EKG: EKG Interpretation Date/Time:  Tuesday November 09 2023 18:24:48 EDT Ventricular Rate:  108 PR Interval:  125 QRS Duration:  92 QT Interval:  315 QTC Calculation: 423 R Axis:   42  Text Interpretation: Sinus tachycardia Atrial premature complex Nonspecific T abnormalities, inferior leads Confirmed by Ula Barter [phone]) on 11/09/2023 6:33:35 PM  Radiology: DG Chest Portable 1 View Result Date: 11/09/2023 CLINICAL DATA:  SOB EXAM: PORTABLE CHEST 1 VIEW COMPARISON:  Chest x-ray 11/09/2023 FINDINGS: Prominence of the cardiac silhouette likely due to AP portable technique and low lung volumes. The heart and  mediastinal contours unchanged. Atherosclerotic plaque Low lung volumes. No focal consolidation. No pulmonary edema. No pleural effusion. No pneumothorax. No acute osseous abnormality. IMPRESSION: Low lung volumes with no active disease. Electronically Signed   By: Morgane  Naveau M.D.   On: 11/09/2023 19:21     Procedures   Medications Ordered in the ED  promethazine  (PHENERGAN ) 25 MG/ML injection (  Not Given 11/09/23 1911)  ondansetron  (ZOFRAN ) injection 4 mg (has no administration in time range)  droperidol  (INAPSINE ) 2.5 MG/ML injection 1.25 mg (1.25 mg Intravenous Given 11/09/23 1844)  diphenhydrAMINE  (BENADRYL ) injection 25 mg (25 mg Intravenous Given 11/09/23 1844)  promethazine  (PHENERGAN ) 12.5 mg in sodium chloride  0.9 % 50 mL IVPB (0 mg Intravenous Stopped 11/09/23 1935)  Medical Decision Making 48 year old male with past medical history of end-stage renal disease presenting to the emergency department today with hiccups, nausea, and vomiting.  I will further evaluate the patient here with basic labs including LFTs and lipase to evaluate for hepatobiliary pathology or pancreatitis.  His initial EKG does have some T wave inversions in the inferior and lateral leads.  Will also add on a troponin to evaluate for ischemia although this does appear relatively similar to his previous EKG.  His QTc is within normal limits will give the patient Inapsine  and Benadryl  and reevaluate.  The patient continued to have vomiting although after his second antiemetic he did have some improvement.  Unfortunately did start to vomit again.  He remains mildly tachycardic here.  Labs are reassuring with relatively stable hemoglobin.  Lipase is mildly elevated and persistently elevated.  Unclear if patient does have mild pancreatitis but he is denying any pain currently.  Given his multiple ER visits and recurrent symptoms and persistent vomiting call was placed to hospital  service for admission.  Amount and/or Complexity of Data Reviewed Labs: ordered. Radiology: ordered.  Risk Prescription drug management. Decision regarding hospitalization.        Final diagnoses:  Intractable vomiting  Elevated lipase    ED Discharge Orders     None          Ula Prentice SAUNDERS, MD 11/09/23 2247

## 2023-11-09 NOTE — ED Notes (Signed)
 Ambulatory to restroom

## 2023-11-09 NOTE — Care Plan (Addendum)
 [facility] emergency department to [facility] progressive unit transfer:  48 year old man history ESRD dialysis dependence s/p renal transplant, failed renal transplant since 2022, essential hypertension, DM type II, hepatic steatosis and hypothyroidism has been presented to emergency department complaining of recurrent vomiting and hiccups. Patient states that this has occurred despite taking Thorazine  for his hiccups. He states that he has also been to an outside emergency department since his last emergency department visit here 2 days ago. He reports that this is a recurring issue. Denies any alcohol or drug use. He states his last dialysis was yesterday. He came to the emergency department today for further evaluation regarding this.   At presentation to ED patient found tachycardic and borderline hypertensive blood pressure has been improved and heart rate has been improved as well.  Found to have elevated troponin 266 which has been trended down to 247. EKG showed sinus tachycardia with premature atrial complex and nonspecific T wave abnormality.  Lab work, normal lactic acid level.  Elevated lipase 211. CMP showing elevated BUN 53, creatinine 12, elevated AST/ALT which is at baseline. CBC unremarkable stable H&H and normal platelet count.  Chest x-ray no acute abnormality except low lung volume without any active disease.  In the ED patient continues to have intractable hiccups with associated nausea and vomiting.  Patient denies any marijuana use.  Patient received Benadryl , Zofran  Phenergan  and droperidol .  Patient denies any chest pain and chest pressure.  No evidence of shortness of breath.  Due to elevated troponin requested Dr. Ula to consult and call on-call cardiology for further recommendation.  Recommended to give aspirin  325 mg.  Hospitalist has been consulted for further evaluation management of intractable nausea/vomiting and hiccups and elevated troponin/demand  ischemia.  Update, Dr. Ula has been spoken with on-call cardiology recommended to obtain echocardiogram and at some point cardiology will do any stress test . Cardiology will do stress test at some point and will see for formal consult tomorrow.     [facility] will assume care on arrival to accepting facility. Until arrival, care as per EDP. However, [facility] available 24/7 for questions and assistance. Check [email or web address] for on-call coverage. Nursing staff, please call [facility] Admits & Consults System-Wide number under Amion on patient's arrival so appropriate admitting provider can evaluate the pt.   Author: Shavawn Stobaugh, MD  [facility]

## 2023-11-09 NOTE — ED Notes (Signed)
 Hiccups subsided at this time; patient resting comfortably; spouse at bedside

## 2023-11-09 NOTE — ED Notes (Signed)
 Patient began to gasp for breath, clearly in respiratory distress; began to vomit (yellow/ green bile); HOB elevated to 90 distress, patient able to open airway spontaneously; then was able to breath normally; HR 142 bpm; patient able to recover; HR resolved to 110 bpm; O2 Sat 96 % room air; blood pressure WNL; MD Tee notified

## 2023-11-09 NOTE — ED Triage Notes (Addendum)
 Pt reporting continued hiccups and vomiting since 7/17. Patient seen at [facility] and was told it was due to pancreatic inflammation. Patient continued to have hiccups that improve slightly but then return. Today the hiccups were so frequent he felt like he could not breath. No hiccups at this time.   Patient is a dialysis patient who lost a kidney transplant in January due to dehydration. Patient reporting feeling dehydrated at this time due to vomiting.

## 2023-11-10 ENCOUNTER — Inpatient Hospital Stay (HOSPITAL_COMMUNITY)

## 2023-11-10 DIAGNOSIS — E782 Mixed hyperlipidemia: Secondary | ICD-10-CM

## 2023-11-10 DIAGNOSIS — D631 Anemia in chronic kidney disease: Secondary | ICD-10-CM | POA: Diagnosis present

## 2023-11-10 DIAGNOSIS — N186 End stage renal disease: Secondary | ICD-10-CM | POA: Diagnosis present

## 2023-11-10 DIAGNOSIS — R066 Hiccough: Secondary | ICD-10-CM

## 2023-11-10 DIAGNOSIS — Z6831 Body mass index (BMI) 31.0-31.9, adult: Secondary | ICD-10-CM | POA: Diagnosis not present

## 2023-11-10 DIAGNOSIS — R000 Tachycardia, unspecified: Secondary | ICD-10-CM | POA: Diagnosis present

## 2023-11-10 DIAGNOSIS — K921 Melena: Secondary | ICD-10-CM | POA: Diagnosis not present

## 2023-11-10 DIAGNOSIS — Z794 Long term (current) use of insulin: Secondary | ICD-10-CM | POA: Diagnosis not present

## 2023-11-10 DIAGNOSIS — R7989 Other specified abnormal findings of blood chemistry: Secondary | ICD-10-CM

## 2023-11-10 DIAGNOSIS — R1110 Vomiting, unspecified: Secondary | ICD-10-CM | POA: Diagnosis present

## 2023-11-10 DIAGNOSIS — I132 Hypertensive heart and chronic kidney disease with heart failure and with stage 5 chronic kidney disease, or end stage renal disease: Secondary | ICD-10-CM | POA: Diagnosis present

## 2023-11-10 DIAGNOSIS — E785 Hyperlipidemia, unspecified: Secondary | ICD-10-CM

## 2023-11-10 DIAGNOSIS — I272 Pulmonary hypertension, unspecified: Secondary | ICD-10-CM | POA: Diagnosis present

## 2023-11-10 DIAGNOSIS — R112 Nausea with vomiting, unspecified: Secondary | ICD-10-CM

## 2023-11-10 DIAGNOSIS — E1122 Type 2 diabetes mellitus with diabetic chronic kidney disease: Secondary | ICD-10-CM | POA: Diagnosis present

## 2023-11-10 DIAGNOSIS — D62 Acute posthemorrhagic anemia: Secondary | ICD-10-CM | POA: Diagnosis not present

## 2023-11-10 DIAGNOSIS — E860 Dehydration: Secondary | ICD-10-CM

## 2023-11-10 DIAGNOSIS — I2489 Other forms of acute ischemic heart disease: Secondary | ICD-10-CM | POA: Diagnosis present

## 2023-11-10 DIAGNOSIS — T8612 Kidney transplant failure: Secondary | ICD-10-CM | POA: Diagnosis present

## 2023-11-10 DIAGNOSIS — R634 Abnormal weight loss: Secondary | ICD-10-CM | POA: Diagnosis present

## 2023-11-10 DIAGNOSIS — R748 Abnormal levels of other serum enzymes: Secondary | ICD-10-CM | POA: Diagnosis not present

## 2023-11-10 DIAGNOSIS — E039 Hypothyroidism, unspecified: Secondary | ICD-10-CM | POA: Diagnosis present

## 2023-11-10 DIAGNOSIS — E538 Deficiency of other specified B group vitamins: Secondary | ICD-10-CM | POA: Diagnosis present

## 2023-11-10 DIAGNOSIS — I951 Orthostatic hypotension: Secondary | ICD-10-CM | POA: Diagnosis not present

## 2023-11-10 DIAGNOSIS — N184 Chronic kidney disease, stage 4 (severe): Secondary | ICD-10-CM | POA: Diagnosis not present

## 2023-11-10 DIAGNOSIS — I5032 Chronic diastolic (congestive) heart failure: Secondary | ICD-10-CM | POA: Diagnosis present

## 2023-11-10 DIAGNOSIS — K2100 Gastro-esophageal reflux disease with esophagitis, without bleeding: Secondary | ICD-10-CM | POA: Diagnosis present

## 2023-11-10 DIAGNOSIS — Z992 Dependence on renal dialysis: Secondary | ICD-10-CM | POA: Diagnosis not present

## 2023-11-10 DIAGNOSIS — Y830 Surgical operation with transplant of whole organ as the cause of abnormal reaction of the patient, or of later complication, without mention of misadventure at the time of the procedure: Secondary | ICD-10-CM | POA: Diagnosis present

## 2023-11-10 DIAGNOSIS — K760 Fatty (change of) liver, not elsewhere classified: Secondary | ICD-10-CM | POA: Diagnosis present

## 2023-11-10 DIAGNOSIS — K2211 Ulcer of esophagus with bleeding: Secondary | ICD-10-CM | POA: Diagnosis present

## 2023-11-10 DIAGNOSIS — K2210 Ulcer of esophagus without bleeding: Secondary | ICD-10-CM | POA: Diagnosis not present

## 2023-11-10 DIAGNOSIS — K2970 Gastritis, unspecified, without bleeding: Secondary | ICD-10-CM | POA: Diagnosis present

## 2023-11-10 DIAGNOSIS — E1165 Type 2 diabetes mellitus with hyperglycemia: Secondary | ICD-10-CM | POA: Diagnosis not present

## 2023-11-10 LAB — RENAL FUNCTION PANEL
Albumin: 1.9 g/dL — ABNORMAL LOW (ref 3.5–5.0)
Anion gap: 18 — ABNORMAL HIGH (ref 5–15)
BUN: 67 mg/dL — ABNORMAL HIGH (ref 6–20)
CO2: 24 mmol/L (ref 22–32)
Calcium: 8.6 mg/dL — ABNORMAL LOW (ref 8.9–10.3)
Chloride: 92 mmol/L — ABNORMAL LOW (ref 98–111)
Creatinine, Ser: 14.75 mg/dL — ABNORMAL HIGH (ref 0.61–1.24)
GFR, Estimated: 4 mL/min — ABNORMAL LOW (ref >60)
Glucose, Bld: 127 mg/dL — ABNORMAL HIGH (ref 70–99)
Phosphorus: 9 mg/dL — ABNORMAL HIGH (ref 2.5–4.6)
Potassium: 3.7 mmol/L (ref 3.5–5.1)
Sodium: 134 mmol/L — ABNORMAL LOW (ref 135–145)

## 2023-11-10 LAB — LACTIC ACID, PLASMA: Lactic Acid, Venous: 1 mmol/L (ref 0.5–1.9)

## 2023-11-10 LAB — CBC
HCT: 20.1 % — ABNORMAL LOW (ref 39.0–52.0)
Hemoglobin: 6.5 g/dL — CL (ref 13.0–17.0)
MCH: 28.5 pg (ref 26.0–34.0)
MCHC: 32.3 g/dL (ref 30.0–36.0)
MCV: 88.2 fL (ref 80.0–100.0)
Platelets: 312 K/uL (ref 150–400)
RBC: 2.28 MIL/uL — ABNORMAL LOW (ref 4.22–5.81)
RDW: 15.3 % (ref 11.5–15.5)
WBC: 5.1 K/uL (ref 4.0–10.5)
nRBC: 0 % (ref 0.0–0.2)

## 2023-11-10 LAB — GLUCOSE, CAPILLARY
Glucose-Capillary: 96 mg/dL (ref 70–99)
Glucose-Capillary: 100 mg/dL — ABNORMAL HIGH (ref 70–99)
Glucose-Capillary: 144 mg/dL — ABNORMAL HIGH (ref 70–99)
Glucose-Capillary: 128 mg/dL — ABNORMAL HIGH (ref 70–99)
Glucose-Capillary: 124 mg/dL — ABNORMAL HIGH (ref 70–99)

## 2023-11-10 LAB — HEMOGLOBIN A1C
Hgb A1c MFr Bld: 9.3 % — ABNORMAL HIGH (ref 4.8–5.6)
Mean Plasma Glucose: 220.21 mg/dL

## 2023-11-10 LAB — ECHOCARDIOGRAM COMPLETE
Area-P 1/2: 3.76 cm2
Height: 70 in
S' Lateral: 3.2 cm
Weight: 3477.98 [oz_av]

## 2023-11-10 LAB — TROPONIN I (HIGH SENSITIVITY)
Troponin I (High Sensitivity): 21 ng/L — ABNORMAL HIGH (ref <18)
Troponin I (High Sensitivity): 21 ng/L — ABNORMAL HIGH (ref <18)

## 2023-11-10 LAB — PREPARE RBC (CROSSMATCH)

## 2023-11-10 LAB — HEPATITIS B SURFACE ANTIGEN: Hepatitis B Surface Ag: NONREACTIVE

## 2023-11-10 MED ORDER — ONDANSETRON HCL 4 MG/2ML IJ SOLN
4.0000 mg | Freq: Four times a day (QID) | INTRAMUSCULAR | Status: DC | PRN
  Administered 2023-11-10: 4 mg via INTRAVENOUS
  Filled 2023-11-10: qty 2

## 2023-11-10 MED ORDER — PANTOPRAZOLE SODIUM 40 MG IV SOLR
40.0000 mg | Freq: Two times a day (BID) | INTRAVENOUS | Status: DC
  Administered 2023-11-10 – 2023-11-11 (×4): 40 mg via INTRAVENOUS
  Filled 2023-11-10 (×3): qty 10

## 2023-11-10 MED ORDER — LIDOCAINE HCL (PF) 1 % IJ SOLN: 5.0000 mL | INTRAMUSCULAR | Status: DC | PRN

## 2023-11-10 MED ORDER — TACROLIMUS 1 MG PO CAPS
3.0000 mg | ORAL_CAPSULE | Freq: Every morning | ORAL | Status: DC
  Filled 2023-11-10: qty 3

## 2023-11-10 MED ORDER — PREDNISONE 5 MG PO TABS
5.0000 mg | ORAL_TABLET | Freq: Every day | ORAL | Status: DC
  Administered 2023-11-10 – 2023-11-11 (×2): 5 mg via ORAL
  Filled 2023-11-10 (×3): qty 1

## 2023-11-10 MED ORDER — SODIUM BICARBONATE 650 MG PO TABS
1300.0000 mg | ORAL_TABLET | Freq: Three times a day (TID) | ORAL | Status: DC
  Administered 2023-11-10 – 2023-11-11 (×3): 1300 mg via ORAL
  Filled 2023-11-10 (×4): qty 2

## 2023-11-10 MED ORDER — MYCOPHENOLATE MOFETIL 250 MG PO CAPS
1000.0000 mg | ORAL_CAPSULE | Freq: Two times a day (BID) | ORAL | Status: DC
  Filled 2023-11-10: qty 4

## 2023-11-10 MED ORDER — ALTEPLASE 2 MG IJ SOLR: 2.0000 mg | Freq: Once | INTRAMUSCULAR | Status: DC | PRN

## 2023-11-10 MED ORDER — TACROLIMUS 1 MG PO CAPS
2.0000 mg | ORAL_CAPSULE | Freq: Every day | ORAL | Status: DC
  Filled 2023-11-10: qty 2

## 2023-11-10 MED ORDER — INSULIN ASPART 100 UNIT/ML IJ SOLN
0.0000 [IU] | Freq: Three times a day (TID) | INTRAMUSCULAR | Status: DC
  Administered 2023-11-11: 1 [IU] via SUBCUTANEOUS

## 2023-11-10 MED ORDER — ONDANSETRON HCL 4 MG/2ML IJ SOLN
4.0000 mg | Freq: Three times a day (TID) | INTRAMUSCULAR | Status: DC
  Administered 2023-11-10 – 2023-11-11 (×4): 4 mg via INTRAVENOUS
  Filled 2023-11-10 (×4): qty 2

## 2023-11-10 MED ORDER — HEPARIN SODIUM (PORCINE) 1000 UNIT/ML IJ SOLN
INTRAMUSCULAR | Status: AC
  Filled 2023-11-10: qty 1

## 2023-11-10 MED ORDER — PANTOPRAZOLE SODIUM 40 MG PO TBEC: 40.0000 mg | DELAYED_RELEASE_TABLET | Freq: Two times a day (BID) | ORAL | Status: DC

## 2023-11-10 MED ORDER — HEPARIN SODIUM (PORCINE) 1000 UNIT/ML DIALYSIS: 1000.0000 [IU] | INTRAMUSCULAR | Status: DC | PRN

## 2023-11-10 MED ORDER — PENTAFLUOROPROP-TETRAFLUOROETH EX AERO: 1.0000 | INHALATION_SPRAY | CUTANEOUS | Status: DC | PRN

## 2023-11-10 MED ORDER — INSULIN GLARGINE-YFGN 100 UNIT/ML ~~LOC~~ SOLN
30.0000 [IU] | Freq: Every day | SUBCUTANEOUS | Status: DC
  Administered 2023-11-10: 30 [IU] via SUBCUTANEOUS
  Filled 2023-11-10 (×2): qty 0.3

## 2023-11-10 MED ORDER — INSULIN ASPART 100 UNIT/ML IJ SOLN: 0.0000 [IU] | Freq: Every day | INTRAMUSCULAR | Status: DC

## 2023-11-10 MED ORDER — ICOSAPENT ETHYL 1 G PO CAPS
2.0000 g | ORAL_CAPSULE | Freq: Two times a day (BID) | ORAL | Status: DC
Start: 2023-11-10 — End: 2023-11-11
  Administered 2023-11-10 (×2): 2 g via ORAL
  Filled 2023-11-10 (×5): qty 2

## 2023-11-10 MED ORDER — SODIUM CHLORIDE 0.9% IV SOLUTION
Freq: Once | INTRAVENOUS | Status: AC
  Administered 2023-11-11: 75 mL via INTRAVENOUS

## 2023-11-10 MED ORDER — HEPARIN SODIUM (PORCINE) 1000 UNIT/ML DIALYSIS
2000.0000 [IU] | Freq: Once | INTRAMUSCULAR | Status: AC
  Administered 2023-11-10: 2000 [IU] via INTRAVENOUS_CENTRAL

## 2023-11-10 MED ORDER — SODIUM CHLORIDE 0.9 % IV SOLN: INTRAVENOUS | Status: DC

## 2023-11-10 MED ORDER — CHLORPROMAZINE HCL 25 MG/ML IJ SOLN
25.0000 mg | Freq: Three times a day (TID) | INTRAMUSCULAR | Status: DC | PRN
  Filled 2023-11-10: qty 1

## 2023-11-10 MED ORDER — OLANZAPINE 5 MG PO TABS
5.0000 mg | ORAL_TABLET | Freq: Every day | ORAL | Status: DC
  Administered 2023-11-10 – 2023-11-11 (×2): 5 mg via ORAL
  Filled 2023-11-10 (×4): qty 1

## 2023-11-10 MED ORDER — TACROLIMUS 1 MG PO CAPS: 2.0000 mg | ORAL_CAPSULE | ORAL | Status: DC

## 2023-11-10 MED ORDER — METOPROLOL SUCCINATE ER 25 MG PO TB24
25.0000 mg | ORAL_TABLET | Freq: Every evening | ORAL | Status: DC
  Administered 2023-11-10: 25 mg via ORAL
  Filled 2023-11-10: qty 1

## 2023-11-10 MED ORDER — INSULIN DEGLUDEC 200 UNIT/ML ~~LOC~~ SOPN: 30.0000 [IU] | PEN_INJECTOR | Freq: Every day | SUBCUTANEOUS | Status: DC

## 2023-11-10 MED ORDER — SULFAMETHOXAZOLE-TRIMETHOPRIM 400-80 MG PO TABS
1.0000 | ORAL_TABLET | ORAL | Status: DC
Start: 2023-11-10 — End: 2023-11-11
  Administered 2023-11-10: 1 via ORAL
  Filled 2023-11-10: qty 1

## 2023-11-10 MED ORDER — TRAZODONE HCL 50 MG PO TABS
50.0000 mg | ORAL_TABLET | Freq: Every day | ORAL | Status: DC
  Administered 2023-11-10: 50 mg via ORAL
  Filled 2023-11-10: qty 1

## 2023-11-10 MED ORDER — SODIUM CHLORIDE 0.9 % IV SOLN
25.0000 mg | Freq: Three times a day (TID) | INTRAVENOUS | Status: DC | PRN
  Administered 2023-11-10: 25 mg via INTRAVENOUS
  Filled 2023-11-10: qty 1

## 2023-11-10 MED ORDER — CHLORPROMAZINE HCL 50 MG/2ML IJ SOLN
50.0000 mg | Freq: Once | INTRAMUSCULAR | Status: DC
  Filled 2023-11-10 (×2): qty 2

## 2023-11-10 MED ORDER — BACLOFEN 5 MG HALF TABLET
5.0000 mg | ORAL_TABLET | Freq: Two times a day (BID) | ORAL | Status: DC
  Administered 2023-11-10: 5 mg via ORAL

## 2023-11-10 MED ORDER — SODIUM CHLORIDE 0.9 % IV BOLUS: 1000.0000 mL | Freq: Once | INTRAVENOUS | Status: DC

## 2023-11-10 MED ORDER — ACETAMINOPHEN 325 MG PO TABS
650.0000 mg | ORAL_TABLET | Freq: Four times a day (QID) | ORAL | Status: DC | PRN
  Administered 2023-11-10: 650 mg via ORAL
  Filled 2023-11-10: qty 2

## 2023-11-10 MED ORDER — NEPRO/CARBSTEADY PO LIQD
237.0000 mL | ORAL | Status: DC | PRN
Start: 2023-11-10 — End: 2023-11-10

## 2023-11-10 MED ORDER — ASPIRIN 81 MG PO TBEC: 81.0000 mg | DELAYED_RELEASE_TABLET | Freq: Every day | ORAL | Status: DC

## 2023-11-10 MED ORDER — CINACALCET HCL 30 MG PO TABS
30.0000 mg | ORAL_TABLET | Freq: Every day | ORAL | Status: DC
  Administered 2023-11-10 – 2023-11-11 (×2): 30 mg via ORAL
  Filled 2023-11-10 (×4): qty 1

## 2023-11-10 MED ORDER — CHLORHEXIDINE GLUCONATE CLOTH 2 % EX PADS
6.0000 | MEDICATED_PAD | Freq: Every day | CUTANEOUS | Status: DC
  Administered 2023-11-11: 6 via TOPICAL

## 2023-11-10 MED ORDER — HEPARIN SODIUM (PORCINE) 5000 UNIT/ML IJ SOLN
5000.0000 [IU] | Freq: Three times a day (TID) | INTRAMUSCULAR | Status: DC
  Administered 2023-11-10 – 2023-11-11 (×3): 5000 [IU] via SUBCUTANEOUS
  Filled 2023-11-10 (×4): qty 1

## 2023-11-10 MED ORDER — CHLORPROMAZINE HCL 50 MG/2ML IJ SOLN
50.0000 mg | Freq: Once | INTRAMUSCULAR | Status: AC
  Administered 2023-11-10: 50 mg via INTRAMUSCULAR
  Filled 2023-11-10: qty 2

## 2023-11-10 MED ORDER — LIDOCAINE-PRILOCAINE 2.5-2.5 % EX CREA: 1.0000 | TOPICAL_CREAM | CUTANEOUS | Status: DC | PRN

## 2023-11-10 MED ORDER — SODIUM CHLORIDE 0.9% FLUSH
3.0000 mL | Freq: Two times a day (BID) | INTRAVENOUS | Status: DC
  Administered 2023-11-10 – 2023-11-11 (×3): 3 mL via INTRAVENOUS

## 2023-11-10 MED ORDER — CARVEDILOL 12.5 MG PO TABS: 12.5000 mg | ORAL_TABLET | Freq: Two times a day (BID) | ORAL | Status: DC

## 2023-11-10 MED ORDER — ANTICOAGULANT SODIUM CITRATE 4% (200MG/5ML) IV SOLN: 5.0000 mL | Status: DC | PRN

## 2023-11-10 MED ORDER — BACLOFEN 5 MG HALF TABLET
5.0000 mg | ORAL_TABLET | Freq: Two times a day (BID) | ORAL | Status: DC
  Filled 2023-11-10: qty 1

## 2023-11-10 NOTE — Progress Notes (Signed)
 Echocardiogram 2D Echocardiogram has been performed.  Damien FALCON Sarena Jezek RDCS 11/10/2023, 12:21 PM

## 2023-11-10 NOTE — Consult Note (Cosign Needed)
 Consultation Note   Referring Provider:  [facility] PCP: [name], PA-C Primary Gastroenterologist:   [facility]      Reason for Consultation: Hiccups, vomiting DOA: 11/09/2023         Hospital Day: 2   ASSESSMENT     48 y.o. 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  Reflux related? Related to uremia? Doubt phrenic nerve pathology or more ominous causes  Black stool  Two black BMs this week. Hgb stable. Rule out PUD, erosive disease, AVMs.   Elevated lipase ( 477) on admission Lipase 211 yesterday.  Clinically does not appear to have pancreatitis.  No evidence for pancreatitis on noncontrast CT scan.  Possibly decreased clearance of lipase in the setting of renal disease?  Chronic anemia Probably anemia or chronic disease. Does have folate deficiency. No evidence for iron deficiency  History of adenomatous colon polyps 2 tubular adenomas removed at time of colonoscopy on 10/19/2023 at [facility]  Mildly elevated liver enzymes Hepatic steatosis Liver enzymes less than 3 times the upper limits of normal.  Probably okay to continue fenofibrate if drug beneficial from cardiac standpoint and also may help in treatment of hepatic steatosis  Elevated troponin Cardiology has evaluated and elevated serum troponin nonspecific.  No indication for evaluation / follow-up  Folate Deficiency Cardiology has recommended discontinuation of fenofibrate in the setting of elevated transaminases.    Family history of pancreatic cancer Mother diagnosed with pancreatic cancer last month  See PMH for additional history  Principal Problem:   Intractable nausea and  vomiting Active Problems:   Abnormal cardiac enzyme level   Orthostatic hypotension   Mixed hypercholesterolemia and hypertriglyceridemia   End-stage renal disease on hemodialysis (HCC)      PLAN:   --Continue twice daily IV PPI --Folate repletion per primary team --Schedule for EGD. The risks and benefits of EGD with possible biopsies were discussed with the patient who agrees to proceed.  -- Outpatient follow-up with [facility] regarding colon polyps /inadequate bowel prep on recent colonoscopy   HPI   48 y.o. 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  He has had a several pound weight loss associated with symptoms .  Denies nausea.  Really describes more of vomiting due to hiccups .  No abdominal pain or chest pain .  He does give a history of intermittent heartburn over the last couple years but has not been treated .  Recently seen several times in the ED for the same symptoms . Prescribed Thorazine  which provided only only transient relief.  In the ED his lipase was elevated in the 400s, liver enzymes mildly elevated.  Total bilirubin and alkaline phosphatase normal. No evidence for pancreatitis  or hepatobiliary abnormalities on noncontrast CT scan.  Barry Nichols is a non-smoker.  No alcohol use.   Barry Nichols recently went for screening colonoscopy at [facility].  2 adenomatous colon polyps were removed.  His bowel prep was not adequate.   Labs and Imaging:  Recent Labs    11/09/23 1813  PROT 8.0  ALBUMIN 3.3*  AST 66*  ALT 80*  ALKPHOS 96  BILITOT 0.5   Recent Labs    11/09/23 1813  WBC 6.8  HGB 7.6*  HCT 23.4*  MCV 87.3  PLT 324   Recent Labs     11/09/23 1813  NA 135  K 4.1  CL 89*  CO2 26  GLUCOSE 117*  BUN 53*  CREATININE 12.70*  CALCIUM 9.7     ECHOCARDIOGRAM COMPLETE    ECHOCARDIOGRAM REPORT       Patient Name:   Barry Nichols Date of Exam: 11/10/2023 Medical Rec #:  [id number]           Height:       70.0 in Accession #:    [id number]          Weight:       217.4 lb Date of Birth:  27-Mar-1976           BSA:          2.163 m Patient Age:    48 years            BP:           132/89 mmHg Patient Gender: M                   HR:           105 bpm. Exam Location:  Inpatient  Procedure: 2D Echo, Cardiac Doppler and Color Doppler (Both Spectral and Color            Flow Doppler were utilized during procedure).  Indications:    Elevated Troponins   History:        Patient has no prior history of Echocardiogram examinations.                 ESRD; Risk Factors:Hypertension and Diabetes.   Sonographer:    Damien Senior RDCS Referring Phys: SUBRINA SUNDIL  IMPRESSIONS   1. There is mid cavity acceleration of up to , unable to augment with Valsalva. Left ventricular ejection fraction, by estimation, is 65 to 70%. The left ventricle has normal function. The left ventricle has no regional wall motion abnormalities.  There is mild concentric left ventricular hypertrophy. Left ventricular diastolic parameters are consistent with Grade I diastolic dysfunction (impaired relaxation).  2. Right ventricular systolic function is normal. The right ventricular size is normal. Tricuspid regurgitation signal is inadequate for assessing PA pressure.  3. The mitral valve is normal in structure. Trivial mitral valve regurgitation.  4. The aortic valve has an indeterminant number of cusps. Aortic valve regurgitation is trivial.  5. The inferior vena cava is normal in size with greater than 50% respiratory variability, suggesting right atrial pressure of 3 mmHg.  FINDINGS  Left Ventricle: There is mid cavity acceleration of up to  , unable to augment with Valsalva. Left ventricular ejection fraction, by estimation, is 65 to 70%. The left ventricle has normal function. The left ventricle has no regional wall motion  abnormalities. The left ventricular internal cavity size was normal in size. There is mild concentric left ventricular hypertrophy. Left ventricular diastolic parameters are consistent  with Grade I diastolic dysfunction (impaired relaxation).  Right Ventricle: The right ventricular size is normal. No increase in right ventricular wall thickness. Right ventricular systolic function is normal. Tricuspid regurgitation signal is inadequate for assessing PA pressure.  Left [facility]: Left atrial size was normal in size.  Right [facility]: Right atrial size was normal in size.  Pericardium: There is no evidence of pericardial effusion.  Mitral Valve: The mitral valve is normal in structure. Trivial mitral valve regurgitation.  Tricuspid Valve: The tricuspid valve is normal in structure. Tricuspid valve regurgitation is trivial.  Aortic Valve: The aortic valve has an indeterminant number of cusps. Aortic valve regurgitation is trivial.  Pulmonic Valve: The pulmonic valve was normal in structure. Pulmonic valve regurgitation is trivial.  Aorta: The aortic root and ascending aorta are structurally normal, with no evidence of dilitation.  Venous: The inferior vena cava is normal in size with greater than 50% respiratory variability, suggesting right atrial pressure of 3 mmHg.  IAS/Shunts: No atrial level shunt detected by color flow Doppler.    LEFT VENTRICLE PLAX 2D LVIDd:         4.60 cm   Diastology LVIDs:         3.20 cm   LV e' medial:    5.77 cm/s LV PW:         1.30 cm   LV E/e' medial:  10.8 LV IVS:        1.30 cm   LV e' lateral:   10.40 cm/s LVOT diam:     2.30 cm   LV E/e' lateral: 6.0 LV SV:         67 LV SV Index:   31 LVOT Area:     4.15 cm    RIGHT VENTRICLE RV S prime:     22.60  cm/s TAPSE (M-mode): 2.3 cm  LEFT ATRIUM             Index        RIGHT ATRIUM           Index LA diam:        3.50 cm 1.62 cm/m   RA Area:     12.00 cm LA Vol (A2C):   38.9 ml 17.99 ml/m  RA Volume:   24.10 ml  11.14 ml/m LA Vol (A4C):   31.4 ml 14.52 ml/m LA Biplane Vol: 37.2 ml 17.20 ml/m  AORTIC VALVE LVOT Vmax:   112.00 cm/s LVOT Vmean:  85.500 cm/s LVOT VTI:    0.162 m   AORTA Ao Root diam: 3.40 cm Ao Asc diam:  3.30 cm  MITRAL VALVE MV Area (PHT): 3.76 cm    SHUNTS MV Decel Time: 202 msec    Systemic VTI:  0.16 m MV E velocity: 62.30 cm/s  Systemic Diam: 2.30 cm MV A velocity: 87.60 cm/s MV E/A ratio:  0.71  Morene Brownie Electronically signed by Morene Brownie Signature Date/Time: 11/10/2023/12:39:26 PM      Final      Pertinent GI Studies   10/19/2023 screening colonoscopy at [facility] Bowel prep was not adequate.  A 3 mm sessile polyp was removed from the cecum.  A 10 mm sessile polyp was removed from the sigmoid colon.  Random biopsies taken to rule out microscopic colitis  COLON, CECUM, BIOPSY:              Tubular adenoma.  No high-grade dyplasia or malignancy identified.    B.  COLON, RANDOM, BIOPSY:  Colonic mucosa with no significant diagnostic abnormalities.               No dysplasia or malignancy identified.    C.  COLON, TRANSVERSE, BIOPSY:              Tubular adenoma.  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, Bilateral   Migraine    when I was young (08/25/2016   Posterior equinus, acquired     Bilateral   Pre-diabetes     Past Surgical History:  Procedure Laterality Date   AV FISTULA PLACEMENT Left 10/15/2016   Procedure: ARTERIOVENOUS (AV) FISTULA CREATION-LEFT ARM;  Surgeon: Laurence Redell CROME, MD;  Location: [facility] OR;  Service: Vascular;  Laterality: Left;   DECOMPRESSION HIP-CORE Left 12/07/2022   Procedure: LEFT HIP CORE DECOMPRESSION WITH ILIAC CREST BONE MARROW ASPIRATE;  Surgeon: Genelle Standing, MD;  Location: [facility] OR;  Service: Orthopedics;  Laterality: Left;   INSERTION OF DIALYSIS CATHETER Right 10/15/2016   Procedure: INSERTION OF DIALYSIS CATHETER;  Surgeon: Laurence Redell CROME, MD;  Location: [facility] OR;  Service: Vascular;  Laterality: Right;   KIDNEY TRANSPLANT  06/15/2020   [facility]   WISDOM TOOTH EXTRACTION      Family History  Problem Relation Age of Onset   Hypertension Father        Also maternal grandmother   Diabetes Father        also maternal grandmother   CVA Father    Cancer Maternal Grandfather        lung   Heart attack Maternal Grandfather     Prior to Admission medications   Medication Sig Start Date End Date Taking? Authorizing Provider  amLODipine  (NORVASC ) 10 MG tablet Take 1 tablet (10 mg total) by mouth at bedtime. 09/14/11 05/13/23  Danton Reyes DASEN, MD  carvedilol  (COREG ) 25 MG tablet Take 1 tablet (25 mg total) by mouth 2 (two) times daily with a meal. 09/14/11 05/13/23  Danton Reyes DASEN, MD  chlorproMAZINE  (THORAZINE ) 10 MG tablet Take 1 tablet (10 mg total) by mouth 2 (two) times daily as needed. 11/07/23   Midge Golas, MD  cinacalcet  (SENSIPAR ) 30 MG tablet Take 30 mg by mouth daily.    [provider]  cloNIDine  (CATAPRES ) 0.3 MG tablet Take 1 tablet (0.3 mg total) by mouth 3 (three) times daily. Patient taking differently: Take 0.3 mg by mouth 2 (two) times daily. 08/27/16   Amin, Sumayya, MD  fenofibrate (TRICOR) 145 MG tablet Take 145 mg by mouth daily.    [provider]  icosapent  Ethyl (VASCEPA ) 1 g  capsule Take 2 g by mouth 2 (two) times daily.    [provider]  insulin  degludec (TRESIBA ) 200 UNIT/ML FlexTouch Pen Inject 30 Units into the skin at bedtime. 09/23/21   [provider]  insulin  lispro (HUMALOG KWIKPEN) 200 UNIT/ML KwikPen Inject 25-40 Units into the skin 3 (three) times daily as needed (high blood sugar). 09/02/20  [provider]  MOUNJARO 15 MG/0.5ML Pen Inject 15 mg into the skin once a week.    [provider]  mycophenolate  (CELLCEPT ) 500 MG tablet Take 1,000 mg by mouth 2 (two) times daily.    [provider]  predniSONE  (DELTASONE ) 5 MG tablet Take 5 mg by mouth daily.    [provider]  sodium bicarbonate  650 MG tablet Take 2 tablets (1,300 mg total) by mouth 3 (three) times daily. 05/18/23   Vann, Jessica U, DO  sulfamethoxazole -trimethoprim  (BACTRIM ) 400-80 MG tablet Take 1 tablet by mouth every Monday, Wednesday, and Friday.    [provider]  tacrolimus  (PROGRAF ) 1 MG capsule Take 2-3 mg by mouth See admin instructions. Take 3 capsules (3mg ) in the morning and then take 2 capsules (2mg ) at bedtime.    [provider]  tadalafil (CIALIS) 20 MG tablet Take 20 mg by mouth daily as needed for erectile dysfunction.    [provider]  traZODone  (DESYREL ) 50 MG tablet Take 50 mg by mouth at bedtime.    [provider]  Vitamin D, Ergocalciferol, (DRISDOL) 1.25 MG (50000 UNIT) CAPS capsule Take 50,000 Units by mouth every 7 (seven) days. 03/02/23   [provider]    Current Facility-Administered Medications  Medication Dose Route Frequency Provider Last Rate Last Admin   0.9 %  sodium chloride  infusion   Intravenous Continuous Alto Isaiah CROME, NP   Stopped at 11/10/23 1128   acetaminophen  (TYLENOL ) tablet 650 mg  650 mg Oral Q6H PRN Sundil, Subrina, MD       baclofen  (LIORESAL ) tablet 5 mg  5 mg Oral BID Alto Isaiah CROME, NP   5 mg at 11/10/23 1109   cinacalcet  (SENSIPAR )  tablet 30 mg  30 mg Oral Daily Alto Isaiah CROME, NP       heparin  injection 5,000 Units  5,000 Units Subcutaneous Q8H Alto Isaiah CROME, NP   5,000 Units at 11/10/23 1323   icosapent  Ethyl (VASCEPA ) 1 g capsule 2 g  2 g Oral BID Garrick Leontine SAILOR, PA-C   2 g at 11/10/23 1406   insulin  aspart (novoLOG ) injection 0-5 Units  0-5 Units Subcutaneous QHS Sundil, Subrina, MD       insulin  aspart (novoLOG ) injection 0-6 Units  0-6 Units Subcutaneous TID WC Sundil, Subrina, MD       insulin  degludec (TRESIBA ) 200 UNIT/ML FlexTouch Pen SOPN 30 Units  30 Units Subcutaneous QHS Alto Isaiah CROME, NP       metoprolol  succinate (TOPROL -XL) 24 hr tablet 25 mg  25 mg Oral QPM Ladona Heinz, MD       mycophenolate  (CELLCEPT ) tablet 1,000 mg  1,000 mg Oral BID Alto Isaiah CROME, NP       ondansetron  (ZOFRAN ) injection 4 mg  4 mg Intravenous Q8H Regalado, Belkys A, MD   4 mg at 11/10/23 1323   pantoprazole  (PROTONIX ) injection 40 mg  40 mg Intravenous Q12H Regalado, Belkys A, MD   40 mg at 11/10/23 1008   predniSONE  (DELTASONE ) tablet 5 mg  5 mg Oral Daily Alto Isaiah CROME, NP       promethazine  (PHENERGAN ) 25 mg in sodium chloride  0.9 % 50 mL IVPB  25 mg Intravenous Q8H PRN Sundil, Subrina, MD 150 mL/hr at 11/10/23 1128 25 mg at 11/10/23 1128   sodium bicarbonate  tablet 1,300 mg  1,300 mg Oral TID Alto Isaiah CROME, NP       sodium chloride  flush (NS) 0.9 % injection 3 mL  3 mL Intravenous Q12H Alto Isaiah CROME, NP       sulfamethoxazole -trimethoprim  (BACTRIM ) 400-80 MG per tablet 1 tablet  1 tablet Oral Q M,W,F Alto Isaiah CROME, NP       tacrolimus  (PROGRAF ) capsule 2-3 mg  2-3 mg Oral See admin instructions Alto Isaiah CROME, NP       traZODone  (DESYREL ) tablet 50 mg  50 mg Oral QHS Alto Isaiah CROME, NP        Allergies as of 11/09/2023   (No Known Allergies)    Social History   Socioeconomic History   Marital status: Married    Spouse name: Not on file   Number of children: 1   Years of education: BA    Highest education level: Not on file  Occupational History   Not on file  Tobacco Use   Smoking status: Never   Smokeless tobacco: Never  Vaping Use   Vaping status: Never Used  Substance and Sexual Activity   Alcohol use: No    Comment: rare   Drug use: No    Comment: formerly used Marijuana   Sexual activity: Not on file  Other Topics Concern   Not on file  Social History Narrative   Drinks tea 3-5 times a week    Social Drivers of Health   Financial Resource Strain: Not on file  Food Insecurity: No Food Insecurity (11/10/2023)   Hunger Vital Sign    Worried About Running Out of Food in the Last Year: Never true    Ran Out of Food in the Last Year: Never true  Transportation Needs: No Transportation Needs (11/10/2023)   PRAPARE - Administrator, Civil Service (Medical): No    Lack of Transportation (Non-Medical): No  Physical Activity: Not on file  Stress: Not on file  Social Connections: Not on file  Intimate Partner Violence: Not At Risk (11/10/2023)   Humiliation, Afraid, Rape, and Kick questionnaire    Fear of Current or Ex-Partner: No    Emotionally Abused: No    Physically Abused: No    Sexually Abused: No     Code Status   Code Status: Full Code  Review of Systems: All systems reviewed and negative except where noted in HPI.  Physical Exam: Vital signs in last 24 hours: Temp:  [98.1 F (36.7 C)-98.7 F (37.1 C)] 98.7 F (37.1 C) (07/23 0739) Pulse Rate:  [75-120] 99 (07/23 0739) Resp:  [13-23] 18 (07/23 0238) BP: (114-155)/(68-106) 124/77 (07/23 0739) SpO2:  [92 %-100 %] 98 % (07/23 0739) Weight:  [97.9 kg-98.6 kg] 98.6 kg (07/23 0235)    General:  Pleasant male in NAD Psych:  Cooperative. Normal mood and affect Eyes: Pupils equal Ears:  Normal auditory acuity Nose: No deformity, discharge or lesions Neck:  Supple, no masses felt Lungs:  Clear to auscultation.  Heart:  Regular rate, regular rhythm.  Abdomen:  Soft, nondistended,  nontender, active bowel sounds, no masses felt Rectal :  Deferred Msk: Symmetrical without gross deformities.  Neurologic:  Alert, oriented, grossly normal neurologically Extremities : No edema Skin:  Intact without significant lesions.    Intake/Output from previous day: 07/22 0701 - 07/23 0700 In: 47.9 [IV Piggyback:47.9] Out: -  Intake/Output this shift:  Total I/O In: 258.3 [P.O.:240; I.V.:18.3] Out: -    Vina Dasen, NP-C   11/10/2023, 2:18 PM

## 2023-11-10 NOTE — Consult Note (Signed)
 Renal Service Consult Note [facility]  Barry Nichols 11/10/2023 Barry JONETTA Fret, MD Requesting Physician: Isaiah Lever, NP  Reason for Consult: ESRD pt w/ nausea, vomiting, hiccups HPI: The patient is a 48 y.o. year-old w/ PMH as below who presented to ED yesterday c/o nausea, vomiting, hiccups. Has been a recurring issue. 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 Felt dehydrated. In ED BP was 136/ 87, HR 103, RR 18, temp 98, K+ 4.1, BUN 53, creat 12.7, alb 3.3, hb 7.6, wbc 6K.  Pt rec'd IV zofran , phenergan , benadryl  and thorazine . Also some po asa and baclofen . IVF's started at 75 cc/hr and pt was admitted. We are asked to see for dialysis.   Pt seen in hospital room. Main issue is the nausea and the hiccups. Denies any vol excess, edema or SOB.   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, Bilateral   Migraine    when I was young (08/25/2016   Posterior equinus, acquired    Bilateral   Pre-diabetes    Past Surgical History  Past Surgical History:  Procedure Laterality Date   AV FISTULA PLACEMENT Left 10/15/2016   Procedure: ARTERIOVENOUS (AV) FISTULA CREATION-LEFT ARM;  Surgeon: Laurence Redell CROME, MD;  Location: [facility] OR;  Service: Vascular;  Laterality: Left;   DECOMPRESSION HIP-CORE Left 12/07/2022   Procedure: LEFT HIP CORE DECOMPRESSION WITH ILIAC CREST BONE MARROW ASPIRATE;  Surgeon: Genelle Standing, MD;  Location: [facility] OR;  Service: Orthopedics;  Laterality: Left;   INSERTION OF DIALYSIS CATHETER Right 10/15/2016   Procedure: INSERTION OF DIALYSIS CATHETER;  Surgeon: Laurence Redell CROME, MD;  Location: [facility] OR;  Service: Vascular;  Laterality: Right;   KIDNEY TRANSPLANT  06/15/2020   [facility]   WISDOM TOOTH EXTRACTION     Family History  Family History  Problem Relation Age of Onset   Hypertension Father        Also maternal grandmother   Diabetes Father        also maternal grandmother   CVA Father    Cancer Maternal Grandfather        lung   Heart attack Maternal Grandfather    Social History  reports that he has never smoked. He has never used smokeless tobacco. He reports that he does not drink alcohol and does not use drugs. Allergies No Known Allergies Home medications Prior to Admission medications   Medication Sig Start Date End Date Taking? Authorizing Provider  amLODipine  (NORVASC ) 10 MG tablet Take 1 tablet (10 mg total) by mouth at bedtime. 09/14/11 05/13/23  Danton Reyes DASEN, MD  carvedilol  (COREG ) 25 MG tablet Take 1 tablet (25 mg total) by mouth 2 (two) times daily with a meal. 09/14/11 05/13/23  Danton Reyes DASEN, MD  chlorproMAZINE  (THORAZINE ) 10  MG tablet Take 1 tablet (10 mg total) by mouth 2 (two) times daily as needed. 11/07/23   Midge Golas, MD  cinacalcet  (SENSIPAR ) 30 MG tablet Take 30 mg by mouth daily.    [provider]  cloNIDine  (CATAPRES ) 0.3 MG tablet Take 1 tablet (0.3 mg total) by mouth 3 (three) times daily. Patient taking differently: Take 0.3 mg by mouth 2 (two) times daily. 08/27/16   Amin, Sumayya, MD  fenofibrate (TRICOR) 145 MG tablet Take 145 mg by mouth daily.    [provider]  icosapent  Ethyl (VASCEPA ) 1 g capsule Take 2 g by mouth 2 (two) times daily.    [provider]  insulin  degludec (TRESIBA ) 200 UNIT/ML FlexTouch Pen Inject 30 Units into the skin at bedtime. 09/23/21   [provider]  insulin  lispro (HUMALOG KWIKPEN) 200 UNIT/ML KwikPen Inject 25-40 Units into the skin 3 (three) times daily as needed (high blood sugar). 09/02/20   [provider]  MOUNJARO 15 MG/0.5ML Pen Inject 15 mg into the skin once a week.    [provider]  mycophenolate  (CELLCEPT ) 500 MG tablet Take 1,000 mg by mouth 2 (two) times daily.    [provider]  predniSONE  (DELTASONE ) 5 MG tablet Take 5 mg by mouth daily.    [provider]  sodium bicarbonate  650 MG tablet Take 2 tablets (1,300 mg total) by mouth 3 (three) times daily. 05/18/23   Vann, Jessica U, DO  sulfamethoxazole -trimethoprim  (BACTRIM ) 400-80 MG tablet Take 1 tablet by mouth every Monday, Wednesday, and Friday.    [provider]  tacrolimus  (PROGRAF ) 1 MG capsule Take 2-3 mg by mouth See admin instructions. Take 3 capsules (3mg ) in the morning and then take 2 capsules (2mg ) at bedtime.    [provider]  tadalafil (CIALIS) 20 MG tablet Take 20 mg by mouth daily as needed for erectile dysfunction.    [provider]  traZODone  (DESYREL ) 50 MG tablet Take 50 mg by mouth at bedtime.    [provider]  Vitamin D, Ergocalciferol, (DRISDOL) 1.25 MG (50000 UNIT) CAPS capsule Take 50,000 Units by mouth every 7 (seven) days. 03/02/23   [provider]     Vitals:   11/10/23 0145 11/10/23 0235 11/10/23 0238 11/10/23 0739  BP: 125/75  (!) 135/92 124/77  Pulse: 100 94 (!) 102   Resp: 16 13 18    Temp:   98.1 F (36.7 C) 98.7 F (37.1 C)  TempSrc:   Oral Oral  SpO2: 92% 97% 100%   Weight:  98.6 kg    Height:  5' 10 (1.778 m)     Exam Gen alert, no distress, on RA No rash, cyanosis or gangrene Sclera anicteric, throat clear  No jvd or bruits Chest clear bilat to bases, no rales/ wheezing RRR no MRG Abd soft ntnd no mass or ascites +bs GU deferred MS no joint effusions or deformity Ext no LE or UE edema, no other edema Neuro is alert,  Ox 3 , nf    LFA AVF+bruit   Home bp meds: Coreg  25 bid Norvasc  10 hs Clonidine  0.3mg  bid Others: sensipar , tricor, insulins, mounjaro, cellcept , prednisone , sof bicarb, prograf  3 am+ 2pm, trazodone     OP HD: [facility]WF [facility] 4h  B400  97.5kg  2K bath  AVF   Heparin  2000 Last OP HD 7/21, post wt 97.2kg Very low wt gains, < 1 kg  CXR 7/22 - no active disease BP 130/90, HR 100, RR 18,  afeb Na 135  K 4.1   BUN 53  creat 12.7   lipase 211 tbili 0.5   alb 3.3 Wbc 6K   Hb 7.6    Assessment/ Plan: Hiccups/ nausea/ vomiting: per pmd ESRD: on HD MWF. Last HD Monday. Plan HD tonight.  HTN: bp's stable here, cont home meds as needed.  Volume: no vol excess on exam, coming off just under dry wt recently. Will prob need dry wt lowered.  Anemia of esrd: Hb 7- 9 here, follow, transfuse prn.  IDDM     Myer Fret  MD [facility] 11/10/2023, 9:58 AM  Recent Labs  Lab 11/06/23 2309 11/09/23 1813  HGB 8.1* 7.6*  ALBUMIN 3.4* 3.3*  CALCIUM 9.7 9.7  CREATININE 12.90* 12.70*  K 4.7 4.1   Inpatient medications:  insulin  aspart  0-5 Units Subcutaneous QHS   insulin  aspart  0-6 Units Subcutaneous TID WC   ondansetron  (ZOFRAN ) IV  4 mg Intravenous Q8H   pantoprazole  (PROTONIX ) IV  40 mg Intravenous Q12H    promethazine  (PHENERGAN ) injection (IM or IVPB)     acetaminophen , chlorproMAZINE  (THORAZINE ) injection, promethazine  (PHENERGAN ) injection (IM or IVPB)

## 2023-11-10 NOTE — H&P (View-Only) (Signed)
 Consultation Note   Referring Provider:  [facility] PCP: [name], PA-C Primary Gastroenterologist:   [facility]      Reason for Consultation: Hiccups, vomiting DOA: 11/09/2023         Hospital Day: 2   ASSESSMENT     48 y.o. 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  Reflux related? Related to uremia? Doubt phrenic nerve pathology or more ominous causes  Black stool  Two black BMs this week. Hgb stable. Rule out PUD, erosive disease, AVMs.   Elevated lipase ( 477) on admission Lipase 211 yesterday.  Clinically does not appear to have pancreatitis.  No evidence for pancreatitis on noncontrast CT scan.  Possibly decreased clearance of lipase in the setting of renal disease?  Chronic anemia Probably anemia or chronic disease. Does have folate deficiency. No evidence for iron deficiency  History of adenomatous colon polyps 2 tubular adenomas removed at time of colonoscopy on 10/19/2023 at [facility]  Mildly elevated liver enzymes Hepatic steatosis Liver enzymes less than 3 times the upper limits of normal.  Probably okay to continue fenofibrate if drug beneficial from cardiac standpoint and also may help in treatment of hepatic steatosis  Elevated troponin Cardiology has evaluated and elevated serum troponin nonspecific.  No indication for evaluation / follow-up  Folate Deficiency Cardiology has recommended discontinuation of fenofibrate in the setting of elevated transaminases.    Family history of pancreatic cancer Mother diagnosed with pancreatic cancer last month  See PMH for additional history  Principal Problem:   Intractable nausea and  vomiting Active Problems:   Abnormal cardiac enzyme level   Orthostatic hypotension   Mixed hypercholesterolemia and hypertriglyceridemia   End-stage renal disease on hemodialysis (HCC)      PLAN:   --Continue twice daily IV PPI --Folate repletion per primary team --Schedule for EGD. The risks and benefits of EGD with possible biopsies were discussed with the patient who agrees to proceed.  -- Outpatient follow-up with [facility] regarding colon polyps /inadequate bowel prep on recent colonoscopy   HPI   48 y.o. 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  He has had a several pound weight loss associated with symptoms .  Denies nausea.  Really describes more of vomiting due to hiccups .  No abdominal pain or chest pain .  He does give a history of intermittent heartburn over the last couple years but has not been treated .  Recently seen several times in the ED for the same symptoms . Prescribed Thorazine  which provided only only transient relief.  In the ED his lipase was elevated in the 400s, liver enzymes mildly elevated.  Total bilirubin and alkaline phosphatase normal. No evidence for pancreatitis  or hepatobiliary abnormalities on noncontrast CT scan.  [name] is a non-smoker.  No alcohol use.   [name] recently went for screening colonoscopy at [facility].  2 adenomatous colon polyps were removed.  His bowel prep was not adequate.   Labs and Imaging:  Recent Labs    11/09/23 1813  PROT 8.0  ALBUMIN 3.3*  AST 66*  ALT 80*  ALKPHOS 96  BILITOT 0.5   Recent Labs    11/09/23 1813  WBC 6.8  HGB 7.6*  HCT 23.4*  MCV 87.3  PLT 324   Recent Labs     11/09/23 1813  NA 135  K 4.1  CL 89*  CO2 26  GLUCOSE 117*  BUN 53*  CREATININE 12.70*  CALCIUM 9.7     ECHOCARDIOGRAM COMPLETE    ECHOCARDIOGRAM REPORT       Patient Name:   Barry Nichols Date of Exam: 11/10/2023 Medical Rec #:  [id number]           Height:       70.0 in Accession #:    [id number]          Weight:       217.4 lb Date of Birth:  27-Mar-1976           BSA:          2.163 m Patient Age:    48 years            BP:           132/89 mmHg Patient Gender: M                   HR:           105 bpm. Exam Location:  Inpatient  Procedure: 2D Echo, Cardiac Doppler and Color Doppler (Both Spectral and Color            Flow Doppler were utilized during procedure).  Indications:    Elevated Troponins   History:        Patient has no prior history of Echocardiogram examinations.                 ESRD; Risk Factors:Hypertension and Diabetes.   Sonographer:    Damien Senior RDCS Referring Phys: SUBRINA SUNDIL  IMPRESSIONS   1. There is mid cavity acceleration of up to , unable to augment with Valsalva. Left ventricular ejection fraction, by estimation, is 65 to 70%. The left ventricle has normal function. The left ventricle has no regional wall motion abnormalities.  There is mild concentric left ventricular hypertrophy. Left ventricular diastolic parameters are consistent with Grade I diastolic dysfunction (impaired relaxation).  2. Right ventricular systolic function is normal. The right ventricular size is normal. Tricuspid regurgitation signal is inadequate for assessing PA pressure.  3. The mitral valve is normal in structure. Trivial mitral valve regurgitation.  4. The aortic valve has an indeterminant number of cusps. Aortic valve regurgitation is trivial.  5. The inferior vena cava is normal in size with greater than 50% respiratory variability, suggesting right atrial pressure of 3 mmHg.  FINDINGS  Left Ventricle: There is mid cavity acceleration of up to  , unable to augment with Valsalva. Left ventricular ejection fraction, by estimation, is 65 to 70%. The left ventricle has normal function. The left ventricle has no regional wall motion  abnormalities. The left ventricular internal cavity size was normal in size. There is mild concentric left ventricular hypertrophy. Left ventricular diastolic parameters are consistent  with Grade I diastolic dysfunction (impaired relaxation).  Right Ventricle: The right ventricular size is normal. No increase in right ventricular wall thickness. Right ventricular systolic function is normal. Tricuspid regurgitation signal is inadequate for assessing PA pressure.  Left [facility]: Left atrial size was normal in size.  Right [facility]: Right atrial size was normal in size.  Pericardium: There is no evidence of pericardial effusion.  Mitral Valve: The mitral valve is normal in structure. Trivial mitral valve regurgitation.  Tricuspid Valve: The tricuspid valve is normal in structure. Tricuspid valve regurgitation is trivial.  Aortic Valve: The aortic valve has an indeterminant number of cusps. Aortic valve regurgitation is trivial.  Pulmonic Valve: The pulmonic valve was normal in structure. Pulmonic valve regurgitation is trivial.  Aorta: The aortic root and ascending aorta are structurally normal, with no evidence of dilitation.  Venous: The inferior vena cava is normal in size with greater than 50% respiratory variability, suggesting right atrial pressure of 3 mmHg.  IAS/Shunts: No atrial level shunt detected by color flow Doppler.    LEFT VENTRICLE PLAX 2D LVIDd:         4.60 cm   Diastology LVIDs:         3.20 cm   LV e' medial:    5.77 cm/s LV PW:         1.30 cm   LV E/e' medial:  10.8 LV IVS:        1.30 cm   LV e' lateral:   10.40 cm/s LVOT diam:     2.30 cm   LV E/e' lateral: 6.0 LV SV:         67 LV SV Index:   31 LVOT Area:     4.15 cm    RIGHT VENTRICLE RV S prime:     22.60  cm/s TAPSE (M-mode): 2.3 cm  LEFT ATRIUM             Index        RIGHT ATRIUM           Index LA diam:        3.50 cm 1.62 cm/m   RA Area:     12.00 cm LA Vol (A2C):   38.9 ml 17.99 ml/m  RA Volume:   24.10 ml  11.14 ml/m LA Vol (A4C):   31.4 ml 14.52 ml/m LA Biplane Vol: 37.2 ml 17.20 ml/m  AORTIC VALVE LVOT Vmax:   112.00 cm/s LVOT Vmean:  85.500 cm/s LVOT VTI:    0.162 m   AORTA Ao Root diam: 3.40 cm Ao Asc diam:  3.30 cm  MITRAL VALVE MV Area (PHT): 3.76 cm    SHUNTS MV Decel Time: 202 msec    Systemic VTI:  0.16 m MV E velocity: 62.30 cm/s  Systemic Diam: 2.30 cm MV A velocity: 87.60 cm/s MV E/A ratio:  0.71  Morene Brownie Electronically signed by Morene Brownie Signature Date/Time: 11/10/2023/12:39:26 PM      Final      Pertinent GI Studies   10/19/2023 screening colonoscopy at [facility] Bowel prep was not adequate.  A 3 mm sessile polyp was removed from the cecum.  A 10 mm sessile polyp was removed from the sigmoid colon.  Random biopsies taken to rule out microscopic colitis  COLON, CECUM, BIOPSY:              Tubular adenoma.  No high-grade dyplasia or malignancy identified.    B.  COLON, RANDOM, BIOPSY:  Colonic mucosa with no significant diagnostic abnormalities.               No dysplasia or malignancy identified.    C.  COLON, TRANSVERSE, BIOPSY:              Tubular adenoma.  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, Bilateral   Migraine    when I was young (08/25/2016   Posterior equinus, acquired     Bilateral   Pre-diabetes     Past Surgical History:  Procedure Laterality Date   AV FISTULA PLACEMENT Left 10/15/2016   Procedure: ARTERIOVENOUS (AV) FISTULA CREATION-LEFT ARM;  Surgeon: Laurence Redell CROME, MD;  Location: [facility] OR;  Service: Vascular;  Laterality: Left;   DECOMPRESSION HIP-CORE Left 12/07/2022   Procedure: LEFT HIP CORE DECOMPRESSION WITH ILIAC CREST BONE MARROW ASPIRATE;  Surgeon: Genelle Standing, MD;  Location: [facility] OR;  Service: Orthopedics;  Laterality: Left;   INSERTION OF DIALYSIS CATHETER Right 10/15/2016   Procedure: INSERTION OF DIALYSIS CATHETER;  Surgeon: Laurence Redell CROME, MD;  Location: [facility] OR;  Service: Vascular;  Laterality: Right;   KIDNEY TRANSPLANT  06/15/2020   [facility]   WISDOM TOOTH EXTRACTION      Family History  Problem Relation Age of Onset   Hypertension Father        Also maternal grandmother   Diabetes Father        also maternal grandmother   CVA Father    Cancer Maternal Grandfather        lung   Heart attack Maternal Grandfather     Prior to Admission medications   Medication Sig Start Date End Date Taking? Authorizing Provider  amLODipine  (NORVASC ) 10 MG tablet Take 1 tablet (10 mg total) by mouth at bedtime. 09/14/11 05/13/23  Danton Reyes DASEN, MD  carvedilol  (COREG ) 25 MG tablet Take 1 tablet (25 mg total) by mouth 2 (two) times daily with a meal. 09/14/11 05/13/23  Danton Reyes DASEN, MD  chlorproMAZINE  (THORAZINE ) 10 MG tablet Take 1 tablet (10 mg total) by mouth 2 (two) times daily as needed. 11/07/23   Midge Golas, MD  cinacalcet  (SENSIPAR ) 30 MG tablet Take 30 mg by mouth daily.    [provider]  cloNIDine  (CATAPRES ) 0.3 MG tablet Take 1 tablet (0.3 mg total) by mouth 3 (three) times daily. Patient taking differently: Take 0.3 mg by mouth 2 (two) times daily. 08/27/16   Amin, Sumayya, MD  fenofibrate (TRICOR) 145 MG tablet Take 145 mg by mouth daily.    [provider]  icosapent  Ethyl (VASCEPA ) 1 g  capsule Take 2 g by mouth 2 (two) times daily.    [provider]  insulin  degludec (TRESIBA ) 200 UNIT/ML FlexTouch Pen Inject 30 Units into the skin at bedtime. 09/23/21   [provider]  insulin  lispro (HUMALOG KWIKPEN) 200 UNIT/ML KwikPen Inject 25-40 Units into the skin 3 (three) times daily as needed (high blood sugar). 09/02/20  [provider]  MOUNJARO 15 MG/0.5ML Pen Inject 15 mg into the skin once a week.    [provider]  mycophenolate  (CELLCEPT ) 500 MG tablet Take 1,000 mg by mouth 2 (two) times daily.    [provider]  predniSONE  (DELTASONE ) 5 MG tablet Take 5 mg by mouth daily.    [provider]  sodium bicarbonate  650 MG tablet Take 2 tablets (1,300 mg total) by mouth 3 (three) times daily. 05/18/23   Vann, Jessica U, DO  sulfamethoxazole -trimethoprim  (BACTRIM ) 400-80 MG tablet Take 1 tablet by mouth every Monday, Wednesday, and Friday.    [provider]  tacrolimus  (PROGRAF ) 1 MG capsule Take 2-3 mg by mouth See admin instructions. Take 3 capsules (3mg ) in the morning and then take 2 capsules (2mg ) at bedtime.    [provider]  tadalafil (CIALIS) 20 MG tablet Take 20 mg by mouth daily as needed for erectile dysfunction.    [provider]  traZODone  (DESYREL ) 50 MG tablet Take 50 mg by mouth at bedtime.    [provider]  Vitamin D, Ergocalciferol, (DRISDOL) 1.25 MG (50000 UNIT) CAPS capsule Take 50,000 Units by mouth every 7 (seven) days. 03/02/23   [provider]    Current Facility-Administered Medications  Medication Dose Route Frequency Provider Last Rate Last Admin   0.9 %  sodium chloride  infusion   Intravenous Continuous Alto Isaiah CROME, NP   Stopped at 11/10/23 1128   acetaminophen  (TYLENOL ) tablet 650 mg  650 mg Oral Q6H PRN Sundil, Subrina, MD       baclofen  (LIORESAL ) tablet 5 mg  5 mg Oral BID Alto Isaiah CROME, NP   5 mg at 11/10/23 1109   cinacalcet  (SENSIPAR )  tablet 30 mg  30 mg Oral Daily Alto Isaiah CROME, NP       heparin  injection 5,000 Units  5,000 Units Subcutaneous Q8H Alto Isaiah CROME, NP   5,000 Units at 11/10/23 1323   icosapent  Ethyl (VASCEPA ) 1 g capsule 2 g  2 g Oral BID Garrick Leontine SAILOR, PA-C   2 g at 11/10/23 1406   insulin  aspart (novoLOG ) injection 0-5 Units  0-5 Units Subcutaneous QHS Sundil, Subrina, MD       insulin  aspart (novoLOG ) injection 0-6 Units  0-6 Units Subcutaneous TID WC Sundil, Subrina, MD       insulin  degludec (TRESIBA ) 200 UNIT/ML FlexTouch Pen SOPN 30 Units  30 Units Subcutaneous QHS Alto Isaiah CROME, NP       metoprolol  succinate (TOPROL -XL) 24 hr tablet 25 mg  25 mg Oral QPM Ladona Heinz, MD       mycophenolate  (CELLCEPT ) tablet 1,000 mg  1,000 mg Oral BID Alto Isaiah CROME, NP       ondansetron  (ZOFRAN ) injection 4 mg  4 mg Intravenous Q8H Regalado, Belkys A, MD   4 mg at 11/10/23 1323   pantoprazole  (PROTONIX ) injection 40 mg  40 mg Intravenous Q12H Regalado, Belkys A, MD   40 mg at 11/10/23 1008   predniSONE  (DELTASONE ) tablet 5 mg  5 mg Oral Daily Alto Isaiah CROME, NP       promethazine  (PHENERGAN ) 25 mg in sodium chloride  0.9 % 50 mL IVPB  25 mg Intravenous Q8H PRN Sundil, Subrina, MD 150 mL/hr at 11/10/23 1128 25 mg at 11/10/23 1128   sodium bicarbonate  tablet 1,300 mg  1,300 mg Oral TID Alto Isaiah CROME, NP       sodium chloride  flush (NS) 0.9 % injection 3 mL  3 mL Intravenous Q12H Alto Isaiah CROME, NP       sulfamethoxazole -trimethoprim  (BACTRIM ) 400-80 MG per tablet 1 tablet  1 tablet Oral Q M,W,F Alto Isaiah CROME, NP       tacrolimus  (PROGRAF ) capsule 2-3 mg  2-3 mg Oral See admin instructions Alto Isaiah CROME, NP       traZODone  (DESYREL ) tablet 50 mg  50 mg Oral QHS Alto Isaiah CROME, NP        Allergies as of 11/09/2023   (No Known Allergies)    Social History   Socioeconomic History   Marital status: Married    Spouse name: Not on file   Number of children: 1   Years of education: BA    Highest education level: Not on file  Occupational History   Not on file  Tobacco Use   Smoking status: Never   Smokeless tobacco: Never  Vaping Use   Vaping status: Never Used  Substance and Sexual Activity   Alcohol use: No    Comment: rare   Drug use: No    Comment: formerly used Marijuana   Sexual activity: Not on file  Other Topics Concern   Not on file  Social History Narrative   Drinks tea 3-5 times a week    Social Drivers of Health   Financial Resource Strain: Not on file  Food Insecurity: No Food Insecurity (11/10/2023)   Hunger Vital Sign    Worried About Running Out of Food in the Last Year: Never true    Ran Out of Food in the Last Year: Never true  Transportation Needs: No Transportation Needs (11/10/2023)   PRAPARE - Administrator, Civil Service (Medical): No    Lack of Transportation (Non-Medical): No  Physical Activity: Not on file  Stress: Not on file  Social Connections: Not on file  Intimate Partner Violence: Not At Risk (11/10/2023)   Humiliation, Afraid, Rape, and Kick questionnaire    Fear of Current or Ex-Partner: No    Emotionally Abused: No    Physically Abused: No    Sexually Abused: No     Code Status   Code Status: Full Code  Review of Systems: All systems reviewed and negative except where noted in HPI.  Physical Exam: Vital signs in last 24 hours: Temp:  [98.1 F (36.7 C)-98.7 F (37.1 C)] 98.7 F (37.1 C) (07/23 0739) Pulse Rate:  [75-120] 99 (07/23 0739) Resp:  [13-23] 18 (07/23 0238) BP: (114-155)/(68-106) 124/77 (07/23 0739) SpO2:  [92 %-100 %] 98 % (07/23 0739) Weight:  [97.9 kg-98.6 kg] 98.6 kg (07/23 0235)    General:  Pleasant male in NAD Psych:  Cooperative. Normal mood and affect Eyes: Pupils equal Ears:  Normal auditory acuity Nose: No deformity, discharge or lesions Neck:  Supple, no masses felt Lungs:  Clear to auscultation.  Heart:  Regular rate, regular rhythm.  Abdomen:  Soft, nondistended,  nontender, active bowel sounds, no masses felt Rectal :  Deferred Msk: Symmetrical without gross deformities.  Neurologic:  Alert, oriented, grossly normal neurologically Extremities : No edema Skin:  Intact without significant lesions.    Intake/Output from previous day: 07/22 0701 - 07/23 0700 In: 47.9 [IV Piggyback:47.9] Out: -  Intake/Output this shift:  Total I/O In: 258.3 [P.O.:240; I.V.:18.3] Out: -    Vina Dasen, NP-C   11/10/2023, 2:18 PM  0739) SpO2:  [92 %-100 %] 98 % (07/23 0739) Weight:  [97.9 kg-98.6 kg] 98.6 kg (07/23 0235)    General:  Pleasant male in NAD Psych:  Cooperative. Normal mood and affect Eyes: Pupils equal Ears:  Normal auditory acuity Nose: No deformity, discharge or lesions Neck:  Supple, no masses felt Lungs:  Clear to auscultation.  Heart:  Regular rate, regular rhythm.  Abdomen:  Soft, nondistended, nontender, active bowel sounds, no masses felt Rectal :  Deferred Msk: Symmetrical without gross deformities.  Neurologic:  Alert, oriented, grossly normal neurologically Extremities : No edema Skin:  Intact without significant lesions.    Intake/Output from previous day: 07/22 0701 - 07/23 0700 In: 47.9 [IV Piggyback:47.9] Out: -  Intake/Output this shift:  Total I/O In: 258.3 [P.O.:240; I.V.:18.3] Out: -    Vina Dasen, NP-C   11/10/2023, 2:18 PM

## 2023-11-10 NOTE — H&P (Addendum)
 History and Physical    Patient: Barry Nichols FMW:[id number] DOB: 10/11/1975 DOA: 11/09/2023 DOS: the patient was seen and examined on 11/10/2023 PCP: [name], PA-C  Patient coming from: Home  Chief Complaint:  Chief Complaint  Patient presents with   Hiccups   Emesis   HPI: Barry Nichols is a 48 y.o. 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  Presented to the ED overnight after reporting continuous hiccups and vomiting since 7/17.  He states these hiccups began after he underwent a colonoscopy at [facility].  He primarily was frightened because with back-to-back severe hiccups he was having trouble breathing.  He does have a history of renal transplant but with dehydration and norovirus the transplant failed.  In the ED he was afebrile, at times hypertensive, sats were stable on room air at 100%.  Labs essentially unremarkable except for mild elevated anion gap and the patient on dialysis, subtle elevation in LFTs in context of poor oral intake.  These have been trending up since the 17th.  WBC and platelets were normal.  Hemoglobin slightly low at 7.6 is within parameters for this patient.  Chest x-ray was unremarkable.  Troponin T slightly elevated at 266 and 247.  Lactic acid 1.7.  Hospitalist service consulted to evaluate the patient for admission.   Review of Systems: As mentioned in the history of present illness. All other systems reviewed and are negative.  He states he is very hungry and wants to try to eat.  He was able to drink a cranberry juice and some ginger ale during my encounter with him and he did not have any emesis despite recurrent hiccups.  He only has chest discomfort while having hiccups.  He complains that his abdominal muscles are sore from all of the hiccuping and vomiting.  He reports that he feels he is dehydrated.  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, Bilateral   Migraine    when I was young (08/25/2016   Posterior equinus, acquired    Bilateral   Pre-diabetes    Past Surgical History:  Procedure Laterality Date   AV FISTULA PLACEMENT Left 10/15/2016   Procedure: ARTERIOVENOUS (AV) FISTULA CREATION-LEFT ARM;  Surgeon: Laurence Redell CROME, MD;  Location: [facility] OR;  Service: Vascular;  Laterality: Left;   DECOMPRESSION HIP-CORE Left 12/07/2022   Procedure: LEFT HIP CORE DECOMPRESSION WITH ILIAC CREST BONE MARROW ASPIRATE;  Surgeon: Genelle Standing, MD;  Location: [facility] OR;  Service: Orthopedics;  Laterality: Left;   INSERTION OF DIALYSIS CATHETER Right 10/15/2016   Procedure: INSERTION OF DIALYSIS CATHETER;  Surgeon: Laurence Redell CROME, MD;  Location: [facility] OR;  Service: Vascular;  Laterality: Right;   KIDNEY TRANSPLANT  06/15/2020   [facility]   WISDOM TOOTH EXTRACTION     Social History:  reports that he has never smoked. He has never used smokeless tobacco. He reports that he does not drink alcohol and does not use  drugs.  No Known Allergies  Family History  Problem Relation Age of Onset   Hypertension Father        Also maternal grandmother   Diabetes Father        also maternal grandmother   CVA Father    Cancer Maternal Grandfather        lung   Heart attack Maternal Grandfather     Prior to Admission medications   Medication Sig Start Date End Date Taking? Authorizing Provider  carvedilol  (COREG ) 12.5 MG tablet Take 12.5 mg by mouth daily.   Yes [provider]  chlorproMAZINE  (THORAZINE ) 10 MG tablet Take 1 tablet (10 mg total) by mouth 2 (two)  times daily as needed. Patient taking differently: Take 10 mg by mouth 2 (two) times daily as needed for hiccoughs. 11/07/23  Yes Midge Golas, MD  cinacalcet  (SENSIPAR ) 30 MG tablet Take 30 mg by mouth daily.   Yes [provider]  fenofibrate (TRICOR) 145 MG tablet Take 145 mg by mouth daily.   Yes [provider]  icosapent  Ethyl (VASCEPA ) 1 g capsule Take 2 g by mouth 2 (two) times daily.   Yes [provider]  insulin  degludec (TRESIBA ) 200 UNIT/ML FlexTouch Pen Inject 30 Units into the skin at bedtime. 09/23/21  Yes [provider]  insulin  lispro (HUMALOG KWIKPEN) 200 UNIT/ML KwikPen Inject 25-40 Units into the skin 3 (three) times daily as needed (high blood sugar). 09/02/20  Yes [provider]  mycophenolate  (CELLCEPT ) 500 MG tablet Take 1,000 mg by mouth 2 (two) times daily.   Yes [provider]  predniSONE  (DELTASONE ) 5 MG tablet Take 5 mg by mouth daily.   Yes [provider]  traZODone  (DESYREL ) 50 MG tablet Take 50 mg by mouth at bedtime.   Yes [provider]  Vitamin D, Ergocalciferol, (DRISDOL) 1.25 MG (50000 UNIT) CAPS capsule Take 50,000 Units by mouth every 7 (seven) days. 03/02/23  Yes [provider]  amLODipine  (NORVASC ) 10 MG tablet Take 1 tablet (10 mg total) by mouth at bedtime. 09/14/11 05/13/23  Danton Reyes DASEN, MD  carvedilol  (COREG ) 25 MG tablet Take 1 tablet (25 mg total) by mouth 2 (two) times daily with a meal. 09/14/11 05/13/23  Danton Reyes DASEN, MD  [facility] 15 MG/0.5ML Pen Inject 15 mg into the skin once a week.    [provider]  sulfamethoxazole -trimethoprim  (BACTRIM ) 400-80 MG tablet Take 1 tablet by mouth every Monday, Wednesday, and Friday. Patient not taking: Reported on 11/10/2023    [provider]  tacrolimus  (PROGRAF ) 1 MG capsule Take 2-3 mg by mouth See admin instructions. Take 3 capsules (3mg ) in the morning and then take 2 capsules (2mg ) at  bedtime. Patient not taking: Reported on 11/10/2023    [provider]  tadalafil (CIALIS) 20 MG tablet Take 20 mg by mouth daily as needed for erectile dysfunction.    [provider]    Physical Exam: Vitals:   11/10/23 0238 11/10/23 0739 11/10/23 1231 11/10/23 1308  BP: (!) 135/92 124/77  118/83  Pulse: (!) 102 99    Resp: 18     Temp: 98.1 F (36.7 C) 98.7 F (37.1 C)  98 F (36.7 C)  TempSrc: Oral Oral Oral Oral  SpO2: 100% 98%  95%  Weight:      Height:       Constitutional: NAD, calm, uncomfortable due to ongoing incessant hiccups Respiratory: clear to auscultation bilaterally, no wheezing, no crackles. Normal respiratory  effort. No accessory muscle use.  Cardiovascular: Regular rate and rhythm, no murmurs / rubs / gallops. No extremity edema. 2+ pedal pulses.  Abdomen: no tenderness, no masses palpated. No hepatosplenomegaly. Bowel sounds positive.  Musculoskeletal: no clubbing / cyanosis. No joint deformity upper and lower extremities. Good ROM, no contractures. Normal muscle tone.  Skin: no rashes, lesions, ulcers. No induration Neurologic: CN 2-12 grossly intact. Sensation intact,  Strength 5/5 x all 4 extremities.  Continues to have intractable hiccups Psychiatric: Normal judgment and insight. Alert and oriented x 3. Normal mood.     Data Reviewed:  As per HPI  Assessment and Plan: Intractable hiccups Per patient report ongoing x 10 days and initially began right after undergoing colonoscopy Has been on oral Thorazine  for more than 7 days without any improvement in symptoms Was given Thorazine  50 mg IM x 1 today and was started on baclofen  5 mg twice daily.  Patient briefly had respite from the hiccups but in the past 20 minutes they have resumed. Up-to-date reviewed as well as Epocrates.  Recommendation for IM Thorazine  was x 1 dose only.  Recommendation was found to give olanzapine  with baclofen  if hiccups return.  Recommended dosage for 7 to 15  days.  Nausea and vomiting with associated dehydration No further emesis and patient is asking for food Thus far today he has tolerated solid diet without emesis or other symptoms Given he has eaten very poorly over greater than 1 week I have put him on low rate IV fluid since he is a dialysis patient-encouraged oral intake of fluids as well  Reported black stools Check fecal occult blood GI consulted  Abnormal troponin Troponin I high-sensitivity mildly elevated at 21 and consistent with demand ischemia Patient has not had any chest pain at rest or with exertion.  His chest pain primarily occurs with recurrent hiccups. He has been deconditioned since his January hospitalization and does become easily fatigued with activity Appreciate cardiology assistance.  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 MWF Appears to be euvolemic if not hypovolemic based on history and current exam Appreciate assistance of nephrology team-plans to dialyze today with focus on electrolyte stabilization  Recurrent issues with inappropriate tachycardia Appreciate cardiology assistance Carvedilol  has been changed to Toprol  Suspect recurrent tachycardia related to significant physical deconditioning that was secondary to January 2025 hospitalization, chronic severe anemia, dialysis, obesity in chronic disease states  Diabetes mellitus 2 on insulin  Eating so we will resume home dose of insulin  glargine Follow CBGs and provide SSI This admission hemoglobin A1c elevated at 9.3  Hypertension/chronic HFpEF As noted patient is euvolemic and likely hypovolemic Patient has had multiple changes to his medications in the past 6 months.  His Norvasc  and clonidine  were weaned and discontinued Carvedilol  had been decreased to 12.5 twice daily and as of today cardiology has discontinued in favor of Toprol  in regards to inappropriate  tachycardia  Failed renal transplant January 2025 Continue prednisone , Prograf -patient states he is no longer on CellCept  Also on prophylactic Bactrim  Monday Wednesday Friday  HLD Continue Vascepa     Advance Care Planning:   Code Status: Full Code   VTE prophylaxis: Subcutaneous heparin   Consults: Nephrology, GI, cardiology  Family Communication: Family at bedside  Severity of Illness: The appropriate patient status for this patient is INPATIENT. Inpatient status is judged to be reasonable and necessary in order to provide the required intensity of service to  ensure the patient's safety. The patient's presenting symptoms, physical exam findings, and initial radiographic and laboratory data in the context of their chronic comorbidities is felt to place them at high risk for further clinical deterioration. Furthermore, it is not anticipated that the patient will be medically stable for discharge from the hospital within 2 midnights of admission.   * I certify that at the point of admission it is my clinical judgment that the patient will require inpatient hospital care spanning beyond 2 midnights from the point of admission due to high intensity of service, high risk for further deterioration and high frequency of surveillance required.*  Author: Isaiah Lever, NP 11/10/2023 3:50 PM  For on call review [email or web address].

## 2023-11-10 NOTE — Progress Notes (Signed)
 Please see my attestation and notes but duplicating here to show Cardiology has seen the patient.  Echocardiogram 11/10/2023:   1. There is mid cavity acceleration of up to , unable to augment  with Valsalva. Left ventricular ejection fraction, by estimation, is 65 to  70%. The left ventricle has normal function. The left ventricle has no  regional wall motion abnormalities.  There is mild concentric left ventricular hypertrophy. Left ventricular  diastolic parameters are consistent with Grade I diastolic dysfunction  (impaired relaxation).   2. Right ventricular systolic function is normal. The right ventricular  size is normal. Tricuspid regurgitation signal is inadequate for assessing  PA pressure.   3. The mitral valve is normal in structure. Trivial mitral valve  regurgitation.   4. The aortic valve has an indeterminant number of cusps. Aortic valve regurgitation is trivial.   5. The inferior vena cava is normal in size with greater than 50% respiratory variability, suggesting right atrial pressure of 3 mmHg.    Patient seen and examined.  Patient was tachycardic when I entered the room, heart rate resting has been around 125 bpm.  I also performed orthostatic evaluation.  Below are my recommendations.   1.  Resting sinus tachycardia related to a combination of chronic disease state, severe anemia, hemodialysis, deconditioning and obesity contributing to his resting sinus tachycardia. 2.  Diagnosis of chronic diastolic heart failure remains very questionable at this point.  I reviewed his echocardiogram, he has hyperdynamic LVEF with intraventricular pressure gradient.  However his blood pressure is very soft and he also has orthostatic hypotension.  Hence in the absence of urinary output, inability to use disease modifying agents like ACE inhibitor's or ARB due to renal failure, do not think beta-blockers would be the greatest of the choices.  However in view of resting sinus tachycardia,  I have opted to start him on metoprolol  succinate 25 mg daily which has better heart rate control than carvedilol . 3.  Orthostatic hypotension due to autonomic insufficiency related to uncontrolled diabetes mellitus with hyperglycemia.      Lab Results  Component Value Date    HGBA1C 9.3 (H) 11/10/2023  4.  Elevated serum troponin Nonspecific finding and the patient was got end-stage renal disease and on hemodialysis.  There is no clinical evidence of heart failure, he has had cardiac catheterization in 2022 revealing no significant coronary artery disease as well.   Hence from cardiac standpoint no indication for follow-up, will sign off, will evaluate his lipids, agree with discontinuing fenofibric acid in view of elevated LFTs, will make final recommendation regarding lipid management once labs are available to us .   Orders for orthostatic check for documentation placed today.    Gordy Bergamo, MD, [facility] 11/10/2023, 1:24 PM [facility] [address] [place], [place] [address] Phone: [phone]. Fax:  [phone]

## 2023-11-10 NOTE — Telephone Encounter (Signed)
 Copied from CRM #[id number]. Topic: Information Request - General Information Request/Update >> Nov 10, 2023  9:17 AM Laray DEL wrote: Barry Nichols is calling other request    Include all details related to the request(s) below:  Cardiac enzymes were high Admitted to [facility] last night    Confirm and type the Best Contact Number below:  Patient/caller contact number:  Barry Nichols [phone]           [] Home  [x] Mobile  [] Work [] Other   [x] Okay to leave a voicemail   Medication List:  Current Outpatient Medications:  .  amLODIPine  (NORVASC ) 5 mg tablet, Take 10 mg by mouth daily., Disp: , Rfl:  .  BD Nano 2nd Gen Pen Needle 32 gauge x 5/32 ndle, USE ONE PEN NEEDLE DAILY, Disp: 90 each, Rfl: 3 .  blood-glucose sensor (FreeStyle Libre 3 Sensor), 1 each by miscellaneous route every 14 (fourteen) days., Disp: 6 each, Rfl: 1 .  carvediloL  (COREG ) 25 mg tablet, Take 25 mg by mouth in the morning and 25 mg in the evening. Take with meals., Disp: , Rfl:  .  cinacalcet  (SENSIPAR ) 30 mg tablet, Take 30 mg by mouth daily. Take with food or shortly afer a meal. Swallow tablet whole; do not break or divide., Disp: , Rfl:  .  cloNIDine  (CATAPRES ) 0.3 mg tablet, Take 1 tablet by mouth 2 (two) times a day., Disp: , Rfl:  .  ergocalciferol (VITAMIN D2) 1,250 mcg (50,000 unit) capsule, Take 50,000 Units by mouth once a week. Weekly x4 months then monthly thereafter, Disp: , Rfl:  .  fenofibrate nanocrystallized (TRICOR) 145 mg tablet, Take 1 tablet (145 mg total) by mouth daily., Disp: 90 tablet, Rfl: 2 .  icosapent  ethyL 1 gram cap, Take 2 g by mouth in the morning and 2 g in the evening. Take with meals., Disp: 360 capsule, Rfl: 2 .  insulin  lispro (HumaLOG KwikPen) 100 unit/mL KwikPen, Give insulin  based on sliding scale three times daily either 15 minutes before or after meals. (125-150=2units ,151-200=4units, 201-250=6Units, 251-300=8units, 301-350=10Units, 351-400=12units >500call office., Disp: 40 mL, Rfl:  0 .  insulin  syringe,safety needle (BD SafetyGlide Insulin  Syringe) 0.5 mL 30 gauge x 5/16 syrg, Use as directed, Disp: 50 each, Rfl: 2 .  mycophenolate  (CELLCEPT ) 500 mg tablet, Take 1.5 tablets (750 mg total) by mouth 2 (two) times a day., Disp: 270 tablet, Rfl: 0 .  predniSONE  (DELTASONE ) 5 mg tablet, Take 5 mg by mouth daily., Disp: , Rfl:  .  tacrolimus  (PROGRAF ) 1 mg capsule, Take 4 capsules (4 mg total) by mouth in the morning and 4 capsules (4 mg total) in the evening., Disp: 720 capsule, Rfl: 0 .  traZODone  (DESYREL ) 50 mg tablet, Take 50 mg by mouth at bedtime., Disp: , Rfl:  .  Tresiba  FlexTouch U-100 100 unit/mL (3 mL) pen, Inject 34 Units under the skin daily., Disp: 45 mL, Rfl: 1 No current facility-administered medications for this visit.     Medication Request/Refills: Pharmacy Information (if applicable)   [x] Not Applicable       []  Pharmacy listed  Send Medication Request to:                                                 [] Pharmacy not listed (added to pharmacy list in Epic) Send Medication Request to:      Listed  Pharmacies: [id number] - [place], [place] - [address] - PHONE: [phone] - FAX: [phone] [facility] - [place], [place] - [address] - PHONE: [phone] - FAX: [phone]

## 2023-11-10 NOTE — Consult Note (Addendum)
 Cardiology Consultation   Patient ID: Barry Nichols MRN: [id number]; DOB: 1975-06-05  Admit date: 11/09/2023 Date of Consult: 11/10/2023  PCP:  Tammy Tari Barry Nichols   [facility] Providers Cardiologist:  Gordy Bergamo, MD        Patient Profile: Barry Nichols is a 48 y.o. male with a hx of chronic diastolic heart failure, ESRD s/p renal transplant in 2023 with rejection in 05/2023 currently on HD, hypertension, T2DM, hx pancreatitis, hepatic steatosis, and hypothyroidism who is being seen 11/10/2023 for the evaluation of elevated troponin/ECG changes at the request of Barry Lever NP.  History of Present Illness: [name] previously seen by Dr. Bergamo in 2018 for chronic diastolic heart failure also noted to have moderate pulmonary hypertension. Patient transferred cardiology services to [facility] for renal transplant evaluation briefly in 2021 per chart review. At that time he had an abnormal stress test followed by a LHC showing normal coronaries. No cardiology follow up after this time period.  Recent echocardiogram 05/2023 LVEF 55% with mild concentric hypertrophy no RWMA. Grade 2 diastolic dysfunction.Moderate MR. Mild-moderate TR.  Presented to [facility] ED for recurrent emesis and hiccups on thorazine . Recent diagnosis of acute pancreatitis on 7/17, and this is his third time presenting to the ED for emesis/nausea and hiccups. Noted in the ED to have an episode of respiratory distress with HR >140. It resolved after emesis.  Lab work notable for Cr 12.7, albumin 3.3, lipase 211, AST 66, ALT 80, Hgb 7.6 [all baseline] A1c 9.3% Hs troponin T 266 ->247 at [facility] with hs troponin I 21 at [facility] ECG: sinus with TWI in III, aVF, V5, V6 and PAC VR 108 [ compared to prior intermittent chronic TWI in similar leads ] He was transferred to [facility] overnight.   On interview patient only has chest pain when his hiccups worsen, no chest pain with exertion. He does get short  of breath with exertion though he is deconditioned after a prolonged hospitalization in Jan. Denied orthopnea.   Hiccups started about a week ago and have not gone away. He does get anxious at times when the hiccups effect his breathing. When the hiccups started he stopped taking all of his medications. Did share that when he restarted HD they did wean his clonidine  off and his coreg  was reduced to 12.5 mg.   Denied tobacco use, eoth use, and illicit drug use.   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, Bilateral   Migraine    when I was young (08/25/2016   Posterior equinus, acquired    Bilateral   Pre-diabetes     Past Surgical History:  Procedure Laterality Date   AV FISTULA PLACEMENT Left 10/15/2016   Procedure: ARTERIOVENOUS (AV) FISTULA CREATION-LEFT ARM;  Surgeon: Laurence Redell CROME, MD;  Location: [facility] OR;  Service: Vascular;  Laterality: Left;   DECOMPRESSION HIP-CORE Left 12/07/2022   Procedure: LEFT HIP CORE DECOMPRESSION WITH ILIAC CREST BONE MARROW ASPIRATE;  Surgeon: Genelle Standing, MD;  Location: [facility] OR;  Service: Orthopedics;  Laterality: Left;   INSERTION OF DIALYSIS CATHETER Right 10/15/2016   Procedure: INSERTION OF DIALYSIS CATHETER;  Surgeon: Laurence Redell CROME, MD;  Location: [facility] OR;  Service: Vascular;  Laterality: Right;   KIDNEY TRANSPLANT  06/15/2020   [facility]   WISDOM TOOTH EXTRACTION         Scheduled Meds:  baclofen   5 mg Oral BID   heparin   5,000 Units Subcutaneous Q8H   icosapent  Ethyl  2 g Oral BID   insulin  aspart  0-5 Units Subcutaneous QHS   insulin  aspart  0-6 Units  Subcutaneous TID WC   ondansetron  (ZOFRAN ) IV  4 mg Intravenous Q8H   pantoprazole  (PROTONIX ) IV  40 mg Intravenous Q12H   sodium chloride  flush  3 mL Intravenous Q12H   Continuous Infusions:  sodium chloride  Stopped (11/10/23 1128)   promethazine  (PHENERGAN ) injection (IM or IVPB) 25 mg (11/10/23 1128)   PRN Meds: acetaminophen , promethazine  (PHENERGAN ) injection (IM or IVPB)  Allergies:   No Known Allergies  Social History:   Social History   Socioeconomic History   Marital status: Married    Spouse name: Not on file   Number of children: 1   Years of education: BA   Highest education level: Not on file  Occupational History   Not on file  Tobacco Use   Smoking status: Never   Smokeless tobacco: Never  Vaping Use   Vaping status: Never Used  Substance and Sexual Activity   Alcohol use: No    Comment: rare   Drug use: No    Comment: formerly used Marijuana   Sexual activity: Not on file  Other Topics Concern   Not on file  Social History Narrative   Drinks tea 3-5 times a week    Social Drivers of Corporate investment banker Strain: Not on file  Food Insecurity: No Food Insecurity (11/10/2023)   Hunger Vital Sign    Worried About Running Out of Food in the Last Year: Never true    Ran Out of Food in the Last Year: Never true  Transportation Needs: No Transportation Needs (11/10/2023)   PRAPARE - Administrator, Civil Service (Medical): No    Lack of Transportation (Non-Medical): No  Physical Activity: Not on file  Stress: Not on file  Social Connections: Not on file  Intimate Partner Violence: Not At Risk (11/10/2023)   Humiliation, Afraid, Rape, and Kick questionnaire    Fear of Current or Ex-Partner: No    Emotionally Abused: No    Physically Abused: No    Sexually Abused: No    Family History:   Family History  Problem Relation Age of Onset   Hypertension Father        Also maternal grandmother   Diabetes Father        also maternal  grandmother   CVA Father    Cancer Maternal Grandfather        lung   Heart attack Maternal Grandfather      ROS:  Please see the history of present illness.  All other ROS reviewed and negative.     Physical Exam/Data: Vitals:   11/10/23 0145 11/10/23 0235 11/10/23 0238 11/10/23 0739  BP: 125/75  (!) 135/92 124/77  Pulse: 100 94 (!) 102 99  Resp: 16 13 18    Temp:   98.1 F (36.7 C) 98.7 F (37.1 C)  TempSrc:   Oral Oral  SpO2: 92% 97% 100% 98%  Weight:  98.6 kg    Height:  5' 10 (1.778 m)      Intake/Output Summary (Last 24 hours) at 11/10/2023 1141 Last data filed at 11/10/2023 1128 Gross per 24 hour  Intake 306.17 ml  Output --  Net 306.17 ml      11/10/2023    2:35 AM 11/09/2023   11:21 PM 11/06/2023   10:35 PM  Last 3 Weights  Weight (lbs) 217 lb 6 oz 215 lb 13.3 oz 216 lb  Weight (kg) 98.6 kg 97.9 kg 97.977 kg     Body mass index is 31.19 kg/m.  General:  Laying in bed in no acute distress though continuously hiccuping HEENT: normal Vascular: Distal pulses 2+ bilaterally Cardiac:  Regular rate and tachycardic, normal S1, S2; no murmur  Lungs:  clear to auscultation bilaterally, no wheezing, rhonchi or rales  Ext: no edema Musculoskeletal:  No deformities, BUE and BLE strength normal and equal Skin: warm and dry  Neuro:  CNs 2-12 intact, no focal abnormalities noted Psych:  Normal affect   EKG:  The EKG was personally reviewed and demonstrates:  See HPI Telemetry:  Telemetry was personally reviewed and demonstrates:  Sinus with frequent ectopy ~HR 100  Relevant CV Studies: Outside hospital studies:  Echocardiogram 05/27/23 CONCLUSIONS   1. Left ventricle  The left ventricular cavity size is mildly dilated. There     is mild concentric hypertrophy noted. Systolic function is normal by the     3D method. The ejection fraction is 55%  +--5% . Wall motion is normal;     there are no regional wall motion abnormalities. Grade 2 diastolic     dysfunction  with elevated left atrial pressure. The LVOT stroke volume     index is 26ml-m^2.  2. Right ventricle  The right ventricle is poorly visualized. The right     ventricular cavity size is normal by visual assessment. Systolic function     is normal by visual assessment.  3. Left atrium  The left atrium is severely dilated by 2D- qualitative     assessment.  4. Mitral valve  There is moderate mitral regurgitation.  5. Tricuspid valve  There is mild-moderate tricuspid regurgitation.  6. Pulmonary arteries  Pulmonary artery estimated peak systolic pressure     60mm Hg.   LHC 05/10/19 Angiographic findings   Cardiac Arteries and Lesion Findings  LMCA  Mild proximal disease in the 20% range  LAD  Tortuous vessel The DG1 branch has a 40% proximal stenosis. No  significant disease in the LAD territory  LCx  No significant disease  RCA  Irregularities No significant disease   FINDINGS  Mild irregularities in the coroanry arteries.  No significant disease  Diagnostic Procedure Recommendations  False positive stress test  Continue preventive medicine  From cardiac stand point the patient is clear to enter in the kidney  transplant list   Laboratory Data: High Sensitivity Troponin:   Recent Labs  Lab 11/10/23 0519  TROPONINIHS 21*     Chemistry Recent Labs  Lab 11/04/23 2146 11/06/23 2309 11/09/23 1813  NA 132* 135 135  K 4.5 4.7 4.1  CL 87* 87* 89*  CO2 24 26 26   GLUCOSE 202* 142* 117*  BUN 50* 52* 53*  CREATININE 12.20* 12.90* 12.70*  CALCIUM 10.0 9.7 9.7  GFRNONAA 5* 4* 4*  ANIONGAP 21* 22* 20*    Recent Labs  Lab 11/04/23 2146 11/06/23 2309 11/09/23 1813  PROT 8.3* 7.9 8.0  ALBUMIN 3.7 3.4*  3.3*  AST 49* 52* 66*  ALT 77* 72* 80*  ALKPHOS 111 99 96  BILITOT 0.6 0.6 0.5   Lipids No results for input(s): CHOL, TRIG, HDL, LABVLDL, LDLCALC, CHOLHDL in the last 168 hours.  Hematology Recent Labs  Lab 11/04/23 2146 11/06/23 2309 11/09/23 1813  WBC  9.3 6.5 6.8  RBC 3.26* 2.87* 2.68*  HGB 9.3* 8.1* 7.6*  HCT 28.3* 25.2* 23.4*  MCV 86.8 87.8 87.3  MCH 28.5 28.2 28.4  MCHC 32.9 32.1 32.5  RDW 15.3 15.5 15.3  PLT 322 247 324   Thyroid No results for input(s): TSH, FREET4 in the last 168 hours.  BNPNo results for input(s): BNP, PROBNP in the last 168 hours.  DDimer No results for input(s): DDIMER in the last 168 hours.  Radiology/Studies:  DG Chest Portable 1 View Result Date: 11/09/2023 CLINICAL DATA:  SOB EXAM: PORTABLE CHEST 1 VIEW COMPARISON:  Chest x-ray 11/09/2023 FINDINGS: Prominence of the cardiac silhouette likely due to AP portable technique and low lung volumes. The heart and mediastinal contours unchanged. Atherosclerotic plaque Low lung volumes. No focal consolidation. No pulmonary edema. No pleural effusion. No pneumothorax. No acute osseous abnormality. IMPRESSION: Low lung volumes with no active disease. Electronically Signed   By: Morgane  Naveau M.D.   On: 11/09/2023 19:21     Assessment and Plan: Elevated troponin His ECG findings yesterday are comparable to previous ECGs seen in our system with intermittent TWI [2014 and 2018]. In 2021 he underwent a cardiac work-up for his renal transplant evaluation with a LHC showing no coronary obstruction.  Patient denied anginal symptoms, only chest discomfort related to his chronic hiccups.  Hs troponin T 266-> 247 Hs troponin I 21 with repeat pending, will follow up on result He was given asa 325mg  yesterday and transferred overnight to [facility]  I do not suspect this is ACS, and that his troponin elevation is demand ischemia in the setting of tachycardia and HD. Echocardiogram pending, will assess for change in EF or RWMA, if none present would not recommend further ischemic evaluation.   Chronic diastolic heart failure Mild to moderate MR and TR Originally followed with Dr. Ladona back in 2018 2/2 hypertension. He then was lost to follow up with a cardiologist in  general after his cardiac catheterization at [facility].  Recent outside echocardiogram showed LVEF 55% with no RWMA. Mild concentric LVH. Grade II diastolic dysfunction. Valvular disease stable from previous 2018 echocardiogram. Appears euvolemic on exam. He is due for HD today. Fluid management per nephrology.  Repeat echocardiogram pending.   Hypertension H/o of tachycardia Patient reported being weaned off clonidine  when he re started HD this year. Additionally his coreg  had been reduced to 12.5mg . He stopped taking coreg  and amlodipine  one week ago as he was worried one of his medications caused his acute pancreatitis and hiccups.  On chart review noted to have a history of tachycardia as well that his coreg  was managing prior to his renal transplant. BP: 124/77 HR ~100 Would consider restarting his coreg  at a reduced dose of 3.125 mg BID after HD today if blood pressure is elevated. Would hold home amlodipine  for now.  I suspect his tachycardia is from his current nausea/emesis and intractable hiccups.    Hyperlipidemia Recent episode of acute pancreatis without clear etiology on 11/04/23 which could be contributing to his nausea and emesis. Patient was told one of his medications may have caused it, so he subsequently stopped all of his medications. He was originally  on Fenofibrate and Vascepa . There is a contraindication with fenofibrate and ESRD as it is not dialyzed. Fenofibrate can also cause liver injury [patient has elevated liver function panel]. Would not recommend re-starting this medication.  Lipid panel pending.   Restart vascepa   ESRD s/p renal transplant in 2022, rejection 05/2023 currently on HD Anemia of chronic disease Per nephrology  T2DM A1c 9.3% Continue SSI  Per primary Hepatic steatosis Emesis/Nausea Hiccups  Risk Assessment/Risk Scores:              For questions or updates, please contact [facility] Please consult [email or web address] for contact  info under    Signed, Leontine LOISE Salen, PA-C  11/10/2023 11:41 AM

## 2023-11-10 NOTE — Progress Notes (Signed)
 Received patient in bed to unit from day shift HD LPN ERIC Alert and oriented.  Informed consent signed and in chart. TX duration: 3.25 Per hand over HGB was 6.9, day shift RN informed attending DR. Regalado for the result. See notes  Awaiting for the blood typing result Blood bank called to informed that blood is available. Noted 18 mins remaining time for HD. Awaiting for the official result for blood typing. HD Done. Patient tolerated well.  Transported back to the room Alert, without acute distress.  Hand-off given to patient's nurse.   Access used: AVF  Access issues: none  Total UF removed:  Medication(s) given: SEE MAR  Post HD VS: See table  Post HD weight: 100.3 kg bed wt     11/10/23 2104  Vitals  Temp 99 F (37.2 C)  Temp Source Oral  BP (!) 136/92  MAP (mmHg) 104  BP Location Right Arm  BP Method Automatic  Patient Position (if appropriate) Lying  Pulse Rate (!) 104  ECG Heart Rate (!) 108  Resp 20  Oxygen Therapy  SpO2 100 %  O2 Device Room Air  During Treatment Monitoring  Blood Flow Rate (mL/min) 0 mL/min  Arterial Pressure (mmHg) 111.71 mmHg  Venous Pressure (mmHg) -221.8 mmHg  TMP (mmHg) 17.77 mmHg  Ultrafiltration Rate (mL/min) 504 mL/min  Dialysate Flow Rate (mL/min) 300 ml/min  Dialysate Potassium Concentration 3  Dialysate Calcium Concentration 2.5  Duration of HD Treatment -hour(s) 3.25 hour(s)  Cumulative Fluid Removed (mL) per Treatment  1000.11  HD Safety Checks Performed Yes  Intra-Hemodialysis Comments Tx completed  Post Treatment  Dialyzer Clearance Lightly streaked  Hemodialysis Intake (mL) 0 mL  Liters Processed 78  Fluid Removed (mL) 1000 mL  Tolerated HD Treatment Yes  AVG/AVF Arterial Site Held (minutes) 5 minutes  AVG/AVF Venous Site Held (minutes) 5 minutes  Note  Patient Observations pT STABLE AXO X 4, NO FURTHER COMPLAINTS  Fistula / Graft Left Forearm Arteriovenous fistula  No placement date or time found.    Placed prior to admission: Yes  Orientation: Left  Access Location: Forearm  Access Type: Arteriovenous fistula  Site Condition No complications  Fistula / Graft Assessment Present;Thrill;Bruit  Status Deaccessed  Drainage Description None     [place] CHARLENA Montgomery, BSN, RN Kidney Dialysis Unit

## 2023-11-10 NOTE — Progress Notes (Signed)
 Updated Pt's attending to the time left to HD at 1 hr and 28 mins. Pt's Attending Dr. Madelyne ordered to transfuse blood and type and screen. Consent signed for BT. Blood extracted and sent to the blood bank for type and screening.

## 2023-11-10 NOTE — Progress Notes (Signed)
 Date and time results received: 11/10/23 at 1513 (use smartphrase .now to insert current time)  Test: Hemoglobin Critical Value: 6.5  Name of Provider Notified: Dr. Regalado

## 2023-11-11 ENCOUNTER — Inpatient Hospital Stay (HOSPITAL_COMMUNITY): Admitting: Anesthesiology

## 2023-11-11 ENCOUNTER — Encounter (HOSPITAL_COMMUNITY)
Admission: EM | Disposition: A | Discharge status: Home / Self Care | Payer: Self-pay | Source: Home / Self Care | Attending: Internal Medicine

## 2023-11-11 ENCOUNTER — Encounter (HOSPITAL_COMMUNITY): Payer: Self-pay | Admitting: Internal Medicine

## 2023-11-11 DIAGNOSIS — R748 Abnormal levels of other serum enzymes: Secondary | ICD-10-CM | POA: Diagnosis not present

## 2023-11-11 DIAGNOSIS — N184 Chronic kidney disease, stage 4 (severe): Secondary | ICD-10-CM | POA: Diagnosis not present

## 2023-11-11 DIAGNOSIS — K2100 Gastro-esophageal reflux disease with esophagitis, without bleeding: Secondary | ICD-10-CM

## 2023-11-11 DIAGNOSIS — K2210 Ulcer of esophagus without bleeding: Secondary | ICD-10-CM

## 2023-11-11 DIAGNOSIS — K2211 Ulcer of esophagus with bleeding: Secondary | ICD-10-CM

## 2023-11-11 DIAGNOSIS — D631 Anemia in chronic kidney disease: Secondary | ICD-10-CM

## 2023-11-11 DIAGNOSIS — Z992 Dependence on renal dialysis: Secondary | ICD-10-CM

## 2023-11-11 DIAGNOSIS — N186 End stage renal disease: Secondary | ICD-10-CM | POA: Diagnosis not present

## 2023-11-11 DIAGNOSIS — K921 Melena: Secondary | ICD-10-CM

## 2023-11-11 DIAGNOSIS — K2970 Gastritis, unspecified, without bleeding: Secondary | ICD-10-CM

## 2023-11-11 DIAGNOSIS — R112 Nausea with vomiting, unspecified: Secondary | ICD-10-CM | POA: Diagnosis not present

## 2023-11-11 HISTORY — PX: ESOPHAGOGASTRODUODENOSCOPY: SHX5428

## 2023-11-11 LAB — RENAL FUNCTION PANEL
Albumin: 1.8 g/dL — ABNORMAL LOW (ref 3.5–5.0)
Anion gap: 15 (ref 5–15)
BUN: 36 mg/dL — ABNORMAL HIGH (ref 6–20)
CO2: 24 mmol/L (ref 22–32)
Calcium: 8.1 mg/dL — ABNORMAL LOW (ref 8.9–10.3)
Chloride: 96 mmol/L — ABNORMAL LOW (ref 98–111)
Creatinine, Ser: 9.39 mg/dL — ABNORMAL HIGH (ref 0.61–1.24)
GFR, Estimated: 6 mL/min — ABNORMAL LOW (ref >60)
Glucose, Bld: 161 mg/dL — ABNORMAL HIGH (ref 70–99)
Phosphorus: 6.5 mg/dL — ABNORMAL HIGH (ref 2.5–4.6)
Potassium: 4.1 mmol/L (ref 3.5–5.1)
Sodium: 135 mmol/L (ref 135–145)

## 2023-11-11 LAB — TYPE AND SCREEN
ABO/RH(D): O POS
Antibody Screen: NEGATIVE
Unit division: 0

## 2023-11-11 LAB — CBC
HCT: 23 % — ABNORMAL LOW (ref 39.0–52.0)
Hemoglobin: 7.5 g/dL — ABNORMAL LOW (ref 13.0–17.0)
MCH: 28.6 pg (ref 26.0–34.0)
MCHC: 32.6 g/dL (ref 30.0–36.0)
MCV: 87.8 fL (ref 80.0–100.0)
Platelets: 312 K/uL (ref 150–400)
RBC: 2.62 MIL/uL — ABNORMAL LOW (ref 4.22–5.81)
RDW: 15.4 % (ref 11.5–15.5)
WBC: 4.9 K/uL (ref 4.0–10.5)
nRBC: 0 % (ref 0.0–0.2)

## 2023-11-11 LAB — LIPID PANEL
Cholesterol: 111 mg/dL (ref 0–200)
HDL: 13 mg/dL — ABNORMAL LOW (ref >40)
LDL Cholesterol: 57 mg/dL (ref 0–99)
Total CHOL/HDL Ratio: 8.5 ratio
Triglycerides: 206 mg/dL — ABNORMAL HIGH (ref <150)
VLDL: 41 mg/dL — ABNORMAL HIGH (ref 0–40)

## 2023-11-11 LAB — HEPATITIS B SURFACE ANTIBODY, QUANTITATIVE: Hep B S AB Quant (Post): 216 m[IU]/mL

## 2023-11-11 LAB — BPAM RBC
Blood Product Expiration Date: 202508202359
ISSUE DATE / TIME: 202507232348
Unit Type and Rh: 5100

## 2023-11-11 LAB — GLUCOSE, CAPILLARY
Glucose-Capillary: 157 mg/dL — ABNORMAL HIGH (ref 70–99)
Glucose-Capillary: 115 mg/dL — ABNORMAL HIGH (ref 70–99)
Glucose-Capillary: 90 mg/dL (ref 70–99)

## 2023-11-11 SURGERY — EGD (ESOPHAGOGASTRODUODENOSCOPY)
Anesthesia: Monitor Anesthesia Care

## 2023-11-11 MED ORDER — CYANOCOBALAMIN 1000 MCG/ML IJ SOLN
1000.0000 ug | Freq: Once | INTRAMUSCULAR | Status: AC
  Administered 2023-11-11: 1000 ug via INTRAMUSCULAR
  Filled 2023-11-11: qty 1

## 2023-11-11 MED ORDER — PROPOFOL 10 MG/ML IV BOLUS
INTRAVENOUS | Status: DC | PRN
  Administered 2023-11-11: 20 mg via INTRAVENOUS
  Administered 2023-11-11: 10 mg via INTRAVENOUS
  Administered 2023-11-11 (×2): 20 mg via INTRAVENOUS
  Administered 2023-11-11: 50 mg via INTRAVENOUS
  Administered 2023-11-11: 20 mg via INTRAVENOUS
  Administered 2023-11-11: 10 mg via INTRAVENOUS

## 2023-11-11 MED ORDER — PANTOPRAZOLE SODIUM 40 MG PO TBEC: 40.0000 mg | DELAYED_RELEASE_TABLET | Freq: Two times a day (BID) | ORAL | 1 refills | Status: DC

## 2023-11-11 MED ORDER — FOLIC ACID 1 MG PO TABS
1.0000 mg | ORAL_TABLET | Freq: Every day | ORAL | Status: DC
  Administered 2023-11-11: 1 mg via ORAL
  Filled 2023-11-11 (×2): qty 1

## 2023-11-11 MED ORDER — PANTOPRAZOLE SODIUM 40 MG PO TBEC
40.0000 mg | DELAYED_RELEASE_TABLET | Freq: Two times a day (BID) | ORAL | Status: DC
  Administered 2023-11-11: 40 mg via ORAL
  Filled 2023-11-11: qty 1

## 2023-11-11 MED ORDER — FENTANYL CITRATE (PF) 100 MCG/2ML IJ SOLN
INTRAMUSCULAR | Status: AC
Start: 2023-11-11 — End: 2023-11-11
  Filled 2023-11-11: qty 2

## 2023-11-11 MED ORDER — FENTANYL CITRATE (PF) 100 MCG/2ML IJ SOLN
INTRAMUSCULAR | Status: DC | PRN
  Administered 2023-11-11 (×2): 50 ug via INTRAVENOUS

## 2023-11-11 MED ORDER — METOCLOPRAMIDE HCL 10 MG PO TABS
10.0000 mg | ORAL_TABLET | Freq: Once | ORAL | Status: AC
  Administered 2023-11-11: 10 mg via ORAL
  Filled 2023-11-11: qty 1

## 2023-11-11 MED ORDER — METOCLOPRAMIDE HCL 5 MG/ML IJ SOLN: 10.0000 mg | Freq: Once | INTRAMUSCULAR | Status: DC

## 2023-11-11 MED ORDER — METOPROLOL SUCCINATE ER 25 MG PO TB24: 25.0000 mg | ORAL_TABLET | Freq: Every evening | ORAL | 1 refills | Status: DC

## 2023-11-11 MED ORDER — OLANZAPINE 5 MG PO TABS: 5.0000 mg | ORAL_TABLET | Freq: Every day | ORAL | 0 refills | Status: DC

## 2023-11-11 MED ORDER — CYANOCOBALAMIN 1000 MCG/ML IJ SOLN
1000.0000 ug | Freq: Once | INTRAMUSCULAR | Status: DC
  Filled 2023-11-11: qty 1

## 2023-11-11 MED ORDER — LIDOCAINE 2% (20 MG/ML) 5 ML SYRINGE
INTRAMUSCULAR | Status: DC | PRN
  Administered 2023-11-11: 60 mg via INTRAVENOUS

## 2023-11-11 NOTE — Plan of Care (Signed)

## 2023-11-11 NOTE — Discharge Summary (Signed)
 PATIENT DETAILS Name: Barry Nichols Age: 48 y.o. Sex: male Date of Birth: 03-31-1976 MRN: [id number]. Admitting Physician: Subrina Sundil, MD ERE:Ndanmwz, Tari DASEN, PA-C  Admit Date: 11/09/2023 Discharge date: 11/11/2023  Recommendations for Outpatient Follow-up:  Follow up with PCP in 1-2 weeks Please obtain CMP/CBC in one week  Admitted From:  Home  Disposition: Home   Discharge Condition: good  CODE STATUS:   Code Status: Full Code   Diet recommendation:  Diet Order             Diet renal with fluid restriction Fluid restriction: 2000 mL Fluid; Room service appropriate? Yes; Fluid consistency: Thin  Diet effective now           Diet - low sodium heart healthy           Diet Carb Modified                    Brief Summary: Patient is a 48 y.o.  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   Significant events: 7/23>> admit to [facility]-Hb dropped post admission to 6.5-1 unit PRBC ordered.   Significant studies: 7/22>> CXR: No PNA 7/23>> CT chest: No acute intrathoracic pathology. 7/23>> echo: EF 55-70%.    Significant microbiology data: None   Procedures: None   Consults: GI Renal  Brief Hospital Course: Intractable hiccups Unclear etiology but given nausea/vomiting/black tarry stools-concerned that he may have peptic ulcer disease Improved overnight-started on olanzapine  No response to oral Thorazine  which he was on prior to this hospitalization. Seems to have gotten better after initiation of olanzapine .  He is okay to continue olanzapine  for a few more days.   Upper GI bleed with acute blood loss anemia Had some black tarry stools overnight-hemoglobin did drop to 6.5-however stable after 1 unit of PRBC. EGD negative for any active bleeding-did show some  esophageal ulcers Patient keen on going home today-he does not wish to remain hospitalized any longer-will continue with PPI-have discussed with nephrology-they will recheck CBC within a week.   Note-recent colonoscopy on 7/1 at [facility]-polyp in cecum/sigmoid colon -repeat colonoscopy x 3 years.  Esophageal ulcers Seen on EGD 7/24 Biopsy pending-please follow   Elevated lipase No clinical evidence or radiological evidence of pancreatitis on CT abdomen done on 7/17. Ongoing supportive care.   ESRD on HD MWF Nephrology followed closely-resume usual outpatient schedule   History of failed renal transplantation Now on HD Remains on immunosuppressive's (prednisone /Prograf ) No longer on mycophenolate . On prophylactic Bactrim .   Normocytic anemia Secondary to ESRD Iron/iron per nephrology.   Sinus tachycardia Appreciate cardiology input-thought to be multifactorial-due to acute illness/anemia/hiccups. Continue metoprolol . Heart rate improved-in the 70s-80s this morning.   Chronic HFpEF Euvolemic Volume removal with hemodialysis.   Mild to moderate MR/TR Outpatient cardiology follow-up.   HLD Fenofibrate held per cardiology recommendation Continue Vascepa    DM-2 (A1c 9.3 on 7/23) CBG stable-resume usual outpatient insulin  regimen per  Class 1 Obesity Estimated body mass index is 31.7 kg/m as calculated from the following:   Height as of this encounter: 5' 10 (1.778 m).   Weight as of this encounter: 100.2 kg.    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 chronic kidney disease (HCC)   Abnormal cardiac enzyme level   Orthostatic  hypotension   Mixed hypercholesterolemia and hypertriglyceridemia   End-stage renal disease on hemodialysis (HCC)   Melena   Ulcer of esophagus with bleeding   Discharge Instructions:  Activity:  As tolerated with Full fall precautions use walker/cane &  assistance as needed Discharge Instructions     Call MD for:  difficulty breathing, headache or visual disturbances   Complete by: As directed    Call MD for:  extreme fatigue   Complete by: As directed    Call MD for:  persistant dizziness or light-headedness   Complete by: As directed    Diet - low sodium heart healthy   Complete by: As directed    Diet Carb Modified   Complete by: As directed    Discharge instructions   Complete by: As directed    Follow with Primary MD  [name], PA-C in 1-2 weeks  Endoscopy biopsies are pending-please ask your primary care practitioner or the primary gastroenterologist to follow-up on these results  Please check a CBC within 1 week  Please get a complete blood count and chemistry panel checked by your Primary MD at your next visit, and again as instructed by your Primary MD.  Get Medicines reviewed and adjusted: Please take all your medications with you for your next visit with your Primary MD  Laboratory/radiological data: Please request your Primary MD to go over all hospital tests and procedure/radiological results at the follow up, please ask your Primary MD to get all Hospital records sent to his/her office.  In some cases, they will be blood work, cultures and biopsy results pending at the time of your discharge. Please request that your primary care M.D. follows up on these results.  Also Note the following: If you experience worsening of your admission symptoms, develop shortness of breath, life threatening emergency, suicidal or homicidal thoughts you must seek medical attention immediately by calling 911 or calling your MD immediately  if symptoms less severe.  You must read complete instructions/literature along with all the possible adverse reactions/side effects for all the Medicines you take and that have been prescribed to you. Take any new Medicines after you have completely understood and accpet all the possible  adverse reactions/side effects.   Do not drive when taking Pain medications or sleeping medications (Benzodaizepines)  Do not take more than prescribed Pain, Sleep and Anxiety Medications. It is not advisable to combine anxiety,sleep and pain medications without talking with your primary care practitioner  Special Instructions: If you have smoked or chewed Tobacco  in the last 2 yrs please stop smoking, stop any regular Alcohol  and or any Recreational drug use.  Wear Seat belts while driving.  Please note: You were cared for by a hospitalist during your hospital stay. Once you are discharged, your primary care physician will handle any further medical issues. Please note that NO REFILLS for any discharge medications will be authorized once you are discharged, as it is imperative that you return to your primary care physician (or establish a relationship with a primary care physician if you do not have one) for your post hospital discharge needs so that they can reassess your need for medications and monitor your lab values.   Increase activity slowly   Complete by: As directed       Allergies as of 11/11/2023   No Known Allergies      Medication List     STOP taking these medications    amLODipine  10 MG tablet Commonly  known as: NORVASC    carvedilol  12.5 MG tablet Commonly known as: COREG    carvedilol  25 MG tablet Commonly known as: COREG    chlorproMAZINE  10 MG tablet Commonly known as: THORAZINE    fenofibrate 145 MG tablet Commonly known as: TRICOR   Mounjaro 15 MG/0.5ML Pen Generic drug: tirzepatide   mycophenolate  500 MG tablet Commonly known as: CELLCEPT        TAKE these medications    cinacalcet  30 MG tablet Commonly known as: SENSIPAR  Take 30 mg by mouth daily.   HumaLOG KwikPen 200 UNIT/ML KwikPen Generic drug: insulin  lispro Inject 25-40 Units into the skin 3 (three) times daily as needed (high blood sugar).   icosapent  Ethyl 1 g capsule Commonly  known as: VASCEPA  Take 2 g by mouth 2 (two) times daily.   insulin  degludec 200 UNIT/ML FlexTouch Pen Commonly known as: TRESIBA  Inject 30 Units into the skin at bedtime.   metoprolol  succinate 25 MG 24 hr tablet Commonly known as: TOPROL -XL Take 1 tablet (25 mg total) by mouth every evening.   OLANZapine  5 MG tablet Commonly known as: ZYPREXA  Take 1 tablet (5 mg total) by mouth daily. Start taking on: November 12, 2023   pantoprazole  40 MG tablet Commonly known as: PROTONIX  Take 1 tablet (40 mg total) by mouth 2 (two) times daily before a meal. Take twice daily x 6 weeks, then switch to once daily.   predniSONE  5 MG tablet Commonly known as: DELTASONE  Take 5 mg by mouth daily.   sulfamethoxazole -trimethoprim  400-80 MG tablet Commonly known as: BACTRIM  Take 1 tablet by mouth every Monday, Wednesday, and Friday.   tacrolimus  1 MG capsule Commonly known as: PROGRAF  Take 2-3 mg by mouth See admin instructions. Take 3 capsules (3mg ) in the morning and then take 2 capsules (2mg ) at bedtime.   tadalafil 20 MG tablet Commonly known as: CIALIS Take 20 mg by mouth daily as needed for erectile dysfunction.   traZODone  50 MG tablet Commonly known as: DESYREL  Take 50 mg by mouth at bedtime.   Vitamin D (Ergocalciferol) 1.25 MG (50000 UNIT) Caps capsule Commonly known as: DRISDOL Take 50,000 Units by mouth every 7 (seven) days.        Follow-up Information     Tammy Rasmussen T, PA-C. Schedule an appointment as soon as possible for a visit in 1 week(s).   Specialty: Physician Assistant Contact information: [address] [place] [address] [phone]                No Known Allergies   Other Procedures/Studies: CT CHEST WO CONTRAST Result Date: 11/10/2023 CLINICAL DATA:  Persistent cough. EXAM: CT CHEST WITHOUT CONTRAST TECHNIQUE: Multidetector CT imaging of the chest was performed following the standard protocol without IV contrast. RADIATION DOSE  REDUCTION: This exam was performed according to the departmental dose-optimization program which includes automated exposure control, adjustment of the mA and/or kV according to patient size and/or use of iterative reconstruction technique. COMPARISON:  Chest radiograph dated 11/09/2023. FINDINGS: Evaluation of this exam is limited in the absence of intravenous contrast. Cardiovascular: There is no cardiomegaly or pericardial effusion. There is coronary vascular calcification. Mild atherosclerotic calcification of the thoracic aorta. No intimal dilatation. The central pulmonary arteries are grossly unremarkable. Mediastinum/Nodes: No hilar or mediastinal adenopathy. The esophagus is grossly unremarkable. A 1.3 cm left thyroid calcified nodule. No mediastinal fluid collection. Lungs/Pleura: No focal consolidation, pleural effusion, pneumothorax. The central airways are patent. Upper Abdomen: No acute abnormality. Musculoskeletal: No acute osseous pathology. IMPRESSION:  1. No acute intrathoracic pathology. 2. Coronary vascular calcification. 3.  Aortic Atherosclerosis (ICD10-I70.0). Electronically Signed   By: Vanetta Chou M.D.   On: 11/10/2023 17:22   ECHOCARDIOGRAM COMPLETE Result Date: 11/10/2023    ECHOCARDIOGRAM REPORT   Patient Name:   Barry Nichols Date of Exam: 11/10/2023 Medical Rec #:  [id number]           Height:       70.0 in Accession #:    [id number]          Weight:       217.4 lb Date of Birth:  [birth year]           BSA:          2.163 m Patient Age:    48 years            BP:           132/89 mmHg Patient Gender: M                   HR:           105 bpm. Exam Location:  Inpatient Procedure: 2D Echo, Cardiac Doppler and Color Doppler (Both Spectral and Color            Flow Doppler were utilized during procedure). Indications:    Elevated Troponins  History:        Patient has no prior history of Echocardiogram examinations.                 ESRD; Risk Factors:Hypertension and Diabetes.   Sonographer:    Damien Senior RDCS Referring Phys: SUBRINA SUNDIL IMPRESSIONS  1. There is mid cavity acceleration of up to , unable to augment with Valsalva. Left ventricular ejection fraction, by estimation, is 65 to 70%. The left ventricle has normal function. The left ventricle has no regional wall motion abnormalities. There is mild concentric left ventricular hypertrophy. Left ventricular diastolic parameters are consistent with Grade I diastolic dysfunction (impaired relaxation).  2. Right ventricular systolic function is normal. The right ventricular size is normal. Tricuspid regurgitation signal is inadequate for assessing PA pressure.  3. The mitral valve is normal in structure. Trivial mitral valve regurgitation.  4. The aortic valve has an indeterminant number of cusps. Aortic valve regurgitation is trivial.  5. The inferior vena cava is normal in size with greater than 50% respiratory variability, suggesting right atrial pressure of 3 mmHg. FINDINGS  Left Ventricle: There is mid cavity acceleration of up to , unable to augment with Valsalva. Left ventricular ejection fraction, by estimation, is 65 to 70%. The left ventricle has normal function. The left ventricle has no regional wall motion abnormalities. The left ventricular internal cavity size was normal in size. There is mild concentric left ventricular hypertrophy. Left ventricular diastolic parameters are consistent with Grade I diastolic dysfunction (impaired relaxation). Right Ventricle: The right ventricular size is normal. No increase in right ventricular wall thickness. Right ventricular systolic function is normal. Tricuspid regurgitation signal is inadequate for assessing PA pressure. Left Atrium: Left atrial size was normal in size. Right Atrium: Right atrial size was normal in size. Pericardium: There is no evidence of pericardial effusion. Mitral Valve: The mitral valve is normal in structure. Trivial mitral valve  regurgitation. Tricuspid Valve: The tricuspid valve is normal in structure. Tricuspid valve regurgitation is trivial. Aortic Valve: The aortic valve has an indeterminant number of cusps. Aortic valve regurgitation is trivial. Pulmonic Valve: The pulmonic valve was normal  in structure. Pulmonic valve regurgitation is trivial. Aorta: The aortic root and ascending aorta are structurally normal, with no evidence of dilitation. Venous: The inferior vena cava is normal in size with greater than 50% respiratory variability, suggesting right atrial pressure of 3 mmHg. IAS/Shunts: No atrial level shunt detected by color flow Doppler.  LEFT VENTRICLE PLAX 2D LVIDd:         4.60 cm   Diastology LVIDs:         3.20 cm   LV e' medial:    5.77 cm/s LV PW:         1.30 cm   LV E/e' medial:  10.8 LV IVS:        1.30 cm   LV e' lateral:   10.40 cm/s LVOT diam:     2.30 cm   LV E/e' lateral: 6.0 LV SV:         67 LV SV Index:   31 LVOT Area:     4.15 cm  RIGHT VENTRICLE RV S prime:     22.60 cm/s TAPSE (M-mode): 2.3 cm LEFT ATRIUM             Index        RIGHT ATRIUM           Index LA diam:        3.50 cm 1.62 cm/m   RA Area:     12.00 cm LA Vol (A2C):   38.9 ml 17.99 ml/m  RA Volume:   24.10 ml  11.14 ml/m LA Vol (A4C):   31.4 ml 14.52 ml/m LA Biplane Vol: 37.2 ml 17.20 ml/m  AORTIC VALVE LVOT Vmax:   112.00 cm/s LVOT Vmean:  85.500 cm/s LVOT VTI:    0.162 m  AORTA Ao Root diam: 3.40 cm Ao Asc diam:  3.30 cm MITRAL VALVE MV Area (PHT): 3.76 cm    SHUNTS MV Decel Time: 202 msec    Systemic VTI:  0.16 m MV E velocity: 62.30 cm/s  Systemic Diam: 2.30 cm MV A velocity: 87.60 cm/s MV E/A ratio:  0.71 Morene Brownie Electronically signed by Morene Brownie Signature Date/Time: 11/10/2023/12:39:26 PM    Final    DG Chest Portable 1 View Result Date: 11/09/2023 CLINICAL DATA:  SOB EXAM: PORTABLE CHEST 1 VIEW COMPARISON:  Chest x-ray 11/09/2023 FINDINGS: Prominence of the cardiac silhouette likely due to AP portable technique  and low lung volumes. The heart and mediastinal contours unchanged. Atherosclerotic plaque Low lung volumes. No focal consolidation. No pulmonary edema. No pleural effusion. No pneumothorax. No acute osseous abnormality. IMPRESSION: Low lung volumes with no active disease. Electronically Signed   By: Morgane  Naveau M.D.   On: 11/09/2023 19:21   CT ABDOMEN PELVIS WO CONTRAST Result Date: 11/04/2023 CLINICAL DATA:  Abdominal pain and elevated lipase, initial encounter EXAM: CT ABDOMEN AND PELVIS WITHOUT CONTRAST TECHNIQUE: Multidetector CT imaging of the abdomen and pelvis was performed following the standard protocol without IV contrast. RADIATION DOSE REDUCTION: This exam was performed according to the departmental dose-optimization program which includes automated exposure control, adjustment of the mA and/or kV according to patient size and/or use of iterative reconstruction technique. COMPARISON:  05/12/2023 FINDINGS: Lower chest: No acute abnormality. Hepatobiliary: No focal liver abnormality is seen. No gallstones, gallbladder wall thickening, or biliary dilatation. Pancreas: Pancreas is well visualized. No significant inflammatory changes are identified. Spleen: Normal in size without focal abnormality. Adrenals/Urinary Tract: Adrenal glands are within normal limits. Kidneys demonstrate bilateral renal cystic change stable  from the prior study. No follow-up is recommended. No renal calculi or obstructive changes are seen. Right lower quadrant renal transplant is noted. Some perinephric stranding is noted stable from the prior exam. The renal sinus is not well visualized likely related to diffuse inflammatory change. 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 The bladder is decompressed. Stomach/Bowel: No obstructive or inflammatory changes of the colon are noted. The appendix is within normal limits. Small bowel and stomach are unremarkable.  Vascular/Lymphatic: Aortic atherosclerosis. No enlarged abdominal or pelvic lymph nodes. Reproductive: Prostate is unremarkable. Other: No abdominal wall hernia or abnormality. No abdominopelvic ascites. Musculoskeletal: No acute or significant osseous findings. IMPRESSION: Diffuse inflammatory changes and loss of renal sinus fat in the transplant kidney consistent with the known history of rejection. Chronic changes. No evidence of pancreatitis. Electronically Signed   By: Oneil Devonshire M.D.   On: 11/04/2023 23:14     TODAY-DAY OF DISCHARGE:  Subjective:   Barry Nichols today has no headache,no chest abdominal pain,no new weakness tingling or numbness, feels much better wants to go home today.   Objective:   Blood pressure (!) 146/39, pulse 92, temperature 98.7 F (37.1 C), temperature source Oral, resp. rate (!) 28, height 5' 10 (1.778 m), weight 100.2 kg, SpO2 97%.  Intake/Output Summary (Last 24 hours) at 11/11/2023 1344 Last data filed at 11/11/2023 1201 Gross per 24 hour  Intake 1011.98 ml  Output 1000 ml  Net 11.98 ml   Filed Weights   11/09/23 2321 11/10/23 0235 11/10/23 1725  Weight: 97.9 kg 98.6 kg 100.2 kg    Exam: Awake Alert, Oriented *3, No new F.N deficits, Normal affect [place].AT,PERRAL Supple Neck,No JVD, No cervical lymphadenopathy appriciated.  Symmetrical Chest wall movement, Good air movement bilaterally, CTAB RRR,No Gallops,Rubs or new Murmurs, No Parasternal Heave +ve B.Sounds, Abd Soft, Non tender, No organomegaly appriciated, No rebound -guarding or rigidity. No Cyanosis, Clubbing or edema, No new Rash or bruise   PERTINENT RADIOLOGIC STUDIES: CT CHEST WO CONTRAST Result Date: 11/10/2023 CLINICAL DATA:  Persistent cough. EXAM: CT CHEST WITHOUT CONTRAST TECHNIQUE: Multidetector CT imaging of the chest was performed following the standard protocol without IV contrast. RADIATION DOSE REDUCTION: This exam was performed according to the departmental  dose-optimization program which includes automated exposure control, adjustment of the mA and/or kV according to patient size and/or use of iterative reconstruction technique. COMPARISON:  Chest radiograph dated 11/09/2023. FINDINGS: Evaluation of this exam is limited in the absence of intravenous contrast. Cardiovascular: There is no cardiomegaly or pericardial effusion. There is coronary vascular calcification. Mild atherosclerotic calcification of the thoracic aorta. No intimal dilatation. The central pulmonary arteries are grossly unremarkable. Mediastinum/Nodes: No hilar or mediastinal adenopathy. The esophagus is grossly unremarkable. A 1.3 cm left thyroid calcified nodule. No mediastinal fluid collection. Lungs/Pleura: No focal consolidation, pleural effusion, pneumothorax. The central airways are patent. Upper Abdomen: No acute abnormality. Musculoskeletal: No acute osseous pathology. IMPRESSION: 1. No acute intrathoracic pathology. 2. Coronary vascular calcification. 3.  Aortic Atherosclerosis (ICD10-I70.0). Electronically Signed   By: Vanetta Chou M.D.   On: 11/10/2023 17:22   ECHOCARDIOGRAM COMPLETE Result Date: 11/10/2023    ECHOCARDIOGRAM REPORT   Patient Name:   Barry Nichols Date of Exam: 11/10/2023 Medical Rec #:  [id number]           Height:       70.0 in Accession #:    [id number]  Weight:       217.4 lb Date of Birth:  16-Feb-1976           BSA:          2.163 m Patient Age:    48 years            BP:           132/89 mmHg Patient Gender: M                   HR:           105 bpm. Exam Location:  Inpatient Procedure: 2D Echo, Cardiac Doppler and Color Doppler (Both Spectral and Color            Flow Doppler were utilized during procedure). Indications:    Elevated Troponins  History:        Patient has no prior history of Echocardiogram examinations.                 ESRD; Risk Factors:Hypertension and Diabetes.  Sonographer:    Damien Senior RDCS Referring Phys: SUBRINA SUNDIL  IMPRESSIONS  1. There is mid cavity acceleration of up to , unable to augment with Valsalva. Left ventricular ejection fraction, by estimation, is 65 to 70%. The left ventricle has normal function. The left ventricle has no regional wall motion abnormalities. There is mild concentric left ventricular hypertrophy. Left ventricular diastolic parameters are consistent with Grade I diastolic dysfunction (impaired relaxation).  2. Right ventricular systolic function is normal. The right ventricular size is normal. Tricuspid regurgitation signal is inadequate for assessing PA pressure.  3. The mitral valve is normal in structure. Trivial mitral valve regurgitation.  4. The aortic valve has an indeterminant number of cusps. Aortic valve regurgitation is trivial.  5. The inferior vena cava is normal in size with greater than 50% respiratory variability, suggesting right atrial pressure of 3 mmHg. FINDINGS  Left Ventricle: There is mid cavity acceleration of up to , unable to augment with Valsalva. Left ventricular ejection fraction, by estimation, is 65 to 70%. The left ventricle has normal function. The left ventricle has no regional wall motion abnormalities. The left ventricular internal cavity size was normal in size. There is mild concentric left ventricular hypertrophy. Left ventricular diastolic parameters are consistent with Grade I diastolic dysfunction (impaired relaxation). Right Ventricle: The right ventricular size is normal. No increase in right ventricular wall thickness. Right ventricular systolic function is normal. Tricuspid regurgitation signal is inadequate for assessing PA pressure. Left Atrium: Left atrial size was normal in size. Right Atrium: Right atrial size was normal in size. Pericardium: There is no evidence of pericardial effusion. Mitral Valve: The mitral valve is normal in structure. Trivial mitral valve regurgitation. Tricuspid Valve: The tricuspid valve is normal in structure.  Tricuspid valve regurgitation is trivial. Aortic Valve: The aortic valve has an indeterminant number of cusps. Aortic valve regurgitation is trivial. Pulmonic Valve: The pulmonic valve was normal in structure. Pulmonic valve regurgitation is trivial. Aorta: The aortic root and ascending aorta are structurally normal, with no evidence of dilitation. Venous: The inferior vena cava is normal in size with greater than 50% respiratory variability, suggesting right atrial pressure of 3 mmHg. IAS/Shunts: No atrial level shunt detected by color flow Doppler.  LEFT VENTRICLE PLAX 2D LVIDd:         4.60 cm   Diastology LVIDs:         3.20 cm   LV e' medial:  5.77 cm/s LV PW:         1.30 cm   LV E/e' medial:  10.8 LV IVS:        1.30 cm   LV e' lateral:   10.40 cm/s LVOT diam:     2.30 cm   LV E/e' lateral: 6.0 LV SV:         67 LV SV Index:   31 LVOT Area:     4.15 cm  RIGHT VENTRICLE RV S prime:     22.60 cm/s TAPSE (M-mode): 2.3 cm LEFT ATRIUM             Index        RIGHT ATRIUM           Index LA diam:        3.50 cm 1.62 cm/m   RA Area:     12.00 cm LA Vol (A2C):   38.9 ml 17.99 ml/m  RA Volume:   24.10 ml  11.14 ml/m LA Vol (A4C):   31.4 ml 14.52 ml/m LA Biplane Vol: 37.2 ml 17.20 ml/m  AORTIC VALVE LVOT Vmax:   112.00 cm/s LVOT Vmean:  85.500 cm/s LVOT VTI:    0.162 m  AORTA Ao Root diam: 3.40 cm Ao Asc diam:  3.30 cm MITRAL VALVE MV Area (PHT): 3.76 cm    SHUNTS MV Decel Time: 202 msec    Systemic VTI:  0.16 m MV E velocity: 62.30 cm/s  Systemic Diam: 2.30 cm MV A velocity: 87.60 cm/s MV E/A ratio:  0.71 Morene Brownie Electronically signed by Morene Brownie Signature Date/Time: 11/10/2023/12:39:26 PM    Final    DG Chest Portable 1 View Result Date: 11/09/2023 CLINICAL DATA:  SOB EXAM: PORTABLE CHEST 1 VIEW COMPARISON:  Chest x-ray 11/09/2023 FINDINGS: Prominence of the cardiac silhouette likely due to AP portable technique and low lung volumes. The heart and mediastinal contours unchanged.  Atherosclerotic plaque Low lung volumes. No focal consolidation. No pulmonary edema. No pleural effusion. No pneumothorax. No acute osseous abnormality. IMPRESSION: Low lung volumes with no active disease. Electronically Signed   By: Morgane  Naveau M.D.   On: 11/09/2023 19:21     PERTINENT LAB RESULTS: CBC: Recent Labs    11/10/23 1730 11/11/23 0743  WBC 5.1 4.9  HGB 6.5* 7.5*  HCT 20.1* 23.0*  PLT 312 312   CMET CMP     Component Value Date/Time   NA 135 11/11/2023 0743   K 4.1 11/11/2023 0743   CL 96 (L) 11/11/2023 0743   CO2 24 11/11/2023 0743   GLUCOSE 161 (H) 11/11/2023 0743   BUN 36 (H) 11/11/2023 0743   CREATININE 9.39 (H) 11/11/2023 0743   CALCIUM 8.1 (L) 11/11/2023 0743   PROT 8.0 11/09/2023 1813   ALBUMIN 1.8 (L) 11/11/2023 0743   AST 66 (H) 11/09/2023 1813   ALT 80 (H) 11/09/2023 1813   ALKPHOS 96 11/09/2023 1813   BILITOT 0.5 11/09/2023 1813   GFRNONAA 6 (L) 11/11/2023 0743    GFR Estimated Creatinine Clearance: 11.5 mL/min (A) (by C-G formula based on SCr of 9.39 mg/dL (H)). Recent Labs    11/09/23 1813  LIPASE 211*   No results for input(s): CKTOTAL, CKMB, CKMBINDEX, TROPONINI in the last 72 hours. Invalid input(s): POCBNP No results for input(s): DDIMER in the last 72 hours. Recent Labs    11/10/23 0429  HGBA1C 9.3*   Recent Labs    11/11/23 0743  CHOL 111  HDL 13*  LDLCALC  57  TRIG 206*  CHOLHDL 8.5   No results for input(s): TSH, T4TOTAL, T3FREE, THYROIDAB in the last 72 hours.  Invalid input(s): FREET3 No results for input(s): VITAMINB12, FOLATE, FERRITIN, TIBC, IRON, RETICCTPCT in the last 72 hours. Coags: No results for input(s): INR in the last 72 hours.  Invalid input(s): PT Microbiology: Recent Results (from the past 240 hours)  Resp panel by RT-PCR (RSV, Flu A&B, Covid) Anterior Nasal Swab     Status: None   Collection Time: 11/04/23  9:46 PM   Specimen: Anterior Nasal Swab  Result  Value Ref Range Status   SARS Coronavirus 2 by RT PCR NEGATIVE NEGATIVE Final    Comment: (NOTE) SARS-CoV-2 target nucleic acids are NOT DETECTED.  The SARS-CoV-2 RNA is generally detectable in upper respiratory specimens during the acute phase of infection. The lowest concentration of SARS-CoV-2 viral copies this assay can detect is 138 copies/mL. A negative result does not preclude SARS-Cov-2 infection and should not be used as the sole basis for treatment or other patient management decisions. A negative result may occur with  improper specimen collection/handling, submission of specimen other than nasopharyngeal swab, presence of viral mutation(s) within the areas targeted by this assay, and inadequate number of viral copies(<138 copies/mL). A negative result must be combined with clinical observations, patient history, and epidemiological information. The expected result is Negative.  Fact Sheet for Patients:  BloggerCourse.com  Fact Sheet for Healthcare Providers:  SeriousBroker.it  This test is no t yet approved or cleared by the [place]  FDA and  has been authorized for detection and/or diagnosis of SARS-CoV-2 by FDA under an Emergency Use Authorization (EUA). This EUA will remain  in effect (meaning this test can be used) for the duration of the COVID-19 declaration under Section 564(b)(1) of the Act, 21 U.S.C.section 360bbb-3(b)(1), unless the authorization is terminated  or revoked sooner.       Influenza A by PCR NEGATIVE NEGATIVE Final   Influenza B by PCR NEGATIVE NEGATIVE Final    Comment: (NOTE) The Xpert Xpress SARS-CoV-2/FLU/RSV plus assay is intended as an aid in the diagnosis of influenza from Nasopharyngeal swab specimens and should not be used as a sole basis for treatment. Nasal washings and aspirates are unacceptable for Xpert Xpress SARS-CoV-2/FLU/RSV testing.  Fact Sheet for  Patients: BloggerCourse.com  Fact Sheet for Healthcare Providers: SeriousBroker.it  This test is not yet approved or cleared by the [place]  FDA and has been authorized for detection and/or diagnosis of SARS-CoV-2 by FDA under an Emergency Use Authorization (EUA). This EUA will remain in effect (meaning this test can be used) for the duration of the COVID-19 declaration under Section 564(b)(1) of the Act, 21 U.S.C. section 360bbb-3(b)(1), unless the authorization is terminated or revoked.     Resp Syncytial Virus by PCR NEGATIVE NEGATIVE Final    Comment: (NOTE) Fact Sheet for Patients: BloggerCourse.com  Fact Sheet for Healthcare Providers: SeriousBroker.it  This test is not yet approved or cleared by the [place]  FDA and has been authorized for detection and/or diagnosis of SARS-CoV-2 by FDA under an Emergency Use Authorization (EUA). This EUA will remain in effect (meaning this test can be used) for the duration of the COVID-19 declaration under Section 564(b)(1) of the Act, 21 U.S.C. section 360bbb-3(b)(1), unless the authorization is terminated or revoked.  Performed at [facility], [address] [place], [place] [address]     FURTHER DISCHARGE INSTRUCTIONS:  Get Medicines reviewed and adjusted: Please take  all your medications with you for your next visit with your Primary MD  Laboratory/radiological data: Please request your Primary MD to go over all hospital tests and procedure/radiological results at the follow up, please ask your Primary MD to get all Hospital records sent to his/her office.  In some cases, they will be blood work, cultures and biopsy results pending at the time of your discharge. Please request that your primary care M.D. goes through all the records of your hospital data and follows up on these results.  Also Note the  following: If you experience worsening of your admission symptoms, develop shortness of breath, life threatening emergency, suicidal or homicidal thoughts you must seek medical attention immediately by calling 911 or calling your MD immediately  if symptoms less severe.  You must read complete instructions/literature along with all the possible adverse reactions/side effects for all the Medicines you take and that have been prescribed to you. Take any new Medicines after you have completely understood and accpet all the possible adverse reactions/side effects.   Do not drive when taking Pain medications or sleeping medications (Benzodaizepines)  Do not take more than prescribed Pain, Sleep and Anxiety Medications. It is not advisable to combine anxiety,sleep and pain medications without talking with your primary care practitioner  Special Instructions: If you have smoked or chewed Tobacco  in the last 2 yrs please stop smoking, stop any regular Alcohol  and or any Recreational drug use.  Wear Seat belts while driving.  Please note: You were cared for by a hospitalist during your hospital stay. Once you are discharged, your primary care physician will handle any further medical issues. Please note that NO REFILLS for any discharge medications will be authorized once you are discharged, as it is imperative that you return to your primary care physician (or establish a relationship with a primary care physician if you do not have one) for your post hospital discharge needs so that they can reassess your need for medications and monitor your lab values.  Total Time spent coordinating discharge including counseling, education and face to face time equals greater than 30 minutes.  SignedBETHA Donalda Applebaum 11/11/2023 1:44 PM

## 2023-11-11 NOTE — Progress Notes (Signed)
 Pt going home today, AVS updated, included times of next dose due and added information/handouts on new medications. Discharge instructions reviewed with pt and his family at bedside.  Copy of instructions given to pt. Pt informed his scripts were sent to his pharmacy for pick up. Clarified for pt that his zyprexa /olanzapine  is for his hiccoughs, as this is the med he has been getting here for his hiccoughs which have improved, confirmed with Dr Raenelle.  Pt will be d/c'd via wheelchair with belongings and will be escorted by staff.   Janavia Rottman,RN SWOT

## 2023-11-11 NOTE — Progress Notes (Signed)
 Feels better-EGD with esophageal ulcers-no active bleeding Hiccups have essentially resolved per patient, he is requesting discharge and is keen on going home today. See discharge summaries for details

## 2023-11-11 NOTE — Anesthesia Postprocedure Evaluation (Signed)
 Anesthesia Post Note  Patient: Barry Nichols  Procedure(s) Performed: EGD (ESOPHAGOGASTRODUODENOSCOPY)     Patient location during evaluation: PACU Anesthesia Type: MAC Level of consciousness: awake and alert Pain management: pain level controlled Vital Signs Assessment: post-procedure vital signs reviewed and stable Respiratory status: spontaneous breathing, nonlabored ventilation and respiratory function stable Cardiovascular status: blood pressure returned to baseline Postop Assessment: no apparent nausea or vomiting Anesthetic complications: no   No notable events documented.  Last Vitals:  Vitals:   11/11/23 1228 11/11/23 1300  BP: (!) 146/39   Pulse: 92   Resp: (!) 28   Temp:  37.1 C  SpO2: 97%     Last Pain:  Vitals:   11/11/23 1300  TempSrc: Oral  PainSc:                  Vertell Row

## 2023-11-11 NOTE — Transfer of Care (Signed)
 Immediate Anesthesia Transfer of Care Note  Patient: Barry Nichols  Procedure(s) Performed: EGD (ESOPHAGOGASTRODUODENOSCOPY)  Patient Location: Endoscopy Unit  Anesthesia Type:MAC  Level of Consciousness: awake, alert , oriented, patient cooperative, and responds to stimulation  Airway & Oxygen Therapy: Patient Spontanous Breathing  Post-op Assessment: Report given to RN, Post -op Vital signs reviewed and stable, and Patient moving all extremities X 4  Post vital signs: Reviewed and stable  Last Vitals:  Vitals Value Taken Time  BP 127/52 12:05  Temp 97.6   Pulse 99   Resp 20   SpO2 100     Last Pain:  Vitals:   11/11/23 1029  TempSrc: Temporal  PainSc: 0-No pain      Patients Stated Pain Goal: 0 (11/11/23 0400)  Complications: No notable events documented.

## 2023-11-11 NOTE — Op Note (Signed)
 [facility] Patient Name: Barry Nichols Procedure Date : 11/11/2023 MRN: [id number] Attending MD: Gustav ALONSO Mcgee , MD, [id number] Date of Birth: Jan 11, 1976 CSN: [id number] Age: 48 Admit Type: Inpatient Procedure:                Upper GI endoscopy Indications:              Suspected upper gastrointestinal bleeding,                            Suspected reflux esophagitis, Melena, Eructation,                            Nausea Providers:                Gustav ALONSO Mcgee, MD, Particia Fischer, RN, Curtistine Bishop, Technician Referring MD:              Medicines:                Monitored Anesthesia Care Complications:            No immediate complications. Estimated Blood Loss:     Estimated blood loss was minimal. Procedure:                Pre-Anesthesia Assessment:                           - Prior to the procedure, a History and Physical                            was performed, and patient medications and                            allergies were reviewed. The patient's tolerance of                            previous anesthesia was also reviewed. The risks                            and benefits of the procedure and the sedation                            options and risks were discussed with the patient.                            All questions were answered, and informed consent                            was obtained. Prior Anticoagulants: The patient has                            taken no anticoagulant or antiplatelet agents. ASA                            Grade Assessment: III -  A patient with severe                            systemic disease. After reviewing the risks and                            benefits, the patient was deemed in satisfactory                            condition to undergo the procedure.                           After obtaining informed consent, the endoscope was                            passed under  direct vision. Throughout the                            procedure, the patient's blood pressure, pulse, and                            oxygen saturations were monitored continuously. The                            [id number]-[id number] ([id number]) Olympus endoscope was introduced                            through the mouth, and advanced to the second part                            of duodenum. The upper GI endoscopy was                            accomplished without difficulty. The patient                            tolerated the procedure well. Scope In: Scope Out: Findings:      Few linear and superficial esophageal ulcers with no bleeding and no       stigmata of recent bleeding were found 30 to 40 cm from the incisors.       The largest lesion was 10 mm in largest dimension. Biopsies were taken       with a cold forceps for histology.      LA Grade C (one or more mucosal breaks continuous between tops of 2 or       more mucosal folds, less than 75% circumference) esophagitis with no       bleeding was found 25 to 40 cm from the incisors.      Patchy mild inflammation characterized by congestion (edema) and       erythema was found in the entire examined stomach. Biopsies were taken       with a cold forceps for Helicobacter pylori testing.      The cardia and gastric fundus were normal on retroflexion.      The examined duodenum was normal. Impression:               -  Esophageal ulcers with no bleeding and no                            stigmata of recent bleeding. Biopsied.                           - LA Grade C reflux esophagitis with no bleeding.                           - Gastritis. Biopsied.                           - Normal examined duodenum. Recommendation:           - Resume previous diet.                           - Continue present medications.                           - Await pathology results.                           - No aspirin , ibuprofen , naproxen, or other                             non-steroidal anti-inflammatory drugs.                           - Follow an antireflux regimen.                           - Use Protonix  (pantoprazole ) 40 mg PO BID.                           - GI signing off, please call with any questions Procedure Code(s):        --- Professional ---                           [phone], Esophagogastroduodenoscopy, flexible,                            transoral; with biopsy, single or multiple Diagnosis Code(s):        --- Professional ---                           K22.10, Ulcer of esophagus without bleeding                           K21.00, Gastro-esophageal reflux disease with                            esophagitis, without bleeding                           K29.70, Gastritis, unspecified, without bleeding  K92.1, Melena (includes Hematochezia)                           R14.2, Eructation                           R11.0, Nausea CPT copyright 2022 American Medical Association. All rights reserved. The codes documented in this report are preliminary and upon coder review may  be revised to meet current compliance requirements. Barry Pease V. Cieanna Stormes, MD 11/11/2023 12:15:37 PM This report has been signed electronically. Number of Addenda: 0

## 2023-11-11 NOTE — Progress Notes (Addendum)
 Pt receives out-pt HD at [facility] on MWF. Pt arrives at 5:30 am for 5:45 am chair time. Will assist as needed.   Randine mungo Dialysis Navigator [phone]  Addendum at 2:15 pm: D/C order noted. Contacted [facility] to be advised of pt's d/c today and that pt should resume care tomorrow.

## 2023-11-11 NOTE — Anesthesia Preprocedure Evaluation (Addendum)
 Anesthesia Evaluation  Patient identified by MRN, date of birth, ID band Patient awake    Reviewed: Allergy & Precautions, NPO status , Patient's Chart, lab work & pertinent test results, reviewed documented beta blocker date and time   Airway Mallampati: II  TM Distance: >3 FB Neck ROM: Full    Dental  (+) Teeth Intact, Dental Advisory Given   Pulmonary neg pulmonary ROS   Pulmonary exam normal breath sounds clear to auscultation       Cardiovascular hypertension, Pt. on home beta blockers and Pt. on medications +CHF  Normal cardiovascular exam Rhythm:Regular Rate:Normal  TTE 2025 1. There is mid cavity acceleration of up to , unable to augment  with Valsalva. Left ventricular ejection fraction, by estimation, is 65 to  70%. The left ventricle has normal function. The left ventricle has no  regional wall motion abnormalities.  There is mild concentric left ventricular hypertrophy. Left ventricular  diastolic parameters are consistent with Grade I diastolic dysfunction  (impaired relaxation).   2. Right ventricular systolic function is normal. The right ventricular  size is normal. Tricuspid regurgitation signal is inadequate for assessing  PA pressure.   3. The mitral valve is normal in structure. Trivial mitral valve  regurgitation.   4. The aortic valve has an indeterminant number of cusps. Aortic valve  regurgitation is trivial.   5. The inferior vena cava is normal in size with greater than 50%  respiratory variability, suggesting right atrial pressure of 3 mmHg.     Neuro/Psych  Headaches  negative psych ROS   GI/Hepatic negative GI ROS, Neg liver ROS,,,  Endo/Other  diabetes, Type 2, Insulin  Dependent    Renal/GU ESRF and DialysisRenal disease (diaylsis MWF, last dialysis wednesday)Lab Results      Component                Value               Date                      NA                       135                  11/11/2023                CL                       96 (L)              11/11/2023                K                        4.1                 11/11/2023                CO2                      24                  11/11/2023                BUN  36 (H)              11/11/2023                CREATININE               9.39 (H)            11/11/2023                GFRNONAA                 6 (L)               11/11/2023                CALCIUM                  8.1 (L)             11/11/2023                PHOS                     6.5 (H)             11/11/2023                ALBUMIN                  1.8 (L)             11/11/2023                GLUCOSE                  161 (H)             11/11/2023             negative genitourinary   Musculoskeletal  (+) Arthritis ,    Abdominal   Peds  Hematology  (+) Blood dyscrasia, anemia Lab Results      Component                Value               Date                      WBC                      4.9                 11/11/2023                HGB                      7.5 (L)             11/11/2023                HCT                      23.0 (L)            11/11/2023                MCV                      87.8                11/11/2023  PLT                      312                 11/11/2023              Anesthesia Other Findings Patient is a 48 y.o.  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  Reproductive/Obstetrics                              Anesthesia Physical Anesthesia Plan  ASA: 3  Anesthesia Plan: MAC   Post-op Pain Management:    Induction: Intravenous  PONV Risk Score and Plan: Propofol  infusion and Treatment may vary due to age or medical condition  Airway Management Planned:  Natural Airway  Additional Equipment:   Intra-op Plan:   Post-operative Plan:   Informed Consent: I have reviewed the patients History and Physical, chart, labs and discussed the procedure including the risks, benefits and alternatives for the proposed anesthesia with the patient or authorized representative who has indicated his/her understanding and acceptance.     Dental advisory given  Plan Discussed with: CRNA  Anesthesia Plan Comments:          Anesthesia Quick Evaluation

## 2023-11-11 NOTE — Progress Notes (Signed)
 PROGRESS NOTE        PATIENT DETAILS Name: Barry Nichols Age: 48 y.o. Sex: male Date of Birth: 06-02-1975 Admit Date: 11/09/2023 Admitting Physician Micaela Speaker, MD ERE:Ndanmwz, Tari DASEN, PA-C  Brief Summary: Patient is a 48 y.o.  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  Significant events: 7/23>> admit to [facility]-Hb dropped post admission to 6.5-1 unit PRBC ordered.  Significant studies: 7/22>> CXR: No PNA 7/23>> CT chest: No acute intrathoracic pathology.  Significant microbiology data: None  Procedures: None  Consults: GI Renal  Subjective: Lying comfortably in bed-denies any chest pain or shortness of breath.  Hiccups have improved quite a bit-getting hiccusp every 4 hours.  Continues to have melanotic appearing stools.  Objective: Vitals: Blood pressure (!) 140/78, pulse (!) 216, temperature 98.6 F (37 C), temperature source Oral, resp. rate 18, height 5' 10 (1.778 m), weight 100.2 kg, SpO2 96%.   Exam: Gen Exam:Alert awake-not in any distress HEENT:atraumatic, normocephalic Chest: B/L clear to auscultation anteriorly CVS:S1S2 regular Abdomen:soft non tender, non distended Extremities:no edema Neurology: Non focal Skin: no rash  Pertinent Labs/Radiology:    Latest Ref Rng & Units 11/11/2023    7:43 AM 11/10/2023    5:30 PM 11/09/2023    6:13 PM  CBC  WBC 4.0 - 10.5 K/uL 4.9  5.1  6.8   Hemoglobin 13.0 - 17.0 g/dL 7.5  6.5  7.6   Hematocrit 39.0 - 52.0 % 23.0  20.1  23.4   Platelets 150 - 400 K/uL 312  312  324     Lab Results  Component Value Date   NA 134 (L) 11/10/2023   K 3.7 11/10/2023   CL 92 (L) 11/10/2023   CO2 24 11/10/2023     Assessment/Plan: Intractable hiccups Unclear etiology but given nausea/vomiting/black tarry  stools-concerned that he may have peptic ulcer disease Improved overnight No response to oral Thorazine  which he was on prior to this hospitalization. Seems to have gotten better after initiation of olanzapine . If worsens-will challenge with Reglan /Neurontin .  Upper GI bleed with acute blood loss anemia Continues to have black stools-Hb dropped to 6.5 on 7/23 requiring 1 unit of PRBC Continue to monitor CBC IV PPI EGD later today GI following. Note-recent colonoscopy on 7/1 at [facility]-polyp in cecum/sigmoid colon -repeat colonoscopy x 3 years.  Elevated lipase No clinical evidence or radiological evidence of pancreatitis on CT abdomen done on 7/17. Ongoing supportive care.  ESRD on HD MWF Nephrology following  History of failed renal transplantation Now on HD Remains on immunosuppressive's (prednisone /Prograf ) On prophylactic Bactrim .  Normocytic anemia Secondary to ESRD Iron/iron per nephrology.  Sinus tachycardia Appreciate cardiology input-thought to be multifactorial-due to acute illness/anemia/hiccups. Continue metoprolol .  Chronic HFpEF Euvolemic Volume removal with hemodialysis.  Mild to moderate MR/TR Outpatient cardiology follow-up.  HLD Fenofibrate held per cardiology recommendation Continue Vascepa   DM-2 (A1c 9.3 on 7/23) CBGs stable Semglee  30 units daily+ SSI Follow/Optimize  Recent Labs    11/10/23 1645 11/10/23 2231 11/11/23 0803  GLUCAP 128* 124* 157*    Class 1 Obesity Estimated body mass index is 31.7 kg/m as calculated from the following:   Height as of this encounter: 5' 10 (1.778 m).   Weight as  of this encounter: 100.2 kg.   Code status:   Code Status: Full Code   DVT Prophylaxis: heparin  injection 5,000 Units Start: 11/10/23 1400   Family Communication: None at bedside   Disposition Plan: Status is: Inpatient Remains inpatient appropriate because: Severity of illness   Planned Discharge  Destination:Home   Diet: Diet Order             Diet NPO time specified  Diet effective now                     Antimicrobial agents: Anti-infectives (From admission, onward)    Start     Dose/Rate Route Frequency Ordered Stop   11/10/23 1430  sulfamethoxazole -trimethoprim  (BACTRIM ) 400-80 MG per tablet 1 tablet        1 tablet Oral Every M-W-F 11/10/23 1332          MEDICATIONS: Scheduled Meds:  sodium chloride    Intravenous Once   Chlorhexidine  Gluconate Cloth  6 each Topical Q0600   cinacalcet   30 mg Oral Daily   folic acid   1 mg Oral Daily   heparin   5,000 Units Subcutaneous Q8H   icosapent  Ethyl  2 g Oral BID   insulin  aspart  0-5 Units Subcutaneous QHS   insulin  aspart  0-6 Units Subcutaneous TID WC   insulin  glargine-yfgn  30 Units Subcutaneous QHS   metoprolol  succinate  25 mg Oral QPM   OLANZapine   5 mg Oral Daily   ondansetron  (ZOFRAN ) IV  4 mg Intravenous Q8H   pantoprazole  (PROTONIX ) IV  40 mg Intravenous Q12H   predniSONE   5 mg Oral Daily   sodium bicarbonate   1,300 mg Oral TID   sodium chloride  flush  3 mL Intravenous Q12H   sulfamethoxazole -trimethoprim   1 tablet Oral Q M,W,F   tacrolimus   3 mg Oral q AM   And   tacrolimus   2 mg Oral QHS   traZODone   50 mg Oral QHS   Continuous Infusions:  promethazine  (PHENERGAN ) injection (IM or IVPB) Stopped (11/10/23 1148)   PRN Meds:.acetaminophen , promethazine  (PHENERGAN ) injection (IM or IVPB)   I have personally reviewed following labs and imaging studies  LABORATORY DATA: CBC: Recent Labs  Lab 11/04/23 2146 11/06/23 2309 11/09/23 1813 11/10/23 1730 11/11/23 0743  WBC 9.3 6.5 6.8 5.1 4.9  NEUTROABS  --  4.2 4.3  --   --   HGB 9.3* 8.1* 7.6* 6.5* 7.5*  HCT 28.3* 25.2* 23.4* 20.1* 23.0*  MCV 86.8 87.8 87.3 88.2 87.8  PLT 322 247 324 312 312    Basic Metabolic Panel: Recent Labs  Lab 11/04/23 2146 11/06/23 2309 11/09/23 1813 11/10/23 1730  NA 132* 135 135 134*  K 4.5 4.7 4.1 3.7   CL 87* 87* 89* 92*  CO2 24 26 26 24   GLUCOSE 202* 142* 117* 127*  BUN 50* 52* 53* 67*  CREATININE 12.20* 12.90* 12.70* 14.75*  CALCIUM 10.0 9.7 9.7 8.6*  PHOS  --   --   --  9.0*    GFR: Estimated Creatinine Clearance: 7.3 mL/min (A) (by C-G formula based on SCr of 14.75 mg/dL (H)).  Liver Function Tests: Recent Labs  Lab 11/04/23 2146 11/06/23 2309 11/09/23 1813 11/10/23 1730  AST 49* 52* 66*  --   ALT 77* 72* 80*  --   ALKPHOS 111 99 96  --   BILITOT 0.6 0.6 0.5  --   PROT 8.3* 7.9 8.0  --   ALBUMIN 3.7 3.4* 3.3* 1.9*  Recent Labs  Lab 11/04/23 2146 11/06/23 2309 11/09/23 1813  LIPASE 455* 145* 211*   No results for input(s): AMMONIA in the last 168 hours.  Coagulation Profile: No results for input(s): INR, PROTIME in the last 168 hours.  Cardiac Enzymes: No results for input(s): CKTOTAL, CKMB, CKMBINDEX, TROPONINI in the last 168 hours.  BNP (last 3 results) No results for input(s): PROBNP in the last 8760 hours.  Lipid Profile: No results for input(s): CHOL, HDL, LDLCALC, TRIG, CHOLHDL, LDLDIRECT in the last 72 hours.  Thyroid Function Tests: No results for input(s): TSH, T4TOTAL, FREET4, T3FREE, THYROIDAB in the last 72 hours.  Anemia Panel: No results for input(s): VITAMINB12, FOLATE, FERRITIN, TIBC, IRON, RETICCTPCT in the last 72 hours.  Urine analysis:    Component Value Date/Time   COLORURINE YELLOW 05/12/2023 0312   APPEARANCEUR CLEAR 05/12/2023 0312   LABSPEC 1.010 05/12/2023 0312   PHURINE 5.0 05/12/2023 0312   GLUCOSEU 150 (A) 05/12/2023 0312   HGBUR LARGE (A) 05/12/2023 0312   BILIRUBINUR NEGATIVE 05/12/2023 0312   BILIRUBINUR neg 09/24/2012 1059   KETONESUR NEGATIVE 05/12/2023 0312   PROTEINUR 100 (A) 05/12/2023 0312   UROBILINOGEN 0.2 09/24/2012 1059   UROBILINOGEN 0.2 09/11/2011 0843   NITRITE NEGATIVE 05/12/2023 0312   LEUKOCYTESUR NEGATIVE 05/12/2023 9687    Sepsis  Labs: Lactic Acid, Venous    Component Value Date/Time   LATICACIDVEN 1.0 11/10/2023 0429    MICROBIOLOGY: Recent Results (from the past 240 hours)  Resp panel by RT-PCR (RSV, Flu A&B, Covid) Anterior Nasal Swab     Status: None   Collection Time: 11/04/23  9:46 PM   Specimen: Anterior Nasal Swab  Result Value Ref Range Status   SARS Coronavirus 2 by RT PCR NEGATIVE NEGATIVE Final    Comment: (NOTE) SARS-CoV-2 target nucleic acids are NOT DETECTED.  The SARS-CoV-2 RNA is generally detectable in upper respiratory specimens during the acute phase of infection. The lowest concentration of SARS-CoV-2 viral copies this assay can detect is 138 copies/mL. A negative result does not preclude SARS-Cov-2 infection and should not be used as the sole basis for treatment or other patient management decisions. A negative result may occur with  improper specimen collection/handling, submission of specimen other than nasopharyngeal swab, presence of viral mutation(s) within the areas targeted by this assay, and inadequate number of viral copies(<138 copies/mL). A negative result must be combined with clinical observations, patient history, and epidemiological information. The expected result is Negative.  Fact Sheet for Patients:  BloggerCourse.com  Fact Sheet for Healthcare Providers:  SeriousBroker.it  This test is no t yet approved or cleared by the [place]  FDA and  has been authorized for detection and/or diagnosis of SARS-CoV-2 by FDA under an Emergency Use Authorization (EUA). This EUA will remain  in effect (meaning this test can be used) for the duration of the COVID-19 declaration under Section 564(b)(1) of the Act, 21 U.S.C.section 360bbb-3(b)(1), unless the authorization is terminated  or revoked sooner.       Influenza A by PCR NEGATIVE NEGATIVE Final   Influenza B by PCR NEGATIVE NEGATIVE Final    Comment: (NOTE) The  Xpert Xpress SARS-CoV-2/FLU/RSV plus assay is intended as an aid in the diagnosis of influenza from Nasopharyngeal swab specimens and should not be used as a sole basis for treatment. Nasal washings and aspirates are unacceptable for Xpert Xpress SARS-CoV-2/FLU/RSV testing.  Fact Sheet for Patients: BloggerCourse.com  Fact Sheet for Healthcare Providers: SeriousBroker.it  This test is not yet  approved or cleared by the [place]  FDA and has been authorized for detection and/or diagnosis of SARS-CoV-2 by FDA under an Emergency Use Authorization (EUA). This EUA will remain in effect (meaning this test can be used) for the duration of the COVID-19 declaration under Section 564(b)(1) of the Act, 21 U.S.C. section 360bbb-3(b)(1), unless the authorization is terminated or revoked.     Resp Syncytial Virus by PCR NEGATIVE NEGATIVE Final    Comment: (NOTE) Fact Sheet for Patients: BloggerCourse.com  Fact Sheet for Healthcare Providers: SeriousBroker.it  This test is not yet approved or cleared by the [place]  FDA and has been authorized for detection and/or diagnosis of SARS-CoV-2 by FDA under an Emergency Use Authorization (EUA). This EUA will remain in effect (meaning this test can be used) for the duration of the COVID-19 declaration under Section 564(b)(1) of the Act, 21 U.S.C. section 360bbb-3(b)(1), unless the authorization is terminated or revoked.  Performed at [facility], [address] [place], [place] [address]     RADIOLOGY STUDIES/RESULTS: CT CHEST WO CONTRAST Result Date: 11/10/2023 CLINICAL DATA:  Persistent cough. EXAM: CT CHEST WITHOUT CONTRAST TECHNIQUE: Multidetector CT imaging of the chest was performed following the standard protocol without IV contrast. RADIATION DOSE REDUCTION: This exam was performed according to the departmental  dose-optimization program which includes automated exposure control, adjustment of the mA and/or kV according to patient size and/or use of iterative reconstruction technique. COMPARISON:  Chest radiograph dated 11/09/2023. FINDINGS: Evaluation of this exam is limited in the absence of intravenous contrast. Cardiovascular: There is no cardiomegaly or pericardial effusion. There is coronary vascular calcification. Mild atherosclerotic calcification of the thoracic aorta. No intimal dilatation. The central pulmonary arteries are grossly unremarkable. Mediastinum/Nodes: No hilar or mediastinal adenopathy. The esophagus is grossly unremarkable. A 1.3 cm left thyroid calcified nodule. No mediastinal fluid collection. Lungs/Pleura: No focal consolidation, pleural effusion, pneumothorax. The central airways are patent. Upper Abdomen: No acute abnormality. Musculoskeletal: No acute osseous pathology. IMPRESSION: 1. No acute intrathoracic pathology. 2. Coronary vascular calcification. 3.  Aortic Atherosclerosis (ICD10-I70.0). Electronically Signed   By: Vanetta Chou M.D.   On: 11/10/2023 17:22   ECHOCARDIOGRAM COMPLETE Result Date: 11/10/2023    ECHOCARDIOGRAM REPORT   Patient Name:   Barry Nichols Date of Exam: 11/10/2023 Medical Rec #:  [id number]           Height:       70.0 in Accession #:    [id number]          Weight:       217.4 lb Date of Birth:  Dec 17, 1975           BSA:          2.163 m Patient Age:    48 years            BP:           132/89 mmHg Patient Gender: M                   HR:           105 bpm. Exam Location:  Inpatient Procedure: 2D Echo, Cardiac Doppler and Color Doppler (Both Spectral and Color            Flow Doppler were utilized during procedure). Indications:    Elevated Troponins  History:        Patient has no prior history of Echocardiogram examinations.  ESRD; Risk Factors:Hypertension and Diabetes.  Sonographer:    Damien Senior RDCS Referring Phys: SUBRINA SUNDIL  IMPRESSIONS  1. There is mid cavity acceleration of up to , unable to augment with Valsalva. Left ventricular ejection fraction, by estimation, is 65 to 70%. The left ventricle has normal function. The left ventricle has no regional wall motion abnormalities. There is mild concentric left ventricular hypertrophy. Left ventricular diastolic parameters are consistent with Grade I diastolic dysfunction (impaired relaxation).  2. Right ventricular systolic function is normal. The right ventricular size is normal. Tricuspid regurgitation signal is inadequate for assessing PA pressure.  3. The mitral valve is normal in structure. Trivial mitral valve regurgitation.  4. The aortic valve has an indeterminant number of cusps. Aortic valve regurgitation is trivial.  5. The inferior vena cava is normal in size with greater than 50% respiratory variability, suggesting right atrial pressure of 3 mmHg. FINDINGS  Left Ventricle: There is mid cavity acceleration of up to , unable to augment with Valsalva. Left ventricular ejection fraction, by estimation, is 65 to 70%. The left ventricle has normal function. The left ventricle has no regional wall motion abnormalities. The left ventricular internal cavity size was normal in size. There is mild concentric left ventricular hypertrophy. Left ventricular diastolic parameters are consistent with Grade I diastolic dysfunction (impaired relaxation). Right Ventricle: The right ventricular size is normal. No increase in right ventricular wall thickness. Right ventricular systolic function is normal. Tricuspid regurgitation signal is inadequate for assessing PA pressure. Left [facility]: Left atrial size was normal in size. Right [facility]: Right atrial size was normal in size. Pericardium: There is no evidence of pericardial effusion. Mitral Valve: The mitral valve is normal in structure. Trivial mitral valve regurgitation. Tricuspid Valve: The tricuspid valve is normal in structure.  Tricuspid valve regurgitation is trivial. Aortic Valve: The aortic valve has an indeterminant number of cusps. Aortic valve regurgitation is trivial. Pulmonic Valve: The pulmonic valve was normal in structure. Pulmonic valve regurgitation is trivial. Aorta: The aortic root and ascending aorta are structurally normal, with no evidence of dilitation. Venous: The inferior vena cava is normal in size with greater than 50% respiratory variability, suggesting right atrial pressure of 3 mmHg. IAS/Shunts: No atrial level shunt detected by color flow Doppler.  LEFT VENTRICLE PLAX 2D LVIDd:         4.60 cm   Diastology LVIDs:         3.20 cm   LV e' medial:    5.77 cm/s LV PW:         1.30 cm   LV E/e' medial:  10.8 LV IVS:        1.30 cm   LV e' lateral:   10.40 cm/s LVOT diam:     2.30 cm   LV E/e' lateral: 6.0 LV SV:         67 LV SV Index:   31 LVOT Area:     4.15 cm  RIGHT VENTRICLE RV S prime:     22.60 cm/s TAPSE (M-mode): 2.3 cm LEFT ATRIUM             Index        RIGHT ATRIUM           Index LA diam:        3.50 cm 1.62 cm/m   RA Area:     12.00 cm LA Vol (A2C):   38.9 ml 17.99 ml/m  RA Volume:   24.10 ml  11.14 ml/m  LA Vol (A4C):   31.4 ml 14.52 ml/m LA Biplane Vol: 37.2 ml 17.20 ml/m  AORTIC VALVE LVOT Vmax:   112.00 cm/s LVOT Vmean:  85.500 cm/s LVOT VTI:    0.162 m  AORTA Ao Root diam: 3.40 cm Ao Asc diam:  3.30 cm MITRAL VALVE MV Area (PHT): 3.76 cm    SHUNTS MV Decel Time: 202 msec    Systemic VTI:  0.16 m MV E velocity: 62.30 cm/s  Systemic Diam: 2.30 cm MV A velocity: 87.60 cm/s MV E/A ratio:  0.71 Morene Brownie Electronically signed by Morene Brownie Signature Date/Time: 11/10/2023/12:39:26 PM    Final    DG Chest Portable 1 View Result Date: 11/09/2023 CLINICAL DATA:  SOB EXAM: PORTABLE CHEST 1 VIEW COMPARISON:  Chest x-ray 11/09/2023 FINDINGS: Prominence of the cardiac silhouette likely due to AP portable technique and low lung volumes. The heart and mediastinal contours unchanged.  Atherosclerotic plaque Low lung volumes. No focal consolidation. No pulmonary edema. No pleural effusion. No pneumothorax. No acute osseous abnormality. IMPRESSION: Low lung volumes with no active disease. Electronically Signed   By: Morgane  Naveau M.D.   On: 11/09/2023 19:21     LOS: 1 day   Donalda Applebaum, MD  [facility]    To contact the attending provider between 7A-7P or the covering provider during after hours 7P-7A, please log into the web site [email or web address] and access using universal [place] password for that web site. If you do not have the password, please call the hospital operator.  11/11/2023, 9:27 AM

## 2023-11-11 NOTE — Progress Notes (Signed)
 [facility] Progress Note   Subjective:    Seen and examined at bedside. Tolerated HD overnight with net UF 1L. S/p 1 unit PRBCs overnight as well for Hgb 6.5. GI and Cardiology were consulted. S/p EGD yesterday. Feeling well today with no acute complaints. Noted plan for discharge today.  Objective Vitals:   11/11/23 1220 11/11/23 1228 11/11/23 1230 11/11/23 1300  BP: (!) 134/55 (!) 146/39 130/62   Pulse: 96 92 87   Resp: (!) 22 (!) 28 20   Temp:    98.7 F (37.1 C)  TempSrc:    Oral  SpO2: 98% 97% 97%   Weight:      Height:       Physical Exam General: Awake, alert, NAD Heart: S1 and S2; No murmurs, gallops, or rubs Lungs: Clear thoughout Abdomen: Soft and non-tender Extremities: No LE edema Dialysis Access: L AVF    Filed Weights   11/09/23 2321 11/10/23 0235 11/10/23 1725  Weight: 97.9 kg 98.6 kg 100.2 kg    Intake/Output Summary (Last 24 hours) at 11/11/2023 1514 Last data filed at 11/11/2023 1201 Gross per 24 hour  Intake 1011.98 ml  Output 1000 ml  Net 11.98 ml    Additional Objective Labs: Basic Metabolic Panel: Recent Labs  Lab 11/09/23 1813 11/10/23 1730 11/11/23 0743  NA 135 134* 135  K 4.1 3.7 4.1  CL 89* 92* 96*  CO2 26 24 24   GLUCOSE 117* 127* 161*  BUN 53* 67* 36*  CREATININE 12.70* 14.75* 9.39*  CALCIUM 9.7 8.6* 8.1*  PHOS  --  9.0* 6.5*   Liver Function Tests: Recent Labs  Lab 11/04/23 2146 11/06/23 2309 11/09/23 1813 11/10/23 1730 11/11/23 0743  AST 49* 52* 66*  --   --   ALT 77* 72* 80*  --   --   ALKPHOS 111 99 96  --   --   BILITOT 0.6 0.6 0.5  --   --   PROT 8.3* 7.9 8.0  --   --   ALBUMIN 3.7 3.4* 3.3* 1.9* 1.8*   Recent Labs  Lab 11/04/23 2146 11/06/23 2309 11/09/23 1813  LIPASE 455* 145* 211*   CBC: Recent Labs  Lab 11/04/23 2146 11/06/23 2309 11/09/23 1813 11/10/23 1730 11/11/23 0743  WBC 9.3 6.5 6.8 5.1 4.9  NEUTROABS  --  4.2 4.3  --   --   HGB 9.3* 8.1* 7.6* 6.5* 7.5*  HCT 28.3*  25.2* 23.4* 20.1* 23.0*  MCV 86.8 87.8 87.3 88.2 87.8  PLT 322 247 324 312 312   Blood Culture    Component Value Date/Time   SDES URINE, RANDOM 05/12/2023 0312   SPECREQUEST NONE Reflexed from [id number] 05/12/2023 0312   CULT (A) 05/12/2023 0312    <10,000 COLONIES/mL INSIGNIFICANT GROWTH Performed at [facility], [address], [place], [place] [address]    REPTSTATUS 05/14/2023 FINAL 05/12/2023 9687    Cardiac Enzymes: No results for input(s): CKTOTAL, CKMB, CKMBINDEX, TROPONINI in the last 168 hours. CBG: Recent Labs  Lab 11/10/23 1645 11/10/23 2231 11/11/23 0803 11/11/23 1037 11/11/23 1251  GLUCAP 128* 124* 157* 115* 90   Iron Studies: No results for input(s): IRON, TIBC, TRANSFERRIN, FERRITIN in the last 72 hours. Lab Results  Component Value Date   INR 0.93 07/13/2012   Studies/Results: CT CHEST WO CONTRAST Result Date: 11/10/2023 CLINICAL DATA:  Persistent cough. EXAM: CT CHEST WITHOUT CONTRAST TECHNIQUE: Multidetector CT imaging of the chest was performed following the standard protocol without IV  contrast. RADIATION DOSE REDUCTION: This exam was performed according to the departmental dose-optimization program which includes automated exposure control, adjustment of the mA and/or kV according to patient size and/or use of iterative reconstruction technique. COMPARISON:  Chest radiograph dated 11/09/2023. FINDINGS: Evaluation of this exam is limited in the absence of intravenous contrast. Cardiovascular: There is no cardiomegaly or pericardial effusion. There is coronary vascular calcification. Mild atherosclerotic calcification of the thoracic aorta. No intimal dilatation. The central pulmonary arteries are grossly unremarkable. Mediastinum/Nodes: No hilar or mediastinal adenopathy. The esophagus is grossly unremarkable. A 1.3 cm left thyroid calcified nodule. No mediastinal fluid collection. Lungs/Pleura: No focal consolidation, pleural effusion,  pneumothorax. The central airways are patent. Upper Abdomen: No acute abnormality. Musculoskeletal: No acute osseous pathology. IMPRESSION: 1. No acute intrathoracic pathology. 2. Coronary vascular calcification. 3.  Aortic Atherosclerosis (ICD10-I70.0). Electronically Signed   By: Vanetta Chou M.D.   On: 11/10/2023 17:22   ECHOCARDIOGRAM COMPLETE Result Date: 11/10/2023    ECHOCARDIOGRAM REPORT   Patient Name:   Barry Nichols Date of Exam: 11/10/2023 Medical Rec #:  [id number]           Height:       70.0 in Accession #:    [id number]          Weight:       217.4 lb Date of Birth:  02/17/1976           BSA:          2.163 m Patient Age:    48 years            BP:           132/89 mmHg Patient Gender: M                   HR:           105 bpm. Exam Location:  Inpatient Procedure: 2D Echo, Cardiac Doppler and Color Doppler (Both Spectral and Color            Flow Doppler were utilized during procedure). Indications:    Elevated Troponins  History:        Patient has no prior history of Echocardiogram examinations.                 ESRD; Risk Factors:Hypertension and Diabetes.  Sonographer:    Damien Senior RDCS Referring Phys: SUBRINA SUNDIL IMPRESSIONS  1. There is mid cavity acceleration of up to , unable to augment with Valsalva. Left ventricular ejection fraction, by estimation, is 65 to 70%. The left ventricle has normal function. The left ventricle has no regional wall motion abnormalities. There is mild concentric left ventricular hypertrophy. Left ventricular diastolic parameters are consistent with Grade I diastolic dysfunction (impaired relaxation).  2. Right ventricular systolic function is normal. The right ventricular size is normal. Tricuspid regurgitation signal is inadequate for assessing PA pressure.  3. The mitral valve is normal in structure. Trivial mitral valve regurgitation.  4. The aortic valve has an indeterminant number of cusps. Aortic valve regurgitation is trivial.  5. The  inferior vena cava is normal in size with greater than 50% respiratory variability, suggesting right atrial pressure of 3 mmHg. FINDINGS  Left Ventricle: There is mid cavity acceleration of up to , unable to augment with Valsalva. Left ventricular ejection fraction, by estimation, is 65 to 70%. The left ventricle has normal function. The left ventricle has no regional wall motion abnormalities. The left ventricular internal cavity size was  normal in size. There is mild concentric left ventricular hypertrophy. Left ventricular diastolic parameters are consistent with Grade I diastolic dysfunction (impaired relaxation). Right Ventricle: The right ventricular size is normal. No increase in right ventricular wall thickness. Right ventricular systolic function is normal. Tricuspid regurgitation signal is inadequate for assessing PA pressure. Left Atrium: Left atrial size was normal in size. Right Atrium: Right atrial size was normal in size. Pericardium: There is no evidence of pericardial effusion. Mitral Valve: The mitral valve is normal in structure. Trivial mitral valve regurgitation. Tricuspid Valve: The tricuspid valve is normal in structure. Tricuspid valve regurgitation is trivial. Aortic Valve: The aortic valve has an indeterminant number of cusps. Aortic valve regurgitation is trivial. Pulmonic Valve: The pulmonic valve was normal in structure. Pulmonic valve regurgitation is trivial. Aorta: The aortic root and ascending aorta are structurally normal, with no evidence of dilitation. Venous: The inferior vena cava is normal in size with greater than 50% respiratory variability, suggesting right atrial pressure of 3 mmHg. IAS/Shunts: No atrial level shunt detected by color flow Doppler.  LEFT VENTRICLE PLAX 2D LVIDd:         4.60 cm   Diastology LVIDs:         3.20 cm   LV e' medial:    5.77 cm/s LV PW:         1.30 cm   LV E/e' medial:  10.8 LV IVS:        1.30 cm   LV e' lateral:   10.40 cm/s LVOT diam:      2.30 cm   LV E/e' lateral: 6.0 LV SV:         67 LV SV Index:   31 LVOT Area:     4.15 cm  RIGHT VENTRICLE RV S prime:     22.60 cm/s TAPSE (M-mode): 2.3 cm LEFT ATRIUM             Index        RIGHT ATRIUM           Index LA diam:        3.50 cm 1.62 cm/m   RA Area:     12.00 cm LA Vol (A2C):   38.9 ml 17.99 ml/m  RA Volume:   24.10 ml  11.14 ml/m LA Vol (A4C):   31.4 ml 14.52 ml/m LA Biplane Vol: 37.2 ml 17.20 ml/m  AORTIC VALVE LVOT Vmax:   112.00 cm/s LVOT Vmean:  85.500 cm/s LVOT VTI:    0.162 m  AORTA Ao Root diam: 3.40 cm Ao Asc diam:  3.30 cm MITRAL VALVE MV Area (PHT): 3.76 cm    SHUNTS MV Decel Time: 202 msec    Systemic VTI:  0.16 m MV E velocity: 62.30 cm/s  Systemic Diam: 2.30 cm MV A velocity: 87.60 cm/s MV E/A ratio:  0.71 Morene Brownie Electronically signed by Morene Brownie Signature Date/Time: 11/10/2023/12:39:26 PM    Final    DG Chest Portable 1 View Result Date: 11/09/2023 CLINICAL DATA:  SOB EXAM: PORTABLE CHEST 1 VIEW COMPARISON:  Chest x-ray 11/09/2023 FINDINGS: Prominence of the cardiac silhouette likely due to AP portable technique and low lung volumes. The heart and mediastinal contours unchanged. Atherosclerotic plaque Low lung volumes. No focal consolidation. No pulmonary edema. No pleural effusion. No pneumothorax. No acute osseous abnormality. IMPRESSION: Low lung volumes with no active disease. Electronically Signed   By: Morgane  Naveau M.D.   On: 11/09/2023 19:21    Medications:  promethazine  (PHENERGAN )  injection (IM or IVPB) Stopped (11/10/23 1148)    Chlorhexidine  Gluconate Cloth  6 each Topical Q0600   cinacalcet   30 mg Oral Daily   folic acid   1 mg Oral Daily   heparin   5,000 Units Subcutaneous Q8H   icosapent  Ethyl  2 g Oral BID   insulin  aspart  0-5 Units Subcutaneous QHS   insulin  aspart  0-6 Units Subcutaneous TID WC   insulin  glargine-yfgn  30 Units Subcutaneous QHS   metoCLOPramide   10 mg Oral Once   metoprolol  succinate  25 mg Oral QPM    OLANZapine   5 mg Oral Daily   ondansetron  (ZOFRAN ) IV  4 mg Intravenous Q8H   pantoprazole   40 mg Oral BID AC   predniSONE   5 mg Oral Daily   sodium bicarbonate   1,300 mg Oral TID   sodium chloride  flush  3 mL Intravenous Q12H   sulfamethoxazole -trimethoprim   1 tablet Oral Q M,W,F   tacrolimus   3 mg Oral q AM   And   tacrolimus   2 mg Oral QHS   traZODone   50 mg Oral QHS    Dialysis Orders: [facility]WF [facility] 4h  B400  97.5kg  2K bath  AVF   Heparin  2000 Last OP HD 7/21, post wt 97.2kg Very low wt gains, < 1 kg  CXR 7/22 - no active disease BP 130/90, HR 100, RR 18, afeb Na 135  K 4.1   BUN 53  creat 12.7   lipase 211 tbili 0.5   alb 3.3 Wbc 6K   Hb 7.6    Assessment/Plan: Hiccups/ nausea/ vomiting: GI following. S/p EGD 7/24 ESRD: on HD MWF. Last HD Monday. Received HD overnight. Next HD 7/25 in outpatient. HTN: Bp's stable here, cont home meds as needed.  Volume: no vol excess on exam, coming off just under dry wt recently. Will prob need dry wt lowered.  Anemia of esrd: Hb 7- 9 here, follow, transfuse prn.  IDDM Dispo - Okay for discharge from a renal standpoint  Charmaine Piety, NP [facility] 11/11/2023,3:14 PM  LOS: 1 day

## 2023-11-11 NOTE — Progress Notes (Signed)
 AVS completed; printed and placed with chart.  Patient talking with provider about discharging tomorrow 11/12/23. RN will follow up.

## 2023-11-11 NOTE — Interval H&P Note (Signed)
 History and Physical Interval Note:  11/11/2023 11:17 AM  Barry Nichols  has presented today for surgery, with the diagnosis of vomiting, weight loss, hiccups, black stool.  The various methods of treatment have been discussed with the patient and family. After consideration of risks, benefits and other options for treatment, the patient has consented to  Procedure(s): EGD (ESOPHAGOGASTRODUODENOSCOPY) (N/A) as a surgical intervention.  The patient's history has been reviewed, patient examined, no change in status, stable for surgery.  I have reviewed the patient's chart and labs.  Questions were answered to the patient's satisfaction.     Adaly Puder

## 2023-11-12 LAB — SURGICAL PATHOLOGY

## 2023-11-12 NOTE — Discharge Planning (Signed)
 [facility] Patient Discharge Orders- [facility] CLINIC: [facility]  Patient's name: Barry Nichols Admit/DC Dates: 11/09/2023 - 11/11/2023  Discharge Diagnoses: Intractable hiccups - concern for PUD; EGD from 7/24 showed esophageal ulcers; biopsy pending-follow-up on results!  UGIB - EGD (-) bleeding but showed esophageal ulcers (see above); continue PPI; re-check CBC in 1 week Elevated lipase -  No clinical evidence or radiological evidence of pancreatitis on CT abdomen done on 7/17. Continue supportive care  Aranesp: Given: No     Last Hgb: 7.5 PRBC's Given: Yes  Date/# of units: 1 unit PRBCs 7/24 ESA dose for discharge: mircera 225 mcg IV q 2 weeks  IV Iron dose at discharge: n/a  Heparin  change: No  EDW Change: No   Bath Change: No  Access intervention/Change: No  Hectorol change: No  Discharge Labs: Calcium 8.1  Phosphorus 6.5  Albumin 1.8  K+ 4.1  IV Antibiotics: No  On Coumadin?: No   OTHER/APPTS/LAB ORDERS: Follow-up on biopsy results from EGD 7/24 Obtain CBC pre-HD in 1 week    D/C Meds to be reconciled by nurse after every discharge.  Completed By: Charmaine Piety, NP   Reviewed by: MD:______ RN_______

## 2023-11-13 ENCOUNTER — Encounter (HOSPITAL_COMMUNITY): Payer: Self-pay | Admitting: Gastroenterology

## 2023-11-13 ENCOUNTER — Telehealth: Payer: Self-pay | Admitting: Nurse Practitioner

## 2023-11-13 NOTE — Telephone Encounter (Signed)
 Tcm call successful: I was able to call Barry Nichols and remind him of his regularly schedule HD. He plans on attending. PHD

## 2023-11-18 NOTE — Progress Notes (Signed)
 Cardiology Clinic Note  [facility] - [address] [place] [address] Dept: [phone]  REQUESTING PHYSICIAN: [name], PA-C  REASON FOR CONSULTATION:  Chief Complaint  Patient presents with  . Consult    Portion of this note were dictated using DRAGON voices recognition software. Please disregard any errors in transcription .  ASSESSMENT AND PLAN: Sinus tachycardia, secondary to hiccups and gastric ulcers -unable to keep food/water down in the past week -hiccups constantly which triggers vomiting -baseline anemic as well and had 1 PRBC transfusion while at [facility] last week -he will see gastroenterologist today -advised to try chlorpromazine  for hiccups  History of cardiomyopathy  in 2016 -resume Coreg  at 12.5mg  BID  HTN, improving -taken off of amlodipine  and Clonidine  recently due to hypotension at dialysis  -RTC PRN -The above plan was discussed with the patient in detail. He expresses understanding and agrees with the plan.  Thank you for the opportunity of assisting in the care of Barry Alexander Prevost Jr..  Please do not hesitate to call if you have any questions.  Sincerely,   Lennart RAMAN. Debora, MD Cardiology  [facility] 11/18/2023  9:57 AM   HISTORY AND PRESENT ILLNESS:    Barry Tebbetts. is a very pleasant 48 year old male with PMHx of uncontrolled IDDM2, ESRD on HD, s/p kidney transplant in 05/2021, with rejection in 05/2023, HTN, anemia of chronic disease, HLD, prior cardiomyopathy in 2016 with recovered ejection fraction, prior smoker, esophageal ulcers, recent acute blood loss anemia requiring pRBC transfusion. He is referred after ER visit at [facility] due to persistent hiccups and vomiting after routine colonoscopy. He has not been able to keep solids or liquids down for the past week. He notes while at [facility], he received 1 PRBC transfusion. He states any food or water  will trigger the hiccups again and make him vomit. He is going to see gastroenterologist today after this visit. He is referred due cardiology due to tachycardia.  CORONARY RISK FACTORS:  diabetes mellitus, dyslipidemia, hypertension, male gender, and smoking/ tobacco exposure  Prior Cardiac Workup: EKG today 11/18/2023: sinus tachycardia with PACS, TWI in inferior leads. TTE 11/10/2023:  1. There is mid cavity acceleration of up to , unable to augment with Valsalva. Left ventricular ejection fraction, by estimation, is 65 to 70%. The left ventricle has normal function. The left ventricle has no regional wall motion abnormalities.  There is mild concentric left ventricular hypertrophy. Left ventricular diastolic parameters are consistent with Grade I diastolic dysfunction (impaired relaxation).   2. Right ventricular systolic function is normal. The right ventricular size is normal. Tricuspid regurgitation signal is inadequate for assessing PA pressure.   3. The mitral valve is normal in structure. Trivial mitral valve regurgitation.   4. The aortic valve has an indeterminant number of cusps. Aortic valve regurgitation is trivial.   5. The inferior vena cava is normal in size with greater than 50% respiratory variability, suggesting right atrial pressure of 3 mmHg.   LHC 05/10/2019: Mild irregularities in the coroanry arteries.  No significant disease  Diagnostic Procedure Recommendations  False positive stress test  Continue preventive medicine  From cardiac stand point the patient is clear to enter in the kidney  transplant list   Medical History[1]  Surgical History[2]       Family History[3]  Current Medications[4]  Allergies[5]   REVIEW OF SYSTEMS:  All other systems were reviewed and were  negative in detail except as noted in the HPI.  PHYSICAL EXAMINATION: Vitals:   11/18/23 0935  BP: 130/85  Pulse: (!) 128  SpO2:    Weights (last 90 days)     Date/Time Weight    11/18/23 0929 98.2 kg (216 lb 6.4 oz)       General:  Resting comfortably in NAD Skin:  Warm & dry Neuro:  Alert HEENT:  Sclera anicteric.   Resp:  Resp even and nonlabored.  Lungs sounds clear throughout CV:  S1S2 regular rate and rhythm, no murmur noted, No gallops or rubs Abd:  Soft, nontender, NABS Ext:  No cyanosis or edema MSK:  Moving all extremities.   Neck:  No thyromegaly.  No JVD.  no carotid bruit(s) Pulses:  2+ and symmetrical upper and lower extremities.   Heme: No signs of bleeding or excessive bruising.  Labs/Imaging: No results found for: CKTOTAL, CKMB, CKMBINDEX   .    Lab Results  Component Value Date   MG 1.5 (L) 06/30/2020   AST 16 05/30/2023   ALT 35 05/30/2023   BILITOT 0.5 05/30/2023   INR 1.3 05/26/2023   CHOL 156 01/29/2023   HDL 25 (L) 01/29/2023        [1] Past Medical History: Diagnosis Date  . Avascular bone necrosis    (CMD)   [2] Past Surgical History: Procedure Laterality Date  . HIP SURGERY  12/07/2022  [3] Family History Problem Relation Name Age of Onset  . Diabetes Father    [4] Current Outpatient Medications  Medication Sig Dispense Refill  . BD Nano 2nd Gen Pen Needle 32 gauge x 5/32 ndle USE ONE PEN NEEDLE DAILY 90 each 3  . blood-glucose sensor (FreeStyle Libre 3 Sensor) 1 each by miscellaneous route every 14 (fourteen) days. 6 each 1  . cinacalcet  (SENSIPAR ) 30 mg tablet Take 30 mg by mouth daily. Take with food or shortly afer a meal. Swallow tablet whole; do not break or divide.    . ergocalciferol (VITAMIN D2) 1,250 mcg (50,000 unit) capsule Take 50,000 Units by mouth once a week. Weekly x4 months then monthly thereafter    . icosapent  ethyL 1 gram cap Take 2 g by mouth in the morning and 2 g in the evening. Take with meals. 360 capsule 2  . insulin  lispro (HumaLOG KwikPen) 100 unit/mL KwikPen Give insulin  based on sliding scale three times daily either 15 minutes before or after meals. (125-150=2units  ,151-200=4units, 201-250=6Units, 251-300=8units, 301-350=10Units, 351-400=12units >500call office. 40 mL 0  . predniSONE  (DELTASONE ) 5 mg tablet Take 5 mg by mouth daily.    . traZODone  (DESYREL ) 50 mg tablet Take 50 mg by mouth at bedtime.    . Tresiba  FlexTouch U-100 100 unit/mL (3 mL) pen Inject 34 Units under the skin daily. 45 mL 1  . amLODIPine  (NORVASC ) 5 mg tablet Take 10 mg by mouth daily.    . carvediloL  (COREG ) 25 mg tablet Take 25 mg by mouth in the morning and 25 mg in the evening. Take with meals.    . chlorproMAZINE  (THORAZINE ) 10 mg tablet Take 1 tablet (10 mg total) by mouth 2 (two) times a day as needed (hiccups). (Patient not taking: Reported on 11/18/2023) 14 tablet 0  . cloNIDine  (CATAPRES ) 0.3 mg tablet Take 1 tablet by mouth 2 (two) times a day.    . fenofibrate nanocrystallized (TRICOR) 145 mg tablet Take 1 tablet (145 mg total) by mouth daily. 90 tablet 2  . insulin  syringe,safety needle (  BD SafetyGlide Insulin  Syringe) 0.5 mL 30 gauge x 5/16 syrg Use as directed 50 each 2  . mycophenolate  (CELLCEPT ) 500 mg tablet Take 1.5 tablets (750 mg total) by mouth 2 (two) times a day. 270 tablet 0  . tacrolimus  (PROGRAF ) 1 mg capsule Take 4 capsules (4 mg total) by mouth in the morning and 4 capsules (4 mg total) in the evening. (Patient not taking: Reported on 11/18/2023) 720 capsule 0   No current facility-administered medications for this visit.  [5] Allergies Allergen Reactions  . Adhesive Rash    Patient states tape pulls his skin off.

## 2023-11-23 ENCOUNTER — Encounter (HOSPITAL_COMMUNITY): Payer: Self-pay | Admitting: Emergency Medicine

## 2023-11-23 ENCOUNTER — Observation Stay (HOSPITAL_COMMUNITY)
Admission: EM | Admit: 2023-11-23 | Discharge: 2023-11-25 | Disposition: A | Discharge status: Home / Self Care | Attending: Internal Medicine | Admitting: Internal Medicine

## 2023-11-23 ENCOUNTER — Emergency Department (HOSPITAL_COMMUNITY)

## 2023-11-23 ENCOUNTER — Other Ambulatory Visit: Payer: Self-pay

## 2023-11-23 DIAGNOSIS — F411 Generalized anxiety disorder: Secondary | ICD-10-CM | POA: Insufficient documentation

## 2023-11-23 DIAGNOSIS — K219 Gastro-esophageal reflux disease without esophagitis: Secondary | ICD-10-CM | POA: Diagnosis not present

## 2023-11-23 DIAGNOSIS — Z992 Dependence on renal dialysis: Secondary | ICD-10-CM | POA: Diagnosis not present

## 2023-11-23 DIAGNOSIS — Z794 Long term (current) use of insulin: Secondary | ICD-10-CM

## 2023-11-23 DIAGNOSIS — Z87448 Personal history of other diseases of urinary system: Secondary | ICD-10-CM | POA: Insufficient documentation

## 2023-11-23 DIAGNOSIS — E785 Hyperlipidemia, unspecified: Secondary | ICD-10-CM | POA: Insufficient documentation

## 2023-11-23 DIAGNOSIS — I509 Heart failure, unspecified: Secondary | ICD-10-CM | POA: Diagnosis not present

## 2023-11-23 DIAGNOSIS — E7849 Other hyperlipidemia: Secondary | ICD-10-CM

## 2023-11-23 DIAGNOSIS — Z8679 Personal history of other diseases of the circulatory system: Secondary | ICD-10-CM

## 2023-11-23 DIAGNOSIS — I429 Cardiomyopathy, unspecified: Secondary | ICD-10-CM | POA: Insufficient documentation

## 2023-11-23 DIAGNOSIS — N186 End stage renal disease: Secondary | ICD-10-CM | POA: Insufficient documentation

## 2023-11-23 DIAGNOSIS — D62 Acute posthemorrhagic anemia: Secondary | ICD-10-CM | POA: Diagnosis present

## 2023-11-23 DIAGNOSIS — I132 Hypertensive heart and chronic kidney disease with heart failure and with stage 5 chronic kidney disease, or end stage renal disease: Secondary | ICD-10-CM | POA: Diagnosis not present

## 2023-11-23 DIAGNOSIS — Z940 Kidney transplant status: Secondary | ICD-10-CM | POA: Insufficient documentation

## 2023-11-23 DIAGNOSIS — I10 Essential (primary) hypertension: Secondary | ICD-10-CM

## 2023-11-23 DIAGNOSIS — T8611 Kidney transplant rejection: Secondary | ICD-10-CM

## 2023-11-23 DIAGNOSIS — Z8719 Personal history of other diseases of the digestive system: Secondary | ICD-10-CM

## 2023-11-23 DIAGNOSIS — D649 Anemia, unspecified: Principal | ICD-10-CM | POA: Insufficient documentation

## 2023-11-23 DIAGNOSIS — Z8711 Personal history of peptic ulcer disease: Secondary | ICD-10-CM | POA: Diagnosis not present

## 2023-11-23 DIAGNOSIS — R310 Gross hematuria: Secondary | ICD-10-CM

## 2023-11-23 DIAGNOSIS — Z87438 Personal history of other diseases of male genital organs: Secondary | ICD-10-CM

## 2023-11-23 DIAGNOSIS — R319 Hematuria, unspecified: Secondary | ICD-10-CM | POA: Insufficient documentation

## 2023-11-23 DIAGNOSIS — E1122 Type 2 diabetes mellitus with diabetic chronic kidney disease: Secondary | ICD-10-CM | POA: Insufficient documentation

## 2023-11-23 DIAGNOSIS — E119 Type 2 diabetes mellitus without complications: Secondary | ICD-10-CM

## 2023-11-23 LAB — COMPREHENSIVE METABOLIC PANEL WITH GFR
ALT: 57 U/L — ABNORMAL HIGH (ref 0–44)
AST: 44 U/L — ABNORMAL HIGH (ref 15–41)
Albumin: 1.7 g/dL — ABNORMAL LOW (ref 3.5–5.0)
Alkaline Phosphatase: 75 U/L (ref 38–126)
Anion gap: 17 — ABNORMAL HIGH (ref 5–15)
BUN: 54 mg/dL — ABNORMAL HIGH (ref 6–20)
CO2: 23 mmol/L (ref 22–32)
Calcium: 7.5 mg/dL — ABNORMAL LOW (ref 8.9–10.3)
Chloride: 91 mmol/L — ABNORMAL LOW (ref 98–111)
Creatinine, Ser: 11.99 mg/dL — ABNORMAL HIGH (ref 0.61–1.24)
GFR, Estimated: 5 mL/min — ABNORMAL LOW (ref >60)
Glucose, Bld: 273 mg/dL — ABNORMAL HIGH (ref 70–99)
Potassium: 4.2 mmol/L (ref 3.5–5.1)
Sodium: 131 mmol/L — ABNORMAL LOW (ref 135–145)
Total Bilirubin: 0.9 mg/dL (ref 0.0–1.2)
Total Protein: 6.7 g/dL (ref 6.5–8.1)

## 2023-11-23 LAB — CBC WITH DIFFERENTIAL/PLATELET
Abs Granulocyte: 5.2 K/uL (ref 1.5–6.5)
Abs Immature Granulocytes: 0.03 K/uL (ref 0.00–0.07)
Basophils Absolute: 0 K/uL (ref 0.0–0.1)
Basophils Relative: 0 %
Eosinophils Absolute: 0.1 K/uL (ref 0.0–0.5)
Eosinophils Relative: 2 %
HCT: 17.3 % — ABNORMAL LOW (ref 39.0–52.0)
Hemoglobin: 5.3 g/dL — CL (ref 13.0–17.0)
Immature Granulocytes: 1 %
Lymphocytes Relative: 12 %
Lymphs Abs: 0.8 K/uL (ref 0.7–4.0)
MCH: 26.8 pg (ref 26.0–34.0)
MCHC: 30.6 g/dL (ref 30.0–36.0)
MCV: 87.4 fL (ref 80.0–100.0)
Monocytes Absolute: 0.4 K/uL (ref 0.1–1.0)
Monocytes Relative: 7 %
Neutro Abs: 5.2 K/uL (ref 1.7–7.7)
Neutrophils Relative %: 78 %
Platelets: 343 K/uL (ref 150–400)
RBC: 1.98 MIL/uL — ABNORMAL LOW (ref 4.22–5.81)
RDW: 16.8 % — ABNORMAL HIGH (ref 11.5–15.5)
WBC: 6.6 K/uL (ref 4.0–10.5)
nRBC: 0 % (ref 0.0–0.2)

## 2023-11-23 LAB — URINALYSIS, ROUTINE W REFLEX MICROSCOPIC
RBC / HPF: >50 RBC/hpf (ref 0–5)
WBC, UA: >50 WBC/hpf (ref 0–5)

## 2023-11-23 LAB — PREPARE RBC (CROSSMATCH)

## 2023-11-23 MED ORDER — SODIUM CHLORIDE 0.9% IV SOLUTION: Freq: Once | INTRAVENOUS | Status: AC

## 2023-11-23 MED ORDER — TACROLIMUS 1 MG PO CAPS
3.0000 mg | ORAL_CAPSULE | Freq: Every day | ORAL | Status: DC
  Administered 2023-11-24 – 2023-11-25 (×2): 3 mg via ORAL
  Filled 2023-11-23 (×2): qty 3

## 2023-11-23 MED ORDER — SODIUM CHLORIDE 0.9 % IV SOLN: 250.0000 mL | INTRAVENOUS | Status: AC | PRN

## 2023-11-23 MED ORDER — ACETAMINOPHEN 325 MG PO TABS: 650.0000 mg | ORAL_TABLET | Freq: Four times a day (QID) | ORAL | Status: DC | PRN

## 2023-11-23 MED ORDER — PREDNISONE 10 MG PO TABS
5.0000 mg | ORAL_TABLET | Freq: Every day | ORAL | Status: DC
  Administered 2023-11-24 – 2023-11-25 (×2): 5 mg via ORAL
  Filled 2023-11-23 (×2): qty 1

## 2023-11-23 MED ORDER — ICOSAPENT ETHYL 1 G PO CAPS: 2.0000 g | ORAL_CAPSULE | Freq: Two times a day (BID) | ORAL | Status: DC

## 2023-11-23 MED ORDER — ONDANSETRON HCL 4 MG PO TABS: 4.0000 mg | ORAL_TABLET | Freq: Four times a day (QID) | ORAL | Status: DC | PRN

## 2023-11-23 MED ORDER — INSULIN ASPART 100 UNIT/ML IJ SOLN: 0.0000 [IU] | Freq: Every day | INTRAMUSCULAR | Status: DC

## 2023-11-23 MED ORDER — PANTOPRAZOLE SODIUM 40 MG PO TBEC
40.0000 mg | DELAYED_RELEASE_TABLET | Freq: Two times a day (BID) | ORAL | Status: DC
  Administered 2023-11-24 – 2023-11-25 (×3): 40 mg via ORAL
  Filled 2023-11-23 (×3): qty 1

## 2023-11-23 MED ORDER — SODIUM CHLORIDE 0.9% FLUSH
3.0000 mL | Freq: Two times a day (BID) | INTRAVENOUS | Status: DC
  Administered 2023-11-24 (×2): 3 mL via INTRAVENOUS

## 2023-11-23 MED ORDER — ACETAMINOPHEN 650 MG RE SUPP: 650.0000 mg | Freq: Four times a day (QID) | RECTAL | Status: DC | PRN

## 2023-11-23 MED ORDER — OLANZAPINE 5 MG PO TABS
5.0000 mg | ORAL_TABLET | Freq: Every day | ORAL | Status: DC
  Administered 2023-11-24 – 2023-11-25 (×2): 5 mg via ORAL
  Filled 2023-11-23 (×2): qty 1

## 2023-11-23 MED ORDER — SODIUM CHLORIDE 0.9% FLUSH: 3.0000 mL | INTRAVENOUS | Status: DC | PRN

## 2023-11-23 MED ORDER — TACROLIMUS 1 MG PO CAPS
2.0000 mg | ORAL_CAPSULE | Freq: Every day | ORAL | Status: DC
  Administered 2023-11-24 – 2023-11-25 (×2): 2 mg via ORAL
  Filled 2023-11-23 (×2): qty 2

## 2023-11-23 MED ORDER — INSULIN GLARGINE-YFGN 100 UNIT/ML ~~LOC~~ SOLN
30.0000 [IU] | Freq: Every day | SUBCUTANEOUS | Status: DC
  Administered 2023-11-24: 30 [IU] via SUBCUTANEOUS
  Filled 2023-11-23 (×3): qty 0.3

## 2023-11-23 MED ORDER — ONDANSETRON HCL 4 MG/2ML IJ SOLN: 4.0000 mg | Freq: Four times a day (QID) | INTRAMUSCULAR | Status: DC | PRN

## 2023-11-23 MED ORDER — DOCUSATE SODIUM 100 MG PO CAPS
100.0000 mg | ORAL_CAPSULE | Freq: Two times a day (BID) | ORAL | Status: DC
  Administered 2023-11-24 (×2): 100 mg via ORAL
  Filled 2023-11-23 (×3): qty 1

## 2023-11-23 MED ORDER — SODIUM CHLORIDE 0.9% FLUSH
3.0000 mL | Freq: Two times a day (BID) | INTRAVENOUS | Status: DC
  Administered 2023-11-24 – 2023-11-25 (×2): 3 mL via INTRAVENOUS

## 2023-11-23 MED ORDER — METOPROLOL SUCCINATE ER 25 MG PO TB24
25.0000 mg | ORAL_TABLET | Freq: Every day | ORAL | Status: DC
  Administered 2023-11-24 – 2023-11-25 (×2): 25 mg via ORAL
  Filled 2023-11-23 (×2): qty 1

## 2023-11-23 MED ORDER — INSULIN ASPART 100 UNIT/ML IJ SOLN
0.0000 [IU] | Freq: Three times a day (TID) | INTRAMUSCULAR | Status: DC
  Administered 2023-11-24: 1 [IU] via SUBCUTANEOUS
  Administered 2023-11-25: 2 [IU] via SUBCUTANEOUS

## 2023-11-23 NOTE — ED Notes (Signed)
Attempted x2 for PIV, unsuccessful.

## 2023-11-23 NOTE — ED Provider Triage Note (Signed)
 Emergency Medicine Provider Triage Evaluation Note  Barry Nichols , a 48 y.o. male  was evaluated in triage.  Pt complains of hbg 6.5.  Review of Systems  Positive: hematuria Negative: Black stool, blood in stool  Physical Exam  BP (!) 141/76 (BP Location: Left Leg)   Pulse (!) 109   Temp 98.4 F (36.9 C)   Resp 19   Ht 5' 10 (1.778 m)   Wt 100 kg   SpO2 100%   BMI 31.63 kg/m  Gen:   Awake, no distress   Resp:  Normal effort  MSK:   Moves extremities without difficulty  Other:    Medical Decision Making  Medically screening exam initiated at 6:23 PM.  Appropriate orders placed.  Barry Nichols was informed that the remainder of the evaluation will be completed by another provider, this initial triage assessment does not replace that evaluation, and the importance of remaining in the ED until their evaluation is complete.     Shermon Warren SAILOR, [place] 11/23/23 8176

## 2023-11-23 NOTE — ED Provider Notes (Signed)
 [name] Provider Note   CSN: [id number] Arrival date & time: 11/23/23  1806     Patient presents with: Hematuria and Abnormal Lab (HGB 6.2)   Barry Nichols is a 48 y.o. male.   Patient with history of CHF, ESRD on hemodialysis, hypertension presents today with complaints of hematuria, abnormal lab. Reports he had labs drawn yesterday and was called to come in and that his hemoglobin was 6.2.  Reports that he has not been making urine for the past several years until July 1 when he had a colonoscopy and had some of his medications changed and suddenly started voiding again. 10 days ago he reports he started having frank hematuria. Denies any abdominal pain, nausea, vomiting, or diarrhea. No dysuria. Patient reports he received a full dialysis session yesterday. He reports he has been feeling weak and fatigued for the past few days.  Denies fevers or chills.  Does report that he has anemic at baseline, has received blood transfusions previously. He is not anticoagulated.  The history is provided by the patient. No language interpreter was used.  Hematuria  Abnormal Lab      Prior to Admission medications   Medication Sig Start Date End Date Taking? Authorizing Provider  cinacalcet  (SENSIPAR ) 30 MG tablet Take 30 mg by mouth daily.    [provider]  icosapent  Ethyl (VASCEPA ) 1 g capsule Take 2 g by mouth 2 (two) times daily.    [provider]  insulin  degludec (TRESIBA ) 200 UNIT/ML FlexTouch Pen Inject 30 Units into the skin at bedtime. 09/23/21   [provider]  insulin  lispro (HUMALOG KWIKPEN) 200 UNIT/ML KwikPen Inject 25-40 Units into the skin 3 (three) times daily as needed (high blood sugar). 09/02/20   [provider]  metoprolol  succinate (TOPROL -XL) 25 MG 24 hr tablet Take 1 tablet (25 mg total) by mouth every evening. 11/11/23   Ghimire, Donalda HERO, MD  OLANZapine  (ZYPREXA ) 5 MG tablet Take  1 tablet (5 mg total) by mouth daily. 11/12/23   Ghimire, Donalda HERO, MD  pantoprazole  (PROTONIX ) 40 MG tablet Take 1 tablet (40 mg total) by mouth 2 (two) times daily before a meal. Take twice daily x 6 weeks, then switch to once daily. 11/11/23   Ghimire, Donalda HERO, MD  predniSONE  (DELTASONE ) 5 MG tablet Take 5 mg by mouth daily.    [provider]  sulfamethoxazole -trimethoprim  (BACTRIM ) 400-80 MG tablet Take 1 tablet by mouth every Monday, Wednesday, and Friday. Patient not taking: Reported on 11/10/2023    [provider]  tacrolimus  (PROGRAF ) 1 MG capsule Take 2-3 mg by mouth See admin instructions. Take 3 capsules (3mg ) in the morning and then take 2 capsules (2mg ) at bedtime. Patient not taking: Reported on 11/10/2023    [provider]  tadalafil (CIALIS) 20 MG tablet Take 20 mg by mouth daily as needed for erectile dysfunction.    [provider]  traZODone  (DESYREL ) 50 MG tablet Take 50 mg by mouth at bedtime.    [provider]  Vitamin D, Ergocalciferol, (DRISDOL) 1.25 MG (50000 UNIT) CAPS capsule Take 50,000 Units by mouth every 7 (seven) days. 03/02/23   [provider]    Allergies: Patient has no known allergies.    Review of Systems  Genitourinary:  Positive for hematuria.  All other systems reviewed and are negative.   Updated Vital Signs BP (!) 141/76 (BP Location: Left Leg)   Pulse (!) 109  Temp 98.4 F (36.9 C)   Resp 19   Ht 5' 10 (1.778 m)   Wt 100 kg   SpO2 100%   BMI 31.63 kg/m   Physical Exam Vitals and nursing note reviewed.  Constitutional:      General: He is not in acute distress.    Appearance: Normal appearance. He is normal weight. He is not ill-appearing, toxic-appearing or diaphoretic.  HENT:     Head: Normocephalic and atraumatic.  Cardiovascular:     Rate and Rhythm: Normal rate.  Pulmonary:     Effort: Pulmonary effort is normal. No respiratory distress.  Abdominal:     General:  Abdomen is flat.     Palpations: Abdomen is soft.     Tenderness: There is no abdominal tenderness.  Musculoskeletal:        General: Normal range of motion.     Cervical back: Normal range of motion.  Skin:    General: Skin is warm and dry.  Neurological:     General: No focal deficit present.     Mental Status: He is alert.  Psychiatric:        Mood and Affect: Mood normal.        Behavior: Behavior normal.     (all labs ordered are listed, but only abnormal results are displayed) Labs Reviewed  COMPREHENSIVE METABOLIC PANEL WITH GFR - Abnormal; Notable for the following components:      Result Value   Sodium 131 (*)    Chloride 91 (*)    Glucose, Bld 273 (*)    BUN 54 (*)    Creatinine, Ser 11.99 (*)    Calcium 7.5 (*)    Albumin 1.7 (*)    AST 44 (*)    ALT 57 (*)    GFR, Estimated 5 (*)    Anion gap 17 (*)    All other components within normal limits  CBC WITH DIFFERENTIAL/PLATELET - Abnormal; Notable for the following components:   RBC 1.98 (*)    Hemoglobin 5.3 (*)    HCT 17.3 (*)    RDW 16.8 (*)    All other components within normal limits  URINALYSIS, ROUTINE W REFLEX MICROSCOPIC - Abnormal; Notable for the following components:   Color, Urine RED (*)    APPearance TURBID (*)    Glucose, UA   (*)    Value: TEST NOT REPORTED DUE TO COLOR INTERFERENCE OF URINE PIGMENT   Hgb urine dipstick   (*)    Value: TEST NOT REPORTED DUE TO COLOR INTERFERENCE OF URINE PIGMENT   Bilirubin Urine   (*)    Value: TEST NOT REPORTED DUE TO COLOR INTERFERENCE OF URINE PIGMENT   Ketones, ur   (*)    Value: TEST NOT REPORTED DUE TO COLOR INTERFERENCE OF URINE PIGMENT   Protein, ur   (*)    Value: TEST NOT REPORTED DUE TO COLOR INTERFERENCE OF URINE PIGMENT   Nitrite   (*)    Value: TEST NOT REPORTED DUE TO COLOR INTERFERENCE OF URINE PIGMENT   Leukocytes,Ua   (*)    Value: TEST NOT REPORTED DUE TO COLOR INTERFERENCE OF URINE PIGMENT   Bacteria, UA RARE (*)    All other  components within normal limits  URINE CULTURE  TYPE AND SCREEN  PREPARE RBC (CROSSMATCH)  PREPARE RBC (CROSSMATCH)    EKG: None  Radiology: CT Renal Stone Study Result Date: 11/23/2023 CLINICAL DATA:  Abdominal/flank pain.  Concern for kidney stone. EXAM: CT  ABDOMEN AND PELVIS WITHOUT CONTRAST TECHNIQUE: Multidetector CT imaging of the abdomen and pelvis was performed following the standard protocol without IV contrast. RADIATION DOSE REDUCTION: This exam was performed according to the departmental dose-optimization program which includes automated exposure control, adjustment of the mA and/or kV according to patient size and/or use of iterative reconstruction technique. COMPARISON:  CT abdomen pelvis dated 11/04/2023. FINDINGS: Evaluation of this exam is limited in the absence of intravenous contrast. Lower chest: The visualized lung bases are clear. No intra-abdominal free air or free fluid. Hepatobiliary: The liver is unremarkable. No biliary dilatation. The gallbladder is unremarkable Pancreas: Unremarkable. No pancreatic ductal dilatation or surrounding inflammatory changes. Spleen: Normal in size without focal abnormality. Adrenals/Urinary Tract: The adrenal glands unremarkable. Atrophic native kidneys. Several small renal cysts noted. There is no hydronephrosis on either side. 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 The urinary bladder is collapsed. Stomach/Bowel: There is mild sigmoid diverticulosis. There is no bowel obstruction or active inflammation. The appendix is normal. Vascular/Lymphatic: Mild aortoiliac atherosclerotic disease. The IVC is unremarkable. No portal venous gas. There is no adenopathy. Reproductive: The prostate and seminal vesicles are grossly unremarkable. No pelvic mass. 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 No acute osseous  pathology. IMPRESSION: 1. No acute intra-abdominal or pelvic pathology. 2. 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 3. Mild sigmoid diverticulosis. No bowel obstruction. Normal appendix. 4.  Aortic Atherosclerosis (ICD10-I70.0). Electronically Signed   By: Vanetta Chou M.D.   On: 11/23/2023 21:33     .Critical Care  Performed by: Nora Lauraine LABOR, PA-C Authorized by: Atul Delucia A, PA-C   Critical care provider statement:    Critical care time (minutes):  35   Critical care was necessary to treat or prevent imminent or life-threatening deterioration of the following conditions: symptomatic anemia requiring RBC transfusion.   Critical care was time spent personally by me on the following activities:  Development of treatment plan with patient or surrogate, discussions with primary provider, evaluation of patient's response to treatment, examination of patient, obtaining history from patient or surrogate, ordering and review of laboratory studies, ordering and review of radiographic studies, pulse oximetry, re-evaluation of patient's condition and review of old charts   Care discussed with: admitting provider      Medications Ordered in the ED - No data to display                                  Medical Decision Making Amount and/or Complexity of Data Reviewed Labs: ordered. Radiology: ordered.  Risk Prescription drug management. Decision regarding hospitalization.   This patient is a 48 y.o. male who presents to the ED for concern of hematuria, abnormal lab, this involves an extensive number of treatment options, and is a complaint that carries with it a high risk of complications and morbidity. The emergent differential diagnosis prior to evaluation includes, but is not limited to,  Cystitis, urinary calculi, renal cell carcinoma, bladder cancer, glomerulonephritis, polycystic kidneys, anticoagulant usage, interstitial nephritis, BPH or  prostate cancer  This is not an exhaustive differential.   Past Medical History / Co-morbidities / Social History:  has a past medical history of Anemia, Arthritis, CHF (congestive heart failure) (HCC), CKD (chronic kidney disease), stage IV (HCC), Diabetes mellitus without complication (HCC), Hallux limitus,  History of gout (~ 2013/2014), Hypertension, Hypertensive CKD (chronic kidney disease), Metatarsal deformity, Migraine, Posterior equinus, acquired, and Pre-diabetes.  Additional history: Chart reviewed. Pertinent results include: patient is anemic at baseline, has required transfusion previously, last time was during admission on 7/23. Has had melanotic stools previously, EGD at that time showed no active bleeding  Physical Exam: Physical exam performed. The pertinent findings include: no acute physical exam abnormalities, abdomen soft and nontender  Lab Tests: I ordered, and personally interpreted labs.  The pertinent results include:  no leukocytosis, hgb 5.3 (down from 6.2 yesterday), Na 131, chloride 91, glucose 273, kidney function at baseline. UA with frank hematuria   Imaging Studies: I ordered imaging studies including CT renal. I independently visualized and interpreted imaging which showed   1. No acute intra-abdominal or pelvic pathology. 2. 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 3. Mild sigmoid diverticulosis. No bowel obstruction. Normal appendix. 4.  Aortic Atherosclerosis  I agree with the radiologist interpretation.   Medications: I ordered medication including RBC transfusion  for symptomatic anemia. Reevaluation of the patient after these medicines is pending at admission.   Disposition: After consideration of the diagnostic results and the patients response to treatment, I feel that patient will require admission for symptomatic anemia given patients hemoglobin has dropped from 6.2 to 5.3 since yesterday and he is  symptomatic.  Discussed plan with patient is understanding and in agreement with this.  Discussed patient with hospitalist Dr. Sundil who accepts patient for admission  I discussed this case with my attending physician Dr. Ruthe who cosigned this note including patient's presenting symptoms, physical exam, and planned diagnostics and interventions. Attending physician stated agreement with plan or made changes to plan which were implemented.    Final diagnoses:  Symptomatic anemia  Gross hematuria    ED Discharge Orders     None          Nora Lauraine DELENA DEVONNA 11/23/23 2209    Ruthe Cornet, DO 11/23/23 2239

## 2023-11-23 NOTE — H&P (Signed)
 History and Physical    Barry Nichols FMW:[id number] DOB: 10/13/1975 DOA: 11/23/2023  PCP: [name], PA-C   Patient coming from: Home   Chief Complaint:  Chief Complaint  Patient presents with   Hematuria   Abnormal Lab    HGB 6.2   ED TRIAGE note:  Pt arrives via POV. Pt states he had a full session of dialysis yesterday. The dialysis center called him today and informed him that his hgb was 6.2. Pt reports associated weakness, fatigue, and sob.  Pt also reports he has noticed blood in his urine over the past 10 days. Pt is AxOx4.     HPI:  Barry Nichols is a 48 y.o. 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  Patient had full dialysis session today and at dialysis unit lab drawn which showed low hemoglobin 6.2.  Also reporting noticing some blood in the urine. Patient denies any hematemesis/melena/black tarry stool.  Patient also does not have any UTI symptoms.  Patient denies any flank pain.  Per chart review patient was recently admitted and discharged on 7/4.  Patient was admitted for GI bleed found to have low hemoglobin 6.5.  Underwent EGD which showed esophageal ulcer.  Patient required blood transfusion and treated with PPI.  Most recent colonoscopy7/1 at [facility] in cecum/sigmoid colon -repeat colonoscopy x 3 years.   Patient reported then since he had coloscopy 1 month  ago started producing some urine again however for last 10 days noticing some hematuria.  Patient denies any UTI symptoms.  At presentation to ED patient is hemodynamically stable. CBC showing  normal WBC count, low hemoglobin 5.3 (baseline hemoglobin around 7.6-9.3), no hematocrit and normal platelet count. CMP showing low sodium 131, evidence of ESRD, elevated blood glucose 273, chronically elevated AST/ALT and low albumin 1.7.  UA red and turbid appearance. In the ED type and screen has been obtained. Also 1 unit of blood transfusion has been ordered.  Requested ED physician change blood transfusion to 2 units.  CT renal stone study no acute intraabdominal or pelvic abnormality.Similar appearance of right lower quadrant renal transplant with findings of rejection. . Mild sigmoid diverticulosis. No bowel obstruction. Normal appendix.  Aortic atherosclerosis.  Hospitalist has been consulted for further evaluation hematuria and symptomatic anemia.   Significant labs in the ED: Lab Orders         Urine Culture         Comprehensive metabolic panel         CBC with Differential         Urinalysis, Routine w reflex microscopic -Urine, Clean Catch         Hemoglobin and hematocrit, blood         Comprehensive metabolic panel         CBC       Review of Systems:  Review of Systems  Constitutional:  Positive for malaise/fatigue. Negative for chills, fever and weight loss.  Respiratory:  Negative for cough, sputum production and shortness of breath.   Cardiovascular:  Negative for chest pain, palpitations, orthopnea and leg swelling.  Gastrointestinal:  Negative for abdominal pain, heartburn, nausea and vomiting.  Genitourinary:  Positive for hematuria. Negative for  dysuria, flank pain, frequency and urgency.  Musculoskeletal:  Negative for joint pain and myalgias.  Neurological:  Negative for dizziness and headaches.  Psychiatric/Behavioral:  The patient is not nervous/anxious.     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, Bilateral   Migraine    when I was young (08/25/2016   Posterior equinus, acquired    Bilateral   Pre-diabetes     Past Surgical History:  Procedure Laterality Date   AV FISTULA PLACEMENT Left 10/15/2016   Procedure: ARTERIOVENOUS (AV) FISTULA CREATION-LEFT ARM;  Surgeon: Laurence Redell CROME, MD;  Location: [facility] OR;  Service: Vascular;  Laterality: Left;   DECOMPRESSION HIP-CORE Left 12/07/2022   Procedure: LEFT HIP CORE DECOMPRESSION WITH ILIAC CREST BONE MARROW ASPIRATE;  Surgeon: Genelle Standing, MD;  Location: [facility] OR;  Service: Orthopedics;  Laterality: Left;   ESOPHAGOGASTRODUODENOSCOPY N/A 11/11/2023   Procedure: EGD (ESOPHAGOGASTRODUODENOSCOPY);  Surgeon: Nandigam, Kavitha V, MD;  Location: [facility] ENDOSCOPY;  Service: Gastroenterology;  Laterality: N/A;   INSERTION OF DIALYSIS CATHETER Right 10/15/2016   Procedure: INSERTION OF DIALYSIS CATHETER;  Surgeon: Laurence Redell CROME, MD;  Location: [facility] OR;  Service: Vascular;  Laterality: Right;   KIDNEY TRANSPLANT  06/15/2020   [facility] TOOTH EXTRACTION       reports that he has never smoked. He has never used smokeless tobacco. He reports that he does not drink alcohol and does not use drugs.  No Known Allergies  Family History  Problem Relation Age of Onset   Hypertension Father        Also maternal grandmother   Diabetes Father        also maternal grandmother   CVA Father    Cancer Maternal Grandfather        lung   Heart attack Maternal Grandfather     Prior to Admission medications   Medication Sig Start Date End Date Taking? Authorizing Provider  cinacalcet  (SENSIPAR ) 30 MG tablet Take 30 mg by mouth daily.    [provider]  icosapent  Ethyl (VASCEPA ) 1 g capsule Take 2 g by  mouth 2 (two) times daily.    [provider]  insulin  degludec (TRESIBA ) 200 UNIT/ML FlexTouch Pen Inject 30 Units into the skin at bedtime. 09/23/21   [provider]  insulin  lispro (HUMALOG KWIKPEN) 200 UNIT/ML KwikPen Inject 25-40 Units into the skin 3 (three) times daily as needed (high blood sugar). 09/02/20   [provider]  metoprolol  succinate (TOPROL -XL) 25 MG 24 hr tablet Take 1 tablet (25 mg total) by mouth every evening. 11/11/23   Ghimire, Donalda HERO, MD  OLANZapine  (ZYPREXA ) 5 MG tablet Take 1 tablet (5 mg total) by mouth daily. 11/12/23   Ghimire, Donalda HERO, MD  pantoprazole  (PROTONIX ) 40 MG tablet Take 1 tablet (40 mg total) by mouth 2 (two) times daily before a meal. Take twice daily x 6 weeks, then switch to once daily. 11/11/23   Ghimire, Donalda HERO, MD  predniSONE  (DELTASONE ) 5 MG  tablet Take 5 mg by mouth daily.    [provider]  sulfamethoxazole -trimethoprim  (BACTRIM ) 400-80 MG tablet Take 1 tablet by mouth every Monday, Wednesday, and Friday. Patient not taking: Reported on 11/10/2023    [provider]  tacrolimus  (PROGRAF ) 1 MG capsule Take 2-3 mg by mouth See admin instructions. Take 3 capsules (3mg ) in the morning and then take 2 capsules (2mg ) at bedtime. Patient not taking: Reported on 11/10/2023    [provider]  tadalafil (CIALIS) 20 MG tablet Take 20 mg by mouth daily as needed for erectile dysfunction.    [provider]  traZODone  (DESYREL ) 50 MG tablet Take 50 mg by mouth at bedtime.    [provider]  Vitamin D, Ergocalciferol, (DRISDOL) 1.25 MG (50000 UNIT) CAPS capsule Take 50,000 Units by mouth every 7 (seven) days. 03/02/23   [provider]     Physical Exam: Vitals:   11/23/23 1818 11/23/23 1821  BP: (!) 141/76   Pulse: (!) 109   Resp: 19   Temp: 98.4 F (36.9 C)   SpO2: 100%   Weight:  100 kg  Height:  5' 10 (1.778 m)    Physical Exam Vitals and nursing note  reviewed.  Constitutional:      General: He is not in acute distress.    Appearance: He is ill-appearing.  HENT:     Mouth/Throat:     Mouth: Mucous membranes are moist.  Eyes:     Pupils: Pupils are equal, round, and reactive to light.  Cardiovascular:     Rate and Rhythm: Regular rhythm. Tachycardia present.     Pulses: Normal pulses.     Heart sounds: Normal heart sounds.  Pulmonary:     Effort: Pulmonary effort is normal.     Breath sounds: Normal breath sounds.  Abdominal:     Palpations: Abdomen is soft.  Musculoskeletal:     Cervical back: Neck supple.     Right lower leg: No edema.     Left lower leg: No edema.  Skin:    General: Skin is warm.     Capillary Refill: Capillary refill takes less than 2 seconds.  Neurological:     Mental Status: He is alert and oriented to person, place, and time.  Psychiatric:        Mood and Affect: Mood normal.        Thought Content: Thought content normal.      Labs on Admission: I have personally reviewed following labs and imaging studies  CBC: Recent Labs  Lab 11/23/23 2046  WBC 6.6  NEUTROABS 5.2  HGB 5.3*  HCT 17.3*  MCV 87.4  PLT 343   Basic Metabolic Panel: Recent Labs  Lab 11/23/23 2046  NA 131*  K 4.2  CL 91*  CO2 23  GLUCOSE 273*  BUN 54*  CREATININE 11.99*  CALCIUM 7.5*   GFR: Estimated Creatinine Clearance: 9 mL/min (A) (by C-G formula based on SCr of 11.99 mg/dL (H)). Liver Function Tests: Recent Labs  Lab 11/23/23 2046  AST 44*  ALT 57*  ALKPHOS 75  BILITOT 0.9  PROT 6.7  ALBUMIN 1.7*   No results for input(s): LIPASE, AMYLASE in the last 168 hours. No results for input(s): AMMONIA in the last 168 hours. Coagulation Profile: No results for input(s): INR, PROTIME in the last 168 hours. Cardiac Enzymes: No results for input(s): CKTOTAL, CKMB, CKMBINDEX, TROPONINI, TROPONINIHS in the last 168 hours. BNP (last 3 results) No results for input(s):  BNP in the last  8760 hours. HbA1C: No results for input(s): HGBA1C in the last 72 hours. CBG: No results for input(s): GLUCAP in the last 168 hours. Lipid Profile: No results for input(s): CHOL, HDL, LDLCALC, TRIG, CHOLHDL, LDLDIRECT in the last 72 hours. Thyroid Function Tests: No results for input(s): TSH, T4TOTAL, FREET4, T3FREE, THYROIDAB in the last 72 hours. Anemia Panel: No results for input(s): VITAMINB12, FOLATE, FERRITIN, TIBC, IRON, RETICCTPCT in the last 72 hours. Urine analysis:    Component Value Date/Time   COLORURINE RED (A) 11/23/2023 1949   APPEARANCEUR TURBID (A) 11/23/2023 1949   LABSPEC  11/23/2023 1949    TEST NOT REPORTED DUE TO COLOR INTERFERENCE OF URINE PIGMENT   PHURINE  11/23/2023 1949    TEST NOT REPORTED DUE TO COLOR INTERFERENCE OF URINE PIGMENT   GLUCOSEU (A) 11/23/2023 1949    TEST NOT REPORTED DUE TO COLOR INTERFERENCE OF URINE PIGMENT   HGBUR (A) 11/23/2023 1949    TEST NOT REPORTED DUE TO COLOR INTERFERENCE OF URINE PIGMENT   BILIRUBINUR (A) 11/23/2023 1949    TEST NOT REPORTED DUE TO COLOR INTERFERENCE OF URINE PIGMENT   BILIRUBINUR neg 09/24/2012 1059   KETONESUR (A) 11/23/2023 1949    TEST NOT REPORTED DUE TO COLOR INTERFERENCE OF URINE PIGMENT   PROTEINUR (A) 11/23/2023 1949    TEST NOT REPORTED DUE TO COLOR INTERFERENCE OF URINE PIGMENT   UROBILINOGEN 0.2 09/24/2012 1059   UROBILINOGEN 0.2 09/11/2011 0843   NITRITE (A) 11/23/2023 1949    TEST NOT REPORTED DUE TO COLOR INTERFERENCE OF URINE PIGMENT   LEUKOCYTESUR (A) 11/23/2023 1949    TEST NOT REPORTED DUE TO COLOR INTERFERENCE OF URINE PIGMENT    Radiological Exams on Admission: I have personally reviewed images CT Renal Stone Study Result Date: 11/23/2023 CLINICAL DATA:  Abdominal/flank pain.  Concern for kidney stone. EXAM: CT ABDOMEN AND PELVIS WITHOUT CONTRAST TECHNIQUE: Multidetector CT imaging of the abdomen and pelvis was performed following the standard  protocol without IV contrast. RADIATION DOSE REDUCTION: This exam was performed according to the departmental dose-optimization program which includes automated exposure control, adjustment of the mA and/or kV according to patient size and/or use of iterative reconstruction technique. COMPARISON:  CT abdomen pelvis dated 11/04/2023. FINDINGS: Evaluation of this exam is limited in the absence of intravenous contrast. Lower chest: The visualized lung bases are clear. No intra-abdominal free air or free fluid. Hepatobiliary: The liver is unremarkable. No biliary dilatation. The gallbladder is unremarkable Pancreas: Unremarkable. No pancreatic ductal dilatation or surrounding inflammatory changes. Spleen: Normal in size without focal abnormality. Adrenals/Urinary Tract: The adrenal glands unremarkable. Atrophic native kidneys. Several small renal cysts noted. There is no hydronephrosis on either side. 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 The urinary bladder is collapsed. Stomach/Bowel: There is mild sigmoid diverticulosis. There is no bowel obstruction or active inflammation. The appendix is normal. Vascular/Lymphatic: Mild aortoiliac atherosclerotic disease. The IVC is unremarkable. No portal venous gas. There is no adenopathy. Reproductive: The prostate and seminal vesicles are grossly unremarkable. No pelvic mass. 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 No acute osseous pathology. IMPRESSION: 1. No acute intra-abdominal or pelvic pathology. 2. 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 3. Mild sigmoid diverticulosis. No bowel obstruction. Normal appendix. 4.  Aortic Atherosclerosis (ICD10-I70.0). Electronically Signed   By: Vanetta Chou M.D.   On:  11/23/2023 21:33     EKG: My personal interpretation of EKG shows:      Assessment/Plan: Principal Problem:   Acute on chronic anemia Active Problems:   ESRD on dialysis [facility])   Essential hypertension   Hyperlipidemia   Hematuria   H/O sinus tachycardia   GERD (gastroesophageal reflux disease)   Cardiomyopathy (HCC)   Insulin  dependent type 2 diabetes mellitus (HCC)   History of renal transplant rejection   History of esophageal ulcer   Acute blood loss anemia   History of BPH   GAD (generalized anxiety disorder)    Assessment and Plan: Acute on chronic anemia Acute on chronic anemia secondary to hematuria History of chronic anemia secondary to ESRD Recent history of GI bleed 7/24 required EGD History of esophageal ulcer -Presented emergency department with complaining of hematuria for 10 days, also noticed generalized fatigue and dizziness.  Patient had completed full dialysis session today and received a call from dialysis unit stating that labs showed low hemoglobin 6.2 and patient was referred to emergency department.  At presentation to ED patient is hemodynamically stable.  CBC showing low hemoglobin 5.3.   UA showing turbid appearance secondary to gross hematuria.  Patient denies any UTI symptoms. - Patient reported making some urine for last 1 month. - Per chart review patient was admitted and discharged 7/24 due to GI bleed/black tarry stool in the setting of esophageal ulcer.  7/24 EGD showed esophageal ulcer and had colonoscopy in July 1 which showed rectal and sigmoid polyp. -Patient denies any hematemesis, hemoptysis melena. -CT renal stone study no acute intra-abdominal or pelvic abnormality.  Similar appearance right lower quadrant renal transplant with known finding of rejection. Patient is stating that he has been taking prednisone  however due to cost effectiveness not taking Prograf  almost last 1 month except he was taking it while he was in the hospital 7/24.   -It is possible that patient is experiencing hematuria secondary to  continuous rejection of renal transplant. Unclear exact etiology of of hematuria at this time.  Consulted urology Dr. Jesusa requesting to evaluate in the daytime.  -Transfusing 2 units of blood.  Continue monitor H&H and transfuse as needed goal to keep hemoglobin above 8. -Continue cardiac monitoring.  History of BPH - Presently patient is not on Flomax. CT scan today showed no evidence of prostatomegaly.   ESRD on TTS schedule -ESRD on dialysis TTS schedule.  Patient had full dialysis session today.  Please consult nephrology daytime.  History of renal transplant rejection Patient stating that he takes prednisone  however unable to afford Prograf  as it cost him $1200 in the month.  He was only getting it while in the hospital 7/24.  It is possible that patient is experiencing hematuria secondary to continued rejection of renal transplant. -Patient denies any flank pain, fever, chill, and UTI symptoms. - Continue prednisone . - Resuming Prograf  3 mg in the morning and 2 mg in the night. - Consulted transitional care team for medication assistance-Prograf     Essential hypertension History of cardiomyopathy History of chronic sinus tachycardia Holding Toprol -XL for tonight given patient is having active hematuria and low hemoglobin.   -Continue Toprol -XL 25 mg from tomorrow.  History of GERD -Continue Protonix  40 mg twice daily.  Insulin -dependent DM type II -Continue long-acting insulin  30 units at bedtime and sliding scale with mealtime coverage  Hyperlipidemia - Continue Vascepa    Generalized anxiety disorder - Continue Zyprexa   DVT prophylaxis:  SCDs.  Need to avoid pharmacological DVT prophylaxis  in the setting of Code Status:  Full Code Diet: Carb modified - renal diet Family Communication:   Family was present at bedside, at the time of interview. Opportunity was given to ask question and all questions were answered satisfactorily.  Disposition Plan: Continue to  monitor H&H transfuse as needed. Consults: Urology Admission status:   Inpatient, Telemetry bed  Severity of Illness: The appropriate patient status for this patient is INPATIENT. Inpatient status is judged to be reasonable and necessary in order to provide the required intensity of service to ensure the patient's safety. The patient's presenting symptoms, physical exam findings, and initial radiographic and laboratory data in the context of their chronic comorbidities is felt to place them at high risk for further clinical deterioration. Furthermore, it is not anticipated that the patient will be medically stable for discharge from the hospital within 2 midnights of admission.   * I certify that at the point of admission it is my clinical judgment that the patient will require inpatient hospital care spanning beyond 2 midnights from the point of admission due to high intensity of service, high risk for further deterioration and high frequency of surveillance required.DEWAINE    Audley Hinojos, MD [facility]  How to contact the [facility] Attending or Consulting provider 7A - 7P or covering provider during after hours 7P -7A, for this patient.  Check the care team in [facility] and look for a) attending/consulting [facility] provider listed and b) the [facility] team listed Log into [email or web address] and use [place]'s universal password to access. If you do not have the password, please contact the hospital operator. Locate the [facility] provider you are looking for under [facility] and page to a number that you can be directly reached. If you still have difficulty reaching the provider, please page the [facility] (Director on Call) for the Hospitalists listed on amion for assistance.  11/23/2023, 10:37 PM

## 2023-11-23 NOTE — ED Notes (Signed)
 Patient reports not making urine for years until recently. Patient states he had a colonoscopy July 1st and since then he started voiding again however it has had blood in it. States some of his medications have been changed since then. Patient has appt with PCP tomorrow at 2:45pm. Reports feeling weak and fatigued over the past 3 days.

## 2023-11-23 NOTE — ED Triage Notes (Signed)
 Pt arrives via POV. Pt states he had a full session of dialysis yesterday. The dialysis center called him today and informed him that his hgb was 6.2. Pt reports associated weakness, fatigue, and sob.  Pt also reports he has noticed blood in his urine over the past 10 days. Pt is AxOx4.

## 2023-11-23 NOTE — ED Notes (Signed)
 Patient transported to CT

## 2023-11-24 DIAGNOSIS — D649 Anemia, unspecified: Secondary | ICD-10-CM | POA: Diagnosis not present

## 2023-11-24 LAB — COMPREHENSIVE METABOLIC PANEL WITH GFR
ALT: 48 U/L — ABNORMAL HIGH (ref 0–44)
AST: 33 U/L (ref 15–41)
Albumin: 1.6 g/dL — ABNORMAL LOW (ref 3.5–5.0)
Alkaline Phosphatase: 74 U/L (ref 38–126)
Anion gap: 16 — ABNORMAL HIGH (ref 5–15)
BUN: 58 mg/dL — ABNORMAL HIGH (ref 6–20)
CO2: 26 mmol/L (ref 22–32)
Calcium: 8.3 mg/dL — ABNORMAL LOW (ref 8.9–10.3)
Chloride: 95 mmol/L — ABNORMAL LOW (ref 98–111)
Creatinine, Ser: 12.93 mg/dL — ABNORMAL HIGH (ref 0.61–1.24)
GFR, Estimated: 4 mL/min — ABNORMAL LOW (ref >60)
Glucose, Bld: 90 mg/dL (ref 70–99)
Potassium: 4.1 mmol/L (ref 3.5–5.1)
Sodium: 137 mmol/L (ref 135–145)
Total Bilirubin: 1.2 mg/dL (ref 0.0–1.2)
Total Protein: 6.6 g/dL (ref 6.5–8.1)

## 2023-11-24 LAB — CBC
HCT: 22.6 % — ABNORMAL LOW (ref 39.0–52.0)
Hemoglobin: 7.3 g/dL — ABNORMAL LOW (ref 13.0–17.0)
MCH: 28 pg (ref 26.0–34.0)
MCHC: 32.3 g/dL (ref 30.0–36.0)
MCV: 86.6 fL (ref 80.0–100.0)
Platelets: 374 K/uL (ref 150–400)
RBC: 2.61 MIL/uL — ABNORMAL LOW (ref 4.22–5.81)
RDW: 15.8 % — ABNORMAL HIGH (ref 11.5–15.5)
WBC: 7.5 K/uL (ref 4.0–10.5)
nRBC: 0 % (ref 0.0–0.2)

## 2023-11-24 LAB — CBG MONITORING, ED
Glucose-Capillary: 157 mg/dL — ABNORMAL HIGH (ref 70–99)
Glucose-Capillary: 90 mg/dL (ref 70–99)
Glucose-Capillary: 106 mg/dL — ABNORMAL HIGH (ref 70–99)

## 2023-11-24 LAB — GLUCOSE, CAPILLARY: Glucose-Capillary: 134 mg/dL — ABNORMAL HIGH (ref 70–99)

## 2023-11-24 MED ORDER — DARBEPOETIN ALFA 200 MCG/0.4ML IJ SOSY: 200.0000 ug | PREFILLED_SYRINGE | INTRAMUSCULAR | Status: DC

## 2023-11-24 MED ORDER — CHLORHEXIDINE GLUCONATE CLOTH 2 % EX PADS: 6.0000 | MEDICATED_PAD | Freq: Every day | CUTANEOUS | Status: DC

## 2023-11-24 MED ORDER — METOPROLOL TARTRATE 5 MG/5ML IV SOLN: 5.0000 mg | INTRAVENOUS | Status: DC | PRN

## 2023-11-24 MED ORDER — GUAIFENESIN 100 MG/5ML PO LIQD: 5.0000 mL | ORAL | Status: DC | PRN

## 2023-11-24 MED ORDER — HYDRALAZINE HCL 20 MG/ML IJ SOLN: 10.0000 mg | INTRAMUSCULAR | Status: DC | PRN

## 2023-11-24 MED ORDER — IPRATROPIUM-ALBUTEROL 0.5-2.5 (3) MG/3ML IN SOLN: 3.0000 mL | RESPIRATORY_TRACT | Status: DC | PRN

## 2023-11-24 MED ORDER — SENNOSIDES-DOCUSATE SODIUM 8.6-50 MG PO TABS: 1.0000 | ORAL_TABLET | Freq: Every evening | ORAL | Status: DC | PRN

## 2023-11-24 MED ORDER — GLUCAGON HCL RDNA (DIAGNOSTIC) 1 MG IJ SOLR: 1.0000 mg | INTRAMUSCULAR | Status: DC | PRN

## 2023-11-24 NOTE — Progress Notes (Signed)
 Received patient in bed to unit.  Alert and oriented.  Informed consent signed and in chart. TX duration: 3.5 hrs   Patient tolerated well.  Transported back to the room Alert, without acute distress.  Hand-off given to patient's nurse.   Access used: Left AVF  Access issues: none  Total UF removed:  Medication(s) given: none  Post HD VS: see table  Post HD weight: uta       11/24/23 2334  Vitals  Temp 98.7 F (37.1 C)  Temp Source Oral  BP 128/71  MAP (mmHg) 87  BP Location Right Arm  BP Method Automatic  Patient Position (if appropriate) Lying  Pulse Rate (!) 101  Pulse Rate Source Monitor  ECG Heart Rate 100  Resp (!) 25  Oxygen Therapy  SpO2 100 %  O2 Device Room Air  Patient Activity (if Appropriate) In bed  Pulse Oximetry Type Continuous  During Treatment Monitoring  Blood Flow Rate (mL/min) 399 mL/min  Arterial Pressure (mmHg) -190.5 mmHg  Venous Pressure (mmHg) 193.12 mmHg  TMP (mmHg) 3.63 mmHg  Ultrafiltration Rate (mL/min) 1027 mL/min  Dialysate Flow Rate (mL/min) 300 ml/min  Dialysate Potassium Concentration 3  Dialysate Calcium Concentration 2.5  Duration of HD Treatment -hour(s) 3.5 hour(s)  Cumulative Fluid Removed (mL) per Treatment  3000  HD Safety Checks Performed Yes  Intra-Hemodialysis Comments Tx completed;Tolerated well  Post Treatment  Dialyzer Clearance Lightly streaked  Hemodialysis Intake (mL) 0 mL  Liters Processed 84  Fluid Removed (mL) 3000 mL  Tolerated HD Treatment Yes  Post-Hemodialysis Comments Treatment completed, pt axo x4  AVG/AVF Arterial Site Held (minutes) 5 minutes  AVG/AVF Venous Site Held (minutes) 5 minutes  Fistula / Graft Left Forearm Arteriovenous fistula  No placement date or time found.   Placed prior to admission: Yes  Orientation: Left  Access Location: Forearm  Access Type: Arteriovenous fistula  Site Condition No complications  Fistula / Graft Assessment Present;Thrill;Bruit  Status Deaccessed   Drainage Description None     Mercer CHARLENA Montgomery, BSN, RN Kidney Dialysis Unit

## 2023-11-24 NOTE — Progress Notes (Addendum)
 PROGRESS NOTE    Barry Nichols  FMW:[id number] DOB: 1975/12/20 DOA: 11/23/2023 PCP: [name], PA-C    Brief Narrative:   48 y.o. 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  Recent admission for GI bleed, EGD showing esophageal ulcer and colonoscopy showing multiple polyps. CT renal stone study unremarkable.  Admission hemoglobin 5.3, requiring PRBC transfusion.  Urology team consulted.  Assessment & Plan:  Principal Problem:   Acute on chronic anemia Active Problems:   ESRD on dialysis [facility])   Essential hypertension   Hyperlipidemia   Hematuria   H/O sinus tachycardia   GERD (gastroesophageal reflux disease)   Cardiomyopathy (HCC)   Insulin  dependent type 2 diabetes mellitus (HCC)   History of renal transplant rejection   History of esophageal ulcer   Acute blood loss anemia   History of BPH   GAD (generalized anxiety disorder)     Hematuria with symptomatic anemia Anemia of chronic disease secondary to ESRD -Admission hemoglobin 5.3, baseline around 7.5.  Overnight UA looked grossly bloody and turbid.  CT renal study does not show any acute abnormality.  Hold off on anticoagulation.  After PRBC transfusion hemoglobin improved. - Discussed with urology, as long as he improves he will be referred back to outpatient [facility] for cystoscopy.  If worsens during this hospitalization, urology team will be available for official consult and intervention.  Appreciate their input.   History of chronic anemia secondary to ESRD Recent history of GI bleed 7/24 required EGD History of esophageal ulcer -Recently had episode of GI bleed  requiring admission from 7/22 - 7/24.  EGD during that admission showed esophageal ulcers and a recent prior colonoscopy on 7/1 at [facility] showed polyps in cecum and sigmoid colon.   History of BPH - Presently patient is not on Flomax. CT scan today showed no evidence of prostatomegaly.     ESRD on TTS schedule Failed renal transplant -ESRD on dialysis TTS schedule.  Still on immunosuppressive's -Nephrology -Continue prednisone , Prograf .  Does have history of noncompliance, cost prohibitive for him      Essential hypertension History of cardiomyopathy History of chronic sinus tachycardia Toprol -XL 25 mg daily IV as needed   History of GERD -Continue Protonix  40 mg twice daily.   Insulin -dependent DM type II - Continue Semglee  and sliding scale.  Adjust as necessary   Hyperlipidemia - Continue Vascepa  outpatient     Generalized anxiety disorder - Continue Zyprexa    DVT prophylaxis:  SCDs.  Code Status:  Full Code Family Communication:    Disposition Plan:    Subjective:  Seen at bedside.  Tells me he stopped taking Prograf  about a month ago due to cost.  Since then he makes about 100-300 cc urine daily.  Appears to be dark with some red tinge in it.   Examination:  General exam: Appears calm and comfortable  Respiratory system: Clear to auscultation. Respiratory effort normal. Cardiovascular system: S1 & S2 heard, RRR. No JVD, murmurs, rubs, gallops or clicks. No pedal edema. Gastrointestinal system: Abdomen is nondistended, soft and nontender. No organomegaly or masses felt. Normal bowel sounds heard. Central nervous system: Alert and oriented. No focal neurological deficits. Extremities: Symmetric 5 x 5 power. Skin: No rashes, lesions or ulcers Psychiatry: Judgement  and insight appear normal. Mood & affect appropriate.                Diet Orders (From admission, onward)     Start     Ordered   11/23/23 2227  Diet renal/carb modified with fluid  restriction Diet-HS Snack? Nothing; Fluid restriction: 1200 mL Fluid; Room service appropriate? Yes; Fluid consistency: Thin  Diet effective now       Question Answer Comment  Diet-HS Snack? Nothing   Fluid restriction: 1200 mL Fluid   Room service appropriate? Yes   Fluid consistency: Thin      11/23/23 2226            Objective: Vitals:   11/24/23 0603 11/24/23 0829 11/24/23 0945 11/24/23 0948  BP:   136/74 136/74  Pulse:   91 97  Resp:   (!) 26   Temp:  (!) 97.4 F (36.3 C)    TempSrc:  Oral    SpO2: 100%  100%   Weight:      Height:        Intake/Output Summary (Last 24 hours) at 11/24/2023 1124 Last data filed at 11/24/2023 0609 Gross per 24 hour  Intake 654.42 ml  Output --  Net 654.42 ml   Filed Weights   11/23/23 1821  Weight: 100 kg    Scheduled Meds:  docusate sodium   100 mg Oral BID   insulin  aspart  0-5 Units Subcutaneous QHS   insulin  aspart  0-9 Units Subcutaneous TID WC   insulin  glargine-yfgn  30 Units Subcutaneous QHS   metoprolol  succinate  25 mg Oral Daily   OLANZapine   5 mg Oral Daily   pantoprazole   40 mg Oral BID AC   predniSONE   5 mg Oral Daily   sodium chloride  flush  3 mL Intravenous Q12H   sodium chloride  flush  3 mL Intravenous Q12H   tacrolimus   2 mg Oral QHS   tacrolimus   3 mg Oral Daily   Continuous Infusions:  sodium chloride       Nutritional status     Body mass index is 31.63 kg/m.  Data Reviewed:   CBC: Recent Labs  Lab 11/23/23 2046 11/24/23 0751  WBC 6.6 7.5  NEUTROABS 5.2  --   HGB 5.3* 7.3*  HCT 17.3* 22.6*  MCV 87.4 86.6  PLT 343 374   Basic Metabolic Panel: Recent Labs  Lab 11/23/23 2046 11/24/23 0751  NA 131* 137  K 4.2 4.1  CL 91* 95*  CO2 23 26  GLUCOSE 273* 90  BUN 54* 58*  CREATININE 11.99* 12.93*  CALCIUM 7.5* 8.3*   GFR: Estimated Creatinine Clearance: 8.4 mL/min (A) (by C-G formula based on SCr of 12.93 mg/dL (H)). Liver Function Tests: Recent Labs  Lab 11/23/23 2046  11/24/23 0751  AST 44* 33  ALT 57* 48*  ALKPHOS 75 74  BILITOT 0.9 1.2  PROT 6.7 6.6  ALBUMIN 1.7* 1.6*   No results for input(s): LIPASE, AMYLASE in the last 168 hours. No results for input(s): AMMONIA in the last 168 hours. Coagulation Profile: No results for input(s): INR, PROTIME in the last 168 hours. Cardiac Enzymes: No results for input(s): CKTOTAL, CKMB, CKMBINDEX, TROPONINI in the last 168 hours. BNP (last 3 results) No results for input(s): PROBNP in the last 8760 hours. HbA1C: No results for input(s): HGBA1C in the last 72 hours. CBG: Recent Labs  Lab 11/24/23 0106 11/24/23 0745  GLUCAP 157* 90   Lipid Profile: No results for input(s): CHOL,  HDL, LDLCALC, TRIG, CHOLHDL, LDLDIRECT in the last 72 hours. Thyroid Function Tests: No results for input(s): TSH, T4TOTAL, FREET4, T3FREE, THYROIDAB in the last 72 hours. Anemia Panel: No results for input(s): VITAMINB12, FOLATE, FERRITIN, TIBC, IRON, RETICCTPCT in the last 72 hours. Sepsis Labs: No results for input(s): PROCALCITON, LATICACIDVEN in the last 168 hours.  No results found for this or any previous visit (from the past 240 hours).       Radiology Studies: CT Renal Stone Study Result Date: 11/23/2023 CLINICAL DATA:  Abdominal/flank pain.  Concern for kidney stone. EXAM: CT ABDOMEN AND PELVIS WITHOUT CONTRAST TECHNIQUE: Multidetector CT imaging of the abdomen and pelvis was performed following the standard protocol without IV contrast. RADIATION DOSE REDUCTION: This exam was performed according to the departmental dose-optimization program which includes automated exposure control, adjustment of the mA and/or kV according to patient size and/or use of iterative reconstruction technique. COMPARISON:  CT abdomen pelvis dated 11/04/2023. FINDINGS: Evaluation of this exam is limited in the absence of intravenous contrast. Lower chest: The visualized lung bases  are clear. No intra-abdominal free air or free fluid. Hepatobiliary: The liver is unremarkable. No biliary dilatation. The gallbladder is unremarkable Pancreas: Unremarkable. No pancreatic ductal dilatation or surrounding inflammatory changes. Spleen: Normal in size without focal abnormality. Adrenals/Urinary Tract: The adrenal glands unremarkable. Atrophic native kidneys. Several small renal cysts noted. There is no hydronephrosis on either side. 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 The urinary bladder is collapsed. Stomach/Bowel: There is mild sigmoid diverticulosis. There is no bowel obstruction or active inflammation. The appendix is normal. Vascular/Lymphatic: Mild aortoiliac atherosclerotic disease. The IVC is unremarkable. No portal venous gas. There is no adenopathy. Reproductive: The prostate and seminal vesicles are grossly unremarkable. No pelvic mass. 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 No acute osseous pathology. IMPRESSION: 1. No acute intra-abdominal or pelvic pathology. 2. 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 3. Mild sigmoid diverticulosis. No bowel obstruction. Normal appendix. 4.  Aortic Atherosclerosis (ICD10-I70.0). Electronically Signed   By: Vanetta Chou M.D.   On: 11/23/2023 21:33           LOS: 1 day   Time spent= 35 mins    Barry JAYSON Dare, MD [facility]  If 7PM-7AM, please contact night-coverage  11/24/2023, 11:24 AM

## 2023-11-24 NOTE — Plan of Care (Signed)

## 2023-11-24 NOTE — ED Notes (Signed)
 Pt been receiving blood can be stuck at 0808 for labs

## 2023-11-24 NOTE — Hospital Course (Addendum)
 Brief Narrative:   48 y.o. 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  Recent admission for GI bleed, EGD showing esophageal ulcer and colonoscopy showing multiple polyps. CT renal stone study unremarkable.  Admission hemoglobin 5.3, requiring PRBC transfusion.  Urology team consulted.  Assessment & Plan:  Principal Problem:   Acute on chronic anemia Active Problems:   ESRD on dialysis [facility])   Essential hypertension   Hyperlipidemia   Hematuria   H/O sinus tachycardia   GERD (gastroesophageal reflux disease)   Cardiomyopathy (HCC)   Insulin  dependent type 2 diabetes mellitus (HCC)   History of renal transplant rejection   History of esophageal ulcer   Acute blood loss anemia   History of BPH   GAD (generalized anxiety disorder)     Hematuria with symptomatic anemia Anemia of chronic disease secondary to ESRD -Admission hemoglobin 5.3, baseline around 7.5.  Overnight UA looked grossly bloody and turbid.  CT renal study does not show any acute abnormality.  Hold off on anticoagulation.  After PRBC transfusion hemoglobin improved. - Discussed with urology, as long as he improves he will be referred back to outpatient [facility] for cystoscopy.  If worsens during this hospitalization, urology team will be available for official consult and intervention.  Appreciate their input.   History of chronic anemia secondary to ESRD Recent history of GI bleed 7/24 required EGD History of esophageal ulcer -Recently had episode of GI bleed requiring admission from 7/22 - 7/24.  EGD during that admission showed esophageal ulcers and a recent prior colonoscopy on  7/1 at [facility] showed polyps in cecum and sigmoid colon.   History of BPH - Presently patient is not on Flomax. CT scan today showed no evidence of prostatomegaly.     ESRD on TTS schedule Failed renal transplant -ESRD on dialysis TTS schedule.  Still on immunosuppressive's -Nephrology -Continue prednisone , Prograf .  Does have history of noncompliance, cost prohibitive for him      Essential hypertension History of cardiomyopathy History of chronic sinus tachycardia Toprol -XL 25 mg daily IV as needed   History of GERD -Continue Protonix  40 mg twice daily.   Insulin -dependent DM type II - Continue Semglee  and sliding scale.  Adjust as necessary   Hyperlipidemia - Continue Vascepa  outpatient     Generalized anxiety disorder - Continue Zyprexa    DVT prophylaxis:  SCDs.  Code Status:  Full Code Family Communication:    Disposition Plan:    Subjective:  Seen at bedside.  Tells me he stopped taking Prograf  about a month ago due to cost.  Since then he makes about 100-300 cc urine daily.  Appears to be dark with some red tinge in it.   Examination:  General exam: Appears calm and comfortable  Respiratory system: Clear to auscultation. Respiratory effort normal. Cardiovascular system: S1 & S2 heard, RRR. No JVD, murmurs, rubs, gallops or clicks. No pedal edema. Gastrointestinal system: Abdomen is nondistended, soft and nontender. No organomegaly or masses felt. Normal bowel sounds heard. Central nervous system: Alert and oriented. No focal neurological deficits. Extremities: Symmetric 5 x 5 power. Skin: No rashes, lesions or ulcers Psychiatry: Judgement and insight appear normal. Mood & affect appropriate.

## 2023-11-24 NOTE — Consult Note (Signed)
 Renal Service Consult Note [facility]  AKUL LEGGETTE 11/24/2023 [name], MD Requesting Physician: Dr. Caleen  Reason for Consult: ESRD pt w/ symptomatic anemia HPI: The patient is a 48 y.o. year-old w/ PMH as below who presented to ED yesterday evening reporting a low hemoglobin of 6.2 which the dialysis unit called him about.  He had a full dialysis session on Monday.  Also he had noticed blood in his urine over the last 1 to 2 weeks.  In the ED blood pressure 140/80, heart rate 90, RR 17-21, temp 98.5, 100% on room air.  Labs showed potassium 4.2, BUN 54, creatinine 11, albumin 1.7, hemoglobin 5.3, WBC 6K.  A CT renal stone study was done which showed no acute pathology, similar appearance of RLQ renal transplant with findings of rejection, and no bowel obstruction.  Urine culture was sent.  Patient was admitted and given insulin , metoprolol , Zyprexa , PPI, prednisone  and Prograf .  We are asked to see for dialysis.  Pt seen in room. No c/o's today.    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, Bilateral   Migraine    when I was young (08/25/2016   Posterior equinus, acquired    Bilateral   Pre-diabetes    Past Surgical History  Past Surgical History:  Procedure Laterality Date   AV FISTULA PLACEMENT Left 10/15/2016   Procedure: ARTERIOVENOUS (AV) FISTULA CREATION-LEFT ARM;  Surgeon: Laurence Redell CROME, MD;  Location: [facility] OR;  Service: Vascular;  Laterality: Left;    DECOMPRESSION HIP-CORE Left 12/07/2022   Procedure: LEFT HIP CORE DECOMPRESSION WITH ILIAC CREST BONE MARROW ASPIRATE;  Surgeon: Genelle Standing, MD;  Location: [facility] OR;  Service: Orthopedics;  Laterality: Left;   ESOPHAGOGASTRODUODENOSCOPY N/A 11/11/2023   Procedure: EGD (ESOPHAGOGASTRODUODENOSCOPY);  Surgeon: Nandigam, Kavitha V, MD;  Location: [facility] ENDOSCOPY;  Service: Gastroenterology;  Laterality: N/A;   INSERTION OF DIALYSIS CATHETER Right 10/15/2016   Procedure: INSERTION OF DIALYSIS CATHETER;  Surgeon: Laurence Redell CROME, MD;  Location: [facility] OR;  Service: Vascular;  Laterality: Right;   KIDNEY TRANSPLANT  06/15/2020   [facility]   WISDOM TOOTH EXTRACTION     Family History  Family History  Problem Relation Age of Onset   Hypertension Father        Also maternal grandmother   Diabetes Father        also maternal grandmother   CVA Father    Cancer Maternal Grandfather        lung   Heart attack Maternal Grandfather    Social History  reports that he has never smoked. He has never used smokeless tobacco. He reports that he does not drink alcohol and does not use drugs. Allergies No Known Allergies Home medications Prior to Admission medications   Medication Sig Start Date End Date Taking? Authorizing Provider  AURYXIA 1 GM 210  MG(Fe) tablet TAKE 2 TABLETS BY MOUTH THREE TIMES A DAY WITH MEALS. SWALLOW WHOLE, DO NOT CHEW OR CRUSH MEDICATION   Yes [provider]  baclofen  (LIORESAL ) 10 MG tablet Take 10 mg by mouth daily as needed (for hiccups). 11/18/23 12/02/23 Yes [provider]  cinacalcet  (SENSIPAR ) 30 MG tablet Take 30 mg by mouth daily.   Yes [provider]  dorzolamide -timolol  (COSOPT ) 2-0.5 % ophthalmic solution Place 1 drop into both eyes 2 (two) times daily. 11/19/23  Yes [provider]  insulin  degludec (TRESIBA ) 200 UNIT/ML FlexTouch Pen Inject 30 Units into the skin at bedtime. 09/23/21  Yes [provider]  insulin  lispro  (HUMALOG KWIKPEN) 200 UNIT/ML KwikPen Inject 25-40 Units into the skin 3 (three) times daily as needed (high blood sugar). 09/02/20  Yes [provider]  metoprolol  succinate (TOPROL -XL) 25 MG 24 hr tablet Take 1 tablet (25 mg total) by mouth every evening. 11/11/23  Yes Ghimire, Donalda HERO, MD  OLANZapine  (ZYPREXA ) 5 MG tablet Take 1 tablet (5 mg total) by mouth daily. 11/12/23  Yes Ghimire, Donalda HERO, MD  pantoprazole  (PROTONIX ) 40 MG tablet Take 1 tablet (40 mg total) by mouth 2 (two) times daily before a meal. Take twice daily x 6 weeks, then switch to once daily. 11/11/23  Yes Ghimire, Donalda HERO, MD  predniSONE  (DELTASONE ) 5 MG tablet Take 5 mg by mouth daily.   Yes [provider]  sucralfate  (CARAFATE ) 1 GM/10ML suspension Take 1 g by mouth every 6 (six) hours. 11/18/23 12/18/23 Yes [provider]  sulfamethoxazole -trimethoprim  (BACTRIM ) 400-80 MG tablet Take 1 tablet by mouth every Monday, Wednesday, and Friday.   Yes [provider]  tacrolimus  (PROGRAF ) 1 MG capsule Take 2-3 mg by mouth See admin instructions. Take 3 capsules (3mg ) in the morning and then take 2 capsules (2mg ) at bedtime.   Yes [provider]  tadalafil (CIALIS) 20 MG tablet Take 20 mg by mouth daily as needed for erectile dysfunction.   Yes [provider]  traZODone  (DESYREL ) 50 MG tablet Take 50 mg by mouth at bedtime.   Yes [provider]  Vitamin D, Ergocalciferol, (DRISDOL) 1.25 MG (50000 UNIT) CAPS capsule Take 50,000 Units by mouth every 7 (seven) days. 03/02/23  Yes [provider]  icosapent  Ethyl (VASCEPA ) 1 g capsule Take 2 g by mouth 2 (two) times daily. Patient not taking: Reported on 11/23/2023    [provider]     Vitals:   11/24/23 9170 11/24/23 0945 11/24/23 0948 11/24/23 1232  BP:  136/74 136/74   Pulse:  91 97   Resp:  (!) 26    Temp: (!) 97.4 F (36.3 C)   97.7 F (36.5 C)  TempSrc: Oral   Axillary  SpO2:  100%     Weight:      Height:       Exam Gen alert, no distress, on RA No rash, cyanosis or gangrene Sclera anicteric, throat clear  No jvd or bruits Chest clear bilat to bases, no rales/ wheezing RRR no MRG Abd soft ntnd no mass or ascites +bs GU deferred MS no joint effusions or deformity Ext no LE or UE edema, no other edema Neuro is alert, Ox 3 , nf     LFA aVF+bruit   Relevant home meds: Toprol  xl 25 every day Prograf   3 mg am + 2mg  pm Prednisone  5mg  qd   [facility] HD[facility]  4h  B400  96.8kg  2K bath  AVF  Heparin  2000 Last HD 8/04, post wt 97.3kg WG 1.5- 4.5kg, getting to dry wt the last 2 wks Mircera 225 q 2 wks, last 7/25, due 8/8 Hectorol 6 mcg IV mwf  Last 3 Hb: 7/21= 8.2, 7/28= 7.6, 8/04 = 6.2     Assessment/ Plan: Symptomatic acute on chronic anemia: Hb 5.3 in ED, rec'd 2u prbc's overnight. Hb 7.3 this am. Per pmd.  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 Follow.  ESRD: on HD MWF. Plan HD today/ this evening.  HTN: BP's stable, cont toprol  xl every day.   Volume: looks euvolemic on exam, on chronic [place] O2. UF 2-3 L as tol w/ next HD.  Gross hematuria: per pmd Failed renal transplant: remains on pred / prograf , continue    Myer Fret  MD [facility] 11/24/2023, 1:28 PM  Recent Labs  Lab 11/23/23 2046 11/24/23 0751  HGB 5.3* 7.3*  ALBUMIN 1.7* 1.6*  CALCIUM 7.5* 8.3*  CREATININE 11.99* 12.93*  K 4.2 4.1   Inpatient medications:  docusate sodium   100 mg Oral BID   insulin  aspart  0-5 Units Subcutaneous QHS   insulin  aspart  0-9 Units Subcutaneous TID WC   insulin  glargine-yfgn  30 Units Subcutaneous QHS   metoprolol  succinate  25 mg Oral Daily   OLANZapine   5 mg Oral Daily   pantoprazole   40 mg Oral BID AC   predniSONE   5 mg Oral Daily   sodium chloride  flush  3 mL Intravenous Q12H   sodium chloride  flush  3 mL Intravenous Q12H   tacrolimus   2 mg Oral QHS   tacrolimus   3 mg Oral Daily    sodium chloride      sodium chloride ,  acetaminophen  **OR** acetaminophen , glucagon  (human recombinant), guaiFENesin , hydrALAZINE , ipratropium-albuterol , metoprolol  tartrate, ondansetron  **OR** ondansetron  (ZOFRAN ) IV, senna-docusate, sodium chloride  flush

## 2023-11-24 NOTE — ED Notes (Signed)
 Pt questioning about when he will go to dialysis I go Monday, Wednesday and Friday and I need to stay on my schedule Dialysis called and was told that he has no orders in and is not on the schedule for today. MD made aware

## 2023-11-25 DIAGNOSIS — D649 Anemia, unspecified: Secondary | ICD-10-CM | POA: Diagnosis not present

## 2023-11-25 LAB — BASIC METABOLIC PANEL WITH GFR
Anion gap: 13 (ref 5–15)
BUN: 39 mg/dL — ABNORMAL HIGH (ref 6–20)
CO2: 24 mmol/L (ref 22–32)
Calcium: 8.8 mg/dL — ABNORMAL LOW (ref 8.9–10.3)
Chloride: 96 mmol/L — ABNORMAL LOW (ref 98–111)
Creatinine, Ser: 8.54 mg/dL — ABNORMAL HIGH (ref 0.61–1.24)
GFR, Estimated: 7 mL/min — ABNORMAL LOW (ref >60)
Glucose, Bld: 205 mg/dL — ABNORMAL HIGH (ref 70–99)
Potassium: 4.3 mmol/L (ref 3.5–5.1)
Sodium: 133 mmol/L — ABNORMAL LOW (ref 135–145)

## 2023-11-25 LAB — CBC
HCT: 24 % — ABNORMAL LOW (ref 39.0–52.0)
Hemoglobin: 7.8 g/dL — ABNORMAL LOW (ref 13.0–17.0)
MCH: 28.1 pg (ref 26.0–34.0)
MCHC: 32.5 g/dL (ref 30.0–36.0)
MCV: 86.3 fL (ref 80.0–100.0)
Platelets: 428 K/uL — ABNORMAL HIGH (ref 150–400)
RBC: 2.78 MIL/uL — ABNORMAL LOW (ref 4.22–5.81)
RDW: 15.9 % — ABNORMAL HIGH (ref 11.5–15.5)
WBC: 7.2 K/uL (ref 4.0–10.5)
nRBC: 0 % (ref 0.0–0.2)

## 2023-11-25 LAB — BPAM RBC
Blood Product Expiration Date: 202508302359
Blood Product Expiration Date: 202508312359
ISSUE DATE / TIME: 202508052349
ISSUE DATE / TIME: 202508060254
Unit Type and Rh: 202508312359
Unit Type and Rh: 5100
Unit Type and Rh: 5100

## 2023-11-25 LAB — TYPE AND SCREEN
ABO/RH(D): O POS
Antibody Screen: NEGATIVE
Unit division: 0
Unit division: 0

## 2023-11-25 LAB — URINE CULTURE: Culture: NO GROWTH

## 2023-11-25 LAB — GLUCOSE, CAPILLARY
Glucose-Capillary: 156 mg/dL — ABNORMAL HIGH (ref 70–99)
Glucose-Capillary: 158 mg/dL — ABNORMAL HIGH (ref 70–99)

## 2023-11-25 LAB — MAGNESIUM: Magnesium: 1.8 mg/dL (ref 1.7–2.4)

## 2023-11-25 LAB — PHOSPHORUS: Phosphorus: 5.4 mg/dL — ABNORMAL HIGH (ref 2.5–4.6)

## 2023-11-25 MED ORDER — DORZOLAMIDE HCL-TIMOLOL MAL 2-0.5 % OP SOLN
1.0000 [drp] | Freq: Two times a day (BID) | OPHTHALMIC | Status: DC
  Administered 2023-11-25: 1 [drp] via OPHTHALMIC
  Filled 2023-11-25: qty 10

## 2023-11-25 MED ORDER — CHLORPROMAZINE HCL 10 MG PO TABS
10.0000 mg | ORAL_TABLET | Freq: Once | ORAL | Status: AC
  Administered 2023-11-25: 10 mg via ORAL
  Filled 2023-11-25: qty 1

## 2023-11-25 MED ORDER — CHLORPROMAZINE HCL 25 MG PO TABS
25.0000 mg | ORAL_TABLET | Freq: Three times a day (TID) | ORAL | Status: DC | PRN
  Administered 2023-11-25: 25 mg via ORAL
  Filled 2023-11-25 (×2): qty 1

## 2023-11-25 MED ORDER — DARBEPOETIN ALFA 200 MCG/0.4ML IJ SOSY: 200.0000 ug | PREFILLED_SYRINGE | INTRAMUSCULAR | Status: AC

## 2023-11-25 MED ORDER — SUCRALFATE 1 GM/10ML PO SUSP
1.0000 g | Freq: Four times a day (QID) | ORAL | Status: DC
  Administered 2023-11-25: 1 g via ORAL
  Filled 2023-11-25: qty 10

## 2023-11-25 NOTE — Plan of Care (Signed)

## 2023-11-25 NOTE — TOC Transition Note (Signed)
 Transition of Care [facility]) - Discharge Note   Patient Details  Name: Barry Nichols MRN: [id number] Date of Birth: 12/23/1975  [facility] CM/SW Contact:  Roxie KANDICE Stain, RN Phone Number: 11/25/2023, 11:50 AM   Clinical Narrative:    Patient stable for discharge. This RNCM will mail out the patient assistance information for Prograf  so patient can take to his doctors apt.    Final next level of care: Home/Self Care Barriers to Discharge: Barriers Resolved   Patient Goals and CMS Choice Patient states their goals for this hospitalization and ongoing recovery are:: return home          Discharge Placement             home          Discharge Plan and Services Additional resources added to the After Visit Summary for                                       Social Drivers of Health (SDOH) Interventions SDOH Screenings   Food Insecurity: No Food Insecurity (11/24/2023)  Housing: Low Risk  (11/24/2023)  Transportation Needs: No Transportation Needs (11/24/2023)  Utilities: Not At Risk (11/24/2023)  Tobacco Use: Low Risk  (11/23/2023)     Readmission Risk Interventions     No data to display

## 2023-11-25 NOTE — Progress Notes (Signed)
 [facility] Progress Note   Subjective:   Patient seen in his room this morning. He had hiccups that were causing him GI distress. He states that Thorazine  helps with his hiccups. He reports a wound in his LEE on his calf that is very tender to the touch. BP slightly high this morning. Patient had HD on 11/24/23 and they were able to UF 3L.   Objective Vitals:   11/24/23 2334 11/25/23 0021 11/25/23 0438 11/25/23 0750  BP: 128/71 (!) 142/90 135/77 (!) 143/98  Pulse: (!) 101 95 (!) 103 (!) 102  Resp: (!) 25 16 16 18   Temp: 98.7 F (37.1 C) 98.4 F (36.9 C) 99.5 F (37.5 C) 98 F (36.7 C)  TempSrc: Oral Oral Oral   SpO2: 100% 98% 95% 95%  Weight:      Height:       Exam Gen alert, no distress, on RA No rash, cyanosis or gangrene Sclera anicteric, throat clear  No jvd or bruits Chest clear bilat to bases, no rales/ wheezing RRR no MRG Abd soft ntnd no mass or ascites +bs GU deferred MS no joint effusions or deformity Ext no LE or UE edema, no other edema Neuro is alert, Ox 3 , nf     LFA aVF+bruit  Additional Objective Labs: Basic Metabolic Panel: Recent Labs  Lab 11/23/23 2046 11/24/23 0751 11/25/23 0635  NA 131* 137 133*  K 4.2 4.1 4.3  CL 91* 95* 96*  CO2 23 26 24   GLUCOSE 273* 90 205*  BUN 54* 58* 39*  CREATININE 11.99* 12.93* 8.54*  CALCIUM 7.5* 8.3* 8.8*  PHOS  --   --  5.4*   Liver Function Tests: Recent Labs  Lab 11/23/23 2046 11/24/23 0751  AST 44* 33  ALT 57* 48*  ALKPHOS 75 74  BILITOT 0.9 1.2  PROT 6.7 6.6  ALBUMIN 1.7* 1.6*    CBC: Recent Labs  Lab 11/23/23 2046 11/24/23 0751 11/25/23 0635  WBC 6.6 7.5 7.2  NEUTROABS 5.2  --   --   HGB 5.3* 7.3* 7.8*  HCT 17.3* 22.6* 24.0*  MCV 87.4 86.6 86.3  PLT 343 374 428*   CBG: Recent Labs  Lab 11/24/23 0745 11/24/23 1149 11/24/23 1608 11/25/23 0004 11/25/23 0751  GLUCAP 90 106* 134* 156* 158*   Medications:   Chlorhexidine  Gluconate Cloth  6 each Topical Q0600    [START ON 11/26/2023] darbepoetin (ARANESP ) injection - DIALYSIS  200 mcg Subcutaneous Q Wed-1800   docusate sodium   100 mg Oral BID   dorzolamide -timolol   1 drop Both Eyes BID   insulin  aspart  0-5 Units Subcutaneous QHS   insulin  aspart  0-9 Units Subcutaneous TID WC   insulin  glargine-yfgn  30 Units Subcutaneous QHS   metoprolol  succinate  25 mg Oral Daily   OLANZapine   5 mg Oral Daily   pantoprazole   40 mg Oral BID AC   predniSONE   5 mg Oral Daily   sodium chloride  flush  3 mL Intravenous Q12H   sodium chloride  flush  3 mL Intravenous Q12H   sucralfate   1 g Oral Q6H   tacrolimus   2 mg Oral QHS   tacrolimus   3 mg Oral Daily   Relevant home meds: Toprol  xl 25 every day Prograf   3 mg am + 2mg  pm Prednisone  5mg  qd    OP HD: [facility]  4h  B400  96.8kg   2K bath  AVF  Heparin  2000 Last HD 8/04, post wt 97.3kg WG 1.5-  4.5kg, getting to dry wt the last 2 wks Mircera 225 q 2 wks, last 7/25, due 8/8 Hectorol 6 mcg IV mwf  Last 3 Hb: 7/21= 8.2, 7/28= 7.6, 8/04 = 6.2        Assessment/ Plan: Symptomatic acute on chronic anemia: Hb 5.3 in ED, rec'd 2u prbc's overnight. Hb 7.8 this am. Per pmd.  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 Follow.  ESRD: on HD MWF. Plan HD today/ this evening.  HTN: BP's stable, cont toprol  xl every day.   Volume: looks euvolemic on exam, on chronic [place] O2. UF 2-3 L as tol w/ next HD.  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

## 2023-11-25 NOTE — Progress Notes (Signed)
 D/C order noted. Contacted [facility] to be advised of pt's d/c today and that pt should resume care tomorrow.   Randine Mungo Dialysis Navigator [phone]

## 2023-11-25 NOTE — Discharge Planning (Signed)
 [facility] Patient Discharge Orders - [facility] CLINIC: [facility]  Patient's name: Barry Nichols Admit/DC Dates: 11/23/2023 - 11/25/2023  DISCHARGE DIAGNOSES: Hematuria with symptomatic anemia:  hemoglobin 5.3, baseline around 7.5. Overnight UA looked grossly bloody and turbid. CT renal study does not show any acute abnormality. Hold off on anticoagulation. After PRBC transfusion hemoglobin improved. Hemoglobin today 7.8  ESRD on HD  HD ORDER CHANGES: Heparin  change: no heparin  EDW Change: no change  Bath Change: yes change to 2K/2Ca bath   ANEMIA MANAGEMENT: Aranesp : Given: no    ESA dose for discharge: mircera 225 mcg IV q 2 weeks, to start on 8/8 IV Iron dose at discharge: per protocol Transfusion: Given: yes - 2 units PRBC given  BONE/MINERAL MEDICATIONS: Hectorol/Calcitriol  change: hold VDRA due to Ca 10.7 Sensipar /Parsabiv change: per protocol  ACCESS INTERVENTION/CHANGE: no change Details:   RECENT LABS: Recent Labs  Lab 11/24/23 0751 11/25/23 0635  HGB 7.3* 7.8*  NA 137 133*  K 4.1 4.3  CALCIUM 8.3* 8.8*  PHOS  --  5.4*  ALBUMIN 1.6*  --     IV ANTIBIOTICS: no Details:   OTHER/APPTS/LAB ORDERS: please draw updated labs at next HD. Please hold all VDRA due to hypercalcemia until this normalizes. Please repeat Ca lab until Ca normalizes   D/C Meds to be reconciled by nurse after every discharge.  Completed By: Belvie Och, NP   Reviewed by: MD:______ RN_______

## 2023-11-25 NOTE — Discharge Summary (Addendum)
 Physician Discharge Summary  Barry Nichols FMW:[id number] DOB: [birth year] DOA: 11/23/2023  PCP: [name], PA-C  Admit date: 11/23/2023 Discharge date: 11/25/2023  Admitted From: Home Disposition: Home  Recommendations for Outpatient Follow-up:  Follow up with PCP in 1-2 weeks Please obtain BMP/CBC in one week your next doctors visit.  Recommend outpatient follow-up with urology for cystoscopy evaluation   Discharge Condition: Stable CODE STATUS: Full code Diet recommendation: Renal  Brief/Interim Summary: Brief Narrative:   48 y.o. 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  Recent admission for GI bleed, EGD showing esophageal ulcer and colonoscopy showing multiple polyps. CT renal stone study unremarkable.  Admission hemoglobin 5.3, requiring PRBC transfusion.  Posttransfusion hemoglobin improved without any further evidence of bleeding.  Case discussed with urology who recommended outpatient cystoscopy. Patient was dialyzed during this hospitalization which she tolerated well. Today medically stable for discharge.  Assessment & Plan:  Principal Problem:   Acute on chronic anemia Active Problems:   ESRD on dialysis [facility])   Essential hypertension   Hyperlipidemia   Hematuria   H/O sinus tachycardia   GERD (gastroesophageal reflux disease)   Cardiomyopathy (HCC)   Insulin  dependent type 2 diabetes mellitus (HCC)   History of renal transplant rejection   History of esophageal ulcer   Acute blood loss anemia   History of BPH   GAD (generalized anxiety disorder)     Hematuria with symptomatic anemia Anemia of chronic disease  secondary to ESRD -Admission hemoglobin 5.3, baseline around 7.5.  Overnight UA looked grossly bloody and turbid.  CT renal study does not show any acute abnormality.  Hold off on anticoagulation.  After PRBC transfusion hemoglobin improved.  Hemoglobin today 7.8.  Repeat outpatient with PCP in 1 week. - Discussed with urology, as long as he improves he will be referred back to outpatient [facility] for cystoscopy.  If worsens during this hospitalization, urology team will be available for official consult and intervention.  Appreciate their input.   History of chronic anemia secondary to ESRD Recent history of GI bleed 7/24 required EGD History of esophageal ulcer -Recently had episode of GI bleed requiring admission from 7/22 - 7/24.  EGD during that admission showed esophageal ulcers and a recent prior colonoscopy on 7/1 at [facility] showed polyps in cecum and sigmoid colon.   History of BPH - Presently patient is not on Flomax. CT scan today showed no evidence of prostatomegaly.     ESRD on TTS schedule Failed renal transplant -ESRD on dialysis TTS schedule.  Still on immunosuppressive's -Nephrology -Continue prednisone , Prograf .  Does have history of noncompliance, cost prohibitive for him but now he is figured out with his pharmacy to have to get it for $24 per month.     Essential hypertension History of cardiomyopathy History of chronic sinus tachycardia Toprol -XL 25 mg daily IV as needed   History of GERD -Continue Protonix  40 mg twice daily.   Insulin -dependent DM type II - Continue Semglee  and sliding scale.  Adjust as necessary   Hyperlipidemia - Continue Vascepa  outpatient     Generalized anxiety disorder - Continue Zyprexa    DVT prophylaxis:  SCDs.  Code Status:  Full Code Family Communication:    Disposition Plan:    Subjective:  Doing okay no complaints  Examination:  General exam: Appears calm and comfortable  Respiratory system: Clear to  auscultation. Respiratory effort normal. Cardiovascular system: S1 & S2 heard, RRR. No JVD, murmurs, rubs, gallops or clicks. No pedal edema. Gastrointestinal system: Abdomen is nondistended, soft and nontender. No organomegaly or masses felt. Normal bowel sounds heard. Central nervous system: Alert and oriented. No focal neurological deficits. Extremities: Symmetric 5 x 5 power. Skin: No rashes, lesions or ulcers Psychiatry: Judgement and insight appear normal. Mood & affect appropriate.    Discharge Diagnoses:  Principal Problem:   Acute on chronic anemia Active Problems:   ESRD on dialysis [facility])   Essential hypertension   Hyperlipidemia   Hematuria   H/O sinus tachycardia   GERD (gastroesophageal reflux disease)   Cardiomyopathy (HCC)   Insulin  dependent type 2 diabetes mellitus (HCC)   History of renal transplant rejection   History of esophageal ulcer   Acute blood loss anemia   History of BPH   GAD (generalized anxiety disorder)      Discharge Exam: Vitals:   11/25/23 0438 11/25/23 0750  BP: 135/77 (!) 143/98  Pulse: (!) 103 (!) 102  Resp: 16 18  Temp: 99.5 F (37.5 C) 98 F (36.7 C)  SpO2: 95% 95%   Vitals:   11/24/23 2334 11/25/23 0021 11/25/23 0438 11/25/23 0750  BP: 128/71 (!) 142/90 135/77 (!) 143/98  Pulse: (!) 101 95 (!) 103 (!) 102  Resp: (!) 25 16 16 18   Temp: 98.7 F (37.1 C) 98.4 F (36.9 C) 99.5 F (37.5 C) 98 F (36.7 C)  TempSrc: Oral Oral Oral   SpO2: 100% 98% 95% 95%  Weight:      Height:          Discharge Instructions   Allergies as of 11/25/2023   No Known Allergies      Medication List     TAKE these medications    cinacalcet  30 MG tablet Commonly known as: SENSIPAR  Take 30 mg by mouth daily.   Darbepoetin Alfa  200 MCG/0.4ML Sosy injection Commonly known as: ARANESP  Inject 0.4 mLs (200 mcg total) into the skin every Wednesday at 6 PM. Start taking on: November 26, 2023   dorzolamide -timolol  2-0.5 % ophthalmic  solution Commonly known as: COSOPT  Place 1 drop into both eyes 2 (two) times daily.   HumaLOG KwikPen 200 UNIT/ML KwikPen Generic drug: insulin  lispro Inject 25-40 Units into the skin 3 (three) times daily as needed (high blood sugar).   icosapent  Ethyl 1 g capsule Commonly known as: VASCEPA  Take 2 g by mouth 2 (two) times daily.   insulin  degludec 200 UNIT/ML FlexTouch Pen Commonly known as: TRESIBA  Inject 30 Units into the skin at bedtime.   metoprolol  succinate 25 MG 24 hr tablet Commonly known as: TOPROL -XL Take 1 tablet (25 mg total) by mouth every evening.   OLANZapine  5 MG tablet Commonly known as: ZYPREXA  Take 1 tablet (5 mg total) by mouth daily.   pantoprazole  40 MG tablet Commonly known as: PROTONIX  Take 1 tablet (40 mg total) by mouth 2 (two) times daily before a meal. Take twice daily x 6 weeks, then switch to once daily.   predniSONE  5 MG tablet Commonly known as: DELTASONE  Take 5 mg by mouth daily.   sucralfate  1 GM/10ML suspension Commonly known as: CARAFATE  Take 1 g by mouth every 6 (six) hours.   sulfamethoxazole -trimethoprim  400-80 MG tablet Commonly known as: BACTRIM  Take 1 tablet by mouth every Monday, Wednesday, and Friday.  tacrolimus  1 MG capsule Commonly known as: PROGRAF  Take 2-3 mg by mouth See admin instructions. Take 3 capsules (3mg ) in the morning and then take 2 capsules (2mg ) at bedtime.   tadalafil 20 MG tablet Commonly known as: CIALIS Take 20 mg by mouth daily as needed for erectile dysfunction.   traZODone  50 MG tablet Commonly known as: DESYREL  Take 50 mg by mouth at bedtime.   Vitamin D (Ergocalciferol) 1.25 MG (50000 UNIT) Caps capsule Commonly known as: DRISDOL Take 50,000 Units by mouth every 7 (seven) days.        Follow-up Information     Tammy Rasmussen T, PA-C Follow up in 1 week(s).   Specialty: Physician Assistant Contact information: [address] [place] [address] [phone]                 No Known Allergies  You were cared for by a hospitalist during your hospital stay. If you have any questions about your discharge medications or the care you received while you were in the hospital after you are discharged, you can call the unit and asked to speak with the hospitalist on call if the hospitalist that took care of you is not available. Once you are discharged, your primary care physician will handle any further medical issues. Please note that no refills for any discharge medications will be authorized once you are discharged, as it is imperative that you return to your primary care physician (or establish a relationship with a primary care physician if you do not have one) for your aftercare needs so that they can reassess your need for medications and monitor your lab values.  You were cared for by a hospitalist during your hospital stay. If you have any questions about your discharge medications or the care you received while you were in the hospital after you are discharged, you can call the unit and asked to speak with the hospitalist on call if the hospitalist that took care of you is not available. Once you are discharged, your primary care physician will handle any further medical issues. Please note that NO REFILLS for any discharge medications will be authorized once you are discharged, as it is imperative that you return to your primary care physician (or establish a relationship with a primary care physician if you do not have one) for your aftercare needs so that they can reassess your need for medications and monitor your lab values.  Please request your Prim.MD to go over all Hospital Tests and Procedure/Radiological results at the follow up, please get all Hospital records sent to your Prim MD by signing hospital release before you go home.  Get CBC, CMP, 2 view Chest X ray checked  by Primary MD during your next visit or SNF MD in 5-7 days ( we routinely  change or add medications that can affect your baseline labs and fluid status, therefore we recommend that you get the mentioned basic workup next visit with your PCP, your PCP may decide not to get them or add new tests based on their clinical decision)  On your next visit with your primary care physician please Get Medicines reviewed and adjusted.  If you experience worsening of your admission symptoms, develop shortness of breath, life threatening emergency, suicidal or homicidal thoughts you must seek medical attention immediately by calling 911 or calling your MD immediately  if symptoms less severe.  You Must read complete instructions/literature along with all the possible adverse reactions/side effects  for all the Medicines you take and that have been prescribed to you. Take any new Medicines after you have completely understood and accpet all the possible adverse reactions/side effects.   Do not drive, operate heavy machinery, perform activities at heights, swimming or participation in water activities or provide baby sitting services if your were admitted for syncope or siezures until you have seen by Primary MD or a Neurologist and advised to do so again.  Do not drive when taking Pain medications.   Procedures/Studies: CT Renal Stone Study Result Date: 11/23/2023 CLINICAL DATA:  Abdominal/flank pain.  Concern for kidney stone. EXAM: CT ABDOMEN AND PELVIS WITHOUT CONTRAST TECHNIQUE: Multidetector CT imaging of the abdomen and pelvis was performed following the standard protocol without IV contrast. RADIATION DOSE REDUCTION: This exam was performed according to the departmental dose-optimization program which includes automated exposure control, adjustment of the mA and/or kV according to patient size and/or use of iterative reconstruction technique. COMPARISON:  CT abdomen pelvis dated 11/04/2023. FINDINGS: Evaluation of this exam is limited in the absence of intravenous contrast. Lower  chest: The visualized lung bases are clear. No intra-abdominal free air or free fluid. Hepatobiliary: The liver is unremarkable. No biliary dilatation. The gallbladder is unremarkable Pancreas: Unremarkable. No pancreatic ductal dilatation or surrounding inflammatory changes. Spleen: Normal in size without focal abnormality. Adrenals/Urinary Tract: The adrenal glands unremarkable. Atrophic native kidneys. Several small renal cysts noted. There is no hydronephrosis on either side. 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 The urinary bladder is collapsed. Stomach/Bowel: There is mild sigmoid diverticulosis. There is no bowel obstruction or active inflammation. The appendix is normal. Vascular/Lymphatic: Mild aortoiliac atherosclerotic disease. The IVC is unremarkable. No portal venous gas. There is no adenopathy. Reproductive: The prostate and seminal vesicles are grossly unremarkable. No pelvic mass. 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 No acute osseous pathology. IMPRESSION: 1. No acute intra-abdominal or pelvic pathology. 2. 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 3. Mild sigmoid diverticulosis. No bowel obstruction. Normal appendix. 4.  Aortic Atherosclerosis (ICD10-I70.0). Electronically Signed   By: Vanetta Chou M.D.   On: 11/23/2023 21:33   CT CHEST WO CONTRAST Result Date: 11/10/2023 CLINICAL DATA:  Persistent cough. EXAM: CT CHEST WITHOUT CONTRAST TECHNIQUE: Multidetector CT imaging of the chest was performed following the standard protocol without IV contrast. RADIATION DOSE REDUCTION: This exam was performed according to the departmental dose-optimization program which includes automated exposure control, adjustment of the mA and/or kV according to patient size and/or use of iterative reconstruction technique.  COMPARISON:  Chest radiograph dated 11/09/2023. FINDINGS: Evaluation of this exam is limited in the absence of intravenous contrast. Cardiovascular: There is no cardiomegaly or pericardial effusion. There is coronary vascular calcification. Mild atherosclerotic calcification of the thoracic aorta. No intimal dilatation. The central pulmonary arteries are grossly unremarkable. Mediastinum/Nodes: No hilar or mediastinal adenopathy. The esophagus is grossly unremarkable. A 1.3 cm left thyroid calcified nodule. No mediastinal fluid collection. Lungs/Pleura: No focal consolidation, pleural effusion, pneumothorax. The central airways are patent. Upper Abdomen: No acute abnormality. Musculoskeletal: No acute osseous pathology. IMPRESSION: 1. No acute intrathoracic pathology. 2. Coronary vascular calcification. 3.  Aortic Atherosclerosis (ICD10-I70.0). Electronically Signed   By: Vanetta Chou M.D.   On: 11/10/2023 17:22   ECHOCARDIOGRAM COMPLETE Result Date: 11/10/2023    ECHOCARDIOGRAM REPORT   Patient Name:   TOBY BREITHAUPT  Date of Exam: 11/10/2023 Medical Rec #:  [id number]           Height:       70.0 in Accession #:    [id number]          Weight:       217.4 lb Date of Birth:  October 30, 1975           BSA:          2.163 m Patient Age:    48 years            BP:           132/89 mmHg Patient Gender: M                   HR:           105 bpm. Exam Location:  Inpatient Procedure: 2D Echo, Cardiac Doppler and Color Doppler (Both Spectral and Color            Flow Doppler were utilized during procedure). Indications:    Elevated Troponins  History:        Patient has no prior history of Echocardiogram examinations.                 ESRD; Risk Factors:Hypertension and Diabetes.  Sonographer:    Damien Senior RDCS Referring Phys: SUBRINA SUNDIL IMPRESSIONS  1. There is mid cavity acceleration of up to , unable to augment with Valsalva. Left ventricular ejection fraction, by estimation, is 65 to 70%. The left  ventricle has normal function. The left ventricle has no regional wall motion abnormalities. There is mild concentric left ventricular hypertrophy. Left ventricular diastolic parameters are consistent with Grade I diastolic dysfunction (impaired relaxation).  2. Right ventricular systolic function is normal. The right ventricular size is normal. Tricuspid regurgitation signal is inadequate for assessing PA pressure.  3. The mitral valve is normal in structure. Trivial mitral valve regurgitation.  4. The aortic valve has an indeterminant number of cusps. Aortic valve regurgitation is trivial.  5. The inferior vena cava is normal in size with greater than 50% respiratory variability, suggesting right atrial pressure of 3 mmHg. FINDINGS  Left Ventricle: There is mid cavity acceleration of up to , unable to augment with Valsalva. Left ventricular ejection fraction, by estimation, is 65 to 70%. The left ventricle has normal function. The left ventricle has no regional wall motion abnormalities. The left ventricular internal cavity size was normal in size. There is mild concentric left ventricular hypertrophy. Left ventricular diastolic parameters are consistent with Grade I diastolic dysfunction (impaired relaxation). Right Ventricle: The right ventricular size is normal. No increase in right ventricular wall thickness. Right ventricular systolic function is normal. Tricuspid regurgitation signal is inadequate for assessing PA pressure. Left [facility]: Left atrial size was normal in size. Right [facility]: Right atrial size was normal in size. Pericardium: There is no evidence of pericardial effusion. Mitral Valve: The mitral valve is normal in structure. Trivial mitral valve regurgitation. Tricuspid Valve: The tricuspid valve is normal in structure. Tricuspid valve regurgitation is trivial. Aortic Valve: The aortic valve has an indeterminant number of cusps. Aortic valve regurgitation is trivial. Pulmonic Valve: The  pulmonic valve was normal in structure. Pulmonic valve regurgitation is trivial. Aorta: The aortic root and ascending aorta are structurally normal, with no evidence of dilitation. Venous: The inferior vena cava is normal in size with greater than 50% respiratory variability, suggesting right atrial pressure of 3 mmHg. IAS/Shunts: No atrial level shunt  detected by color flow Doppler.  LEFT VENTRICLE PLAX 2D LVIDd:         4.60 cm   Diastology LVIDs:         3.20 cm   LV e' medial:    5.77 cm/s LV PW:         1.30 cm   LV E/e' medial:  10.8 LV IVS:        1.30 cm   LV e' lateral:   10.40 cm/s LVOT diam:     2.30 cm   LV E/e' lateral: 6.0 LV SV:         67 LV SV Index:   31 LVOT Area:     4.15 cm  RIGHT VENTRICLE RV S prime:     22.60 cm/s TAPSE (M-mode): 2.3 cm LEFT ATRIUM             Index        RIGHT ATRIUM           Index LA diam:        3.50 cm 1.62 cm/m   RA Area:     12.00 cm LA Vol (A2C):   38.9 ml 17.99 ml/m  RA Volume:   24.10 ml  11.14 ml/m LA Vol (A4C):   31.4 ml 14.52 ml/m LA Biplane Vol: 37.2 ml 17.20 ml/m  AORTIC VALVE LVOT Vmax:   112.00 cm/s LVOT Vmean:  85.500 cm/s LVOT VTI:    0.162 m  AORTA Ao Root diam: 3.40 cm Ao Asc diam:  3.30 cm MITRAL VALVE MV Area (PHT): 3.76 cm    SHUNTS MV Decel Time: 202 msec    Systemic VTI:  0.16 m MV E velocity: 62.30 cm/s  Systemic Diam: 2.30 cm MV A velocity: 87.60 cm/s MV E/A ratio:  0.71 Morene Brownie Electronically signed by Morene Brownie Signature Date/Time: 11/10/2023/12:39:26 PM    Final    DG Chest Portable 1 View Result Date: 11/09/2023 CLINICAL DATA:  SOB EXAM: PORTABLE CHEST 1 VIEW COMPARISON:  Chest x-ray 11/09/2023 FINDINGS: Prominence of the cardiac silhouette likely due to AP portable technique and low lung volumes. The heart and mediastinal contours unchanged. Atherosclerotic plaque Low lung volumes. No focal consolidation. No pulmonary edema. No pleural effusion. No pneumothorax. No acute osseous abnormality. IMPRESSION: Low lung  volumes with no active disease. Electronically Signed   By: Morgane  Naveau M.D.   On: 11/09/2023 19:21   CT ABDOMEN PELVIS WO CONTRAST Result Date: 11/04/2023 CLINICAL DATA:  Abdominal pain and elevated lipase, initial encounter EXAM: CT ABDOMEN AND PELVIS WITHOUT CONTRAST TECHNIQUE: Multidetector CT imaging of the abdomen and pelvis was performed following the standard protocol without IV contrast. RADIATION DOSE REDUCTION: This exam was performed according to the departmental dose-optimization program which includes automated exposure control, adjustment of the mA and/or kV according to patient size and/or use of iterative reconstruction technique. COMPARISON:  05/12/2023 FINDINGS: Lower chest: No acute abnormality. Hepatobiliary: No focal liver abnormality is seen. No gallstones, gallbladder wall thickening, or biliary dilatation. Pancreas: Pancreas is well visualized. No significant inflammatory changes are identified. Spleen: Normal in size without focal abnormality. Adrenals/Urinary Tract: Adrenal glands are within normal limits. Kidneys demonstrate bilateral renal cystic change stable from the prior study. No follow-up is recommended. No renal calculi or obstructive changes are seen. Right lower quadrant renal transplant is noted. Some perinephric stranding is noted stable from the prior exam. The renal sinus is not well visualized likely related to diffuse inflammatory change. By clinical history  biopsy of this kidney showed transplant rejection changes in February of 2025. The bladder is decompressed. Stomach/Bowel: No obstructive or inflammatory changes of the colon are noted. The appendix is within normal limits. Small bowel and stomach are unremarkable. Vascular/Lymphatic: Aortic atherosclerosis. No enlarged abdominal or pelvic lymph nodes. Reproductive: Prostate is unremarkable. Other: No abdominal wall hernia or abnormality. No abdominopelvic ascites. Musculoskeletal: No acute or significant  osseous findings. IMPRESSION: Diffuse inflammatory changes and loss of renal sinus fat in the transplant kidney consistent with the known history of rejection. Chronic changes. No evidence of pancreatitis. Electronically Signed   By: Oneil Devonshire M.D.   On: 11/04/2023 23:14     The results of significant diagnostics from this hospitalization (including imaging, microbiology, ancillary and laboratory) are listed below for reference.     Microbiology: No results found for this or any previous visit (from the past 240 hours).   Labs: BNP (last 3 results) No results for input(s): BNP in the last 8760 hours. Basic Metabolic Panel: Recent Labs  Lab 11/23/23 2046 11/24/23 0751 11/25/23 0635  NA 131* 137 133*  K 4.2 4.1 4.3  CL 91* 95* 96*  CO2 23 26 24   GLUCOSE 273* 90 205*  BUN 54* 58* 39*  CREATININE 11.99* 12.93* 8.54*  CALCIUM 7.5* 8.3* 8.8*  MG  --   --  1.8  PHOS  --   --  5.4*   Liver Function Tests: Recent Labs  Lab 11/23/23 2046 11/24/23 0751  AST 44* 33  ALT 57* 48*  ALKPHOS 75 74  BILITOT 0.9 1.2  PROT 6.7 6.6  ALBUMIN 1.7* 1.6*   No results for input(s): LIPASE, AMYLASE in the last 168 hours. No results for input(s): AMMONIA in the last 168 hours. CBC: Recent Labs  Lab 11/23/23 2046 11/24/23 0751 11/25/23 0635  WBC 6.6 7.5 7.2  NEUTROABS 5.2  --   --   HGB 5.3* 7.3* 7.8*  HCT 17.3* 22.6* 24.0*  MCV 87.4 86.6 86.3  PLT 343 374 428*   Cardiac Enzymes: No results for input(s): CKTOTAL, CKMB, CKMBINDEX, TROPONINI in the last 168 hours. BNP: Invalid input(s): POCBNP CBG: Recent Labs  Lab 11/24/23 0745 11/24/23 1149 11/24/23 1608 11/25/23 0004 11/25/23 0751  GLUCAP 90 106* 134* 156* 158*   D-Dimer No results for input(s): DDIMER in the last 72 hours. Hgb A1c No results for input(s): HGBA1C in the last 72 hours. Lipid Profile No results for input(s): CHOL, HDL, LDLCALC, TRIG, CHOLHDL, LDLDIRECT in the last 72  hours. Thyroid function studies No results for input(s): TSH, T4TOTAL, T3FREE, THYROIDAB in the last 72 hours.  Invalid input(s): FREET3 Anemia work up No results for input(s): VITAMINB12, FOLATE, FERRITIN, TIBC, IRON, RETICCTPCT in the last 72 hours. Urinalysis    Component Value Date/Time   COLORURINE RED (A) 11/23/2023 1949   APPEARANCEUR TURBID (A) 11/23/2023 1949   LABSPEC  11/23/2023 1949    TEST NOT REPORTED DUE TO COLOR INTERFERENCE OF URINE PIGMENT   PHURINE  11/23/2023 1949    TEST NOT REPORTED DUE TO COLOR INTERFERENCE OF URINE PIGMENT   GLUCOSEU (A) 11/23/2023 1949    TEST NOT REPORTED DUE TO COLOR INTERFERENCE OF URINE PIGMENT   HGBUR (A) 11/23/2023 1949    TEST NOT REPORTED DUE TO COLOR INTERFERENCE OF URINE PIGMENT   BILIRUBINUR (A) 11/23/2023 1949    TEST NOT REPORTED DUE TO COLOR INTERFERENCE OF URINE PIGMENT   BILIRUBINUR neg 09/24/2012 1059   KETONESUR (A) 11/23/2023 1949  TEST NOT REPORTED DUE TO COLOR INTERFERENCE OF URINE PIGMENT   PROTEINUR (A) 11/23/2023 1949    TEST NOT REPORTED DUE TO COLOR INTERFERENCE OF URINE PIGMENT   UROBILINOGEN 0.2 09/24/2012 1059   UROBILINOGEN 0.2 09/11/2011 0843   NITRITE (A) 11/23/2023 1949    TEST NOT REPORTED DUE TO COLOR INTERFERENCE OF URINE PIGMENT   LEUKOCYTESUR (A) 11/23/2023 1949    TEST NOT REPORTED DUE TO COLOR INTERFERENCE OF URINE PIGMENT   Sepsis Labs Recent Labs  Lab 11/23/23 2046 11/24/23 0751 11/25/23 0635  WBC 6.6 7.5 7.2   Microbiology No results found for this or any previous visit (from the past 240 hours).   Time coordinating discharge:  I have spent 35 minutes face to face with the patient and on the ward discussing the patients care, assessment, plan and disposition with other care givers. >50% of the time was devoted counseling the patient about the risks and benefits of treatment/Discharge disposition and coordinating care.   SIGNED:   Burgess JAYSON Dare, MD  [facility] 11/25/2023, 11:03 AM   If 7PM-7AM, please contact night-coverage

## 2023-11-27 NOTE — TOC Transition Note (Signed)
 Transition of Care - Initial Contact after Hospitalization  Date of discharge: 11/25/2023  Date of contact: 11/27/23  Method: Phone Spoke to: Patient  Patient contacted to discuss transition of care from recent inpatient hospitalization. Patient was admitted to [facility] from 11/23/23 to 11/25/23 discharge diagnosis of hematuria with symptomatic anemia  Patient informs me of LLE wound which is causing him pain. Reviewed last Nephrology note and its possible this may be a calciphylaxis picture. Plan for the renal team to assess his wound in outpatient. If there is a concern for calciphylaxis, will place him on Na Thiosulfate. In the meantime, avoid Ca supplements for now.  Patient will return to his outpatient HD unit on: Monday 11/29/23 at [facility].  Charmaine Piety, NP

## 2023-12-01 ENCOUNTER — Encounter (HOSPITAL_COMMUNITY): Payer: Self-pay

## 2023-12-01 ENCOUNTER — Emergency Department (HOSPITAL_COMMUNITY)
Admission: EM | Admit: 2023-12-01 | Discharge: 2023-12-01 | Disposition: A | Discharge status: Home / Self Care | Source: Ambulatory Visit

## 2023-12-01 ENCOUNTER — Other Ambulatory Visit: Payer: Self-pay

## 2023-12-01 DIAGNOSIS — N186 End stage renal disease: Secondary | ICD-10-CM | POA: Insufficient documentation

## 2023-12-01 DIAGNOSIS — E1165 Type 2 diabetes mellitus with hyperglycemia: Secondary | ICD-10-CM | POA: Diagnosis not present

## 2023-12-01 DIAGNOSIS — D649 Anemia, unspecified: Secondary | ICD-10-CM

## 2023-12-01 DIAGNOSIS — Z992 Dependence on renal dialysis: Secondary | ICD-10-CM | POA: Insufficient documentation

## 2023-12-01 DIAGNOSIS — D500 Iron deficiency anemia secondary to blood loss (chronic): Secondary | ICD-10-CM | POA: Insufficient documentation

## 2023-12-01 DIAGNOSIS — E1122 Type 2 diabetes mellitus with diabetic chronic kidney disease: Secondary | ICD-10-CM | POA: Insufficient documentation

## 2023-12-01 DIAGNOSIS — Z794 Long term (current) use of insulin: Secondary | ICD-10-CM | POA: Insufficient documentation

## 2023-12-01 LAB — CBC
HCT: 23.3 % — ABNORMAL LOW (ref 39.0–52.0)
Hemoglobin: 7.2 g/dL — ABNORMAL LOW (ref 13.0–17.0)
MCH: 26.9 pg (ref 26.0–34.0)
MCHC: 30.9 g/dL (ref 30.0–36.0)
MCV: 86.9 fL (ref 80.0–100.0)
Platelets: 420 K/uL — ABNORMAL HIGH (ref 150–400)
RBC: 2.68 MIL/uL — ABNORMAL LOW (ref 4.22–5.81)
RDW: 16.5 % — ABNORMAL HIGH (ref 11.5–15.5)
WBC: 7 K/uL (ref 4.0–10.5)
nRBC: 0 % (ref 0.0–0.2)

## 2023-12-01 LAB — COMPREHENSIVE METABOLIC PANEL WITH GFR
ALT: 62 U/L — ABNORMAL HIGH (ref 0–44)
AST: 53 U/L — ABNORMAL HIGH (ref 15–41)
Albumin: 1.8 g/dL — ABNORMAL LOW (ref 3.5–5.0)
Alkaline Phosphatase: 91 U/L (ref 38–126)
Anion gap: 20 — ABNORMAL HIGH (ref 5–15)
BUN: 26 mg/dL — ABNORMAL HIGH (ref 6–20)
CO2: 24 mmol/L (ref 22–32)
Calcium: 8.8 mg/dL — ABNORMAL LOW (ref 8.9–10.3)
Chloride: 92 mmol/L — ABNORMAL LOW (ref 98–111)
Creatinine, Ser: 6.75 mg/dL — ABNORMAL HIGH (ref 0.61–1.24)
GFR, Estimated: 9 mL/min — ABNORMAL LOW (ref >60)
Glucose, Bld: 238 mg/dL — ABNORMAL HIGH (ref 70–99)
Potassium: 4.1 mmol/L (ref 3.5–5.1)
Sodium: 136 mmol/L (ref 135–145)
Total Bilirubin: 0.7 mg/dL (ref 0.0–1.2)
Total Protein: 7.5 g/dL (ref 6.5–8.1)

## 2023-12-01 LAB — PROTIME-INR
INR: 1.4 — ABNORMAL HIGH (ref 0.8–1.2)
Prothrombin Time: 17.9 s — ABNORMAL HIGH (ref 11.4–15.2)

## 2023-12-01 NOTE — Discharge Instructions (Addendum)
 Continue monitoring your symptoms, if you become increasingly lightheaded, or you are losing a larger amount of blood then I want you to return to the emergency department.  Continue taking your Protonix  at home.  Continue checking your hemoglobin at dialysis and keep your follow-up appointments with the urologist and GI doctor.

## 2023-12-01 NOTE — ED Provider Notes (Signed)
 [place] EMERGENCY DEPARTMENT AT [facility] Provider Note   CSN: [id number] Arrival date & time: 12/01/23  1231     Patient presents with: Abnormal Lab   Barry Nichols is a 48 y.o. male with past medical history significant for ESRD on dialysis, diabetes, avascular necrosis, anxiety, acute blood loss anemia, esophageal ulcer, blood in urine who presents with concern for low hemoglobin.  Patient reports not symptomatic at this time compared to when he was just admitted a week ago.  He denies feeling lightheaded.  He was told that his hemoglobin was 6.4.  He reports that he does not need dialyzed he would not like to stay in the hospital at this time.  He reports that he has a follow-up appointment with urology at the beginning of September.  He reports he has been compliant with taking Protonix .  He reports no significant change in the quality of his stool.    Abnormal Lab      Prior to Admission medications   Medication Sig Start Date End Date Taking? Authorizing Provider  cinacalcet  (SENSIPAR ) 30 MG tablet Take 30 mg by mouth daily.    [provider]  Darbepoetin Alfa  (ARANESP ) 200 MCG/0.4ML SOSY injection Inject 0.4 mLs (200 mcg total) into the skin every Wednesday at 6 PM. 11/26/23   Amin, Ankit C, MD  dorzolamide -timolol  (COSOPT ) 2-0.5 % ophthalmic solution Place 1 drop into both eyes 2 (two) times daily. 11/19/23   [provider]  icosapent  Ethyl (VASCEPA ) 1 g capsule Take 2 g by mouth 2 (two) times daily. Patient not taking: Reported on 11/23/2023    [provider]  insulin  degludec (TRESIBA ) 200 UNIT/ML FlexTouch Pen Inject 30 Units into the skin at bedtime. 09/23/21   [provider]  insulin  lispro (HUMALOG KWIKPEN) 200 UNIT/ML KwikPen Inject 25-40 Units into the skin 3 (three) times daily as needed (high blood sugar). 09/02/20   [provider]  metoprolol  succinate (TOPROL -XL) 25 MG 24 hr tablet Take 1 tablet (25 mg  total) by mouth every evening. 11/11/23   Ghimire, Donalda HERO, MD  OLANZapine  (ZYPREXA ) 5 MG tablet Take 1 tablet (5 mg total) by mouth daily. 11/12/23   Ghimire, Donalda HERO, MD  pantoprazole  (PROTONIX ) 40 MG tablet Take 1 tablet (40 mg total) by mouth 2 (two) times daily before a meal. Take twice daily x 6 weeks, then switch to once daily. 11/11/23   Ghimire, Donalda HERO, MD  predniSONE  (DELTASONE ) 5 MG tablet Take 5 mg by mouth daily.    [provider]  sucralfate  (CARAFATE ) 1 GM/10ML suspension Take 1 g by mouth every 6 (six) hours. 11/18/23 12/18/23  [provider]  sulfamethoxazole -trimethoprim  (BACTRIM ) 400-80 MG tablet Take 1 tablet by mouth every Monday, Wednesday, and Friday.    [provider]  tacrolimus  (PROGRAF ) 1 MG capsule Take 2-3 mg by mouth See admin instructions. Take 3 capsules (3mg ) in the morning and then take 2 capsules (2mg ) at bedtime.    [provider]  tadalafil (CIALIS) 20 MG tablet Take 20 mg by mouth daily as needed for erectile dysfunction.    [provider]  traZODone  (DESYREL ) 50 MG tablet Take 50 mg by mouth at bedtime.    [provider]  Vitamin D, Ergocalciferol, (DRISDOL) 1.25 MG (50000 UNIT) CAPS capsule Take 50,000 Units by mouth every 7 (seven) days. 03/02/23   [provider]    Allergies: Patient has no known allergies.    Review of  Systems  All other systems reviewed and are negative.   Updated Vital Signs BP 139/84 (BP Location: Left Arm)   Pulse (!) 112   Temp 99.9 F (37.7 C)   Resp 18   Ht 5' 10 (1.778 m)   Wt 97.1 kg   SpO2 95%   BMI 30.71 kg/m   Physical Exam Vitals and nursing note reviewed.  Constitutional:      General: He is not in acute distress.    Appearance: Normal appearance.  HENT:     Head: Normocephalic and atraumatic.  Eyes:     General:        Right eye: No discharge.        Left eye: No discharge.  Cardiovascular:     Rate and Rhythm: Regular rhythm.  Tachycardia present.     Heart sounds: No murmur heard.    No friction rub. No gallop.     Comments: Tachycardia on arrival somewhat improved on recheck Pulmonary:     Effort: Pulmonary effort is normal.     Breath sounds: Normal breath sounds.  Abdominal:     General: Bowel sounds are normal.     Palpations: Abdomen is soft.  Skin:    General: Skin is warm and dry.     Capillary Refill: Capillary refill takes less than 2 seconds.  Neurological:     Mental Status: He is alert and oriented to person, place, and time.  Psychiatric:        Mood and Affect: Mood normal.        Behavior: Behavior normal.     (all labs ordered are listed, but only abnormal results are displayed) Labs Reviewed  PROTIME-INR - Abnormal; Notable for the following components:      Result Value   Prothrombin Time 17.9 (*)    INR 1.4 (*)    All other components within normal limits  COMPREHENSIVE METABOLIC PANEL WITH GFR - Abnormal; Notable for the following components:   Chloride 92 (*)    Glucose, Bld 238 (*)    BUN 26 (*)    Creatinine, Ser 6.75 (*)    Calcium 8.8 (*)    Albumin 1.8 (*)    AST 53 (*)    ALT 62 (*)    GFR, Estimated 9 (*)    Anion gap 20 (*)    All other components within normal limits  CBC - Abnormal; Notable for the following components:   RBC 2.68 (*)    Hemoglobin 7.2 (*)    HCT 23.3 (*)    RDW 16.5 (*)    Platelets 420 (*)    All other components within normal limits  TYPE AND SCREEN    EKG: None  Radiology: No results found.   Procedures   Medications Ordered in the ED - No data to display                                  Medical Decision Making  This patient is a 48 y.o. male  who presents to the ED for concern of anemia.   Differential diagnoses prior to evaluation: The emergent differential diagnosis includes, but is not limited to, acute blood loss anemia, symptomatic anemia, anemia requiring transfusion, acute GI bleed, blood in urine, versus  other. This is not an exhaustive differential.   Past Medical History / Co-morbidities / Social History: ESRD on dialysis, diabetes, avascular necrosis, anxiety, acute  blood loss anemia, esophageal ulcer, blood in urine   Additional history: Chart reviewed. Pertinent results include: Extensively reviewed lab work, imaging from recent hospitalization, including trending hemoglobins, his baseline hemoglobin is around mid 7 normally, but had dropped to around 5 at time of last admission.  Physical Exam: Physical exam performed. The pertinent findings include: Vital signs with mildly elevated temp at 99.9, mild tachycardia, pulse of 112, improved on recheck.  Normal oxygen saturation, normal blood pressure  Lab Tests/Imaging studies: I personally interpreted labs/imaging and the pertinent results include: CBC notable for anemia, hemoglobin 7.2, which is fairly stable compared to his baseline, not 6.4 as reported at dialysis.  CMP with elevated glucose 238, BUN, creatinine elevated, consistent with known ESRD on dialysis, he does have some anion gap 20, suspect likely related to uremia.   Medications: I had a shared decision-making conversation with the patient, given that he has a known esophageal ulcer, known blood in urine, close follow-up appointments, and does not meet criteria for transfusion at this time he declines any additional treatment or intervention, reports that he will continue his home medications, outpatient follow-up appointments, and return if he has significant worsening bleeding, shortness of breath, lightheadedness, feeling like he is going to pass out.  Extensive return precautions understood by the patient.  Discharged in stable condition at this time.   Disposition: After consideration of the diagnostic results and the patients response to treatment, I feel that patient stable for discharge with plan as above.   emergency department workup does not suggest an emergent  condition requiring admission or immediate intervention beyond what has been performed at this time. The plan is: as above. The patient is safe for discharge and has been instructed to return immediately for worsening symptoms, change in symptoms or any other concerns.  Final diagnoses:  Symptomatic anemia    ED Discharge Orders     None          Rosan Sherlean DEL, PA-C 12/01/23 1612    Gennaro Bouchard L, DO 12/01/23 2248

## 2023-12-01 NOTE — ED Provider Triage Note (Signed)
 Emergency Medicine Provider Triage Evaluation Note  Barry Nichols , a 48 y.o. male  was evaluated in triage.  Pt complains of fatigue, low hgb found at HD today.   48 y.o. 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  Pt was recently admitted for anemia in the setting of hematuria. Seems he's also had black stools.    Review of Systems  Positive: Fatigue, weak Negative: Fever   Physical Exam  BP 139/84 (BP Location: Left Arm)   Pulse (!) 112   Temp 99.9 F (37.7 C)   Resp 18   Ht 5' 10 (1.778 m)   Wt 97.1 kg   SpO2 95%   BMI 30.71 kg/m  Gen:   Awake, no distress   Resp:  Normal effort MSK:   Moves extremities without difficulty  Other:  Soft abd  Medical Decision Making  Medically screening exam initiated at 1:07 PM.  Appropriate orders placed.  Barry Nichols was informed that the remainder of the evaluation will be completed by another provider, this initial triage assessment does not replace that evaluation, and the importance of remaining in the ED until their evaluation is complete.  [address]    Barry Nichols Barry Nichols, Barry Nichols 12/01/23 1308

## 2023-12-01 NOTE — Group Note (Deleted)
 Date:  12/01/2023 Time:  2:22 PM  Group Topic/Focus:  Wellness Toolbox:   The focus of this group is to discuss various aspects of wellness, balancing those aspects and exploring ways to increase the ability to experience wellness.  Patients will create a wellness toolbox for use upon discharge.     Participation Level:  {BHH PARTICIPATION OZCZO:77735}  Participation Quality:  {BHH PARTICIPATION QUALITY:22265}  Affect:  {BHH AFFECT:22266}  Cognitive:  {BHH COGNITIVE:22267}  Insight: {BHH Insight2:20797}  Engagement in Group:  {BHH ENGAGEMENT IN HMNLE:77731}  Modes of Intervention:  {BHH MODES OF INTERVENTION:22269}  Additional Comments:  ***  Myra Curtistine BROCKS 12/01/2023, 2:22 PM

## 2023-12-01 NOTE — ED Triage Notes (Signed)
 Pt bib POV with mother c/o low hgb. Pt was seen at dialysis who informed pt hgb in the 6's. Pt states he has had dark stools and blood in urine.   Pt states he is up to date with HD Tx

## 2023-12-02 ENCOUNTER — Inpatient Hospital Stay (HOSPITAL_COMMUNITY)
Admission: EM | Admit: 2023-12-02 | Discharge: 2023-12-05 | DRG: 380 | Disposition: A | Discharge status: Home / Self Care | Attending: Internal Medicine | Admitting: Internal Medicine

## 2023-12-02 ENCOUNTER — Other Ambulatory Visit: Payer: Self-pay

## 2023-12-02 DIAGNOSIS — Z992 Dependence on renal dialysis: Secondary | ICD-10-CM

## 2023-12-02 DIAGNOSIS — I132 Hypertensive heart and chronic kidney disease with heart failure and with stage 5 chronic kidney disease, or end stage renal disease: Secondary | ICD-10-CM | POA: Diagnosis present

## 2023-12-02 DIAGNOSIS — R5383 Other fatigue: Secondary | ICD-10-CM | POA: Insufficient documentation

## 2023-12-02 DIAGNOSIS — R319 Hematuria, unspecified: Secondary | ICD-10-CM | POA: Diagnosis present

## 2023-12-02 DIAGNOSIS — K922 Gastrointestinal hemorrhage, unspecified: Secondary | ICD-10-CM | POA: Insufficient documentation

## 2023-12-02 DIAGNOSIS — Z7960 Long term (current) use of unspecified immunomodulators and immunosuppressants: Secondary | ICD-10-CM

## 2023-12-02 DIAGNOSIS — I10 Essential (primary) hypertension: Secondary | ICD-10-CM | POA: Diagnosis present

## 2023-12-02 DIAGNOSIS — Z801 Family history of malignant neoplasm of trachea, bronchus and lung: Secondary | ICD-10-CM

## 2023-12-02 DIAGNOSIS — Z833 Family history of diabetes mellitus: Secondary | ICD-10-CM

## 2023-12-02 DIAGNOSIS — Z7952 Long term (current) use of systemic steroids: Secondary | ICD-10-CM

## 2023-12-02 DIAGNOSIS — K2100 Gastro-esophageal reflux disease with esophagitis, without bleeding: Secondary | ICD-10-CM | POA: Diagnosis present

## 2023-12-02 DIAGNOSIS — N2581 Secondary hyperparathyroidism of renal origin: Secondary | ICD-10-CM | POA: Diagnosis present

## 2023-12-02 DIAGNOSIS — Z79899 Other long term (current) drug therapy: Secondary | ICD-10-CM

## 2023-12-02 DIAGNOSIS — Z823 Family history of stroke: Secondary | ICD-10-CM

## 2023-12-02 DIAGNOSIS — R7401 Elevation of levels of liver transaminase levels: Secondary | ICD-10-CM | POA: Diagnosis present

## 2023-12-02 DIAGNOSIS — R310 Gross hematuria: Secondary | ICD-10-CM | POA: Diagnosis present

## 2023-12-02 DIAGNOSIS — N186 End stage renal disease: Principal | ICD-10-CM | POA: Diagnosis present

## 2023-12-02 DIAGNOSIS — D649 Anemia, unspecified: Secondary | ICD-10-CM | POA: Diagnosis present

## 2023-12-02 DIAGNOSIS — N400 Enlarged prostate without lower urinary tract symptoms: Secondary | ICD-10-CM | POA: Diagnosis present

## 2023-12-02 DIAGNOSIS — T8612 Kidney transplant failure: Secondary | ICD-10-CM | POA: Diagnosis present

## 2023-12-02 DIAGNOSIS — G9341 Metabolic encephalopathy: Secondary | ICD-10-CM | POA: Diagnosis present

## 2023-12-02 DIAGNOSIS — Z940 Kidney transplant status: Secondary | ICD-10-CM

## 2023-12-02 DIAGNOSIS — Z8249 Family history of ischemic heart disease and other diseases of the circulatory system: Secondary | ICD-10-CM

## 2023-12-02 DIAGNOSIS — M1990 Unspecified osteoarthritis, unspecified site: Secondary | ICD-10-CM | POA: Diagnosis present

## 2023-12-02 DIAGNOSIS — I5032 Chronic diastolic (congestive) heart failure: Secondary | ICD-10-CM | POA: Diagnosis present

## 2023-12-02 DIAGNOSIS — R531 Weakness: Secondary | ICD-10-CM | POA: Diagnosis not present

## 2023-12-02 DIAGNOSIS — E1122 Type 2 diabetes mellitus with diabetic chronic kidney disease: Secondary | ICD-10-CM | POA: Diagnosis present

## 2023-12-02 DIAGNOSIS — D631 Anemia in chronic kidney disease: Secondary | ICD-10-CM | POA: Diagnosis present

## 2023-12-02 DIAGNOSIS — E1129 Type 2 diabetes mellitus with other diabetic kidney complication: Secondary | ICD-10-CM | POA: Diagnosis present

## 2023-12-02 DIAGNOSIS — Z794 Long term (current) use of insulin: Secondary | ICD-10-CM

## 2023-12-02 DIAGNOSIS — K2211 Ulcer of esophagus with bleeding: Secondary | ICD-10-CM | POA: Diagnosis not present

## 2023-12-02 DIAGNOSIS — I429 Cardiomyopathy, unspecified: Secondary | ICD-10-CM | POA: Diagnosis present

## 2023-12-02 DIAGNOSIS — D62 Acute posthemorrhagic anemia: Secondary | ICD-10-CM | POA: Diagnosis present

## 2023-12-02 LAB — URINALYSIS, ROUTINE W REFLEX MICROSCOPIC

## 2023-12-02 LAB — CBC
HCT: 21.5 % — ABNORMAL LOW (ref 39.0–52.0)
Hemoglobin: 6.6 g/dL — CL (ref 13.0–17.0)
MCH: 26.4 pg (ref 26.0–34.0)
MCHC: 30.7 g/dL (ref 30.0–36.0)
MCV: 86 fL (ref 80.0–100.0)
Platelets: 408 K/uL — ABNORMAL HIGH (ref 150–400)
RBC: 2.5 MIL/uL — ABNORMAL LOW (ref 4.22–5.81)
RDW: 17.2 % — ABNORMAL HIGH (ref 11.5–15.5)
WBC: 7.2 K/uL (ref 4.0–10.5)
nRBC: 0 % (ref 0.0–0.2)

## 2023-12-02 LAB — I-STAT VENOUS BLOOD GAS, ED
Acid-Base Excess: 3 mmol/L — ABNORMAL HIGH (ref 0.0–2.0)
Bicarbonate: 27.2 mmol/L (ref 20.0–28.0)
Calcium, Ion: 1.04 mmol/L — ABNORMAL LOW (ref 1.15–1.40)
HCT: 19 % — ABNORMAL LOW (ref 39.0–52.0)
Hemoglobin: 6.5 g/dL — CL (ref 13.0–17.0)
O2 Saturation: 83 %
Potassium: 4.6 mmol/L (ref 3.5–5.1)
Sodium: 133 mmol/L — ABNORMAL LOW (ref 135–145)
TCO2: 28 mmol/L (ref 22–32)
pCO2, Ven: 36.9 mmHg — ABNORMAL LOW (ref 44–60)
pH, Ven: 7.475 — ABNORMAL HIGH (ref 7.25–7.43)
pO2, Ven: 44 mmHg (ref 32–45)

## 2023-12-02 LAB — URINALYSIS, MICROSCOPIC (REFLEX): RBC / HPF: >50 RBC/hpf (ref 0–5)

## 2023-12-02 LAB — COMPREHENSIVE METABOLIC PANEL WITH GFR
ALT: 61 U/L — ABNORMAL HIGH (ref 0–44)
AST: 52 U/L — ABNORMAL HIGH (ref 15–41)
Albumin: 1.8 g/dL — ABNORMAL LOW (ref 3.5–5.0)
Alkaline Phosphatase: 84 U/L (ref 38–126)
Anion gap: 22 — ABNORMAL HIGH (ref 5–15)
BUN: 52 mg/dL — ABNORMAL HIGH (ref 6–20)
CO2: 23 mmol/L (ref 22–32)
Calcium: 8.9 mg/dL (ref 8.9–10.3)
Chloride: 91 mmol/L — ABNORMAL LOW (ref 98–111)
Creatinine, Ser: 10.44 mg/dL — ABNORMAL HIGH (ref 0.61–1.24)
GFR, Estimated: 6 mL/min — ABNORMAL LOW (ref >60)
Glucose, Bld: 174 mg/dL — ABNORMAL HIGH (ref 70–99)
Potassium: 4.3 mmol/L (ref 3.5–5.1)
Sodium: 136 mmol/L (ref 135–145)
Total Bilirubin: 0.7 mg/dL (ref 0.0–1.2)
Total Protein: 7 g/dL (ref 6.5–8.1)

## 2023-12-02 LAB — TYPE AND SCREEN
ABO/RH(D): O POS
Antibody Screen: NEGATIVE

## 2023-12-02 LAB — PREPARE RBC (CROSSMATCH)

## 2023-12-02 MED ORDER — CHLORHEXIDINE GLUCONATE CLOTH 2 % EX PADS
6.0000 | MEDICATED_PAD | Freq: Every day | CUTANEOUS | Status: DC
  Administered 2023-12-03 – 2023-12-05 (×3): 6 via TOPICAL

## 2023-12-02 MED ORDER — SODIUM CHLORIDE 0.9% IV SOLUTION: Freq: Once | INTRAVENOUS | Status: AC

## 2023-12-02 NOTE — ED Provider Notes (Signed)
 [place] EMERGENCY DEPARTMENT AT [facility] Provider Note   CSN: [id number] Arrival date & time: 12/02/23  1350     Patient presents with: Rectal Bleeding and Hematuria   Barry Nichols is a 48 y.o. male.   Patient is a dialysis patient.  He gets dialyzed Monday Wednesdays and Fridays was dialyzed yesterday.  Has a history of diabetes avascular necrosis anxiety acute blood loss anemia esophageal ulcer blood in urine who presents with concern for low hemoglobin yesterday was seen it was still in the sevens so they decided not to transfuse him.  After dialysis I thought his hemoglobin was 6.4 yesterday that is why they sent him in.  He reports that he has follow-up with urology beginning of September patient is been taking his Protonix .  Patient states he is followed by [facility].  Patient's had blood in his bowel movements now for several weeks.  Not worse here lately.  In addition patient's been very drowsy.  Past medical history significant for hypertension congestive heart failure chronic kidney disease and diabetes.  Patient with recent admission and hospitalist admission patient was admitted August 5 through August 7.  Patient according to their notes failed renal transplant as the patient told me admitted for hematuria and symptomatic anemia recent admission for GI bleed EGD showing esophageal ulcer and colonoscopy showing multiple polyps.  CT scan renal study was unremarkable hemoglobin was 5.3 at that time requiring packed red blood platelet transfusion.  Posttransfusion hemoglobin improved without any further bleeding.  So very similar presentation to today.     Prior to Admission medications   Medication Sig Start Date End Date Taking? Authorizing Provider  cinacalcet  (SENSIPAR ) 30 MG tablet Take 30 mg by mouth daily.   Yes [provider]  Darbepoetin Alfa  (ARANESP ) 200 MCG/0.4ML SOSY injection Inject 0.4 mLs (200 mcg total) into the skin  every Wednesday at 6 PM. 11/26/23   Amin, Ankit C, MD  dorzolamide -timolol  (COSOPT ) 2-0.5 % ophthalmic solution Place 1 drop into both eyes 2 (two) times daily. 11/19/23   [provider]  icosapent  Ethyl (VASCEPA ) 1 g capsule Take 2 g by mouth 2 (two) times daily. Patient not taking: Reported on 11/23/2023    [provider]  insulin  degludec (TRESIBA ) 200 UNIT/ML FlexTouch Pen Inject 30 Units into the skin at bedtime. 09/23/21   [provider]  insulin  lispro (HUMALOG KWIKPEN) 200 UNIT/ML KwikPen Inject 25-40 Units into the skin 3 (three) times daily as needed (high blood sugar). 09/02/20   [provider]  metoprolol  succinate (TOPROL -XL) 25 MG 24 hr tablet Take 1 tablet (25 mg total) by mouth every evening. 11/11/23   Ghimire, Donalda HERO, MD  OLANZapine  (ZYPREXA ) 5 MG tablet Take 1 tablet (5 mg total) by mouth daily. 11/12/23   Ghimire, Donalda HERO, MD  pantoprazole  (PROTONIX ) 40 MG tablet Take 1 tablet (40 mg total) by mouth 2 (two) times daily before a meal. Take twice daily x 6 weeks, then switch to once daily. 11/11/23   Ghimire, Donalda HERO, MD  predniSONE  (DELTASONE ) 5 MG tablet Take 5 mg by mouth daily.    [provider]  sucralfate  (CARAFATE ) 1 GM/10ML suspension Take 1 g by mouth every 6 (six) hours. 11/18/23 12/18/23  [provider]  sulfamethoxazole -trimethoprim  (BACTRIM ) 400-80 MG tablet Take 1 tablet by mouth every Monday, Wednesday, and Friday.    [provider]  tacrolimus  (PROGRAF ) 1 MG capsule Take 2-3 mg by mouth See admin instructions. Take  3 capsules (3mg ) in the morning and then take 2 capsules (2mg ) at bedtime.    [provider]  tadalafil (CIALIS) 20 MG tablet Take 20 mg by mouth daily as needed for erectile dysfunction.    [provider]  traZODone  (DESYREL ) 50 MG tablet Take 50 mg by mouth at bedtime.    [provider]  Vitamin D, Ergocalciferol, (DRISDOL) 1.25 MG (50000 UNIT) CAPS capsule Take  50,000 Units by mouth every 7 (seven) days. 03/02/23   [provider]    Allergies: Patient has no known allergies.    Review of Systems  Constitutional:  Negative for chills and fever.  HENT:  Negative for ear pain and sore throat.   Eyes:  Negative for pain and visual disturbance.  Respiratory:  Negative for cough and shortness of breath.   Cardiovascular:  Negative for chest pain and palpitations.  Gastrointestinal:  Positive for blood in stool. Negative for abdominal pain and vomiting.  Genitourinary:  Positive for hematuria. Negative for dysuria.  Musculoskeletal:  Negative for arthralgias and back pain.  Skin:  Negative for color change and rash.  Neurological:  Negative for seizures and syncope.  All other systems reviewed and are negative.   Updated Vital Signs BP (!) 158/82   Pulse 95   Temp 98.8 F (37.1 C)   Resp (!) 23   Ht 1.778 m (5' 10)   Wt 97.5 kg   SpO2 100%   BMI 30.85 kg/m   Physical Exam Vitals and nursing note reviewed.  Constitutional:      General: He is not in acute distress.    Appearance: Normal appearance. He is well-developed.  HENT:     Head: Normocephalic and atraumatic.  Eyes:     Extraocular Movements: Extraocular movements intact.     Conjunctiva/sclera: Conjunctivae normal.     Pupils: Pupils are equal, round, and reactive to light.  Cardiovascular:     Rate and Rhythm: Normal rate and regular rhythm.     Heart sounds: No murmur heard. Pulmonary:     Effort: Pulmonary effort is normal. No respiratory distress.     Breath sounds: Normal breath sounds.  Abdominal:     Palpations: Abdomen is soft.     Tenderness: There is no abdominal tenderness.  Genitourinary:    Rectum: Guaiac result positive.     Comments: Stool sort of charcoal in color. Musculoskeletal:        General: No swelling.     Cervical back: Normal range of motion and neck supple.     Comments: AV fistula left arm.  Skin:    General: Skin is warm and  dry.     Capillary Refill: Capillary refill takes less than 2 seconds.  Neurological:     General: No focal deficit present.     Mental Status: He is alert and oriented to person, place, and time.  Psychiatric:        Mood and Affect: Mood normal.     (all labs ordered are listed, but only abnormal results are displayed) Labs Reviewed  CBC - Abnormal; Notable for the following components:      Result Value   RBC 2.50 (*)    Hemoglobin 6.6 (*)    HCT 21.5 (*)    RDW 17.2 (*)    Platelets 408 (*)    All other components within normal limits  COMPREHENSIVE METABOLIC PANEL WITH GFR - Abnormal; Notable for the following components:   Chloride 91 (*)  Glucose, Bld 174 (*)    BUN 52 (*)    Creatinine, Ser 10.44 (*)    Albumin 1.8 (*)    AST 52 (*)    ALT 61 (*)    GFR, Estimated 6 (*)    Anion gap 22 (*)    All other components within normal limits  URINALYSIS, ROUTINE W REFLEX MICROSCOPIC - Abnormal; Notable for the following components:   Color, Urine RED (*)    APPearance TURBID (*)    Glucose, UA   (*)    Value: TEST NOT REPORTED DUE TO COLOR INTERFERENCE OF URINE PIGMENT   Hgb urine dipstick   (*)    Value: TEST NOT REPORTED DUE TO COLOR INTERFERENCE OF URINE PIGMENT   Bilirubin Urine   (*)    Value: TEST NOT REPORTED DUE TO COLOR INTERFERENCE OF URINE PIGMENT   Ketones, ur   (*)    Value: TEST NOT REPORTED DUE TO COLOR INTERFERENCE OF URINE PIGMENT   Protein, ur   (*)    Value: TEST NOT REPORTED DUE TO COLOR INTERFERENCE OF URINE PIGMENT   Nitrite   (*)    Value: TEST NOT REPORTED DUE TO COLOR INTERFERENCE OF URINE PIGMENT   Leukocytes,Ua   (*)    Value: TEST NOT REPORTED DUE TO COLOR INTERFERENCE OF URINE PIGMENT   All other components within normal limits  URINALYSIS, MICROSCOPIC (REFLEX) - Abnormal; Notable for the following components:   Bacteria, UA RARE (*)    All other components within normal limits  I-STAT VENOUS BLOOD GAS, ED - Abnormal; Notable for the  following components:   pH, Ven 7.475 (*)    pCO2, Ven 36.9 (*)    Acid-Base Excess 3.0 (*)    Sodium 133 (*)    Calcium, Ion 1.04 (*)    HCT 19.0 (*)    Hemoglobin 6.5 (*)    All other components within normal limits  POC OCCULT BLOOD, ED  TYPE AND SCREEN  PREPARE RBC (CROSSMATCH)    EKG: EKG Interpretation Date/Time:  Thursday December 02 2023 15:27:15 EDT Ventricular Rate:  93 PR Interval:  126 QRS Duration:  86 QT Interval:  358 QTC Calculation: 445 R Axis:   139  Text Interpretation: Suspect arm lead reversal, interpretation assumes no reversal Unusual P axis, possible ectopic atrial rhythm with Premature atrial complexes Right ventricular hypertrophy Septal infarct , age undetermined T wave abnormality, consider inferior ischemia Abnormal ECG No previous ECGs available Confirmed by Geraldene Hamilton [phone]) on 12/02/2023 7:36:30 PM  Radiology: No results found.   Procedures   Medications Ordered in the ED  0.9 %  sodium chloride  infusion (Manually program via Guardrails IV Fluids) (has no administration in time range)                                    Medical Decision Making Risk Decision regarding hospitalization.  CRITICAL CARE Performed by: Verleen Stuckey Total critical care time: 45 minutes Critical care time was exclusive of separately billable procedures and treating other patients. Critical care was necessary to treat or prevent imminent or life-threatening deterioration. Critical care was time spent personally by me on the following activities: development of treatment plan with patient and/or surrogate as well as nursing, discussions with consultants, evaluation of patient's response to treatment, examination of patient, obtaining history from patient or surrogate, ordering and performing treatments and interventions, ordering and review of  laboratory studies, ordering and review of radiographic studies, pulse oximetry and re-evaluation of patient's  condition.  Rectal exam stool is kind of charcoal in color.  Discussed with on-call nephrology.  They said go ahead and transfuse though will dialyze as needed tomorrow.  Will contact hospitalist for admission again.  Patient was admitted August 5 for similar things.  I do think that the hematuria and the GI bleed may be chronic.  But with his hemoglobin being 6.5 nephrology is recommending blood transfusion   Final diagnoses:  End stage renal disease on dialysis [facility])  Hematuria, unspecified type  Gastrointestinal hemorrhage, unspecified gastrointestinal hemorrhage type    ED Discharge Orders     None          Geraldene Hamilton, MD 12/02/23 2116

## 2023-12-02 NOTE — ED Notes (Signed)
 Pt advised their CBG monitor went off and it read 255, made sort RN aware.

## 2023-12-02 NOTE — ED Triage Notes (Signed)
 Pt reports brain fog and low hgb. Seen yesterday for same. Bloody stools and hematuria reported.

## 2023-12-02 NOTE — ED Provider Triage Note (Signed)
 Emergency Medicine Provider Triage Evaluation Note  Barry Nichols , a 48 y.o. male  was evaluated in triage.  Pt complains of worsening weakness, brain fog.  Known anemia, continues losing large volume of blood in urine.  Grossly erythematous, bloody urine on arrival today.  Was recently admitted for anemia requiring transfusion, seen yesterday and due to hemoglobin being above threshold for transfusion we had reached a shared decision to discharge with close return precautions, patient reports that he has been feeling worse since then and continues to lose blood.  Review of Systems  Positive: Weakness, brain fog, hematuria Negative:   Physical Exam  BP (!) 153/99 (BP Location: Right Arm)   Pulse (!) 108   Temp 98.5 F (36.9 C) (Oral)   Resp 20   SpO2 96%  Gen:   Awake, no distress   Resp:  Normal effort  MSK:   Moves extremities without difficulty  Other:  Mild tachycardia, normal rhythm  Medical Decision Making  Medically screening exam initiated at 2:28 PM.  Appropriate orders placed.  GLORIA LAMBERTSON was informed that the remainder of the evaluation will be completed by another provider, this initial triage assessment does not replace that evaluation, and the importance of remaining in the ED until their evaluation is complete.  Workup initiated in triage    Rosan Sherlean DEL, [place] 12/02/23 1431

## 2023-12-03 ENCOUNTER — Encounter (HOSPITAL_COMMUNITY): Payer: Self-pay | Admitting: Internal Medicine

## 2023-12-03 ENCOUNTER — Observation Stay (HOSPITAL_COMMUNITY)

## 2023-12-03 DIAGNOSIS — Z823 Family history of stroke: Secondary | ICD-10-CM | POA: Diagnosis not present

## 2023-12-03 DIAGNOSIS — R7401 Elevation of levels of liver transaminase levels: Secondary | ICD-10-CM

## 2023-12-03 DIAGNOSIS — R531 Weakness: Secondary | ICD-10-CM | POA: Diagnosis present

## 2023-12-03 DIAGNOSIS — K921 Melena: Secondary | ICD-10-CM

## 2023-12-03 DIAGNOSIS — D649 Anemia, unspecified: Secondary | ICD-10-CM

## 2023-12-03 DIAGNOSIS — Z794 Long term (current) use of insulin: Secondary | ICD-10-CM | POA: Diagnosis not present

## 2023-12-03 DIAGNOSIS — R5383 Other fatigue: Secondary | ICD-10-CM | POA: Insufficient documentation

## 2023-12-03 DIAGNOSIS — Z8249 Family history of ischemic heart disease and other diseases of the circulatory system: Secondary | ICD-10-CM | POA: Diagnosis not present

## 2023-12-03 DIAGNOSIS — D631 Anemia in chronic kidney disease: Secondary | ICD-10-CM | POA: Diagnosis present

## 2023-12-03 DIAGNOSIS — E1122 Type 2 diabetes mellitus with diabetic chronic kidney disease: Secondary | ICD-10-CM | POA: Diagnosis present

## 2023-12-03 DIAGNOSIS — Z833 Family history of diabetes mellitus: Secondary | ICD-10-CM | POA: Diagnosis not present

## 2023-12-03 DIAGNOSIS — T8612 Kidney transplant failure: Secondary | ICD-10-CM | POA: Diagnosis present

## 2023-12-03 DIAGNOSIS — Z7960 Long term (current) use of unspecified immunomodulators and immunosuppressants: Secondary | ICD-10-CM | POA: Diagnosis not present

## 2023-12-03 DIAGNOSIS — K922 Gastrointestinal hemorrhage, unspecified: Secondary | ICD-10-CM | POA: Insufficient documentation

## 2023-12-03 DIAGNOSIS — Z79899 Other long term (current) drug therapy: Secondary | ICD-10-CM | POA: Diagnosis not present

## 2023-12-03 DIAGNOSIS — I429 Cardiomyopathy, unspecified: Secondary | ICD-10-CM | POA: Diagnosis present

## 2023-12-03 DIAGNOSIS — E1129 Type 2 diabetes mellitus with other diabetic kidney complication: Secondary | ICD-10-CM

## 2023-12-03 DIAGNOSIS — R319 Hematuria, unspecified: Secondary | ICD-10-CM

## 2023-12-03 DIAGNOSIS — N186 End stage renal disease: Secondary | ICD-10-CM

## 2023-12-03 DIAGNOSIS — N400 Enlarged prostate without lower urinary tract symptoms: Secondary | ICD-10-CM | POA: Diagnosis present

## 2023-12-03 DIAGNOSIS — K2100 Gastro-esophageal reflux disease with esophagitis, without bleeding: Secondary | ICD-10-CM | POA: Diagnosis present

## 2023-12-03 DIAGNOSIS — I5032 Chronic diastolic (congestive) heart failure: Secondary | ICD-10-CM | POA: Diagnosis present

## 2023-12-03 DIAGNOSIS — Z992 Dependence on renal dialysis: Secondary | ICD-10-CM

## 2023-12-03 DIAGNOSIS — G9341 Metabolic encephalopathy: Secondary | ICD-10-CM | POA: Diagnosis present

## 2023-12-03 DIAGNOSIS — I132 Hypertensive heart and chronic kidney disease with heart failure and with stage 5 chronic kidney disease, or end stage renal disease: Secondary | ICD-10-CM | POA: Diagnosis present

## 2023-12-03 DIAGNOSIS — K3189 Other diseases of stomach and duodenum: Secondary | ICD-10-CM | POA: Diagnosis not present

## 2023-12-03 DIAGNOSIS — M1990 Unspecified osteoarthritis, unspecified site: Secondary | ICD-10-CM | POA: Diagnosis present

## 2023-12-03 DIAGNOSIS — D62 Acute posthemorrhagic anemia: Secondary | ICD-10-CM | POA: Diagnosis present

## 2023-12-03 DIAGNOSIS — K2211 Ulcer of esophagus with bleeding: Secondary | ICD-10-CM | POA: Diagnosis present

## 2023-12-03 DIAGNOSIS — K760 Fatty (change of) liver, not elsewhere classified: Secondary | ICD-10-CM

## 2023-12-03 DIAGNOSIS — N2581 Secondary hyperparathyroidism of renal origin: Secondary | ICD-10-CM | POA: Diagnosis present

## 2023-12-03 DIAGNOSIS — Z7952 Long term (current) use of systemic steroids: Secondary | ICD-10-CM | POA: Diagnosis not present

## 2023-12-03 DIAGNOSIS — R310 Gross hematuria: Secondary | ICD-10-CM | POA: Diagnosis present

## 2023-12-03 LAB — GLUCOSE, CAPILLARY
Glucose-Capillary: 102 mg/dL — ABNORMAL HIGH (ref 70–99)
Glucose-Capillary: 105 mg/dL — ABNORMAL HIGH (ref 70–99)
Glucose-Capillary: 122 mg/dL — ABNORMAL HIGH (ref 70–99)
Glucose-Capillary: 129 mg/dL — ABNORMAL HIGH (ref 70–99)
Glucose-Capillary: 156 mg/dL — ABNORMAL HIGH (ref 70–99)
Glucose-Capillary: 99 mg/dL (ref 70–99)

## 2023-12-03 LAB — CBC WITH DIFFERENTIAL/PLATELET
Abs Immature Granulocytes: 0.06 K/uL (ref 0.00–0.07)
Basophils Absolute: 0 K/uL (ref 0.0–0.1)
Basophils Relative: 0 %
Eosinophils Absolute: 0.2 K/uL (ref 0.0–0.5)
Eosinophils Relative: 3 %
HCT: 25.4 % — ABNORMAL LOW (ref 39.0–52.0)
Hemoglobin: 8.1 g/dL — ABNORMAL LOW (ref 13.0–17.0)
Immature Granulocytes: 1 %
Lymphocytes Relative: 16 %
Lymphs Abs: 1.2 K/uL (ref 0.7–4.0)
MCH: 27.2 pg (ref 26.0–34.0)
MCHC: 31.9 g/dL (ref 30.0–36.0)
MCV: 85.2 fL (ref 80.0–100.0)
Monocytes Absolute: 0.6 K/uL (ref 0.1–1.0)
Monocytes Relative: 8 %
Neutro Abs: 5.3 K/uL (ref 1.7–7.7)
Neutrophils Relative %: 72 %
Platelets: 359 K/uL (ref 150–400)
RBC: 2.98 MIL/uL — ABNORMAL LOW (ref 4.22–5.81)
RDW: 16.3 % — ABNORMAL HIGH (ref 11.5–15.5)
WBC: 7.4 K/uL (ref 4.0–10.5)
nRBC: 0 % (ref 0.0–0.2)

## 2023-12-03 LAB — HEPATITIS B SURFACE ANTIGEN: Hepatitis B Surface Ag: NONREACTIVE

## 2023-12-03 LAB — AMMONIA: Ammonia: 33 umol/L (ref 9–35)

## 2023-12-03 MED ORDER — METOPROLOL SUCCINATE ER 25 MG PO TB24
25.0000 mg | ORAL_TABLET | Freq: Every evening | ORAL | Status: DC
  Administered 2023-12-03 – 2023-12-04 (×2): 25 mg via ORAL
  Filled 2023-12-03 (×2): qty 1

## 2023-12-03 MED ORDER — DIPHENHYDRAMINE HCL 25 MG PO CAPS
25.0000 mg | ORAL_CAPSULE | Freq: Once | ORAL | Status: AC
  Administered 2023-12-03: 25 mg via ORAL
  Filled 2023-12-03: qty 1

## 2023-12-03 MED ORDER — SODIUM CHLORIDE 0.9 % IV SOLN: INTRAVENOUS | Status: AC

## 2023-12-03 MED ORDER — SULFAMETHOXAZOLE-TRIMETHOPRIM 400-80 MG PO TABS
1.0000 | ORAL_TABLET | ORAL | Status: DC
  Administered 2023-12-03: 1 via ORAL
  Filled 2023-12-03: qty 1

## 2023-12-03 MED ORDER — TACROLIMUS 1 MG PO CAPS
3.0000 mg | ORAL_CAPSULE | Freq: Every morning | ORAL | Status: DC
Start: 2023-12-03 — End: 2023-12-05
  Administered 2023-12-03 – 2023-12-05 (×3): 3 mg via ORAL
  Filled 2023-12-03 (×3): qty 3

## 2023-12-03 MED ORDER — ACETAMINOPHEN 325 MG PO TABS: 650.0000 mg | ORAL_TABLET | Freq: Four times a day (QID) | ORAL | Status: DC | PRN

## 2023-12-03 MED ORDER — INSULIN ASPART 100 UNIT/ML IJ SOLN: 0.0000 [IU] | INTRAMUSCULAR | Status: DC

## 2023-12-03 MED ORDER — TACROLIMUS 1 MG PO CAPS
2.0000 mg | ORAL_CAPSULE | Freq: Every day | ORAL | Status: DC
  Administered 2023-12-03 – 2023-12-04 (×2): 2 mg via ORAL
  Filled 2023-12-03 (×2): qty 2

## 2023-12-03 MED ORDER — OLANZAPINE 5 MG PO TABS
5.0000 mg | ORAL_TABLET | Freq: Every day | ORAL | Status: DC
  Administered 2023-12-03 – 2023-12-05 (×3): 5 mg via ORAL
  Filled 2023-12-03 (×3): qty 1

## 2023-12-03 MED ORDER — INSULIN ASPART 100 UNIT/ML IJ SOLN
0.0000 [IU] | INTRAMUSCULAR | Status: DC
  Administered 2023-12-03: 1 [IU] via SUBCUTANEOUS
  Administered 2023-12-04: 2 [IU] via SUBCUTANEOUS

## 2023-12-03 MED ORDER — INSULIN GLARGINE-YFGN 100 UNIT/ML ~~LOC~~ SOLN
20.0000 [IU] | Freq: Every day | SUBCUTANEOUS | Status: DC
  Filled 2023-12-03: qty 0.2

## 2023-12-03 MED ORDER — ACETAMINOPHEN 650 MG RE SUPP: 650.0000 mg | Freq: Four times a day (QID) | RECTAL | Status: DC | PRN

## 2023-12-03 MED ORDER — INSULIN GLARGINE-YFGN 100 UNIT/ML ~~LOC~~ SOLN
10.0000 [IU] | Freq: Every day | SUBCUTANEOUS | Status: DC
  Administered 2023-12-03 – 2023-12-05 (×3): 10 [IU] via SUBCUTANEOUS
  Filled 2023-12-03 (×3): qty 0.1

## 2023-12-03 MED ORDER — TACROLIMUS 1 MG PO CAPS: 2.0000 mg | ORAL_CAPSULE | ORAL | Status: DC

## 2023-12-03 MED ORDER — DORZOLAMIDE HCL-TIMOLOL MAL 2-0.5 % OP SOLN
1.0000 [drp] | Freq: Two times a day (BID) | OPHTHALMIC | Status: DC
  Administered 2023-12-03 – 2023-12-05 (×5): 1 [drp] via OPHTHALMIC
  Filled 2023-12-03: qty 10

## 2023-12-03 MED ORDER — CINACALCET HCL 30 MG PO TABS
30.0000 mg | ORAL_TABLET | Freq: Every day | ORAL | Status: DC
Start: 2023-12-03 — End: 2023-12-05
  Administered 2023-12-03 – 2023-12-05 (×3): 30 mg via ORAL
  Filled 2023-12-03 (×3): qty 1

## 2023-12-03 MED ORDER — PREDNISONE 5 MG PO TABS
5.0000 mg | ORAL_TABLET | Freq: Every day | ORAL | Status: DC
  Administered 2023-12-03 – 2023-12-05 (×3): 5 mg via ORAL
  Filled 2023-12-03 (×3): qty 1

## 2023-12-03 MED ORDER — PANTOPRAZOLE SODIUM 40 MG IV SOLR
40.0000 mg | Freq: Two times a day (BID) | INTRAVENOUS | Status: DC
  Administered 2023-12-03 – 2023-12-05 (×6): 40 mg via INTRAVENOUS
  Filled 2023-12-03 (×6): qty 10

## 2023-12-03 NOTE — H&P (View-Only) (Signed)
 Referring Provider: Dr. Franky, TRH Primary Care Physician:  [name], PA-C Primary Gastroenterologist:  [facility], seen by Dr. Shila first here  Reason for Consultation:  Black stool, anemia  HPI: Barry Nichols is a 48 y.o. 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  He was seen by our service last month for black stools and anemia.  He underwent EGD as below with esophageal ulcers and grade C esophagitis but no stigmata of bleeding.  Was treated with pantoprazole  twice daily.  No evidence of iron  deficiency from labs earlier this year.  Has history of folate deficiency.  Looks like baseline hemoglobin is between 7 and 8 g.  He was sent back to the hospital again on this occasion after being found to have a low hemoglobin after dialysis.  Hemoglobin 6.6 g today so he received 2 units of packed red blood cells.  Continues to report ongoing black stools.  Apparently also reports blood in his urine.  Reports ongoing issues with hiccups that will then cause him to become short of breath and sometimes vomit.  Of note, the patient was seen by our service here and had EGD on July 24 as above and then was seen again by [facility] on July 31 for ongoing complaints of hiccups.  They continued pantoprazole , added liquid Carafate  4 times a day and gave baclofen .   Reports being compliant with his medications.    EGD November 11, 2023:  - Esophageal ulcers with no bleeding and no stigmata of recent bleeding. Biopsied. - LA Grade C reflux esophagitis with no bleeding. - Gastritis. Biopsied. - Normal examined duodenum.  A. STOMACH, BIOPSY:       Gastric antral / oxyntic mucosa without significant diagnostic  alteration.      No H. pylori identified on HE stain.       Negative for intestinal metaplasia  or dysplasia.   B. ESOPHAGUS, BIOPSY:       Esophageal squamous mucosa without significant diagnostic  alteration.      Separated gastric oxyntic mucosa without significant diagnostic  alteration.      No evidence of intraepithelial lymphocytes or eosinophils.       No H. pylori identified on HE stain.       Negative for intestinal metaplasia or dysplasia.    He recently went for screening colonoscopy at [facility] on 10/19/2023.  2 adenomatous colon polyps were removed.  His bowel prep was not adequate with repeat recommended in 3 years.  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, Bilateral   Migraine    when I was young (08/25/2016   Posterior equinus, acquired    Bilateral   Pre-diabetes     Past Surgical History:  Procedure Laterality Date   AV FISTULA PLACEMENT Left 10/15/2016   Procedure: ARTERIOVENOUS (AV) FISTULA CREATION-LEFT ARM;  Surgeon:  Laurence Redell CROME, MD;  Location: [facility] OR;  Service: Vascular;  Laterality: Left;   DECOMPRESSION HIP-CORE Left 12/07/2022   Procedure: LEFT HIP CORE DECOMPRESSION WITH ILIAC CREST BONE MARROW ASPIRATE;  Surgeon: Genelle Standing, MD;  Location: [facility] OR;  Service: Orthopedics;  Laterality: Left;   ESOPHAGOGASTRODUODENOSCOPY N/A 11/11/2023   Procedure: EGD (ESOPHAGOGASTRODUODENOSCOPY);  Surgeon: Nandigam, Kavitha V, MD;  Location: [facility] ENDOSCOPY;  Service: Gastroenterology;  Laterality: N/A;   INSERTION OF DIALYSIS CATHETER Right 10/15/2016   Procedure: INSERTION OF DIALYSIS CATHETER;  Surgeon: Laurence Redell CROME, MD;  Location: [facility] OR;  Service: Vascular;  Laterality: Right;    KIDNEY TRANSPLANT  06/15/2020   [facility]   WISDOM TOOTH EXTRACTION      Prior to Admission medications   Medication Sig Start Date End Date Taking? Authorizing Provider  cinacalcet  (SENSIPAR ) 30 MG tablet Take 30 mg by mouth daily.   Yes [provider]  Darbepoetin Alfa  (ARANESP ) 200 MCG/0.4ML SOSY injection Inject 0.4 mLs (200 mcg total) into the skin every Wednesday at 6 PM. 11/26/23   Amin, Ankit C, MD  dorzolamide -timolol  (COSOPT ) 2-0.5 % ophthalmic solution Place 1 drop into both eyes 2 (two) times daily. 11/19/23   [provider]  icosapent  Ethyl (VASCEPA ) 1 g capsule Take 2 g by mouth 2 (two) times daily. Patient not taking: Reported on 11/23/2023    [provider]  insulin  degludec (TRESIBA ) 200 UNIT/ML FlexTouch Pen Inject 30 Units into the skin at bedtime. 09/23/21   [provider]  insulin  lispro (HUMALOG KWIKPEN) 200 UNIT/ML KwikPen Inject 25-40 Units into the skin 3 (three) times daily as needed (high blood sugar). 09/02/20   [provider]  metoprolol  succinate (TOPROL -XL) 25 MG 24 hr tablet Take 1 tablet (25 mg total) by mouth every evening. 11/11/23   Ghimire, Donalda HERO, MD  OLANZapine  (ZYPREXA ) 5 MG tablet Take 1 tablet (5 mg total) by mouth daily. 11/12/23   Ghimire, Donalda HERO, MD  pantoprazole  (PROTONIX ) 40 MG tablet Take 1 tablet (40 mg total) by mouth 2 (two) times daily before a meal. Take twice daily x 6 weeks, then switch to once daily. 11/11/23   Ghimire, Donalda HERO, MD  predniSONE  (DELTASONE ) 5 MG tablet Take 5 mg by mouth daily.    [provider]  sucralfate  (CARAFATE ) 1 GM/10ML suspension Take 1 g by mouth every 6 (six) hours. 11/18/23 12/18/23  [provider]  sulfamethoxazole -trimethoprim  (BACTRIM ) 400-80 MG tablet Take 1 tablet by mouth every Monday, Wednesday, and Friday.    [provider]  tacrolimus  (PROGRAF ) 1 MG capsule Take 2-3 mg by mouth See admin instructions. Take 3 capsules  (3mg ) in the morning and then take 2 capsules (2mg ) at bedtime.    [provider]  tadalafil (CIALIS) 20 MG tablet Take 20 mg by mouth daily as needed for erectile dysfunction.    [provider]  traZODone  (DESYREL ) 50 MG tablet Take 50 mg by mouth at bedtime.    [provider]  Vitamin D, Ergocalciferol, (DRISDOL) 1.25 MG (50000 UNIT) CAPS capsule Take 50,000 Units by mouth every 7 (seven) days. 03/02/23   [provider]    Current Facility-Administered Medications  Medication Dose Route Frequency Provider Last Rate Last Admin   acetaminophen  (TYLENOL ) tablet 650 mg  650 mg Oral Q6H PRN Franky Redia SAILOR, MD       Or   acetaminophen  (TYLENOL ) suppository 650 mg  650 mg Rectal Q6H PRN Franky Redia SAILOR, MD  Chlorhexidine  Gluconate Cloth 2 % PADS 6 each  6 each Topical Q0600 Melia Lynwood ORN, MD   6 each at 12/03/23 0545   cinacalcet  (SENSIPAR ) tablet 30 mg  30 mg Oral Daily Franky Redia SAILOR, MD       dorzolamide -timolol  (COSOPT ) 2-0.5 % ophthalmic solution 1 drop  1 drop Both Eyes BID Franky Redia SAILOR, MD       insulin  aspart (novoLOG ) injection 0-6 Units  0-6 Units Subcutaneous Q4H Odell Celinda Balo, MD       insulin  glargine-yfgn (SEMGLEE ) injection 10 Units  10 Units Subcutaneous Daily Odell Celinda Balo, MD       metoprolol  succinate (TOPROL -XL) 24 hr tablet 25 mg  25 mg Oral QPM Franky Redia SAILOR, MD       OLANZapine  (ZYPREXA ) tablet 5 mg  5 mg Oral Daily Franky Redia SAILOR, MD       pantoprazole  (PROTONIX ) injection 40 mg  40 mg Intravenous Q12H Franky Redia SAILOR, MD   40 mg at 12/03/23 0414   predniSONE  (DELTASONE ) tablet 5 mg  5 mg Oral Daily Franky Redia SAILOR, MD       sulfamethoxazole -trimethoprim  (BACTRIM ) 400-80 MG per tablet 1 tablet  1 tablet Oral Q M,W,F Franky Redia SAILOR, MD       tacrolimus  (PROGRAF ) capsule 3 mg  3 mg Oral q morning Laron Lynwood, [facility]       And   tacrolimus  (PROGRAF ) capsule 2 mg  2 mg  Oral QHS Laron Lynwood, RPH        Allergies as of 12/02/2023   (No Known Allergies)    Family History  Problem Relation Age of Onset   Hypertension Father        Also maternal grandmother   Diabetes Father        also maternal grandmother   CVA Father    Cancer Maternal Grandfather        lung   Heart attack Maternal Grandfather     Social History   Socioeconomic History   Marital status: Married    Spouse name: Not on file   Number of children: 1   Years of education: BA   Highest education level: Not on file  Occupational History   Not on file  Tobacco Use   Smoking status: Never   Smokeless tobacco: Never  Vaping Use   Vaping status: Never Used  Substance and Sexual Activity   Alcohol use: No    Comment: rare   Drug use: No    Comment: formerly used Marijuana   Sexual activity: Not on file  Other Topics Concern   Not on file  Social History Narrative   Drinks tea 3-5 times a week    Social Drivers of Health   Financial Resource Strain: Not on file  Food Insecurity: No Food Insecurity (12/03/2023)   Hunger Vital Sign    Worried About Running Out of Food in the Last Year: Never true    Ran Out of Food in the Last Year: Never true  Transportation Needs: No Transportation Needs (12/03/2023)   PRAPARE - Administrator, Civil Service (Medical): No    Lack of Transportation (Non-Medical): No  Physical Activity: Not on file  Stress: Not on file  Social Connections: Not on file  Intimate Partner Violence: Not At Risk (12/03/2023)   Humiliation, Afraid, Rape, and Kick questionnaire    Fear of Current or Ex-Partner: No    Emotionally Abused: No  Physically Abused: No    Sexually Abused: No    Review of Systems: ROS is O/W negative except as mentioned in HPI.  Physical Exam: Vital signs in last 24 hours: Temp:  [97.5 F (36.4 C)-99.3 F (37.4 C)] 97.5 F (36.4 C) (08/15 0830) Pulse Rate:  [68-108] 85 (08/15 0900) Resp:  [11-23] 16 (08/15  0900) BP: (123-189)/(54-113) 181/96 (08/15 0900) SpO2:  [93 %-100 %] 100 % (08/15 0900) Weight:  [97.5 kg-98 kg] 98 kg (08/15 0756)   General:  Alert, Well-developed, well-nourished, pleasant and cooperative in NAD Head:  Normocephalic and atraumatic. Eyes:  Sclera clear, no icterus.  Conjunctiva pink. Ears:  Normal auditory acuity. Mouth:  No deformity or lesions.   Lungs:  Clear throughout to auscultation.  No wheezes, crackles, or rhonchi.  Heart:  Regular rate and rhythm; no murmurs, clicks, rubs, or gallops. Abdomen:  Soft, non-distended.  BS present.  Non-tender.  Msk:  Symmetrical without gross deformities.  Pulses:  Normal pulses noted. Extremities:  Without clubbing or edema. Neurologic:  Alert and oriented x 4;  grossly normal neurologically. Skin:  Intact without significant lesions or rashes. Psych:  Alert and cooperative. Normal mood and affect.  Intake/Output from previous day: 08/14 0701 - 08/15 0700 In: 432 [Blood:432] Out: 50 [Urine:50] Intake/Output this shift: Total I/O In: 372 [Blood:372] Out: -   Lab Results: Recent Labs    12/01/23 1344 12/02/23 1428 12/02/23 2020  WBC 7.0 7.2  --   HGB 7.2* 6.6* 6.5*  HCT 23.3* 21.5* 19.0*  PLT 420* 408*  --    BMET Recent Labs    12/01/23 1344 12/02/23 1428 12/02/23 2020  NA 136 136 133*  K 4.1 4.3 4.6  CL 92* 91*  --   CO2 24 23  --   GLUCOSE 238* 174*  --   BUN 26* 52*  --   CREATININE 6.75* 10.44*  --   CALCIUM 8.8* 8.9  --    LFT Recent Labs    12/02/23 1428  PROT 7.0  ALBUMIN 1.8*  AST 52*  ALT 61*  ALKPHOS 84  BILITOT 0.7   PT/INR Recent Labs    12/01/23 1344  LABPROT 17.9*  INR 1.4*   Hepatitis Panel Recent Labs    12/02/23 2339  HEPBSAG NON REACTIVE   Studies/Results: MR BRAIN WO CONTRAST Result Date: 12/03/2023 CLINICAL DATA:  48 year old male dialysis patient. Anemia requiring transfusion. Suspected GI bleeding. Altered mental status. EXAM: MRI HEAD WITHOUT CONTRAST  TECHNIQUE: Multiplanar, multiecho pulse sequences of the brain and surrounding structures were obtained without intravenous contrast. COMPARISON:  None Available. FINDINGS: Brain: No restricted diffusion to suggest acute infarction. No midline shift, mass effect, evidence of mass lesion, ventriculomegaly, extra-axial collection or acute intracranial hemorrhage. Cervicomedullary junction and pituitary are within normal limits. Largely normal for age gray and white matter signal. There are scattered small subcortical white matter T2 and FLAIR hyperintense foci (such as on series 11, image 36) which may be mildly advanced for age but are in a nonspecific configuration. No cortical encephalomalacia. No chronic cerebral blood products on SWI. Deep gray nuclei, brainstem, and cerebellum appear negative. Vascular: Major intracranial vascular flow voids are preserved, distal right vertebral artery appears to be dominant, normal variant. Skull and upper cervical spine: Abnormally decreased background T1 marrow signal in the skull base and visible spine. This could be red marrow reactivation or sequelae of chronic kidney disease. Otherwise negative visible cervical spine. Sinuses/Orbits: Negative orbits. Small left maxillary sinus retention  cyst. Other: Mastoids are clear. Grossly normal visible internal auditory structures. Negative visible scalp and face. IMPRESSION: 1. No acute intracranial abnormality. 2. Decreased T1 bone marrow signal which is probably related to red marrow reactivation and/or chronic renal disease in this setting. Electronically Signed   By: VEAR Hurst M.D.   On: 12/03/2023 04:27   IMPRESSION:  48 y.o. 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  Ongoing black stools with intermittent drops in hemoglobin  requiring transfusion.  Concern for ongoing GI bleeding.  Hemoglobin 6.6 g has been transfused with 2 units of packed red blood cells.  ? If this could be due to the esophageal ulcers and grade C esophagitis seen on his EGD just last month.  No evidence of iron  deficiency on labs from earlier this year.  It looks like his baseline hemoglobin is between 7 and 8 g.  Likely a component of anemia of chronic disease related to his chronic kidney disease.  Hematuria: Also reports seeing blood in his urine, which could also be contributing to his blood loss.  Supposed to see urology.  Mildly elevated liver enzymes with history of hepatic steatosis  History of folate deficiency from previous labs.  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 - Monitor hemoglobin and transfuse further if needed. -Will plan for enteroscopy on 8/16 to reassess esophagus/ulcers and evaluate for any other source of bleeding. -Urology consult or follow-up as well.  **Of note, the patient was seen by our service here and had EGD on July 24 and then was seen again by [facility] on July 31 for ongoing complaints of hiccups.  They continued pantoprazole , added liquid Carafate  4 times a day and gave baclofen .  Should continue to follow-up with [facility] upon discharge.   Harlene BIRCH. Zehr  12/03/2023, 9:17 AM    Attending physician's note   I have taken history, reviewed the chart and examined the patient. I performed a substantive portion of this encounter, including complete performance of at least one of the key components, in conjunction with the APP. I agree with the Advanced Practitioner's note, impression and recommendations.   Recurrent melena. Hb 6.6 (adm) s/p 2U. EGD July 24 with eso ulcers, LA Gd C esophagitis. Note that his baseline hemoglobin ranges from 7-8.  ESRD on HD after failed renal transplant (rejection) on  immunosuppressive meds  Colonic polyps on recent colonoscopy at [facility] 10/19/2023. Rpt 3 yrs d/t quality of prep.  MASLD with mildly elevated AST/ALT. No cirrhosis.  Associated significant hematuria  Plan: - Protonix  40 BID - Push enteroscopy tomorrow-to ensure healing of eso ulcers/eval for Px SB lesions. If neg and he still has problems, will consider VCE. - Trend CBC. Keep Hb>7 - Recommend urology consultation for hematuria.  I have discussed risks and benefits.  All questions were answered.  Anselm Bring, MD [facility] [phone]

## 2023-12-03 NOTE — Procedures (Signed)
 I was present at this dialysis session. I have reviewed the session itself and made appropriate changes.   Vital signs in last 24 hours:  Temp:  [97.5 F (36.4 C)-99.3 F (37.4 C)] 97.5 F (36.4 C) (08/15 0830) Pulse Rate:  [68-108] 68 (08/15 0830) Resp:  [11-23] 11 (08/15 0830) BP: (123-189)/(54-113) 189/108 (08/15 0830) SpO2:  [93 %-100 %] 100 % (08/15 0830) Weight:  [97.5 kg-98 kg] 98 kg (08/15 0756) Weight change:  Filed Weights   12/02/23 1909 12/03/23 0756  Weight: 97.5 kg 98 kg    Recent Labs  Lab 12/02/23 1428 12/02/23 2020  NA 136 133*  K 4.3 4.6  CL 91*  --   CO2 23  --   GLUCOSE 174*  --   BUN 52*  --   CREATININE 10.44*  --   CALCIUM 8.9  --     Recent Labs  Lab 12/01/23 1344 12/02/23 1428 12/02/23 2020  WBC 7.0 7.2  --   HGB 7.2* 6.6* 6.5*  HCT 23.3* 21.5* 19.0*  MCV 86.9 86.0  --   PLT 420* 408*  --     Scheduled Meds:  Chlorhexidine  Gluconate Cloth  6 each Topical Q0600   cinacalcet   30 mg Oral Daily   dorzolamide -timolol   1 drop Both Eyes BID   insulin  aspart  0-6 Units Subcutaneous Q4H   insulin  glargine-yfgn  10 Units Subcutaneous Daily   metoprolol  succinate  25 mg Oral QPM   OLANZapine   5 mg Oral Daily   pantoprazole  (PROTONIX ) IV  40 mg Intravenous Q12H   predniSONE   5 mg Oral Daily   sulfamethoxazole -trimethoprim   1 tablet Oral Q M,W,F   tacrolimus   3 mg Oral q morning   And   tacrolimus   2 mg Oral QHS   Continuous Infusions: PRN Meds:.acetaminophen  **OR** acetaminophen    Fairy DELENA Sellar,  MD 12/03/2023, 8:47 AM

## 2023-12-03 NOTE — Progress Notes (Signed)
 Pt receives out-pt HD at [facility] on MWF 5:45 am chair time. Will assist as needed.   Randine Mungo Dialysis Navigator [phone]

## 2023-12-03 NOTE — Progress Notes (Signed)
   12/03/23 1159  Vitals  Temp 98.7 F (37.1 C)  Pulse Rate 88  Resp 14  BP (!) 143/114  SpO2 100 %  O2 Device Room Air  Weight 99.8 kg  Type of Weight Post-Dialysis  Oxygen Therapy  Patient Activity (if Appropriate) In bed  Pulse Oximetry Type Continuous  Post Treatment  Dialyzer Clearance Lightly streaked  Hemodialysis Intake (mL) 0 mL  Liters Processed 82.5  Fluid Removed (mL) 2000 mL  Tolerated HD Treatment Yes  AVG/AVF Arterial Site Held (minutes) 8 minutes  AVG/AVF Venous Site Held (minutes) 8 minutes   Received patient in bed to unit.  Alert and oriented.  Informed consent signed and in chart.   TX duration:3.5HRS  Patient tolerated well.  Transported back to the room  Alert, without acute distress.  Hand-off given to patient's nurse.   Access used: LAVF Access issues: NONE  Total UF removed: 2L Medication(s) given: NONE   [facility]

## 2023-12-03 NOTE — Consult Note (Signed)
 [facility] Renal Consultation Note    Indication for Consultation:  Management of ESRD/hemodialysis, anemia, hypertension/volume, and secondary hyperparathyroidism. PCP:  HPI: Barry Nichols is a 48 y.o. male with PMH of ESRD on HD (TTS), failed kidney transplant, DM2, chronic anemia, HTN, chronic pancreatitis, and chronic blood loss requiring transfusions. Patient was admitted on November 10, 2023 for GI bleed when patient underwent EGD showed esophageal ulcers with no stigmata for bleeding. Patient subsequently was admitted again on November 23, 2023 with anemia at that time patient also had hematuria. Urologist recommended following up with patient's urologist with whom patient has appointment next month at [facility] if hemoglobin rises appropriately after transfusion.  In the ED his labs showed Hgb 6.6. Nephrology consulted and we recommended transfusing PRBC and doing HD. Since patient was lethargic venous blood gas was done which shows pH of 7.47 pCO2 of 36 MRI brain was negative. Patient admitted for symptomatic anemia.  Patient seen and examined at HD. He appears to be very lethargic and altered from his baseline. Normally he is very talkative, but this morning was different. He denied dyspnea and CP this morning but was not interactive with me. Plan for 3L. Originally planned for 2L but he requested 3L due to him drinking a lot. BP high but this was before UF.   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 He has never used smokeless tobacco. He reports that he does not drink alcohol and does not use drugs.   Physical Exam: Vitals:   12/03/23 0521 12/03/23 0539 12/03/23 0756 12/03/23 0813  BP: (!) 156/98 (!) 152/90 (!) 174/101 (!) 169/93  Pulse: 83 88 86 80  Resp:  18 (!) 23 16  Temp: 98.6 F (37 C) 98.6 F (37 C) (!) 97.5 F (36.4 C)   TempSrc: Oral Oral    SpO2: 98% 99%  93%  Weight:   98 kg   Height:         General: Well developed, well nourished, in no acute distress. Head: Normocephalic, atraumatic, sclera non-icteric, mucus membranes are moist. Neck: Supple without lymphadenopathy/masses. JVD not elevated. Lungs: Clear bilaterally to auscultation without wheezes, rales, or rhonchi. Breathing is unlabored. No IWOB Heart: RRR with normal S1, S2. No murmurs, rubs, or gallops appreciated. Abdomen: Soft, tender, non-distended with normoactive bowel sounds. Musculoskeletal:  Strength and tone appear normal for age. Lower extremities: Sockline edema, no ischemic changes,  no open wounds. Neuro: Alert and oriented X 3. Moves all extremities spontaneously. Psych:  Responds to questions appropriately with a normal affect. Dialysis Access: L aVF +bruit  No Known Allergies Prior to Admission medications   Medication Sig Start Date End Date Taking? Authorizing Provider  cinacalcet  (SENSIPAR ) 30 MG tablet Take 30 mg by mouth daily.   Yes [provider]  Darbepoetin Alfa  (ARANESP ) 200 MCG/0.4ML SOSY injection Inject 0.4 mLs (200 mcg total) into the skin every Wednesday at 6 PM. 11/26/23   Amin, Ankit C, MD  dorzolamide -timolol  (COSOPT ) 2-0.5 %  ophthalmic solution Place 1 drop into both eyes 2 (two) times daily. 11/19/23   [provider]  icosapent  Ethyl (VASCEPA ) 1 g capsule Take 2 g by mouth 2 (two) times daily. Patient not taking: Reported on 11/23/2023    [provider]  insulin  degludec (TRESIBA ) 200 UNIT/ML FlexTouch Pen Inject 30 Units into the skin at bedtime. 09/23/21   [provider]  insulin  lispro (HUMALOG KWIKPEN) 200 UNIT/ML KwikPen Inject 25-40 Units into the skin 3 (three) times daily as needed (high blood sugar). 09/02/20   [provider]  metoprolol  succinate (TOPROL -XL) 25 MG 24 hr tablet Take 1 tablet (25 mg total) by mouth every evening. 11/11/23   Ghimire, Donalda HERO, MD  OLANZapine  (ZYPREXA ) 5 MG tablet Take 1 tablet (5 mg total) by mouth daily. 11/12/23   Ghimire, Donalda HERO, MD  pantoprazole  (PROTONIX ) 40 MG tablet Take 1 tablet (40 mg total) by mouth 2 (two) times daily before a meal. Take twice daily x 6 weeks, then switch to once daily. 11/11/23   Ghimire, Donalda HERO, MD  predniSONE  (DELTASONE ) 5 MG tablet Take 5 mg by mouth daily.    [provider]  sucralfate  (CARAFATE ) 1 GM/10ML suspension Take 1 g by mouth every 6 (six) hours. 11/18/23 12/18/23  [provider]  sulfamethoxazole -trimethoprim  (BACTRIM ) 400-80 MG tablet Take 1 tablet by mouth every Monday, Wednesday, and Friday.    [provider]  tacrolimus  (PROGRAF ) 1 MG capsule Take 2-3 mg by mouth See admin instructions. Take 3 capsules (3mg ) in the morning and then take 2 capsules (2mg ) at bedtime.    [provider]  tadalafil (CIALIS) 20 MG tablet Take 20 mg by mouth daily as needed for erectile dysfunction.    [provider]  traZODone  (DESYREL ) 50 MG tablet Take 50 mg by mouth at bedtime.    [provider]  Vitamin D, Ergocalciferol, (DRISDOL) 1.25 MG (50000 UNIT) CAPS capsule Take 50,000 Units by mouth every 7 (seven) days. 03/02/23   [provider]   Current  Facility-Administered Medications  Medication Dose Route Frequency Provider Last Rate Last Admin   acetaminophen  (TYLENOL ) tablet 650 mg  650 mg Oral Q6H PRN Franky Redia SAILOR, MD       Or   acetaminophen  (TYLENOL ) suppository 650 mg  650 mg Rectal Q6H PRN Franky Redia SAILOR, MD       Chlorhexidine  Gluconate Cloth 2 % PADS 6 each  6 each Topical Q0600 Melia Lynwood ORN, MD   6 each at 12/03/23 0545   cinacalcet  (SENSIPAR ) tablet 30 mg  30 mg Oral Daily Franky Redia SAILOR, MD       dorzolamide -timolol  (COSOPT ) 2-0.5 % ophthalmic solution 1 drop  1 drop Both Eyes BID Franky Redia SAILOR, MD       insulin  aspart (novoLOG ) injection 0-6 Units  0-6 Units Subcutaneous Q4H Odell Celinda Balo, MD  insulin  glargine-yfgn (SEMGLEE ) injection 10 Units  10 Units Subcutaneous Daily Odell Celinda Balo, MD       metoprolol  succinate (TOPROL -XL) 24 hr tablet 25 mg  25 mg Oral QPM Franky Redia SAILOR, MD       OLANZapine  (ZYPREXA ) tablet 5 mg  5 mg Oral Daily Franky Redia SAILOR, MD       pantoprazole  (PROTONIX ) injection 40 mg  40 mg Intravenous Q12H Franky Redia SAILOR, MD   40 mg at 12/03/23 0414   predniSONE  (DELTASONE ) tablet 5 mg  5 mg Oral Daily Franky Redia SAILOR, MD       sulfamethoxazole -trimethoprim  (BACTRIM ) 400-80 MG per tablet 1 tablet  1 tablet Oral Q M,W,F Franky Redia SAILOR, MD       tacrolimus  (PROGRAF ) capsule 3 mg  3 mg Oral q morning Laron Agent, [facility]       And   tacrolimus  (PROGRAF ) capsule 2 mg  2 mg Oral QHS Laron Agent, [facility]       Labs: Basic Metabolic Panel: Recent Labs  Lab 12/01/23 1344 12/02/23 1428 12/02/23 2020  NA 136 136 133*  K 4.1 4.3 4.6  CL 92* 91*  --   CO2 24 23  --   GLUCOSE 238* 174*  --   BUN 26* 52*  --   CREATININE 6.75* 10.44*  --   CALCIUM 8.8* 8.9  --    Liver Function Tests: Recent Labs  Lab 12/01/23 1344 12/02/23 1428  AST 53* 52*  ALT 62* 61*  ALKPHOS 91 84  BILITOT 0.7 0.7  PROT 7.5 7.0  ALBUMIN 1.8* 1.8*   No  results for input(s): LIPASE, AMYLASE in the last 168 hours. No results for input(s): AMMONIA in the last 168 hours. CBC: Recent Labs  Lab 12/01/23 1344 12/02/23 1428 12/02/23 2020  WBC 7.0 7.2  --   HGB 7.2* 6.6* 6.5*  HCT 23.3* 21.5* 19.0*  MCV 86.9 86.0  --   PLT 420* 408*  --    Cardiac Enzymes: No results for input(s): CKTOTAL, CKMB, CKMBINDEX, TROPONINI in the last 168 hours. CBG: Recent Labs  Lab 12/03/23 0015 12/03/23 0416  GLUCAP 129* 105*    Relevant home meds: Toprol  xl 25 every day Prograf   3 mg am + 2mg  pm Prednisone  5mg  qd    OP HD: [facility]  4h  B400  96.8kg   2K bath  AVF  Heparin  2000 Last HD 8/04, post wt 97.3kg WG 1.5- 4.5kg, getting to dry wt the last 2 wks Mircera 225 q 2 wks, last 7/25, due 8/8 Hectorol 6 mcg IV mwf  Last 3 Hb: 7/21= 8.2, 7/28= 7.6, 8/04 = 6.2, 8/14 6.5   Assessment/ Plan: Symptomatic acute on chronic anemia: Hb 6.5 in ED, rec'd 1u prbc's overnight with a second to be transfused. Per pmd.  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 Follow.  ESRD: on HD MWF. Plan HD today/ this evening.  HTN: BP's stable, cont toprol  xl every day.   Volume: looks euvolemic on exam, on chronic [place] O2. UF 2-3 L as tol w/ next HD.  Gross hematuria: per pmd Failed renal transplant: remains on pred / prograf , continue  Belvie Och, NP 12/03/2023, 8:15 AM  [facility]

## 2023-12-03 NOTE — Plan of Care (Signed)
  Problem: Health Behavior/Discharge Planning: Goal: Ability to manage health-related needs will improve Outcome: Progressing   Problem: Activity: Goal: Risk for activity intolerance will decrease Outcome: Progressing   Problem: Nutrition: Goal: Adequate nutrition will be maintained Outcome: Progressing   Problem: Coping: Goal: Level of anxiety will decrease Outcome: Progressing   Problem: Pain Managment: Goal: General experience of comfort will improve and/or be controlled Outcome: Progressing   Problem: Safety: Goal: Ability to remain free from injury will improve Outcome: Progressing

## 2023-12-03 NOTE — Progress Notes (Signed)
 [facility] PROGRESS NOTE    Progress Note  Barry Nichols  FMW:[id number] DOB: 09/12/1975 DOA: 12/02/2023 PCP: [name], PA-C     Brief Narrative:   Barry Nichols is an 48 y.o. male past medical history of end-stage renal disease on hemodialysis Tuesday Thursdays and Saturdays, failed renal transplant, diabetes mellitus type 2, chronic anemia recently discharged for blood loss anemia in July 2024, for which she had an EGD that showed esophageal ulcer, admitted again in July with hematuria for which urology recommended to follow-up with them as an outpatient in 1 month.  Went to dialysis and was found to have a low hemoglobin and was sent to the ED.  Patient relates blood in stools.  In the ER was found to have a hemoglobin of 6, MRI of the brain was negative as he was lethargic.  Assessment/Plan:   Acute blood loss anemia/melanotic stools/ Symptomatic anemia Last hemoglobin on 11/25/2023 was 7.8, here on admission 6.5. He is complaining of melanotic stools.  Had an EGD in July 2025 that showed an esophageal ulcer. GI was consulted as he was complaining of black tarry stools, cont. IV Protonix . Check CBC posttransfusion. Keep the patient NPO, in case GI wants to perform procedure.  Acute metabolic encephalopathy/lethargy: He answers questions appropriately and moving all 4 extremities, MRI of the brain showed no acute findings. He relates he has not slept well in 2 days.   Ammonia level and TSH are pending.  Gross hematuria: For which she will follow-up with urology as an outpatient. During that time anticoagulation was held and he was transfused 2 units of packed red blood cells. Case was discussed with urology and recommended to follow-up with them as an outpatient.  ESRD on dialysis [facility]) renal transplant: Usually dialyzes Tuesday Thursdays and Saturdays. Renal to dialyze today will transfuse during dialysis. Continue tacrolimus  and prednisone  per  nephrology.  Diabetes mellitus type 2: With last hemoglobin A1c of 9.3. Keep him n.p.o. decrease his long-acting insulin  continue CBGs Q4 and sliding scale.  BPH:  Continue Flomax.  Essential hypertension/sinus tachycardia: Continue Toprol .   DVT prophylaxis: SCD Family Communication:none Status is: Observation The patient will require care spanning > 2 midnights and should be moved to inpatient because: Hematuria and acute GI bleed    Code Status:     Code Status Orders  (From admission, onward)           Start     Ordered   12/03/23 0048  Full code  Continuous       Question:  By:  Answer:  Consent: discussion documented in EHR   12/03/23 0049           Code Status History     Date Active Date Inactive Code Status Order ID Comments User Context   11/23/2023 2214 11/25/2023 1657 Full Code [id number]  Lee Kingfisher, MD ED   11/10/2023 1121 11/11/2023 2236 Full Code [id number]  Barry Isaiah CROME, NP Inpatient   05/13/2023 0247 05/18/2023 1817 Full Code [id number]  Barry Redia SAILOR, MD Inpatient   08/25/2016 1419 08/27/2016 2004 DNR [id number]  Barry Delon DASEN, MD ED   08/25/2016 1402 08/25/2016 1419 Full Code [id number]  Barry Delon DASEN, MD ED         IV Access:   Peripheral IV   Procedures and diagnostic studies:   MR BRAIN WO CONTRAST Result Date: 12/03/2023 CLINICAL DATA:  48 year old male dialysis patient. Anemia requiring transfusion. Suspected GI bleeding. Altered mental status. EXAM:  MRI HEAD WITHOUT CONTRAST TECHNIQUE: Multiplanar, multiecho pulse sequences of the brain and surrounding structures were obtained without intravenous contrast. COMPARISON:  None Available. FINDINGS: Brain: No restricted diffusion to suggest acute infarction. No midline shift, mass effect, evidence of mass lesion, ventriculomegaly, extra-axial collection or acute intracranial hemorrhage. Cervicomedullary junction and pituitary are within normal limits. Largely normal for age gray  and white matter signal. There are scattered small subcortical white matter T2 and FLAIR hyperintense foci (such as on series 11, image 36) which may be mildly advanced for age but are in a nonspecific configuration. No cortical encephalomalacia. No chronic cerebral blood products on SWI. Deep gray nuclei, brainstem, and cerebellum appear negative. Vascular: Major intracranial vascular flow voids are preserved, distal right vertebral artery appears to be dominant, normal variant. Skull and upper cervical spine: Abnormally decreased background T1 marrow signal in the skull base and visible spine. This could be red marrow reactivation or sequelae of chronic kidney disease. Otherwise negative visible cervical spine. Sinuses/Orbits: Negative orbits. Small left maxillary sinus retention cyst. Other: Mastoids are clear. Grossly normal visible internal auditory structures. Negative visible scalp and face. IMPRESSION: 1. No acute intracranial abnormality. 2. Decreased T1 bone marrow signal which is probably related to red marrow reactivation and/or chronic renal disease in this setting. Electronically Signed   By: VEAR Hurst M.D.   On: 12/03/2023 04:27     Medical Consultants:   None.   Subjective:    Barry Nichols complaining of left leg pain .  Objective:    Vitals:   12/03/23 0039 12/03/23 0329 12/03/23 0521 12/03/23 0539  BP: (!) 176/88 (!) 171/98 (!) 156/98 (!) 152/90  Pulse: 88 90 83 88  Resp: 18 20  18   Temp: 98.4 F (36.9 C) 98.4 F (36.9 C) 98.6 F (37 C) 98.6 F (37 C)  TempSrc:  Oral Oral Oral  SpO2:  98% 98% 99%  Weight:      Height:       SpO2: 99 %   Intake/Output Summary (Last 24 hours) at 12/03/2023 0800 Last data filed at 12/03/2023 0400 Gross per 24 hour  Intake 432 ml  Output 50 ml  Net 382 ml   Filed Weights   12/02/23 1909  Weight: 97.5 kg    Exam: General exam: In no acute distress. Respiratory system: Good air movement and clear to  auscultation. Cardiovascular system: S1 & S2 heard, RRR. No JVD.  Gastrointestinal system: Abdomen is nondistended, soft and nontender.  Extremities: No pedal edema. Skin: No rashes, lesions or ulcers Psychiatry: Judgement and insight appear normal. Mood & affect appropriate.    Data Reviewed:    Labs: Basic Metabolic Panel: Recent Labs  Lab 12/01/23 1344 12/02/23 1428 12/02/23 2020  NA 136 136 133*  K 4.1 4.3 4.6  CL 92* 91*  --   CO2 24 23  --   GLUCOSE 238* 174*  --   BUN 26* 52*  --   CREATININE 6.75* 10.44*  --   CALCIUM 8.8* 8.9  --    GFR Estimated Creatinine Clearance: 10.2 mL/min (A) (by C-G formula based on SCr of 10.44 mg/dL (H)). Liver Function Tests: Recent Labs  Lab 12/01/23 1344 12/02/23 1428  AST 53* 52*  ALT 62* 61*  ALKPHOS 91 84  BILITOT 0.7 0.7  PROT 7.5 7.0  ALBUMIN 1.8* 1.8*   No results for input(s): LIPASE, AMYLASE in the last 168 hours. No results for input(s): AMMONIA in the last 168 hours. Coagulation profile  Recent Labs  Lab 12/01/23 1344  INR 1.4*   COVID-19 Labs  No results for input(s): DDIMER, FERRITIN, LDH, CRP in the last 72 hours.  Lab Results  Component Value Date   SARSCOV2NAA NEGATIVE 11/04/2023   SARSCOV2NAA NEGATIVE 05/12/2023    CBC: Recent Labs  Lab 12/01/23 1344 12/02/23 1428 12/02/23 2020  WBC 7.0 7.2  --   HGB 7.2* 6.6* 6.5*  HCT 23.3* 21.5* 19.0*  MCV 86.9 86.0  --   PLT 420* 408*  --    Cardiac Enzymes: No results for input(s): CKTOTAL, CKMB, CKMBINDEX, TROPONINI in the last 168 hours. BNP (last 3 results) No results for input(s): PROBNP in the last 8760 hours. CBG: Recent Labs  Lab 12/03/23 0015 12/03/23 0416  GLUCAP 129* 105*   D-Dimer: No results for input(s): DDIMER in the last 72 hours. Hgb A1c: No results for input(s): HGBA1C in the last 72 hours. Lipid Profile: No results for input(s): CHOL, HDL, LDLCALC, TRIG, CHOLHDL, LDLDIRECT in the  last 72 hours. Thyroid function studies: No results for input(s): TSH, T4TOTAL, T3FREE, THYROIDAB in the last 72 hours.  Invalid input(s): FREET3 Anemia work up: No results for input(s): VITAMINB12, FOLATE, FERRITIN, TIBC, IRON , RETICCTPCT in the last 72 hours. Sepsis Labs: Recent Labs  Lab 12/01/23 1344 12/02/23 1428  WBC 7.0 7.2   Microbiology Recent Results (from the past 240 hours)  Urine Culture     Status: None   Collection Time: 11/23/23  7:49 PM   Specimen: Urine, Clean Catch  Result Value Ref Range Status   Specimen Description URINE, CLEAN CATCH  Final   Special Requests NONE  Final   Culture   Final    NO GROWTH Performed at [facility], [address] [place], [place] [address]    Report Status 11/25/2023 FINAL  Final     Medications:    Chlorhexidine  Gluconate Cloth  6 each Topical Q0600   cinacalcet   30 mg Oral Daily   dorzolamide -timolol   1 drop Both Eyes BID   insulin  aspart  0-6 Units Subcutaneous Q4H   insulin  glargine-yfgn  20 Units Subcutaneous QHS   metoprolol  succinate  25 mg Oral QPM   OLANZapine   5 mg Oral Daily   pantoprazole  (PROTONIX ) IV  40 mg Intravenous Q12H   predniSONE   5 mg Oral Daily   sulfamethoxazole -trimethoprim   1 tablet Oral Q M,W,F   tacrolimus   3 mg Oral q morning   And   tacrolimus   2 mg Oral QHS   Continuous Infusions:    LOS: 0 days   Erle Odell Castor  [facility]  12/03/2023, 8:00 AM

## 2023-12-03 NOTE — H&P (Addendum)
 History and Physical    Barry Nichols FMW:[id number] DOB: 08-Oct-1975 DOA: 12/02/2023  Patient coming from: Home.  Chief Complaint: Low hemoglobin.  HPI: Barry Nichols is a 48 y.o. male with history of ESRD on hemodialysis on Tuesday Thursday Saturday, failed renal transplant, diabetes mellitus type 2, chronic anemia, hypertension who was recently admitted for blood loss anemia received blood transfusion was found to have low hemoglobin after dialysis and was instructed to come to ER.  Patient states he has noticed some blood in his stools and also urine.  Denies taking any blood thinners.  Patient was admitted on November 10, 2023 for GI bleed when patient underwent EGD showed esophageal ulcers with no stigmata for bleeding.  Patient subsequently was admitted again on November 23, 2023 with anemia at that time patient also had hematuria.  Urologist recommended following up with patient's urologist with whom patient has appointment next month at [facility] if hemoglobin rises appropriately after transfusion.  ED Course: In the ER labs show hemoglobin of 6.6.  ER physician discussed with on-call nephrologist who advised transfusing PRBC and they will see patient in consult may need dialysis.  Since patient was lethargic venous blood gas was done which shows pH of 7.47 pCO2 of 36 MRI brain was negative.  Patient admitted for symptomatic anemia.  Review of Systems: As per HPI, rest all negative.   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, Bilateral   Migraine    when I was young (08/25/2016   Posterior equinus,  acquired    Bilateral   Pre-diabetes     Past Surgical History:  Procedure Laterality Date   AV FISTULA PLACEMENT Left 10/15/2016   Procedure: ARTERIOVENOUS (AV) FISTULA CREATION-LEFT ARM;  Surgeon: Laurence Redell CROME, MD;  Location: [facility] OR;  Service: Vascular;  Laterality: Left;   DECOMPRESSION HIP-CORE Left 12/07/2022   Procedure: LEFT HIP CORE DECOMPRESSION WITH ILIAC CREST BONE MARROW ASPIRATE;  Surgeon: Genelle Standing, MD;  Location: [facility] OR;  Service: Orthopedics;  Laterality: Left;   ESOPHAGOGASTRODUODENOSCOPY N/A 11/11/2023   Procedure: EGD (ESOPHAGOGASTRODUODENOSCOPY);  Surgeon: Nandigam, Kavitha V, MD;  Location: [facility] ENDOSCOPY;  Service: Gastroenterology;  Laterality: N/A;   INSERTION OF DIALYSIS CATHETER Right 10/15/2016   Procedure: INSERTION OF DIALYSIS CATHETER;  Surgeon: Laurence Redell CROME, MD;  Location: [facility] OR;  Service: Vascular;  Laterality: Right;   KIDNEY TRANSPLANT  06/15/2020   [facility] TOOTH EXTRACTION       reports that he has never smoked. He has never used smokeless tobacco. He reports that he does not drink alcohol and does not use drugs.  No Known Allergies  Family History  Problem Relation Age of Onset   Hypertension Father        Also maternal grandmother   Diabetes Father        also maternal grandmother   CVA Father    Cancer Maternal Grandfather        lung   Heart attack Maternal Grandfather  Prior to Admission medications   Medication Sig Start Date End Date Taking? Authorizing Provider  cinacalcet  (SENSIPAR ) 30 MG tablet Take 30 mg by mouth daily.   Yes [provider]  Darbepoetin Alfa  (ARANESP ) 200 MCG/0.4ML SOSY injection Inject 0.4 mLs (200 mcg total) into the skin every Wednesday at 6 PM. 11/26/23   Amin, Ankit C, MD  dorzolamide -timolol  (COSOPT ) 2-0.5 % ophthalmic solution Place 1 drop into both eyes 2 (two) times daily. 11/19/23   [provider]  icosapent  Ethyl (VASCEPA ) 1 g capsule Take 2 g by mouth 2  (two) times daily. Patient not taking: Reported on 11/23/2023    [provider]  insulin  degludec (TRESIBA ) 200 UNIT/ML FlexTouch Pen Inject 30 Units into the skin at bedtime. 09/23/21   [provider]  insulin  lispro (HUMALOG KWIKPEN) 200 UNIT/ML KwikPen Inject 25-40 Units into the skin 3 (three) times daily as needed (high blood sugar). 09/02/20   [provider]  metoprolol  succinate (TOPROL -XL) 25 MG 24 hr tablet Take 1 tablet (25 mg total) by mouth every evening. 11/11/23   Ghimire, Donalda HERO, MD  OLANZapine  (ZYPREXA ) 5 MG tablet Take 1 tablet (5 mg total) by mouth daily. 11/12/23   Ghimire, Donalda HERO, MD  pantoprazole  (PROTONIX ) 40 MG tablet Take 1 tablet (40 mg total) by mouth 2 (two) times daily before a meal. Take twice daily x 6 weeks, then switch to once daily. 11/11/23   Ghimire, Donalda HERO, MD  predniSONE  (DELTASONE ) 5 MG tablet Take 5 mg by mouth daily.    [provider]  sucralfate  (CARAFATE ) 1 GM/10ML suspension Take 1 g by mouth every 6 (six) hours. 11/18/23 12/18/23  [provider]  sulfamethoxazole -trimethoprim  (BACTRIM ) 400-80 MG tablet Take 1 tablet by mouth every Monday, Wednesday, and Friday.    [provider]  tacrolimus  (PROGRAF ) 1 MG capsule Take 2-3 mg by mouth See admin instructions. Take 3 capsules (3mg ) in the morning and then take 2 capsules (2mg ) at bedtime.    [provider]  tadalafil (CIALIS) 20 MG tablet Take 20 mg by mouth daily as needed for erectile dysfunction.    [provider]  traZODone  (DESYREL ) 50 MG tablet Take 50 mg by mouth at bedtime.    [provider]  Vitamin D, Ergocalciferol, (DRISDOL) 1.25 MG (50000 UNIT) CAPS capsule Take 50,000 Units by mouth every 7 (seven) days. 03/02/23   [provider]    Physical Exam: Constitutional: Moderately built and nourished. Vitals:   12/02/23 2239 12/02/23 2323 12/03/23 0019 12/03/23 0035  BP:  (!) 123/113 (!) 156/81 (!)  176/88  Pulse:  100 88 88  Resp:  20 17 18   Temp: 98.9 F (37.2 C) 99.3 F (37.4 C) 99.2 F (37.3 C) 98.4 F (36.9 C)  TempSrc: Oral Oral Oral Oral  SpO2:  100% 99% 99%  Weight:      Height:       Eyes: Anicteric no pallor. ENMT: No discharge from the ears eyes nose or mouth. Neck: No mass felt.  No neck rigidity. Respiratory: No rhonchi or crepitations. Cardiovascular: S1-S2 heard. Abdomen: Soft nontender bowel sound present. Musculoskeletal: No edema.  Has significant pain on palpation. Skin: No rash. Neurologic: Patient is mildly lethargic but answers questions appropriately moving all extremities. Psychiatric: Lethargic.   Labs on Admission: I have personally reviewed following labs and imaging studies  CBC: Recent Labs  Lab 12/01/23 1344 12/02/23 1428 12/02/23 2020  WBC 7.0 7.2  --   HGB  7.2* 6.6* 6.5*  HCT 23.3* 21.5* 19.0*  MCV 86.9 86.0  --   PLT 420* 408*  --    Basic Metabolic Panel: Recent Labs  Lab 12/01/23 1344 12/02/23 1428 12/02/23 2020  NA 136 136 133*  K 4.1 4.3 4.6  CL 92* 91*  --   CO2 24 23  --   GLUCOSE 238* 174*  --   BUN 26* 52*  --   CREATININE 6.75* 10.44*  --   CALCIUM 8.8* 8.9  --    GFR: Estimated Creatinine Clearance: 10.2 mL/min (A) (by C-G formula based on SCr of 10.44 mg/dL (H)). Liver Function Tests: Recent Labs  Lab 12/01/23 1344 12/02/23 1428  AST 53* 52*  ALT 62* 61*  ALKPHOS 91 84  BILITOT 0.7 0.7  PROT 7.5 7.0  ALBUMIN 1.8* 1.8*   No results for input(s): LIPASE, AMYLASE in the last 168 hours. No results for input(s): AMMONIA in the last 168 hours. Coagulation Profile: Recent Labs  Lab 12/01/23 1344  INR 1.4*   Cardiac Enzymes: No results for input(s): CKTOTAL, CKMB, CKMBINDEX, TROPONINI in the last 168 hours. BNP (last 3 results) No results for input(s): PROBNP in the last 8760 hours. HbA1C: No results for input(s): HGBA1C in the last 72 hours. CBG: Recent Labs  Lab  12/03/23 0015  GLUCAP 129*   Lipid Profile: No results for input(s): CHOL, HDL, LDLCALC, TRIG, CHOLHDL, LDLDIRECT in the last 72 hours. Thyroid Function Tests: No results for input(s): TSH, T4TOTAL, FREET4, T3FREE, THYROIDAB in the last 72 hours. Anemia Panel: No results for input(s): VITAMINB12, FOLATE, FERRITIN, TIBC, IRON , RETICCTPCT in the last 72 hours. Urine analysis:    Component Value Date/Time   COLORURINE RED (A) 12/02/2023 1428   APPEARANCEUR TURBID (A) 12/02/2023 1428   LABSPEC  12/02/2023 1428    TEST NOT REPORTED DUE TO COLOR INTERFERENCE OF URINE PIGMENT   PHURINE  12/02/2023 1428    TEST NOT REPORTED DUE TO COLOR INTERFERENCE OF URINE PIGMENT   GLUCOSEU (A) 12/02/2023 1428    TEST NOT REPORTED DUE TO COLOR INTERFERENCE OF URINE PIGMENT   HGBUR (A) 12/02/2023 1428    TEST NOT REPORTED DUE TO COLOR INTERFERENCE OF URINE PIGMENT   BILIRUBINUR (A) 12/02/2023 1428    TEST NOT REPORTED DUE TO COLOR INTERFERENCE OF URINE PIGMENT   BILIRUBINUR neg 09/24/2012 1059   KETONESUR (A) 12/02/2023 1428    TEST NOT REPORTED DUE TO COLOR INTERFERENCE OF URINE PIGMENT   PROTEINUR (A) 12/02/2023 1428    TEST NOT REPORTED DUE TO COLOR INTERFERENCE OF URINE PIGMENT   UROBILINOGEN 0.2 09/24/2012 1059   UROBILINOGEN 0.2 09/11/2011 0843   NITRITE (A) 12/02/2023 1428    TEST NOT REPORTED DUE TO COLOR INTERFERENCE OF URINE PIGMENT   LEUKOCYTESUR (A) 12/02/2023 1428    TEST NOT REPORTED DUE TO COLOR INTERFERENCE OF URINE PIGMENT   Sepsis Labs: @LABRCNTIP (procalcitonin:4,lacticidven:4) ) Recent Results (from the past 240 hours)  Urine Culture     Status: None   Collection Time: 11/23/23  7:49 PM   Specimen: Urine, Clean Catch  Result Value Ref Range Status   Specimen Description URINE, CLEAN CATCH  Final   Special Requests NONE  Final   Culture   Final    NO GROWTH Performed at [facility], [address] [place], [place] [address]     Report Status 11/25/2023 FINAL  Final     Radiological Exams on Admission: No results found.  EKG: Independently  reviewed.  Normal sinus rhythm.  Assessment/Plan Principal Problem:   Symptomatic anemia Active Problems:   ESRD on dialysis (HCC)   Hypertension   DM (diabetes mellitus), type 2 with renal complications (HCC)   Renal transplant recipient   End-stage renal disease on hemodialysis (HCC)   Hematuria   Lethargy   GI bleed    Symptomatic anemia -    hemoglobin has decreased from 7.8 on 11/25/2022 and it is around 6.5.  Likely could be from GI or urinary source.  Patient has had EGD in July 2025 last month which showed esophageal ulcers without any stigmata for bleeding.  Also has  hematuria for which patient has follow-up with urologist next month at [facility].  Last week when patient was admitted urology was consulted by hospitalist and at that time requested if hemoglobin increased appropriately to follow-up with patient's outpatient urologist at [facility].  Repeat CBC is pending.  Will consult GI since patient stating he still has black stools.  On Protonix . ESRD on hemodialysis on Tuesday Thursday Saturday patient received 2 units of PRBC may need transfusion today and nephrology was consulted by ER physician.  Patient also has failed renal transplant on tacrolimus  and prednisone . Diabetes mellitus type 2 last hemoglobin A1c 3 weeks ago was 9.3.  Takes Tresiba  30 units at bedtime.  Presently is sliding scale coverage every 4 hourly.  I will order 20 units of Tresiba  may have to go on full dose if patient starts eating. BPH on Flomax. History of hypertension/cardiomyopathy and chronic sinus tachycardia on Toprol -XL. Lethargy patient appears lethargic.  But answers questions appropriately and moving all extremities.  MRI brain was negative.  Patient states he missed sleep for last 2 days.  VBG was unremarkable.  Ammonia and TSH is pending.  Since patient has symptomatic anemia requiring  blood transfusion will need close monitoring and more than 2 midnight stay.   DVT prophylaxis: SCDs. Code Status: Full code. Family Communication: Discussed with patient. Disposition Plan: Medical floor. Consults called: GI.  ER physician had notified nephrology. Admission status: Observation.

## 2023-12-03 NOTE — Consult Note (Addendum)
 Referring Provider: Dr. Franky, TRH Primary Care Physician:  [name], PA-C Primary Gastroenterologist:  [facility], seen by Dr. Shila first here  Reason for Consultation:  Black stool, anemia  HPI: Barry Nichols is a 48 y.o. 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  He was seen by our service last month for black stools and anemia.  He underwent EGD as below with esophageal ulcers and grade C esophagitis but no stigmata of bleeding.  Was treated with pantoprazole  twice daily.  No evidence of iron  deficiency from labs earlier this year.  Has history of folate deficiency.  Looks like baseline hemoglobin is between 7 and 8 g.  He was sent back to the hospital again on this occasion after being found to have a low hemoglobin after dialysis.  Hemoglobin 6.6 g today so he received 2 units of packed red blood cells.  Continues to report ongoing black stools.  Apparently also reports blood in his urine.  Reports ongoing issues with hiccups that will then cause him to become short of breath and sometimes vomit.  Of note, the patient was seen by our service here and had EGD on July 24 as above and then was seen again by [facility] on July 31 for ongoing complaints of hiccups.  They continued pantoprazole , added liquid Carafate  4 times a day and gave baclofen .   Reports being compliant with his medications.    EGD November 11, 2023:  - Esophageal ulcers with no bleeding and no stigmata of recent bleeding. Biopsied. - LA Grade C reflux esophagitis with no bleeding. - Gastritis. Biopsied. - Normal examined duodenum.  A. STOMACH, BIOPSY:       Gastric antral / oxyntic mucosa without significant diagnostic  alteration.      No H. pylori identified on HE stain.       Negative for intestinal metaplasia  or dysplasia.   B. ESOPHAGUS, BIOPSY:       Esophageal squamous mucosa without significant diagnostic  alteration.      Separated gastric oxyntic mucosa without significant diagnostic  alteration.      No evidence of intraepithelial lymphocytes or eosinophils.       No H. pylori identified on HE stain.       Negative for intestinal metaplasia or dysplasia.    He recently went for screening colonoscopy at [facility] on 10/19/2023.  2 adenomatous colon polyps were removed.  His bowel prep was not adequate with repeat recommended in 3 years.  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, Bilateral   Migraine    when I was young (08/25/2016   Posterior equinus, acquired    Bilateral   Pre-diabetes     Past Surgical History:  Procedure Laterality Date   AV FISTULA PLACEMENT Left 10/15/2016   Procedure: ARTERIOVENOUS (AV) FISTULA CREATION-LEFT ARM;  Surgeon:  Laurence Redell CROME, MD;  Location: [facility] OR;  Service: Vascular;  Laterality: Left;   DECOMPRESSION HIP-CORE Left 12/07/2022   Procedure: LEFT HIP CORE DECOMPRESSION WITH ILIAC CREST BONE MARROW ASPIRATE;  Surgeon: Genelle Standing, MD;  Location: [facility] OR;  Service: Orthopedics;  Laterality: Left;   ESOPHAGOGASTRODUODENOSCOPY N/A 11/11/2023   Procedure: EGD (ESOPHAGOGASTRODUODENOSCOPY);  Surgeon: Nandigam, Kavitha V, MD;  Location: [facility] ENDOSCOPY;  Service: Gastroenterology;  Laterality: N/A;   INSERTION OF DIALYSIS CATHETER Right 10/15/2016   Procedure: INSERTION OF DIALYSIS CATHETER;  Surgeon: Laurence Redell CROME, MD;  Location: [facility] OR;  Service: Vascular;  Laterality: Right;    KIDNEY TRANSPLANT  06/15/2020   [facility]   WISDOM TOOTH EXTRACTION      Prior to Admission medications   Medication Sig Start Date End Date Taking? Authorizing Provider  cinacalcet  (SENSIPAR ) 30 MG tablet Take 30 mg by mouth daily.   Yes [provider]  Darbepoetin Alfa  (ARANESP ) 200 MCG/0.4ML SOSY injection Inject 0.4 mLs (200 mcg total) into the skin every Wednesday at 6 PM. 11/26/23   Amin, Ankit C, MD  dorzolamide -timolol  (COSOPT ) 2-0.5 % ophthalmic solution Place 1 drop into both eyes 2 (two) times daily. 11/19/23   [provider]  icosapent  Ethyl (VASCEPA ) 1 g capsule Take 2 g by mouth 2 (two) times daily. Patient not taking: Reported on 11/23/2023    [provider]  insulin  degludec (TRESIBA ) 200 UNIT/ML FlexTouch Pen Inject 30 Units into the skin at bedtime. 09/23/21   [provider]  insulin  lispro (HUMALOG KWIKPEN) 200 UNIT/ML KwikPen Inject 25-40 Units into the skin 3 (three) times daily as needed (high blood sugar). 09/02/20   [provider]  metoprolol  succinate (TOPROL -XL) 25 MG 24 hr tablet Take 1 tablet (25 mg total) by mouth every evening. 11/11/23   Ghimire, Donalda HERO, MD  OLANZapine  (ZYPREXA ) 5 MG tablet Take 1 tablet (5 mg total) by mouth daily. 11/12/23   Ghimire, Donalda HERO, MD  pantoprazole  (PROTONIX ) 40 MG tablet Take 1 tablet (40 mg total) by mouth 2 (two) times daily before a meal. Take twice daily x 6 weeks, then switch to once daily. 11/11/23   Ghimire, Donalda HERO, MD  predniSONE  (DELTASONE ) 5 MG tablet Take 5 mg by mouth daily.    [provider]  sucralfate  (CARAFATE ) 1 GM/10ML suspension Take 1 g by mouth every 6 (six) hours. 11/18/23 12/18/23  [provider]  sulfamethoxazole -trimethoprim  (BACTRIM ) 400-80 MG tablet Take 1 tablet by mouth every Monday, Wednesday, and Friday.    [provider]  tacrolimus  (PROGRAF ) 1 MG capsule Take 2-3 mg by mouth See admin instructions. Take 3 capsules  (3mg ) in the morning and then take 2 capsules (2mg ) at bedtime.    [provider]  tadalafil (CIALIS) 20 MG tablet Take 20 mg by mouth daily as needed for erectile dysfunction.    [provider]  traZODone  (DESYREL ) 50 MG tablet Take 50 mg by mouth at bedtime.    [provider]  Vitamin D, Ergocalciferol, (DRISDOL) 1.25 MG (50000 UNIT) CAPS capsule Take 50,000 Units by mouth every 7 (seven) days. 03/02/23   [provider]    Current Facility-Administered Medications  Medication Dose Route Frequency Provider Last Rate Last Admin   acetaminophen  (TYLENOL ) tablet 650 mg  650 mg Oral Q6H PRN Franky Redia SAILOR, MD       Or   acetaminophen  (TYLENOL ) suppository 650 mg  650 mg Rectal Q6H PRN Franky Redia SAILOR, MD  Chlorhexidine  Gluconate Cloth 2 % PADS 6 each  6 each Topical Q0600 Melia Lynwood ORN, MD   6 each at 12/03/23 0545   cinacalcet  (SENSIPAR ) tablet 30 mg  30 mg Oral Daily Franky Redia SAILOR, MD       dorzolamide -timolol  (COSOPT ) 2-0.5 % ophthalmic solution 1 drop  1 drop Both Eyes BID Franky Redia SAILOR, MD       insulin  aspart (novoLOG ) injection 0-6 Units  0-6 Units Subcutaneous Q4H Odell Celinda Balo, MD       insulin  glargine-yfgn (SEMGLEE ) injection 10 Units  10 Units Subcutaneous Daily Odell Celinda Balo, MD       metoprolol  succinate (TOPROL -XL) 24 hr tablet 25 mg  25 mg Oral QPM Franky Redia SAILOR, MD       OLANZapine  (ZYPREXA ) tablet 5 mg  5 mg Oral Daily Franky Redia SAILOR, MD       pantoprazole  (PROTONIX ) injection 40 mg  40 mg Intravenous Q12H Kakrakandy, Arshad N, MD   40 mg at 12/03/23 0414   predniSONE  (DELTASONE ) tablet 5 mg  5 mg Oral Daily Franky Redia SAILOR, MD       sulfamethoxazole -trimethoprim  (BACTRIM ) 400-80 MG per tablet 1 tablet  1 tablet Oral Q M,W,F Kakrakandy, Arshad N, MD       tacrolimus  (PROGRAF ) capsule 3 mg  3 mg Oral q morning Laron Lynwood, [facility]       And   tacrolimus  (PROGRAF ) capsule 2 mg  2 mg  Oral QHS Laron Lynwood, RPH        Allergies as of 12/02/2023   (No Known Allergies)    Family History  Problem Relation Age of Onset   Hypertension Father        Also maternal grandmother   Diabetes Father        also maternal grandmother   CVA Father    Cancer Maternal Grandfather        lung   Heart attack Maternal Grandfather     Social History   Socioeconomic History   Marital status: Married    Spouse name: Not on file   Number of children: 1   Years of education: BA   Highest education level: Not on file  Occupational History   Not on file  Tobacco Use   Smoking status: Never   Smokeless tobacco: Never  Vaping Use   Vaping status: Never Used  Substance and Sexual Activity   Alcohol use: No    Comment: rare   Drug use: No    Comment: formerly used Marijuana   Sexual activity: Not on file  Other Topics Concern   Not on file  Social History Narrative   Drinks tea 3-5 times a week    Social Drivers of Health   Financial Resource Strain: Not on file  Food Insecurity: No Food Insecurity (12/03/2023)   Hunger Vital Sign    Worried About Running Out of Food in the Last Year: Never true    Ran Out of Food in the Last Year: Never true  Transportation Needs: No Transportation Needs (12/03/2023)   PRAPARE - Administrator, Civil Service (Medical): No    Lack of Transportation (Non-Medical): No  Physical Activity: Not on file  Stress: Not on file  Social Connections: Not on file  Intimate Partner Violence: Not At Risk (12/03/2023)   Humiliation, Afraid, Rape, and Kick questionnaire    Fear of Current or Ex-Partner: No    Emotionally Abused: No  Physically Abused: No    Sexually Abused: No    Review of Systems: ROS is O/W negative except as mentioned in HPI.  Physical Exam: Vital signs in last 24 hours: Temp:  [97.5 F (36.4 C)-99.3 F (37.4 C)] 97.5 F (36.4 C) (08/15 0830) Pulse Rate:  [68-108] 85 (08/15 0900) Resp:  [11-23] 16 (08/15  0900) BP: (123-189)/(54-113) 181/96 (08/15 0900) SpO2:  [93 %-100 %] 100 % (08/15 0900) Weight:  [97.5 kg-98 kg] 98 kg (08/15 0756)   General:  Alert, Well-developed, well-nourished, pleasant and cooperative in NAD Head:  Normocephalic and atraumatic. Eyes:  Sclera clear, no icterus.  Conjunctiva pink. Ears:  Normal auditory acuity. Mouth:  No deformity or lesions.   Lungs:  Clear throughout to auscultation.  No wheezes, crackles, or rhonchi.  Heart:  Regular rate and rhythm; no murmurs, clicks, rubs, or gallops. Abdomen:  Soft, non-distended.  BS present.  Non-tender.  Msk:  Symmetrical without gross deformities.  Pulses:  Normal pulses noted. Extremities:  Without clubbing or edema. Neurologic:  Alert and oriented x 4;  grossly normal neurologically. Skin:  Intact without significant lesions or rashes. Psych:  Alert and cooperative. Normal mood and affect.  Intake/Output from previous day: 08/14 0701 - 08/15 0700 In: 432 [Blood:432] Out: 50 [Urine:50] Intake/Output this shift: Total I/O In: 372 [Blood:372] Out: -   Lab Results: Recent Labs    12/01/23 1344 12/02/23 1428 12/02/23 2020  WBC 7.0 7.2  --   HGB 7.2* 6.6* 6.5*  HCT 23.3* 21.5* 19.0*  PLT 420* 408*  --    BMET Recent Labs    12/01/23 1344 12/02/23 1428 12/02/23 2020  NA 136 136 133*  K 4.1 4.3 4.6  CL 92* 91*  --   CO2 24 23  --   GLUCOSE 238* 174*  --   BUN 26* 52*  --   CREATININE 6.75* 10.44*  --   CALCIUM 8.8* 8.9  --    LFT Recent Labs    12/02/23 1428  PROT 7.0  ALBUMIN 1.8*  AST 52*  ALT 61*  ALKPHOS 84  BILITOT 0.7   PT/INR Recent Labs    12/01/23 1344  LABPROT 17.9*  INR 1.4*   Hepatitis Panel Recent Labs    12/02/23 2339  HEPBSAG NON REACTIVE   Studies/Results: MR BRAIN WO CONTRAST Result Date: 12/03/2023 CLINICAL DATA:  48 year old male dialysis patient. Anemia requiring transfusion. Suspected GI bleeding. Altered mental status. EXAM: MRI HEAD WITHOUT CONTRAST  TECHNIQUE: Multiplanar, multiecho pulse sequences of the brain and surrounding structures were obtained without intravenous contrast. COMPARISON:  None Available. FINDINGS: Brain: No restricted diffusion to suggest acute infarction. No midline shift, mass effect, evidence of mass lesion, ventriculomegaly, extra-axial collection or acute intracranial hemorrhage. Cervicomedullary junction and pituitary are within normal limits. Largely normal for age gray and white matter signal. There are scattered small subcortical white matter T2 and FLAIR hyperintense foci (such as on series 11, image 36) which may be mildly advanced for age but are in a nonspecific configuration. No cortical encephalomalacia. No chronic cerebral blood products on SWI. Deep gray nuclei, brainstem, and cerebellum appear negative. Vascular: Major intracranial vascular flow voids are preserved, distal right vertebral artery appears to be dominant, normal variant. Skull and upper cervical spine: Abnormally decreased background T1 marrow signal in the skull base and visible spine. This could be red marrow reactivation or sequelae of chronic kidney disease. Otherwise negative visible cervical spine. Sinuses/Orbits: Negative orbits. Small left maxillary sinus retention  cyst. Other: Mastoids are clear. Grossly normal visible internal auditory structures. Negative visible scalp and face. IMPRESSION: 1. No acute intracranial abnormality. 2. Decreased T1 bone marrow signal which is probably related to red marrow reactivation and/or chronic renal disease in this setting. Electronically Signed   By: VEAR Hurst M.D.   On: 12/03/2023 04:27   IMPRESSION:  48 y.o. 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  Ongoing black stools with intermittent drops in hemoglobin  requiring transfusion.  Concern for ongoing GI bleeding.  Hemoglobin 6.6 g has been transfused with 2 units of packed red blood cells.  ? If this could be due to the esophageal ulcers and grade C esophagitis seen on his EGD just last month.  No evidence of iron  deficiency on labs from earlier this year.  It looks like his baseline hemoglobin is between 7 and 8 g.  Likely a component of anemia of chronic disease related to his chronic kidney disease.  Hematuria: Also reports seeing blood in his urine, which could also be contributing to his blood loss.  Supposed to see urology.  Mildly elevated liver enzymes with history of hepatic steatosis  History of folate deficiency from previous labs.  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 - Monitor hemoglobin and transfuse further if needed. -Will plan for enteroscopy on 8/16 to reassess esophagus/ulcers and evaluate for any other source of bleeding. -Urology consult or follow-up as well.  **Of note, the patient was seen by our service here and had EGD on July 24 and then was seen again by [facility] on July 31 for ongoing complaints of hiccups.  They continued pantoprazole , added liquid Carafate  4 times a day and gave baclofen .  Should continue to follow-up with [facility] upon discharge.   Harlene BIRCH. Zehr  12/03/2023, 9:17 AM    Attending physician's note   I have taken history, reviewed the chart and examined the patient. I performed a substantive portion of this encounter, including complete performance of at least one of the key components, in conjunction with the APP. I agree with the Advanced Practitioner's note, impression and recommendations.   Recurrent melena. Hb 6.6 (adm) s/p 2U. EGD July 24 with eso ulcers, LA Gd C esophagitis. Note that his baseline hemoglobin ranges from 7-8.  ESRD on HD after failed renal transplant (rejection) on  immunosuppressive meds  Colonic polyps on recent colonoscopy at [facility] 10/19/2023. Rpt 3 yrs d/t quality of prep.  MASLD with mildly elevated AST/ALT. No cirrhosis.  Associated significant hematuria  Plan: - Protonix  40 BID - Push enteroscopy tomorrow-to ensure healing of eso ulcers/eval for Px SB lesions. If neg and he still has problems, will consider VCE. - Trend CBC. Keep Hb>7 - Recommend urology consultation for hematuria.  I have discussed risks and benefits.  All questions were answered.  Anselm Bring, MD [facility] [phone]

## 2023-12-03 NOTE — Plan of Care (Signed)

## 2023-12-04 ENCOUNTER — Inpatient Hospital Stay (HOSPITAL_COMMUNITY)

## 2023-12-04 ENCOUNTER — Encounter (HOSPITAL_COMMUNITY): Payer: Self-pay | Admitting: Internal Medicine

## 2023-12-04 ENCOUNTER — Encounter (HOSPITAL_COMMUNITY)
Admission: EM | Disposition: A | Discharge status: Home / Self Care | Payer: Self-pay | Source: Home / Self Care | Attending: Internal Medicine

## 2023-12-04 DIAGNOSIS — K3189 Other diseases of stomach and duodenum: Secondary | ICD-10-CM

## 2023-12-04 DIAGNOSIS — D649 Anemia, unspecified: Secondary | ICD-10-CM | POA: Diagnosis not present

## 2023-12-04 DIAGNOSIS — R319 Hematuria, unspecified: Secondary | ICD-10-CM | POA: Diagnosis not present

## 2023-12-04 DIAGNOSIS — K922 Gastrointestinal hemorrhage, unspecified: Secondary | ICD-10-CM | POA: Diagnosis not present

## 2023-12-04 DIAGNOSIS — N186 End stage renal disease: Secondary | ICD-10-CM | POA: Diagnosis not present

## 2023-12-04 HISTORY — PX: BONE BIOPSY: SHX375

## 2023-12-04 HISTORY — PX: ENTEROSCOPY: SHX5533

## 2023-12-04 LAB — BPAM RBC
Blood Product Expiration Date: 202509132359
Blood Product Expiration Date: 202509132359
ISSUE DATE / TIME: 202508150005
ISSUE DATE / TIME: 202508150504
Unit Type and Rh: 5100
Unit Type and Rh: 5100

## 2023-12-04 LAB — POCT I-STAT, CHEM 8
BUN: 37 mg/dL — ABNORMAL HIGH (ref 6–20)
Calcium, Ion: 1.06 mmol/L — ABNORMAL LOW (ref 1.15–1.40)
Chloride: 94 mmol/L — ABNORMAL LOW (ref 98–111)
Creatinine, Ser: 9.7 mg/dL — ABNORMAL HIGH (ref 0.61–1.24)
Glucose, Bld: 90 mg/dL (ref 70–99)
HCT: 26 % — ABNORMAL LOW (ref 39.0–52.0)
Hemoglobin: 8.8 g/dL — ABNORMAL LOW (ref 13.0–17.0)
Potassium: 4.3 mmol/L (ref 3.5–5.1)
Sodium: 133 mmol/L — ABNORMAL LOW (ref 135–145)
TCO2: 26 mmol/L (ref 22–32)

## 2023-12-04 LAB — TYPE AND SCREEN
ABO/RH(D): O POS
Antibody Screen: NEGATIVE
Unit division: 0
Unit division: 0

## 2023-12-04 LAB — GLUCOSE, CAPILLARY
Glucose-Capillary: 82 mg/dL (ref 70–99)
Glucose-Capillary: 79 mg/dL (ref 70–99)
Glucose-Capillary: 85 mg/dL (ref 70–99)
Glucose-Capillary: 90 mg/dL (ref 70–99)
Glucose-Capillary: 104 mg/dL — ABNORMAL HIGH (ref 70–99)
Glucose-Capillary: 204 mg/dL — ABNORMAL HIGH (ref 70–99)

## 2023-12-04 LAB — HEPATITIS B SURFACE ANTIBODY, QUANTITATIVE: Hep B S AB Quant (Post): 222 m[IU]/mL

## 2023-12-04 SURGERY — ENTEROSCOPY
Anesthesia: Monitor Anesthesia Care

## 2023-12-04 MED ORDER — FENTANYL CITRATE (PF) 100 MCG/2ML IJ SOLN
INTRAMUSCULAR | Status: AC
Start: 2023-12-04 — End: 2023-12-04
  Filled 2023-12-04: qty 2

## 2023-12-04 MED ORDER — OXYCODONE HCL 5 MG/5ML PO SOLN: 5.0000 mg | Freq: Once | ORAL | Status: DC | PRN

## 2023-12-04 MED ORDER — FERRIC CITRATE 1 GM 210 MG(FE) PO TABS
420.0000 mg | ORAL_TABLET | Freq: Three times a day (TID) | ORAL | Status: DC
  Administered 2023-12-04 – 2023-12-05 (×2): 420 mg via ORAL
  Filled 2023-12-04 (×3): qty 2

## 2023-12-04 MED ORDER — PROPOFOL 10 MG/ML IV BOLUS
INTRAVENOUS | Status: DC | PRN
  Administered 2023-12-04: 30 mg via INTRAVENOUS
  Administered 2023-12-04: 50 mg via INTRAVENOUS
  Administered 2023-12-04: 40 mg via INTRAVENOUS
  Administered 2023-12-04: 20 mg via INTRAVENOUS
  Administered 2023-12-04: 30 mg via INTRAVENOUS

## 2023-12-04 MED ORDER — PROPOFOL 500 MG/50ML IV EMUL
INTRAVENOUS | Status: DC | PRN
  Administered 2023-12-04: 150 ug/kg/min via INTRAVENOUS

## 2023-12-04 MED ORDER — OXYCODONE HCL 5 MG PO TABS
5.0000 mg | ORAL_TABLET | ORAL | Status: DC | PRN
  Administered 2023-12-05: 5 mg via ORAL
  Filled 2023-12-04: qty 1

## 2023-12-04 MED ORDER — FENTANYL CITRATE (PF) 250 MCG/5ML IJ SOLN
INTRAMUSCULAR | Status: DC | PRN
  Administered 2023-12-04: 50 ug via INTRAVENOUS

## 2023-12-04 MED ORDER — TRAZODONE HCL 50 MG PO TABS
50.0000 mg | ORAL_TABLET | Freq: Once | ORAL | Status: AC
  Administered 2023-12-04: 50 mg via ORAL
  Filled 2023-12-04: qty 1

## 2023-12-04 MED ORDER — ONDANSETRON HCL 4 MG/2ML IJ SOLN: 4.0000 mg | Freq: Four times a day (QID) | INTRAMUSCULAR | Status: DC | PRN

## 2023-12-04 MED ORDER — FENTANYL CITRATE (PF) 100 MCG/2ML IJ SOLN: 25.0000 ug | INTRAMUSCULAR | Status: DC | PRN

## 2023-12-04 MED ORDER — LIDOCAINE 2% (20 MG/ML) 5 ML SYRINGE
INTRAMUSCULAR | Status: DC | PRN
Start: 2023-12-04 — End: 2023-12-04
  Administered 2023-12-04: 80 mg via INTRAVENOUS

## 2023-12-04 MED ORDER — SODIUM CHLORIDE 0.9 % IR SOLN: 3000.0000 mL | Status: DC

## 2023-12-04 MED ORDER — GLUCAGON HCL RDNA (DIAGNOSTIC) 1 MG IJ SOLR
INTRAMUSCULAR | Status: AC
  Filled 2023-12-04: qty 1

## 2023-12-04 MED ORDER — OXYCODONE HCL 5 MG PO TABS: 5.0000 mg | ORAL_TABLET | Freq: Once | ORAL | Status: DC | PRN

## 2023-12-04 NOTE — Anesthesia Preprocedure Evaluation (Signed)
 Anesthesia Evaluation  Patient identified by MRN, date of birth, ID band Patient awake    Reviewed: Allergy & Precautions, H&P , NPO status , Patient's Chart, lab work & pertinent test results  Airway Mallampati: II   Neck ROM: full    Dental   Pulmonary neg pulmonary ROS   breath sounds clear to auscultation       Cardiovascular hypertension, +CHF   Rhythm:regular Rate:Normal     Neuro/Psych  Headaches PSYCHIATRIC DISORDERS Anxiety        GI/Hepatic PUD,GERD  ,,  Endo/Other  diabetes, Type 2  Class 3 obesity  Renal/GU ESRFRenal disease     Musculoskeletal  (+) Arthritis ,    Abdominal   Peds  Hematology   Anesthesia Other Findings   Reproductive/Obstetrics                              Anesthesia Physical Anesthesia Plan  ASA: 3  Anesthesia Plan: MAC   Post-op Pain Management:    Induction: Intravenous  PONV Risk Score and Plan: 1 and Propofol  infusion and Treatment may vary due to age or medical condition  Airway Management Planned: Nasal Cannula  Additional Equipment:   Intra-op Plan:   Post-operative Plan:   Informed Consent: I have reviewed the patients History and Physical, chart, labs and discussed the procedure including the risks, benefits and alternatives for the proposed anesthesia with the patient or authorized representative who has indicated his/her understanding and acceptance.     Dental advisory given  Plan Discussed with: CRNA, Anesthesiologist and Surgeon  Anesthesia Plan Comments:         Anesthesia Quick Evaluation

## 2023-12-04 NOTE — Progress Notes (Signed)
 Order was placed for catheter placement, MD Winter with urology came to [phone] and bladder scanned this patient. Determined that catheter would not be needed. New order for urine culture placed and urine has been collected and sent to lab. No catheter has been placed per MD Winter.

## 2023-12-04 NOTE — Plan of Care (Signed)
  Problem: Education: Goal: Knowledge of General Education information will improve Description: Including pain rating scale, medication(s)/side effects and non-pharmacologic comfort measures Outcome: Progressing   Problem: Health Behavior/Discharge Planning: Goal: Ability to manage health-related needs will improve Outcome: Progressing   Problem: Safety: Goal: Ability to remain free from injury will improve Outcome: Progressing   Problem: Health Behavior/Discharge Planning: Goal: Ability to manage health-related needs will improve Outcome: Progressing

## 2023-12-04 NOTE — Plan of Care (Signed)
   Problem: Health Behavior/Discharge Planning: Goal: Ability to manage health-related needs will improve Outcome: Progressing   Problem: Clinical Measurements: Goal: Ability to maintain clinical measurements within normal limits will improve Outcome: Progressing Goal: Will remain free from infection Outcome: Progressing

## 2023-12-04 NOTE — Consult Note (Signed)
 Urology Consult   Physician requesting consult: Cherryl Odell Castor, MD   Reason for consult: Hematuria   History of Present Illness: Barry Nichols is a 48 y.o. 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  His baseline hemoglobin level is 7-8..  The patient reports a 1 month history of bloody stools as well as a 1 week history of intermittent episodes of tea-colored urine.  He does note dysuria for the past week as well.  From urinary aspect, he states that he only makes approximately 200 mL of urine per day.  Bladder scan in the patient's room reveals that his bladder is completely decompressed.  CT stone study on admission was unremarkable from a GU standpoint.  The patient had a urinalysis on 12/02/2023 concerning for a possible UTI, but no urine culture was obtained.  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, Bilateral   Migraine    when I was young (08/25/2016   Posterior equinus, acquired    Bilateral   Pre-diabetes     Past Surgical History:  Procedure Laterality Date   AV FISTULA PLACEMENT Left 10/15/2016   Procedure: ARTERIOVENOUS (AV) FISTULA CREATION-LEFT ARM;  Surgeon: Laurence Redell CROME, MD;  Location: [facility] OR;  Service: Vascular;  Laterality: Left;   DECOMPRESSION HIP-CORE Left 12/07/2022   Procedure: LEFT HIP CORE DECOMPRESSION WITH ILIAC CREST BONE MARROW ASPIRATE;  Surgeon: Genelle Standing, MD;  Location: [facility] OR;  Service: Orthopedics;  Laterality: Left;   ESOPHAGOGASTRODUODENOSCOPY N/A  11/11/2023   Procedure: EGD (ESOPHAGOGASTRODUODENOSCOPY);  Surgeon: Nandigam, Kavitha V, MD;  Location: [facility] ENDOSCOPY;  Service: Gastroenterology;  Laterality: N/A;   INSERTION OF DIALYSIS CATHETER Right 10/15/2016   Procedure: INSERTION OF DIALYSIS CATHETER;  Surgeon: Laurence Redell CROME, MD;  Location: [facility] OR;  Service: Vascular;  Laterality: Right;   KIDNEY TRANSPLANT  06/15/2020   [facility]   WISDOM TOOTH EXTRACTION      Current Hospital Medications:  Home Meds:  Current Meds  Medication Sig   carvedilol  (COREG ) 12.5 MG tablet Take 12.5 mg by mouth 2 (two) times daily with a meal.   cinacalcet  (SENSIPAR ) 30 MG tablet Take 30 mg by mouth daily.   DM-APAP-CPM (CORICIDIN HBP PO) Take 2 capsules by mouth as needed. Gel Capsules   dorzolamide -timolol  (COSOPT ) 2-0.5 % ophthalmic solution Place 1 drop into both eyes 2 (two) times daily.   icosapent  Ethyl (VASCEPA ) 1 g capsule Take 2 g by mouth 2 (two) times daily.   insulin  degludec (TRESIBA ) 200 UNIT/ML FlexTouch Pen Inject 30 Units into the skin at bedtime as needed.   insulin  lispro (HUMALOG KWIKPEN) 200 UNIT/ML KwikPen Inject 25-40 Units into the skin 3 (three) times daily as needed (high blood sugar).   metoprolol  succinate (TOPROL -XL) 25 MG 24 hr tablet Take 1 tablet (25 mg total) by mouth every evening.   pantoprazole  (PROTONIX ) 40 MG tablet Take 1 tablet (  40 mg total) by mouth 2 (two) times daily before a meal. Take twice daily x 6 weeks, then switch to once daily.   predniSONE  (DELTASONE ) 5 MG tablet Take 5 mg by mouth daily.   Pseudoeph-Doxylamine-DM-APAP (NYQUIL PO) Take 1 Dose by mouth as needed (cough). Liquid Nyquid   sucralfate  (CARAFATE ) 1 GM/10ML suspension Take 1 g by mouth every 6 (six) hours.   sulfamethoxazole -trimethoprim  (BACTRIM ) 400-80 MG tablet Take 1 tablet by mouth every Monday, Wednesday, and Friday.   tacrolimus  (PROGRAF ) 1 MG capsule Take 2-3 mg by mouth See admin instructions. Take 3 capsules (3mg ) in the  morning and then take 2 capsules (2mg ) at bedtime.   tadalafil (CIALIS) 20 MG tablet Take 20 mg by mouth daily as needed for erectile dysfunction.   traZODone  (DESYREL ) 50 MG tablet Take 50 mg by mouth at bedtime.   Vitamin D, Ergocalciferol, (DRISDOL) 1.25 MG (50000 UNIT) CAPS capsule Take 50,000 Units by mouth every 7 (seven) days.    Scheduled Meds:  Chlorhexidine  Gluconate Cloth  6 each Topical Q0600   cinacalcet   30 mg Oral Daily   dorzolamide -timolol   1 drop Both Eyes BID   insulin  aspart  0-6 Units Subcutaneous Q4H   insulin  glargine-yfgn  10 Units Subcutaneous Daily   metoprolol  succinate  25 mg Oral QPM   OLANZapine   5 mg Oral Daily   pantoprazole  (PROTONIX ) IV  40 mg Intravenous Q12H   predniSONE   5 mg Oral Daily   sulfamethoxazole -trimethoprim   1 tablet Oral Q M,W,F   tacrolimus   3 mg Oral q morning   And   tacrolimus   2 mg Oral QHS   Continuous Infusions:  sodium chloride  irrigation     PRN Meds:.acetaminophen  **OR** acetaminophen   Allergies: No Known Allergies  Family History  Problem Relation Age of Onset   Hypertension Father        Also maternal grandmother   Diabetes Father        also maternal grandmother   CVA Father    Cancer Maternal Grandfather        lung   Heart attack Maternal Grandfather     Social History:  reports that he has never smoked. He has never used smokeless tobacco. He reports that he does not drink alcohol and does not use drugs.  ROS: A complete review of systems was performed.  All systems are negative except for pertinent findings as noted.  Physical Exam:  Vital signs in last 24 hours: Temp:  [98.3 F (36.8 C)-98.8 F (37.1 C)] 98.6 F (37 C) (08/16 1015) Pulse Rate:  [86-96] 86 (08/16 1015) Resp:  [17-24] 21 (08/16 1015) BP: (133-165)/(87-98) 151/90 (08/16 1015) SpO2:  [95 %-100 %] 98 % (08/16 1015) Constitutional:  Alert and oriented, No acute distress Psychiatric: Normal mood and affect  Laboratory Data:  Recent  Labs    12/02/23 1428 12/02/23 2020 12/03/23 2114 12/04/23 0913  WBC 7.2  --  7.4  --   HGB 6.6* 6.5* 8.1* 8.8*  HCT 21.5* 19.0* 25.4* 26.0*  PLT 408*  --  359  --     Recent Labs    12/02/23 1428 12/02/23 2020 12/04/23 0913  NA 136 133* 133*  K 4.3 4.6 4.3  CL 91*  --  94*  GLUCOSE 174*  --  90  BUN 52*  --  37*  CALCIUM 8.9  --   --   CREATININE 10.44*  --  9.70*     Results for orders placed or  performed during the hospital encounter of 12/02/23 (from the past 24 hours)  Glucose, capillary     Status: Abnormal   Collection Time: 12/03/23  4:53 PM  Result Value Ref Range   Glucose-Capillary 122 (H) 70 - 99 mg/dL  Glucose, capillary     Status: Abnormal   Collection Time: 12/03/23  8:14 PM  Result Value Ref Range   Glucose-Capillary 156 (H) 70 - 99 mg/dL  Ammonia     Status: None   Collection Time: 12/03/23  9:14 PM  Result Value Ref Range   Ammonia 33 9 - 35 umol/L  CBC with Differential/Platelet     Status: Abnormal   Collection Time: 12/03/23  9:14 PM  Result Value Ref Range   WBC 7.4 4.0 - 10.5 K/uL   RBC 2.98 (L) 4.22 - 5.81 MIL/uL   Hemoglobin 8.1 (L) 13.0 - 17.0 g/dL   HCT 74.5 (L) 60.9 - 47.9 %   MCV 85.2 80.0 - 100.0 fL   MCH 27.2 26.0 - 34.0 pg   MCHC 31.9 30.0 - 36.0 g/dL   RDW 83.6 (H) 88.4 - 84.4 %   Platelets 359 150 - 400 K/uL   nRBC 0.0 0.0 - 0.2 %   Neutrophils Relative % 72 %   Neutro Abs 5.3 1.7 - 7.7 K/uL   Lymphocytes Relative 16 %   Lymphs Abs 1.2 0.7 - 4.0 K/uL   Monocytes Relative 8 %   Monocytes Absolute 0.6 0.1 - 1.0 K/uL   Eosinophils Relative 3 %   Eosinophils Absolute 0.2 0.0 - 0.5 K/uL   Basophils Relative 0 %   Basophils Absolute 0.0 0.0 - 0.1 K/uL   Immature Granulocytes 1 %   Abs Immature Granulocytes 0.06 0.00 - 0.07 K/uL  Glucose, capillary     Status: Abnormal   Collection Time: 12/03/23 11:30 PM  Result Value Ref Range   Glucose-Capillary 102 (H) 70 - 99 mg/dL  Glucose, capillary     Status: None   Collection  Time: 12/04/23  4:27 AM  Result Value Ref Range   Glucose-Capillary 82 70 - 99 mg/dL  Glucose, capillary     Status: None   Collection Time: 12/04/23  8:18 AM  Result Value Ref Range   Glucose-Capillary 79 70 - 99 mg/dL  I-STAT, chem 8     Status: Abnormal   Collection Time: 12/04/23  9:13 AM  Result Value Ref Range   Sodium 133 (L) 135 - 145 mmol/L   Potassium 4.3 3.5 - 5.1 mmol/L   Chloride 94 (L) 98 - 111 mmol/L   BUN 37 (H) 6 - 20 mg/dL   Creatinine, Ser 0.29 (H) 0.61 - 1.24 mg/dL   Glucose, Bld 90 70 - 99 mg/dL   Calcium, Ion 8.93 (L) 1.15 - 1.40 mmol/L   TCO2 26 22 - 32 mmol/L   Hemoglobin 8.8 (L) 13.0 - 17.0 g/dL   HCT 73.9 (L) 60.9 - 47.9 %  Glucose, capillary     Status: None   Collection Time: 12/04/23  9:56 AM  Result Value Ref Range   Glucose-Capillary 85 70 - 99 mg/dL  Glucose, capillary     Status: None   Collection Time: 12/04/23 12:15 PM  Result Value Ref Range   Glucose-Capillary 90 70 - 99 mg/dL   No results found for this or any previous visit (from the past 240 hours).  Renal Function: Recent Labs    12/01/23 1344 12/02/23 1428 12/04/23 0913  CREATININE 6.75* 10.44* 9.70*  Estimated Creatinine Clearance: 11.1 mL/min (A) (by C-G formula based on SCr of 9.7 mg/dL (H)).  Radiologic Imaging: MR BRAIN WO CONTRAST Result Date: 12/03/2023 CLINICAL DATA:  48 year old male dialysis patient. Anemia requiring transfusion. Suspected GI bleeding. Altered mental status. EXAM: MRI HEAD WITHOUT CONTRAST TECHNIQUE: Multiplanar, multiecho pulse sequences of the brain and surrounding structures were obtained without intravenous contrast. COMPARISON:  None Available. FINDINGS: Brain: No restricted diffusion to suggest acute infarction. No midline shift, mass effect, evidence of mass lesion, ventriculomegaly, extra-axial collection or acute intracranial hemorrhage. Cervicomedullary junction and pituitary are within normal limits. Largely normal for age gray and white matter  signal. There are scattered small subcortical white matter T2 and FLAIR hyperintense foci (such as on series 11, image 36) which may be mildly advanced for age but are in a nonspecific configuration. No cortical encephalomalacia. No chronic cerebral blood products on SWI. Deep gray nuclei, brainstem, and cerebellum appear negative. Vascular: Major intracranial vascular flow voids are preserved, distal right vertebral artery appears to be dominant, normal variant. Skull and upper cervical spine: Abnormally decreased background T1 marrow signal in the skull base and visible spine. This could be red marrow reactivation or sequelae of chronic kidney disease. Otherwise negative visible cervical spine. Sinuses/Orbits: Negative orbits. Small left maxillary sinus retention cyst. Other: Mastoids are clear. Grossly normal visible internal auditory structures. Negative visible scalp and face. IMPRESSION: 1. No acute intracranial abnormality. 2. Decreased T1 bone marrow signal which is probably related to red marrow reactivation and/or chronic renal disease in this setting. Electronically Signed   By: VEAR Hurst M.D.   On: 12/03/2023 04:27    I independently reviewed the above imaging studies.  Impression/Recommendation 48 year old male with history of ESRD and failed renal transplant now with gross hematuria.  -CT stone study reviewed and discussed with the patient.  No acute urologic abnormalities noted - PVR shows that the bladder is completely decompressed meaning that there is no clot debris or signs of active hemorrhage within the bladder.  The patient is currently voiding without difficulty and feels like he is emptying his bladder well with mild dysuria.  Recommend obtaining a urinalysis and urine culture and started on empiric antibiotics to rule out a UTI as a source of his hematuria - The patient already has a appointment for cystoscopy with Dr. Evalene Nam in early September to investigate his  hematuria. - Call back as needed  Lonni Han, MD [facility] 12/04/2023, 2:17 PM

## 2023-12-04 NOTE — Anesthesia Postprocedure Evaluation (Signed)
 Anesthesia Post Note  Patient: Barry Nichols  Procedure(s) Performed: ENTEROSCOPY BIOPSY, GI     Patient location during evaluation: Endoscopy Anesthesia Type: MAC Level of consciousness: awake and alert Pain management: pain level controlled Vital Signs Assessment: post-procedure vital signs reviewed and stable Respiratory status: spontaneous breathing, nonlabored ventilation, respiratory function stable and patient connected to nasal cannula oxygen Cardiovascular status: stable and blood pressure returned to baseline Postop Assessment: no apparent nausea or vomiting Anesthetic complications: no   No notable events documented.  Last Vitals:  Vitals:   12/04/23 1744 12/04/23 2057  BP: (!) 158/100 (!) 153/100  Pulse: 85 86  Resp:  18  Temp:  37.2 C  SpO2:  100%    Last Pain:  Vitals:   12/04/23 2057  TempSrc: Oral  PainSc:                  Rolene Andrades S

## 2023-12-04 NOTE — Transfer of Care (Signed)
 Immediate Anesthesia Transfer of Care Note  Patient: Barry Nichols  Procedure(s) Performed: ENTEROSCOPY BIOPSY, GI  Patient Location: PACU  Anesthesia Type:MAC  Level of Consciousness: awake, alert , and oriented  Airway & Oxygen Therapy: Patient Spontanous Breathing and Patient connected to face mask oxygen  Post-op Assessment: Report given to RN and Post -op Vital signs reviewed and stable  Post vital signs: Reviewed and stable  Last Vitals:  Vitals Value Taken Time  BP 153/97 12/04/23 10:00  Temp 37 C 12/04/23 09:55  Pulse 97 12/04/23 10:06  Resp 21 12/04/23 10:06  SpO2 100 % 12/04/23 10:06  Vitals shown include unfiled device data.  Last Pain:  Vitals:   12/04/23 0955  TempSrc:   PainSc: 0-No pain         Complications: No notable events documented.

## 2023-12-04 NOTE — Progress Notes (Signed)
 [facility] PROGRESS NOTE    Progress Note  Barry Nichols  FMW:[id number] DOB: Apr 02, 1976 DOA: 12/02/2023 PCP: [name], PA-C     Brief Narrative:   Barry Nichols is an 48 y.o. male past medical history of end-stage renal disease on hemodialysis Tuesday Thursdays and Saturdays, failed renal transplant, diabetes mellitus type 2, chronic anemia recently discharged for blood loss anemia in July 2024, for which she had an EGD that showed esophageal ulcer, admitted again in July with hematuria for which urology recommended to follow-up with them as an outpatient in 1 month.  Went to dialysis and was found to have a low hemoglobin and was sent to the ED.  Patient relates blood in stools.  In the ER was found to have a hemoglobin of 6, MRI of the brain was negative as he was lethargic.  Assessment/Plan:   Acute blood loss anemia/melanotic stools/ Symptomatic anemia Last hemoglobin on 11/25/2023 was 7.8, here on admission 6.5. He is complaining of melanotic stools.  GI was consulted as he was complaining of black tarry stools, cont. IV Protonix . Hemoglobin this morning is 8.1, after 2 units of packed red blood cells. GI was consulted recommended Protonix  and push enteroscopy today. Consult urology for hematuria.  Acute metabolic encephalopathy/lethargy: He answers questions appropriately and moving all 4 extremities. MRI of the brain showed no acute findings. He relates he has not slept well in 2 days.   Ammonia level and TSH are pending.  Gross hematuria: For which she will follow-up with urology as an outpatient. Urology has been consulted  ESRD on dialysis [facility]) renal transplant: Usually dialyzes Tuesday Thursdays and Saturdays. Renal to dialyze today will transfuse during dialysis. Continue tacrolimus  and prednisone  per nephrology.  Diabetes mellitus type 2: With last hemoglobin A1c of 9.3. Keep him n.p.o. decrease his long-acting insulin  continue CBGs Q4  and sliding scale.  BPH:  Continue Flomax.  Essential hypertension/sinus tachycardia: Continue Toprol .   DVT prophylaxis: SCD's Family Communication:none Status is: Observation The patient will require care spanning > 2 midnights and should be moved to inpatient because: Hematuria and acute GI bleed    Code Status:     Code Status Orders  (From admission, onward)           Start     Ordered   12/03/23 0048  Full code  Continuous       Question:  By:  Answer:  Consent: discussion documented in EHR   12/03/23 0049           Code Status History     Date Active Date Inactive Code Status Order ID Comments User Context   11/23/2023 2214 11/25/2023 1657 Full Code [id number]  Lee Kingfisher, MD ED   11/10/2023 1121 11/11/2023 2236 Full Code [id number]  Alto Isaiah CROME, NP Inpatient   05/13/2023 0247 05/18/2023 1817 Full Code [id number]  Franky Redia SAILOR, MD Inpatient   08/25/2016 1419 08/27/2016 2004 DNR [id number]  Donnald Delon DASEN, MD ED   08/25/2016 1402 08/25/2016 1419 Full Code [id number]  Donnald Delon DASEN, MD ED         IV Access:   Peripheral IV   Procedures and diagnostic studies:   MR BRAIN WO CONTRAST Result Date: 12/03/2023 CLINICAL DATA:  48 year old male dialysis patient. Anemia requiring transfusion. Suspected GI bleeding. Altered mental status. EXAM: MRI HEAD WITHOUT CONTRAST TECHNIQUE: Multiplanar, multiecho pulse sequences of the brain and surrounding structures were obtained without intravenous contrast. COMPARISON:  None Available. FINDINGS: Brain:  No restricted diffusion to suggest acute infarction. No midline shift, mass effect, evidence of mass lesion, ventriculomegaly, extra-axial collection or acute intracranial hemorrhage. Cervicomedullary junction and pituitary are within normal limits. Largely normal for age gray and white matter signal. There are scattered small subcortical white matter T2 and FLAIR hyperintense foci (such as on series 11, image 36)  which may be mildly advanced for age but are in a nonspecific configuration. No cortical encephalomalacia. No chronic cerebral blood products on SWI. Deep gray nuclei, brainstem, and cerebellum appear negative. Vascular: Major intracranial vascular flow voids are preserved, distal right vertebral artery appears to be dominant, normal variant. Skull and upper cervical spine: Abnormally decreased background T1 marrow signal in the skull base and visible spine. This could be red marrow reactivation or sequelae of chronic kidney disease. Otherwise negative visible cervical spine. Sinuses/Orbits: Negative orbits. Small left maxillary sinus retention cyst. Other: Mastoids are clear. Grossly normal visible internal auditory structures. Negative visible scalp and face. IMPRESSION: 1. No acute intracranial abnormality. 2. Decreased T1 bone marrow signal which is probably related to red marrow reactivation and/or chronic renal disease in this setting. Electronically Signed   By: VEAR Hurst M.D.   On: 12/03/2023 04:27     Medical Consultants:   None.   Subjective:    Barry Nichols no complaints  Objective:    Vitals:   12/03/23 1708 12/03/23 2022 12/04/23 0431 12/04/23 0839  BP: 133/87 (!) 154/98 (!) 165/96 (!) 152/89  Pulse: 92 88 90 92  Resp:    17  Temp:  98.6 F (37 C) 98.4 F (36.9 C) 98.3 F (36.8 C)  TempSrc:  Oral Oral   SpO2:  99% 95% 99%  Weight:      Height:       SpO2: 99 %   Intake/Output Summary (Last 24 hours) at 12/04/2023 0858 Last data filed at 12/04/2023 0500 Gross per 24 hour  Intake 700.53 ml  Output 2050 ml  Net -1349.47 ml   Filed Weights   12/02/23 1909 12/03/23 0756 12/03/23 1159  Weight: 97.5 kg 98 kg 99.8 kg    Exam: General exam: In no acute distress. Respiratory system: Good air movement and clear to auscultation. Cardiovascular system: S1 & S2 heard, RRR. No JVD. Gastrointestinal system: Abdomen is nondistended, soft and nontender.  Extremities:  No pedal edema. Skin: No rashes, lesions or ulcers Psychiatry: Judgement and insight appear normal. Mood & affect appropriate. Data Reviewed:    Labs: Basic Metabolic Panel: Recent Labs  Lab 12/01/23 1344 12/02/23 1428 12/02/23 2020  NA 136 136 133*  K 4.1 4.3 4.6  CL 92* 91*  --   CO2 24 23  --   GLUCOSE 238* 174*  --   BUN 26* 52*  --   CREATININE 6.75* 10.44*  --   CALCIUM 8.8* 8.9  --    GFR Estimated Creatinine Clearance: 10.4 mL/min (A) (by C-G formula based on SCr of 10.44 mg/dL (H)). Liver Function Tests: Recent Labs  Lab 12/01/23 1344 12/02/23 1428  AST 53* 52*  ALT 62* 61*  ALKPHOS 91 84  BILITOT 0.7 0.7  PROT 7.5 7.0  ALBUMIN 1.8* 1.8*   No results for input(s): LIPASE, AMYLASE in the last 168 hours. Recent Labs  Lab 12/03/23 2114  AMMONIA 33   Coagulation profile Recent Labs  Lab 12/01/23 1344  INR 1.4*   COVID-19 Labs  No results for input(s): DDIMER, FERRITIN, LDH, CRP in the last 72 hours.  Lab  Results  Component Value Date   SARSCOV2NAA NEGATIVE 11/04/2023   SARSCOV2NAA NEGATIVE 05/12/2023    CBC: Recent Labs  Lab 12/01/23 1344 12/02/23 1428 12/02/23 2020 12/03/23 2114  WBC 7.0 7.2  --  7.4  NEUTROABS  --   --   --  5.3  HGB 7.2* 6.6* 6.5* 8.1*  HCT 23.3* 21.5* 19.0* 25.4*  MCV 86.9 86.0  --  85.2  PLT 420* 408*  --  359   Cardiac Enzymes: No results for input(s): CKTOTAL, CKMB, CKMBINDEX, TROPONINI in the last 168 hours. BNP (last 3 results) No results for input(s): PROBNP in the last 8760 hours. CBG: Recent Labs  Lab 12/03/23 1653 12/03/23 2014 12/03/23 2330 12/04/23 0427 12/04/23 0818  GLUCAP 122* 156* 102* 82 79   D-Dimer: No results for input(s): DDIMER in the last 72 hours. Hgb A1c: No results for input(s): HGBA1C in the last 72 hours. Lipid Profile: No results for input(s): CHOL, HDL, LDLCALC, TRIG, CHOLHDL, LDLDIRECT in the last 72 hours. Thyroid function  studies: No results for input(s): TSH, T4TOTAL, T3FREE, THYROIDAB in the last 72 hours.  Invalid input(s): FREET3 Anemia work up: No results for input(s): VITAMINB12, FOLATE, FERRITIN, TIBC, IRON , RETICCTPCT in the last 72 hours. Sepsis Labs: Recent Labs  Lab 12/01/23 1344 12/02/23 1428 12/03/23 2114  WBC 7.0 7.2 7.4   Microbiology No results found for this or any previous visit (from the past 240 hours).    Medications:    Chlorhexidine  Gluconate Cloth  6 each Topical Q0600   cinacalcet   30 mg Oral Daily   dorzolamide -timolol   1 drop Both Eyes BID   insulin  aspart  0-6 Units Subcutaneous Q4H   insulin  glargine-yfgn  10 Units Subcutaneous Daily   metoprolol  succinate  25 mg Oral QPM   OLANZapine   5 mg Oral Daily   pantoprazole  (PROTONIX ) IV  40 mg Intravenous Q12H   predniSONE   5 mg Oral Daily   sulfamethoxazole -trimethoprim   1 tablet Oral Q M,W,F   tacrolimus   3 mg Oral q morning   And   tacrolimus   2 mg Oral QHS   Continuous Infusions:  sodium chloride  20 mL/hr at 12/03/23 1514      LOS: 1 day   Barry Nichols  [facility]  12/04/2023, 8:58 AM

## 2023-12-04 NOTE — Progress Notes (Signed)
 [facility] Progress Note   Subjective:   Patient seen and examined at bedside. Feeling ok.  Biggest complaint is pain in wounds on LLE.  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  He is hoping he can go home soon but he has to give a urine specimen for culture.  Reports he urinated right before the Urologist came in hoping he would not require a catheter.  Now he doubts he will be able to urinate again today.  Usually has about daily.    Completed small bowel endoscopy this AM with no source of bleeding identified. Biopsies taken from nodular mucosa in duodenum.    Objective Vitals:   12/04/23 0955 12/04/23 1000 12/04/23 1010 12/04/23 1015  BP: (!) 157/93 (!) 153/97 (!) 151/90 (!) 151/90  Pulse: 96 94 91 86  Resp: (!) 22 (!) 24 19 (!) 21  Temp: 98.6 F (37 C)   98.6 F (37 C)  TempSrc:      SpO2: 100% 100% 99% 98%  Weight:      Height:       Physical Exam General:WDWN male in NAD Heart:RRR, no mrg Lungs:CTAB, nml WOB on RA Abdomen:soft, NTND Extremities:no LE edema, 2 wounds on LLE - posterior calf with 1.5cm dark eschar w/surrounding erythema, anterior calf with 1.5cm appears to have scab removed w/surrounding erythema Dialysis Access: LU AVF +b/t   Filed Weights   12/02/23 1909 12/03/23 0756 12/03/23 1159  Weight: 97.5 kg 98 kg 99.8 kg    Intake/Output Summary (Last 24 hours) at 12/04/2023 1442 Last data filed at 12/04/2023 1216 Gross per 24 hour  Intake 1240.53 ml  Output 50 ml  Net 1190.53 ml    Additional Objective Labs: Basic Metabolic Panel: Recent Labs  Lab 12/01/23 1344 12/02/23 1428 12/02/23 2020 12/04/23 0913  NA 136 136 133* 133*  K 4.1 4.3 4.6 4.3  CL 92* 91*  --  94*  CO2 24 23  --   --   GLUCOSE 238* 174*  --  90  BUN 26* 52*  --  37*  CREATININE 6.75* 10.44*  --  9.70*  CALCIUM 8.8* 8.9  --   --    Liver Function Tests: Recent Labs  Lab 12/01/23 1344 12/02/23 1428  AST 53* 52*  ALT 62* 61*  ALKPHOS 91 84  BILITOT 0.7  0.7  PROT 7.5 7.0  ALBUMIN 1.8* 1.8*   CBC: Recent Labs  Lab 12/01/23 1344 12/02/23 1428 12/02/23 2020 12/03/23 2114 12/04/23 0913  WBC 7.0 7.2  --  7.4  --   NEUTROABS  --   --   --  5.3  --   HGB 7.2* 6.6* 6.5* 8.1* 8.8*  HCT 23.3* 21.5* 19.0* 25.4* 26.0*  MCV 86.9 86.0  --  85.2  --   PLT 420* 408*  --  359  --    Blood Culture    Component Value Date/Time   SDES URINE, CLEAN CATCH 11/23/2023 1949   SPECREQUEST NONE 11/23/2023 1949   CULT  11/23/2023 1949    NO GROWTH Performed at [facility], [address], [place], [place] [address]    REPTSTATUS 11/25/2023 FINAL 11/23/2023 1949    CBG: Recent Labs  Lab 12/03/23 2330 12/04/23 0427 12/04/23 0818 12/04/23 0956 12/04/23 1215  GLUCAP 102* 82 79 85 90   Studies/Results: MR BRAIN WO CONTRAST Result Date: 12/03/2023 CLINICAL DATA:  48 year old male dialysis patient. Anemia requiring transfusion. Suspected GI bleeding. Altered mental status. EXAM: MRI HEAD  WITHOUT CONTRAST TECHNIQUE: Multiplanar, multiecho pulse sequences of the brain and surrounding structures were obtained without intravenous contrast. COMPARISON:  None Available. FINDINGS: Brain: No restricted diffusion to suggest acute infarction. No midline shift, mass effect, evidence of mass lesion, ventriculomegaly, extra-axial collection or acute intracranial hemorrhage. Cervicomedullary junction and pituitary are within normal limits. Largely normal for age gray and white matter signal. There are scattered small subcortical white matter T2 and FLAIR hyperintense foci (such as on series 11, image 36) which may be mildly advanced for age but are in a nonspecific configuration. No cortical encephalomalacia. No chronic cerebral blood products on SWI. Deep gray nuclei, brainstem, and cerebellum appear negative. Vascular: Major intracranial vascular flow voids are preserved, distal right vertebral artery appears to be dominant, normal variant. Skull and upper  cervical spine: Abnormally decreased background T1 marrow signal in the skull base and visible spine. This could be red marrow reactivation or sequelae of chronic kidney disease. Otherwise negative visible cervical spine. Sinuses/Orbits: Negative orbits. Small left maxillary sinus retention cyst. Other: Mastoids are clear. Grossly normal visible internal auditory structures. Negative visible scalp and face. IMPRESSION: 1. No acute intracranial abnormality. 2. Decreased T1 bone marrow signal which is probably related to red marrow reactivation and/or chronic renal disease in this setting. Electronically Signed   By: VEAR Hurst M.D.   On: 12/03/2023 04:27    Medications:  sodium chloride  irrigation      Chlorhexidine  Gluconate Cloth  6 each Topical Q0600   cinacalcet   30 mg Oral Daily   dorzolamide -timolol   1 drop Both Eyes BID   insulin  aspart  0-6 Units Subcutaneous Q4H   insulin  glargine-yfgn  10 Units Subcutaneous Daily   metoprolol  succinate  25 mg Oral QPM   OLANZapine   5 mg Oral Daily   pantoprazole  (PROTONIX ) IV  40 mg Intravenous Q12H   predniSONE   5 mg Oral Daily   sulfamethoxazole -trimethoprim   1 tablet Oral Q M,W,F   tacrolimus   3 mg Oral q morning   And   tacrolimus   2 mg Oral QHS    Dialysis Orders: [facility]  4h  B400  96.8kg   2K bath  AVF  Heparin  2000 Last HD 8/04, post wt 97.3kg WG 1.5- 4.5kg, getting to dry wt the last 2 wks Mircera 225 q 2 wks, last 7/25, due 8/8 Hectorol 6 mcg IV mwf - stop Last 3 Hb: 7/21= 8.2, 7/28= 7.6, 8/04 = 6.2, 8/14 6.5     Assessment/ Plan: Symptomatic acute on chronic anemia: Hb 6.5 in ED, s/p 2units pRBC.  Hgb now 8.8. Completed small bowel endoscopy this AM with no source of bleeding identified. Biopsies taken from nodular mucosa in duodenum. GI signed off.  Anemia of esrd: Hgb 8.8. ESA not yet given. Will order if remains inpatient.   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  Very painful per pt. Advised to discuss pain  management with primary.  Discussed importance of keeping Ca, phos and pth well controlled.  Avoid Ca and Vit D products. Started on Na thiosulfate at outpatient HD on 8/13.  Stop hecterol, use low Ca bath.  ESRD: on HD MWF. Next HD on 12/06/23. HTN: BP's stable, cont toprol  xl every day.  Volume: does not appear volume overloaded on exam.  UF as tolerated. Discussed importance of fluid restrictions to avoid volume overload. Secondary hyperparathyroidism - No Vit D products.   Continue sensipar  and auryxia . Gross hematuria: Seen by urology. Suspect UTI. Requesting urine culture.  Can be completed as outpatient if discharge.  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

## 2023-12-04 NOTE — OR Nursing (Signed)
 EPIC Chat sent asking  if CMP labs have been drawn today. Collene JAYSON Edu [place]

## 2023-12-04 NOTE — Progress Notes (Signed)
 Put in a page to Dr. Devere (Urology) concerning order for CBI and 3 way catheter-if we need to do that or if he is coming to do that and if he thinks we need to do a coude or not.

## 2023-12-04 NOTE — Op Note (Signed)
 [facility] Patient Name: Barry Nichols Procedure Date : 12/04/2023 MRN: [id number] Attending MD: Lynnie Bring , MD, [id number] Date of Birth: Jul 13, 1975 CSN: [id number] Age: 48 Admit Type: Inpatient Procedure:                Small bowel enteroscopy Indications:              Recurrent melena. Hb 6.6 (adm) s/p 2U. EGD July 24                            with eso ulcers, LA Gd C esophagitis. Note that his                            baseline hemoglobin ranges from 7-8. Pt with ESRD                            on HD after failed renal transplant (rejection) on                            immunosuppressive meds. Colonic polyps on recent                            colonoscopy at [facility] 10/19/2023. Rpt 3 yrs d/t                            quality of prep. MASLD with mildly elevated                            AST/ALT. No cirrhosis on recent CT. Providers:                Lynnie Bring, MD, Collene Edu, RN, Haskel Chris,                            Technician Referring MD:              Medicines:                Monitored Anesthesia Care Complications:            No immediate complications. Estimated Blood Loss:     Estimated blood loss: none. Procedure:                Pre-Anesthesia Assessment:                           - Prior to the procedure, a History and Physical                            was performed, and patient medications and                            allergies were reviewed. The patient's tolerance of                            previous anesthesia was also reviewed. The risks  and benefits of the procedure and the sedation                            options and risks were discussed with the patient.                            All questions were answered, and informed consent                            was obtained. Prior Anticoagulants: The patient has                            taken no anticoagulant or antiplatelet agents. ASA                             Grade Assessment: III - A patient with severe                            systemic disease. After reviewing the risks and                            benefits, the patient was deemed in satisfactory                            condition to undergo the procedure.                           After obtaining informed consent, the endoscope was                            passed under direct vision. Throughout the                            procedure, the patient's blood pressure, pulse, and                            oxygen saturations were monitored continuously. The                            PCF-H190TL ([id number]) Olympus colonoscope was                            introduced through the mouth and advanced to the                            proximal jejunum (up to 160 mark). The small bowel                            enteroscopy was accomplished without difficulty.                            The patient tolerated the procedure well. Scope In: Scope Out: Findings:      The examined esophagus was normal. Distal esophageal ulcers  have healed.      A few localized small erosions with no bleeding and no stigmata of       recent bleeding were found in the gastric antrum.      Localized mildly nodular mucosa was found in the second portion of the       duodenum. ?importance. Biopsies were taken with a cold forceps for       histology.      The exam was otherwise normal. There was no evidence of significant       pathology at 160 cm (from the incisors). Impression:               - Normal esophagus.                           - Few gastric erosions with no bleeding and no                            stigmata of recent bleeding.                           - Nodular mucosa in the second portion of the                            duodenum. Biopsied.                           - The examined portion of the jejunum was normal.                            No bleeding. Recommendation:           - Return  patient to hospital ward for ongoing care.                           - Resume previous diet.                           - Await pathology results.                           - Continue Protonix  40 mg p.o. once a day                            indefinitely                           - Trend CBC                           - Recommend urology consultation for hematuria.                           - Avoid nonsteroidals.                           - If any further melena, would consider VCE. Not  this adm since biopsies may preclude reliable test.                           - GI will sign off for now                           - The findings and recommendations were discussed                            with the patient's family. Procedure Code(s):        --- Professional ---                           [id number], Small intestinal endoscopy, enteroscopy                            beyond second portion of duodenum, not including                            ileum; with biopsy, single or multiple Diagnosis Code(s):        --- Professional ---                           K31.89, Other diseases of stomach and duodenum                           K92.1, Melena (includes Hematochezia) CPT copyright 2022 American [employer] Association. All rights reserved. The codes documented in this report are preliminary and upon coder review may  be revised to meet current compliance requirements. Lynnie Bring, MD 12/04/2023 9:56:47 AM This report has been signed electronically. Number of Addenda: 0

## 2023-12-04 NOTE — Interval H&P Note (Signed)
 History and Physical Interval Note:  12/04/2023 9:01 AM  Barry Nichols  has presented today for surgery, with the diagnosis of Anemia/drop in Hgb and black stools; esophageal ulcers.  The various methods of treatment have been discussed with the patient and family. After consideration of risks, benefits and other options for treatment, the patient has consented to  Procedure(s): ENTEROSCOPY (N/A) as a surgical intervention.  The patient's history has been reviewed, patient examined, no change in status, stable for surgery.  I have reviewed the patient's chart and labs.  Questions were answered to the patient's satisfaction.     Lynnie Bring

## 2023-12-04 NOTE — Progress Notes (Signed)
 Also, pt has concerns about catheter and wants to talk to the Urologist about it prior to it being inserted.

## 2023-12-05 DIAGNOSIS — D649 Anemia, unspecified: Secondary | ICD-10-CM | POA: Diagnosis not present

## 2023-12-05 LAB — CBC
HCT: 24.5 % — ABNORMAL LOW (ref 39.0–52.0)
Hemoglobin: 7.9 g/dL — ABNORMAL LOW (ref 13.0–17.0)
MCH: 27.1 pg (ref 26.0–34.0)
MCHC: 32.2 g/dL (ref 30.0–36.0)
MCV: 84.2 fL (ref 80.0–100.0)
Platelets: 338 K/uL (ref 150–400)
RBC: 2.91 MIL/uL — ABNORMAL LOW (ref 4.22–5.81)
RDW: 16.1 % — ABNORMAL HIGH (ref 11.5–15.5)
WBC: 7.4 K/uL (ref 4.0–10.5)
nRBC: 0 % (ref 0.0–0.2)

## 2023-12-05 LAB — URINE CULTURE: Culture: NO GROWTH

## 2023-12-05 LAB — GLUCOSE, CAPILLARY
Glucose-Capillary: 71 mg/dL (ref 70–99)
Glucose-Capillary: 80 mg/dL (ref 70–99)
Glucose-Capillary: 90 mg/dL (ref 70–99)

## 2023-12-05 MED ORDER — SUCRALFATE 1 GM/10ML PO SUSP: 1.0000 g | Freq: Four times a day (QID) | ORAL | 0 refills | Status: DC

## 2023-12-05 MED ORDER — CARVEDILOL 12.5 MG PO TABS: 12.5000 mg | ORAL_TABLET | Freq: Two times a day (BID) | ORAL | Status: DC

## 2023-12-05 MED ORDER — GUAIFENESIN-DM 100-10 MG/5ML PO SYRP
5.0000 mL | ORAL_SOLUTION | ORAL | Status: DC | PRN
  Administered 2023-12-05: 5 mL via ORAL
  Filled 2023-12-05: qty 5

## 2023-12-05 MED ORDER — SODIUM THIOSULFATE 250 MG/ML IV SOLN
25.0000 g | Freq: Once | INTRAVENOUS | Status: DC
  Filled 2023-12-05: qty 100

## 2023-12-05 MED ORDER — HYDRALAZINE HCL 20 MG/ML IJ SOLN: 5.0000 mg | Freq: Three times a day (TID) | INTRAMUSCULAR | Status: DC | PRN

## 2023-12-05 MED ORDER — PANTOPRAZOLE SODIUM 40 MG PO TBEC: 40.0000 mg | DELAYED_RELEASE_TABLET | Freq: Two times a day (BID) | ORAL | 1 refills | Status: AC

## 2023-12-05 MED ORDER — CARVEDILOL 12.5 MG PO TABS: 12.5000 mg | ORAL_TABLET | Freq: Two times a day (BID) | ORAL | 1 refills | Status: AC

## 2023-12-05 NOTE — Progress Notes (Signed)
 [facility] Progress Note   Subjective:   Patient seen and examined in room.  Able to give urine sample overnight.  Biggest complaint is pain in L leg.  Missed Na thiosulfate dose on Friday, order dose to be given today.    Objective Vitals:   12/04/23 1744 12/04/23 2057 12/05/23 0422 12/05/23 0800  BP: (!) 158/100 (!) 153/100 (!) 176/104 (!) 189/113  Pulse: 85 86 81 92  Resp:  18 19 18   Temp:  98.9 F (37.2 C) 98.3 F (36.8 C) 97.9 F (36.6 C)  TempSrc:  Oral Oral   SpO2:  100% 100% 100%  Weight:      Height:       Physical Exam General:WDWN male in NAD Heart:RRR Lungs:nml WOB on RA Abdomen:soft, NTND Extremities:no LE edema, 2 tender wounds on LLE - posterior calf with 1.5cm dark eschar w/surrounding erythema, anterior calf with 1.5cm appears to have scab removed w/surrounding erythema  Dialysis Access: LU AVF +b/t   Filed Weights   12/02/23 1909 12/03/23 0756 12/03/23 1159  Weight: 97.5 kg 98 kg 99.8 kg    Intake/Output Summary (Last 24 hours) at 12/05/2023 1040 Last data filed at 12/04/2023 2230 Gross per 24 hour  Intake 290 ml  Output --  Net 290 ml    Additional Objective Labs: Basic Metabolic Panel: Recent Labs  Lab 12/01/23 1344 12/02/23 1428 12/02/23 2020 12/04/23 0913  NA 136 136 133* 133*  K 4.1 4.3 4.6 4.3  CL 92* 91*  --  94*  CO2 24 23  --   --   GLUCOSE 238* 174*  --  90  BUN 26* 52*  --  37*  CREATININE 6.75* 10.44*  --  9.70*  CALCIUM 8.8* 8.9  --   --    Liver Function Tests: Recent Labs  Lab 12/01/23 1344 12/02/23 1428  AST 53* 52*  ALT 62* 61*  ALKPHOS 91 84  BILITOT 0.7 0.7  PROT 7.5 7.0  ALBUMIN 1.8* 1.8*  CBC: Recent Labs  Lab 12/01/23 1344 12/02/23 1428 12/02/23 2020 12/03/23 2114 12/04/23 0913 12/05/23 1020  WBC 7.0 7.2  --  7.4  --  7.4  NEUTROABS  --   --   --  5.3  --   --   HGB 7.2* 6.6*   < > 8.1* 8.8* 7.9*  HCT 23.3* 21.5*   < > 25.4* 26.0* 24.5*  MCV 86.9 86.0  --  85.2  --  84.2  PLT  420* 408*  --  359  --  338   < > = values in this interval not displayed.    CBG: Recent Labs  Lab 12/04/23 1707 12/04/23 2019 12/05/23 0013 12/05/23 0420 12/05/23 0756  GLUCAP 104* 204* 90 80 71     Medications:  sodium chloride  irrigation     sodium thiosulfate  25 g in sodium chloride  0.9 % 200 mL Infusion for Calciphylaxis      carvedilol   12.5 mg Oral BID WC   Chlorhexidine  Gluconate Cloth  6 each Topical Q0600   cinacalcet   30 mg Oral Daily   dorzolamide -timolol   1 drop Both Eyes BID   ferric citrate   420 mg Oral TID WC   insulin  aspart  0-6 Units Subcutaneous Q4H   insulin  glargine-yfgn  10 Units Subcutaneous Daily   OLANZapine   5 mg Oral Daily   pantoprazole  (PROTONIX ) IV  40 mg Intravenous Q12H   predniSONE   5 mg Oral Daily   sulfamethoxazole -trimethoprim   1 tablet  Oral Q M,W,F   tacrolimus   3 mg Oral q morning   And   tacrolimus   2 mg Oral QHS    Dialysis Orders: [facility]  4h  B400  96.8kg   2K bath  AVF  Heparin  2000 Last HD 8/04, post wt 97.3kg WG 1.5- 4.5kg, getting to dry wt the last 2 wks Mircera 225 q 2 wks, last 7/25, due 8/8 Hectorol 6 mcg IV mwf - stop Last 3 Hb: 7/21= 8.2, 7/28= 7.6, 8/04 = 6.2, 8/14 6.5     Assessment/ Plan: Symptomatic acute on chronic anemia: Hb 6.5 in ED, s/p 2units pRBC.  Hgb now 7.9. Only bleeding he has noticed is in urine.  Completed small bowel endoscopy this AM with no source of bleeding identified. Biopsies taken from nodular mucosa in duodenum. GI signed off.  Anemia of esrd: Hgb 7.9. ESA not yet given. Will order if remains inpatient.   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  Very painful disease, will need pain management. Advised to discuss with primary or can refer to pain clinic.  Discussed importance of keeping Ca, phos and pth well controlled.  Avoid Ca and Vit D products. Stop hecterol, use low Ca bath. Started on Na thiosulfate at outpatient HD on 8/13 - did not receive dose on Friday with  HD, will order dose for today to be given prior to d/c.  Nurse notified.   ESRD: on HD MWF. Next HD on 12/06/23. HTN: BP's stable, cont toprol  xl every day.  Volume: does not appear volume overloaded on exam.  UF as tolerated. Discussed importance of fluid restrictions to avoid volume overload. Secondary hyperparathyroidism - No Vit D products.   Continue sensipar  and auryxia . Gross hematuria: Seen by urology. Suspect UTI. Urine culture pending. 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

## 2023-12-05 NOTE — Progress Notes (Signed)
 Removed IV-CDI. Reviewed d/c paperwork with patient. Wheeled stable patient to main entrance.

## 2023-12-05 NOTE — Discharge Planning (Cosign Needed Addendum)
 [facility] Patient Discharge Orders- [facility] CLINIC: [place]  Patient's name: Barry Nichols Admit/DC Dates: 12/02/2023 - 12/05/23   Discharge Diagnoses: Acute on chronic anemia - no source of bleeding noted on endoscopy. Hematuria - Suspected UTI. Urine collected, culture pending.   Calciphylaxis - Sodium thiosulfate  25g IV qHD  HD ORDER CHANGES: Heparin  change: no EDW Change: no Bath Change: no   ANEMIA MANAGEMENT: Aranesp : Given: no    PRBC's Given: yes Date/# of units: 2 units on 8/15 ESA dose for discharge: mircera 225 mcg IV q 2 weeks - next 12/06/23 IV Iron  dose at discharge: NONE    BONE/MINERAL MEDICATIONS: Hectorol/Calcitriol  change: NO VDRA due to calciphylaxis Sensipar /Parsabiv change: no   ACCESS INTERVENTION/CHANGE: no Details:   RECENT LABS: Recent Labs  Lab 12/02/23 1428 12/02/23 2020 12/04/23 0913  K 4.3   < > 4.3  CALCIUM 8.9  --   --   ALBUMIN 1.8*  --   --    < > = values in this interval not displayed.   Recent Labs  Lab 12/05/23 1020  HGB 7.9*   Blood Culture    Component Value Date/Time   SDES URINE, CLEAN CATCH 11/23/2023 1949   SPECREQUEST NONE 11/23/2023 1949   CULT  11/23/2023 1949    NO GROWTH Performed at [facility], [address], [place], [place] [address]    REPTSTATUS 11/25/2023 FINAL 11/23/2023 1949       IV ANTIBIOTICS: none Details:   OTHER ANTICOAGULATION:  On Eliquis: no On Coumadin: no   OTHER/APPTS/LAB ORDERS: Urology follow up in early Sept.  Follow up on Urine culture collected in hospital - results pending on discharge.      D/C Meds to be reconciled by nurse after every discharge.  Completed By: Manuelita Labella PA-C   Reviewed by: MD:______ RN_______

## 2023-12-05 NOTE — Discharge Summary (Signed)
 Physician Discharge Summary  Barry Nichols FMW:[id number] DOB: 1975-09-11 DOA: 12/02/2023  PCP: Barry Tari DASEN, PA-C  Admit date: 12/02/2023 Discharge date: 12/05/2023  Admitted From: Home Disposition:  Home  Recommendations for Outpatient Follow-up:  Follow up with PCP in 1-2 weeks Please obtain BMP/CBC in one week   Home Health:No Equipment/Devices:None  Discharge Condition:Stable CODE STATUS:Full Diet recommendation: Heart Healthy  Brief/Interim Summary: 48 y.o. male past medical history of end-stage renal disease on hemodialysis Tuesday Thursdays and Saturdays, failed renal transplant, diabetes mellitus type 2, chronic anemia recently discharged for blood loss anemia in July 2024, for which she had an EGD that showed esophageal ulcer, admitted again in July with hematuria for which urology recommended to follow-up with them as an outpatient in 1 month.  Went to dialysis and was found to have a low hemoglobin and was sent to the ED.  Patient relates blood in stools.  In the ER was found to have a hemoglobin of 6, MRI of the brain was negative as he was lethargic.   Discharge Diagnoses:  Principal Problem:   Symptomatic anemia Active Problems:   ESRD on dialysis (HCC)   Hypertension   DM (diabetes mellitus), type 2 with renal complications (HCC)   Renal transplant recipient   End-stage renal disease on hemodialysis (HCC)   Hematuria   Lethargy   GI bleed  Acute blood loss anemia/melanotic stools/symptomatic anemia: Last hemoglobin on 11/25/2023 was 7.8, on admission was 6.5. He was complaining of melanotic stools. GI was consulted perform an EGD that showed no active bleeding but a few gastric erosions with nodular mucosa biopsies were taken. He will continue Protonix  twice a day and sucralfate . He status post 2 units of Blood cells, his hemoglobin came up to 8.8.  Acute metabolic encephalopathy: MRI of the brain showed no acute findings. Ammonia level and sh were  unremarkable. This resolved of unclear etiology.  Gross hematuria: Urology was consulted, CT scan stone showed no acute abnormalities.  UA was done that showed 21-50 white blood cells and rare bacteria. Urine cultures unremarkable. Urology recommended to follow-up with them as an outpatient for cystoscopy.  End-stage renal disease on hemodialysis: Renal was consulted he was continued on his regular dialysis days.  Diabetes mellitus type 2: Last A1c of 9.3. No changes made to his medication.  BPH: Continue Flomax.  Essential hypertension/anxiety regards: Continue Coreg .   Discharge Instructions  Discharge Instructions     Diet - low sodium heart healthy   Complete by: As directed    Increase activity slowly   Complete by: As directed    No wound care   Complete by: As directed       Allergies as of 12/05/2023   No Known Allergies      Medication List     STOP taking these medications    metoprolol  succinate 25 MG 24 hr tablet Commonly known as: TOPROL -XL   sucralfate  1 GM/10ML suspension Commonly known as: CARAFATE        TAKE these medications    carvedilol  12.5 MG tablet Commonly known as: COREG  Take 1 tablet (12.5 mg total) by mouth 2 (two) times daily with a meal.   cinacalcet  30 MG tablet Commonly known as: SENSIPAR  Take 30 mg by mouth daily.   CORICIDIN HBP PO Take 2 capsules by mouth as needed. Gel Capsules   Darbepoetin Alfa  200 MCG/0.4ML Sosy injection Commonly known as: ARANESP  Inject 0.4 mLs (200 mcg total) into the skin every Wednesday at 6 PM.  dorzolamide -timolol  2-0.5 % ophthalmic solution Commonly known as: COSOPT  Place 1 drop into both eyes 2 (two) times daily.   HumaLOG KwikPen 200 UNIT/ML KwikPen Generic drug: insulin  lispro Inject 25-40 Units into the skin 3 (three) times daily as needed (high blood sugar).   icosapent  Ethyl 1 g capsule Commonly known as: VASCEPA  Take 2 g by mouth 2 (two) times daily.   insulin   degludec 200 UNIT/ML FlexTouch Pen Commonly known as: TRESIBA  Inject 30 Units into the skin at bedtime as needed.   NYQUIL PO Take 1 Dose by mouth as needed (cough). Liquid Nyquid   pantoprazole  40 MG tablet Commonly known as: PROTONIX  Take 1 tablet (40 mg total) by mouth 2 (two) times daily before a meal. Take twice daily x 6 weeks, then switch to once daily.   predniSONE  5 MG tablet Commonly known as: DELTASONE  Take 5 mg by mouth daily.   sulfamethoxazole -trimethoprim  400-80 MG tablet Commonly known as: BACTRIM  Take 1 tablet by mouth every Monday, Wednesday, and Friday.   tacrolimus  1 MG capsule Commonly known as: PROGRAF  Take 2-3 mg by mouth See admin instructions. Take 3 capsules (3mg ) in the morning and then take 2 capsules (2mg ) at bedtime.   tadalafil 20 MG tablet Commonly known as: CIALIS Take 20 mg by mouth daily as needed for erectile dysfunction.   traZODone  50 MG tablet Commonly known as: DESYREL  Take 50 mg by mouth at bedtime.   Vitamin D (Ergocalciferol) 1.25 MG (50000 UNIT) Caps capsule Commonly known as: DRISDOL Take 50,000 Units by mouth every 7 (seven) days.        No Known Allergies  Consultations: Gastroenterology Urology   Procedures/Studies: MR BRAIN WO CONTRAST Result Date: 12/03/2023 CLINICAL DATA:  48 year old male dialysis patient. Anemia requiring transfusion. Suspected GI bleeding. Altered mental status. EXAM: MRI HEAD WITHOUT CONTRAST TECHNIQUE: Multiplanar, multiecho pulse sequences of the brain and surrounding structures were obtained without intravenous contrast. COMPARISON:  None Available. FINDINGS: Brain: No restricted diffusion to suggest acute infarction. No midline shift, mass effect, evidence of mass lesion, ventriculomegaly, extra-axial collection or acute intracranial hemorrhage. Cervicomedullary junction and pituitary are within normal limits. Largely normal for age gray and white matter signal. There are scattered small  subcortical white matter T2 and FLAIR hyperintense foci (such as on series 11, image 36) which may be mildly advanced for age but are in a nonspecific configuration. No cortical encephalomalacia. No chronic cerebral blood products on SWI. Deep gray nuclei, brainstem, and cerebellum appear negative. Vascular: Major intracranial vascular flow voids are preserved, distal right vertebral artery appears to be dominant, normal variant. Skull and upper cervical spine: Abnormally decreased background T1 marrow signal in the skull base and visible spine. This could be red marrow reactivation or sequelae of chronic kidney disease. Otherwise negative visible cervical spine. Sinuses/Orbits: Negative orbits. Small left maxillary sinus retention cyst. Other: Mastoids are clear. Grossly normal visible internal auditory structures. Negative visible scalp and face. IMPRESSION: 1. No acute intracranial abnormality. 2. Decreased T1 bone marrow signal which is probably related to red marrow reactivation and/or chronic renal disease in this setting. Electronically Signed   By: VEAR Hurst M.D.   On: 12/03/2023 04:27   CT Renal Stone Study Result Date: 11/23/2023 CLINICAL DATA:  Abdominal/flank pain.  Concern for kidney stone. EXAM: CT ABDOMEN AND PELVIS WITHOUT CONTRAST TECHNIQUE: Multidetector CT imaging of the abdomen and pelvis was performed following the standard protocol without IV contrast. RADIATION DOSE REDUCTION: This exam was performed according to the departmental  dose-optimization program which includes automated exposure control, adjustment of the mA and/or kV according to patient size and/or use of iterative reconstruction technique. COMPARISON:  CT abdomen pelvis dated 11/04/2023. FINDINGS: Evaluation of this exam is limited in the absence of intravenous contrast. Lower chest: The visualized lung bases are clear. No intra-abdominal free air or free fluid. Hepatobiliary: The liver is unremarkable. No biliary dilatation. The  gallbladder is unremarkable Pancreas: Unremarkable. No pancreatic ductal dilatation or surrounding inflammatory changes. Spleen: Normal in size without focal abnormality. Adrenals/Urinary Tract: The adrenal glands unremarkable. Atrophic native kidneys. Several small renal cysts noted. There is no hydronephrosis on either side. 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 The urinary bladder is collapsed. Stomach/Bowel: There is mild sigmoid diverticulosis. There is no bowel obstruction or active inflammation. The appendix is normal. Vascular/Lymphatic: Mild aortoiliac atherosclerotic disease. The IVC is unremarkable. No portal venous gas. There is no adenopathy. Reproductive: The prostate and seminal vesicles are grossly unremarkable. No pelvic mass. 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 No acute osseous pathology. IMPRESSION: 1. No acute intra-abdominal or pelvic pathology. 2. 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 3. Mild sigmoid diverticulosis. No bowel obstruction. Normal appendix. 4.  Aortic Atherosclerosis (ICD10-I70.0). Electronically Signed   By: Vanetta Chou M.D.   On: 11/23/2023 21:33   CT CHEST WO CONTRAST Result Date: 11/10/2023 CLINICAL DATA:  Persistent cough. EXAM: CT CHEST WITHOUT CONTRAST TECHNIQUE: Multidetector CT imaging of the chest was performed following the standard protocol without IV contrast. RADIATION DOSE REDUCTION: This exam was performed according to the departmental dose-optimization program which includes automated exposure control, adjustment of the mA and/or kV according to patient size and/or use of iterative reconstruction technique. COMPARISON:  Chest radiograph dated 11/09/2023. FINDINGS: Evaluation of this exam is limited in the absence of intravenous contrast. Cardiovascular: There is  no cardiomegaly or pericardial effusion. There is coronary vascular calcification. Mild atherosclerotic calcification of the thoracic aorta. No intimal dilatation. The central pulmonary arteries are grossly unremarkable. Mediastinum/Nodes: No hilar or mediastinal adenopathy. The esophagus is grossly unremarkable. A 1.3 cm left thyroid calcified nodule. No mediastinal fluid collection. Lungs/Pleura: No focal consolidation, pleural effusion, pneumothorax. The central airways are patent. Upper Abdomen: No acute abnormality. Musculoskeletal: No acute osseous pathology. IMPRESSION: 1. No acute intrathoracic pathology. 2. Coronary vascular calcification. 3.  Aortic Atherosclerosis (ICD10-I70.0). Electronically Signed   By: Vanetta Chou M.D.   On: 11/10/2023 17:22   ECHOCARDIOGRAM COMPLETE Result Date: 11/10/2023    ECHOCARDIOGRAM REPORT   Patient Name:   DONATELLO KLEVE Date of Exam: 11/10/2023 Medical Rec #:  [id number]           Height:       70.0 in Accession #:    [id number]          Weight:       217.4 lb Date of Birth:  Feb 25, 1976           BSA:          2.163 m Patient Age:    48 years            BP:           132/89 mmHg Patient Gender: M                   HR:  105 bpm. Exam Location:  Inpatient Procedure: 2D Echo, Cardiac Doppler and Color Doppler (Both Spectral and Color            Flow Doppler were utilized during procedure). Indications:    Elevated Troponins  History:        Patient has no prior history of Echocardiogram examinations.                 ESRD; Risk Factors:Hypertension and Diabetes.  Sonographer:    Damien Senior RDCS Referring Phys: SUBRINA SUNDIL IMPRESSIONS  1. There is mid cavity acceleration of up to , unable to augment with Valsalva. Left ventricular ejection fraction, by estimation, is 65 to 70%. The left ventricle has normal function. The left ventricle has no regional wall motion abnormalities. There is mild concentric left ventricular hypertrophy. Left ventricular  diastolic parameters are consistent with Grade I diastolic dysfunction (impaired relaxation).  2. Right ventricular systolic function is normal. The right ventricular size is normal. Tricuspid regurgitation signal is inadequate for assessing PA pressure.  3. The mitral valve is normal in structure. Trivial mitral valve regurgitation.  4. The aortic valve has an indeterminant number of cusps. Aortic valve regurgitation is trivial.  5. The inferior vena cava is normal in size with greater than 50% respiratory variability, suggesting right atrial pressure of 3 mmHg. FINDINGS  Left Ventricle: There is mid cavity acceleration of up to , unable to augment with Valsalva. Left ventricular ejection fraction, by estimation, is 65 to 70%. The left ventricle has normal function. The left ventricle has no regional wall motion abnormalities. The left ventricular internal cavity size was normal in size. There is mild concentric left ventricular hypertrophy. Left ventricular diastolic parameters are consistent with Grade I diastolic dysfunction (impaired relaxation). Right Ventricle: The right ventricular size is normal. No increase in right ventricular wall thickness. Right ventricular systolic function is normal. Tricuspid regurgitation signal is inadequate for assessing PA pressure. Left Atrium: Left atrial size was normal in size. Right Atrium: Right atrial size was normal in size. Pericardium: There is no evidence of pericardial effusion. Mitral Valve: The mitral valve is normal in structure. Trivial mitral valve regurgitation. Tricuspid Valve: The tricuspid valve is normal in structure. Tricuspid valve regurgitation is trivial. Aortic Valve: The aortic valve has an indeterminant number of cusps. Aortic valve regurgitation is trivial. Pulmonic Valve: The pulmonic valve was normal in structure. Pulmonic valve regurgitation is trivial. Aorta: The aortic root and ascending aorta are structurally normal, with no evidence of  dilitation. Venous: The inferior vena cava is normal in size with greater than 50% respiratory variability, suggesting right atrial pressure of 3 mmHg. IAS/Shunts: No atrial level shunt detected by color flow Doppler.  LEFT VENTRICLE PLAX 2D LVIDd:         4.60 cm   Diastology LVIDs:         3.20 cm   LV e' medial:    5.77 cm/s LV PW:         1.30 cm   LV E/e' medial:  10.8 LV IVS:        1.30 cm   LV e' lateral:   10.40 cm/s LVOT diam:     2.30 cm   LV E/e' lateral: 6.0 LV SV:         67 LV SV Index:   31 LVOT Area:     4.15 cm  RIGHT VENTRICLE RV S prime:     22.60 cm/s TAPSE (M-mode): 2.3 cm LEFT ATRIUM  Index        RIGHT ATRIUM           Index LA diam:        3.50 cm 1.62 cm/m   RA Area:     12.00 cm LA Vol (A2C):   38.9 ml 17.99 ml/m  RA Volume:   24.10 ml  11.14 ml/m LA Vol (A4C):   31.4 ml 14.52 ml/m LA Biplane Vol: 37.2 ml 17.20 ml/m  AORTIC VALVE LVOT Vmax:   112.00 cm/s LVOT Vmean:  85.500 cm/s LVOT VTI:    0.162 m  AORTA Ao Root diam: 3.40 cm Ao Asc diam:  3.30 cm MITRAL VALVE MV Area (PHT): 3.76 cm    SHUNTS MV Decel Time: 202 msec    Systemic VTI:  0.16 m MV E velocity: 62.30 cm/s  Systemic Diam: 2.30 cm MV A velocity: 87.60 cm/s MV E/A ratio:  0.71 Morene Brownie Electronically signed by Morene Brownie Signature Date/Time: 11/10/2023/12:39:26 PM    Final    DG Chest Portable 1 View Result Date: 11/09/2023 CLINICAL DATA:  SOB EXAM: PORTABLE CHEST 1 VIEW COMPARISON:  Chest x-ray 11/09/2023 FINDINGS: Prominence of the cardiac silhouette likely due to AP portable technique and low lung volumes. The heart and mediastinal contours unchanged. Atherosclerotic plaque Low lung volumes. No focal consolidation. No pulmonary edema. No pleural effusion. No pneumothorax. No acute osseous abnormality. IMPRESSION: Low lung volumes with no active disease. Electronically Signed   By: Morgane  Naveau M.D.   On: 11/09/2023 19:21   (Echo, Carotid, EGD, Colonoscopy, ERCP)    Subjective: No  complaints  Discharge Exam: Vitals:   12/05/23 0422 12/05/23 0800  BP: (!) 176/104 (!) 189/113  Pulse: 81 92  Resp: 19 18  Temp: 98.3 F (36.8 C) 97.9 F (36.6 C)  SpO2: 100% 100%   Vitals:   12/04/23 1744 12/04/23 2057 12/05/23 0422 12/05/23 0800  BP: (!) 158/100 (!) 153/100 (!) 176/104 (!) 189/113  Pulse: 85 86 81 92  Resp:  18 19 18   Temp:  98.9 F (37.2 C) 98.3 F (36.8 C) 97.9 F (36.6 C)  TempSrc:  Oral Oral   SpO2:  100% 100% 100%  Weight:      Height:        General: Pt is alert, awake, not in acute distress Cardiovascular: RRR, S1/S2 +, no rubs, no gallops Respiratory: CTA bilaterally, no wheezing, no rhonchi Abdominal: Soft, NT, ND, bowel sounds + Extremities: no edema, no cyanosis    The results of significant diagnostics from this hospitalization (including imaging, microbiology, ancillary and laboratory) are listed below for reference.     Microbiology: No results found for this or any previous visit (from the past 240 hours).   Labs: BNP (last 3 results) No results for input(s): BNP in the last 8760 hours. Basic Metabolic Panel: Recent Labs  Lab 12/01/23 1344 12/02/23 1428 12/02/23 2020 12/04/23 0913  NA 136 136 133* 133*  K 4.1 4.3 4.6 4.3  CL 92* 91*  --  94*  CO2 24 23  --   --   GLUCOSE 238* 174*  --  90  BUN 26* 52*  --  37*  CREATININE 6.75* 10.44*  --  9.70*  CALCIUM 8.8* 8.9  --   --    Liver Function Tests: Recent Labs  Lab 12/01/23 1344 12/02/23 1428  AST 53* 52*  ALT 62* 61*  ALKPHOS 91 84  BILITOT 0.7 0.7  PROT 7.5 7.0  ALBUMIN 1.8* 1.8*  No results for input(s): LIPASE, AMYLASE in the last 168 hours. Recent Labs  Lab 12/03/23 2114  AMMONIA 33   CBC: Recent Labs  Lab 12/01/23 1344 12/02/23 1428 12/02/23 2020 12/03/23 2114 12/04/23 0913  WBC 7.0 7.2  --  7.4  --   NEUTROABS  --   --   --  5.3  --   HGB 7.2* 6.6* 6.5* 8.1* 8.8*  HCT 23.3* 21.5* 19.0* 25.4* 26.0*  MCV 86.9 86.0  --  85.2  --    PLT 420* 408*  --  359  --    Cardiac Enzymes: No results for input(s): CKTOTAL, CKMB, CKMBINDEX, TROPONINI in the last 168 hours. BNP: Invalid input(s): POCBNP CBG: Recent Labs  Lab 12/04/23 1707 12/04/23 2019 12/05/23 0013 12/05/23 0420 12/05/23 0756  GLUCAP 104* 204* 90 80 71   D-Dimer No results for input(s): DDIMER in the last 72 hours. Hgb A1c No results for input(s): HGBA1C in the last 72 hours. Lipid Profile No results for input(s): CHOL, HDL, LDLCALC, TRIG, CHOLHDL, LDLDIRECT in the last 72 hours. Thyroid function studies No results for input(s): TSH, T4TOTAL, T3FREE, THYROIDAB in the last 72 hours.  Invalid input(s): FREET3 Anemia work up No results for input(s): VITAMINB12, FOLATE, FERRITIN, TIBC, IRON , RETICCTPCT in the last 72 hours. Urinalysis    Component Value Date/Time   COLORURINE RED (A) 12/02/2023 1428   APPEARANCEUR TURBID (A) 12/02/2023 1428   LABSPEC  12/02/2023 1428    TEST NOT REPORTED DUE TO COLOR INTERFERENCE OF URINE PIGMENT   PHURINE  12/02/2023 1428    TEST NOT REPORTED DUE TO COLOR INTERFERENCE OF URINE PIGMENT   GLUCOSEU (A) 12/02/2023 1428    TEST NOT REPORTED DUE TO COLOR INTERFERENCE OF URINE PIGMENT   HGBUR (A) 12/02/2023 1428    TEST NOT REPORTED DUE TO COLOR INTERFERENCE OF URINE PIGMENT   BILIRUBINUR (A) 12/02/2023 1428    TEST NOT REPORTED DUE TO COLOR INTERFERENCE OF URINE PIGMENT   BILIRUBINUR neg 09/24/2012 1059   KETONESUR (A) 12/02/2023 1428    TEST NOT REPORTED DUE TO COLOR INTERFERENCE OF URINE PIGMENT   PROTEINUR (A) 12/02/2023 1428    TEST NOT REPORTED DUE TO COLOR INTERFERENCE OF URINE PIGMENT   UROBILINOGEN 0.2 09/24/2012 1059   UROBILINOGEN 0.2 09/11/2011 0843   NITRITE (A) 12/02/2023 1428    TEST NOT REPORTED DUE TO COLOR INTERFERENCE OF URINE PIGMENT   LEUKOCYTESUR (A) 12/02/2023 1428    TEST NOT REPORTED DUE TO COLOR INTERFERENCE OF URINE PIGMENT   Sepsis  Labs Recent Labs  Lab 12/01/23 1344 12/02/23 1428 12/03/23 2114  WBC 7.0 7.2 7.4   Microbiology No results found for this or any previous visit (from the past 240 hours).   Time coordinating discharge: Over 35 minutes  SIGNED:   Erle Odell Castor, MD  [facility] 12/05/2023, 9:06 AM Pager   If 7PM-7AM, please contact night-coverage [email or web address] Password [email or web address]

## 2023-12-05 NOTE — Plan of Care (Signed)
   Problem: Education: Goal: Knowledge of General Education information will improve Description: Including pain rating scale, medication(s)/side effects and non-pharmacologic comfort measures Outcome: Progressing   Problem: Health Behavior/Discharge Planning: Goal: Ability to manage health-related needs will improve Outcome: Progressing   Problem: Activity: Goal: Risk for activity intolerance will decrease Outcome: Progressing   Problem: Pain Managment: Goal: General experience of comfort will improve and/or be controlled Outcome: Progressing   Problem: Safety: Goal: Ability to remain free from injury will improve Outcome: Progressing

## 2023-12-06 NOTE — Progress Notes (Signed)
 Late Note Entry- December 06, 2023  Pt was d/c yesterday. Contacted [facility] this morning to be advised of pt's d/c date and that pt should resume care today.   Randine Mungo Dialysis Navigator [phone]

## 2023-12-07 ENCOUNTER — Encounter (HOSPITAL_COMMUNITY): Payer: Self-pay | Admitting: Gastroenterology

## 2023-12-07 LAB — SURGICAL PATHOLOGY

## 2023-12-15 NOTE — Progress Notes (Signed)
 Recent notes/Current Admission, reviewed with Dr Caprice. Pt attended virtual education for AOTP. No fit test on file to describe baseline, Acute PT mentioned in their notes that he was very close to baseline when they released him. There was also mention of recommending assistive devices, but the patient refused them. Citing he has his wife and 47 yr old daughter to help him at home.   Order entered for , to be completed with his work up. Pt has not been discharged as of 8:30 AM 12/15/2023. Will re-assess timeline to AOTP appt and appropriate date for upon notification he has been discharged.  Electronically signed by: Rosina LOISE Dasen, RN 12/15/2023 8:31 AM

## 2023-12-16 ENCOUNTER — Encounter (HOSPITAL_BASED_OUTPATIENT_CLINIC_OR_DEPARTMENT_OTHER): Admitting: Internal Medicine

## 2023-12-22 ENCOUNTER — Encounter (HOSPITAL_BASED_OUTPATIENT_CLINIC_OR_DEPARTMENT_OTHER): Attending: Internal Medicine | Admitting: Internal Medicine

## 2023-12-22 DIAGNOSIS — L97828 Non-pressure chronic ulcer of other part of left lower leg with other specified severity: Secondary | ICD-10-CM | POA: Insufficient documentation

## 2023-12-22 DIAGNOSIS — E1122 Type 2 diabetes mellitus with diabetic chronic kidney disease: Secondary | ICD-10-CM | POA: Insufficient documentation

## 2023-12-22 DIAGNOSIS — E11622 Type 2 diabetes mellitus with other skin ulcer: Secondary | ICD-10-CM | POA: Insufficient documentation

## 2023-12-22 DIAGNOSIS — N186 End stage renal disease: Secondary | ICD-10-CM | POA: Insufficient documentation

## 2024-01-05 ENCOUNTER — Encounter (HOSPITAL_BASED_OUTPATIENT_CLINIC_OR_DEPARTMENT_OTHER): Admitting: Internal Medicine

## 2024-01-05 DIAGNOSIS — L97828 Non-pressure chronic ulcer of other part of left lower leg with other specified severity: Secondary | ICD-10-CM | POA: Diagnosis not present

## 2024-01-05 DIAGNOSIS — N186 End stage renal disease: Secondary | ICD-10-CM

## 2024-01-05 DIAGNOSIS — E11622 Type 2 diabetes mellitus with other skin ulcer: Secondary | ICD-10-CM | POA: Diagnosis not present

## 2024-01-12 ENCOUNTER — Telehealth: Payer: Self-pay | Admitting: Physician Assistant

## 2024-01-12 ENCOUNTER — Ambulatory Visit: Payer: Self-pay

## 2024-01-12 NOTE — Telephone Encounter (Signed)
 Barry Nichols called in stating that his dialysis team informed him that he needs a blood transfusion. I looked in his chart and I did not see that he was established here at the [facility]. I informed Barry Nichols that he can go to the ED for a blood transfusion here at [facility] or [facility].

## 2024-01-12 NOTE — Telephone Encounter (Signed)
 FYI Only or Action Required?: FYI only for provider.  Called Nurse Triage reporting Results.  Triage Disposition: Call PCP Now  Patient/caregiver understands and will follow disposition?: Yes      Copied from CRM #[id number]. Topic: Clinical - Red Word Triage >> Jan 12, 2024 11:52 AM Avram MATSU wrote: Red Word that prompted transfer to Nurse Triage: HG of 5.7      Reason for Disposition  Lab or radiology calling with CRITICAL test results  Answer Assessment - Initial Assessment Questions Caller wanted to see if the patient could be seen at [facility] for [facility] transfusion. I have called the office but they don't have anything available until next week. Caller was advised of this and will reach out to the patient.    1. REASON FOR CALL or QUESTION: What is your reason for calling today? or How can I best     Critical result, HGB 5.7 2. CALLER: Document the source of call. (e.g., laboratory staff, caregiver or patient).     [place] from [facility] at [phone]  Protocols used: PCP Call - No Triage-A-AH

## 2024-01-19 ENCOUNTER — Encounter (HOSPITAL_BASED_OUTPATIENT_CLINIC_OR_DEPARTMENT_OTHER): Attending: Internal Medicine | Admitting: Internal Medicine

## 2024-01-19 DIAGNOSIS — L97828 Non-pressure chronic ulcer of other part of left lower leg with other specified severity: Secondary | ICD-10-CM | POA: Diagnosis not present

## 2024-01-19 DIAGNOSIS — E1122 Type 2 diabetes mellitus with diabetic chronic kidney disease: Secondary | ICD-10-CM | POA: Diagnosis not present

## 2024-01-19 DIAGNOSIS — E11622 Type 2 diabetes mellitus with other skin ulcer: Secondary | ICD-10-CM | POA: Diagnosis not present

## 2024-01-19 DIAGNOSIS — N186 End stage renal disease: Secondary | ICD-10-CM | POA: Diagnosis not present

## 2024-01-26 ENCOUNTER — Encounter (HOSPITAL_BASED_OUTPATIENT_CLINIC_OR_DEPARTMENT_OTHER): Admitting: Internal Medicine

## 2024-01-26 DIAGNOSIS — N186 End stage renal disease: Secondary | ICD-10-CM

## 2024-01-26 DIAGNOSIS — E11622 Type 2 diabetes mellitus with other skin ulcer: Secondary | ICD-10-CM | POA: Diagnosis not present

## 2024-01-26 DIAGNOSIS — L97828 Non-pressure chronic ulcer of other part of left lower leg with other specified severity: Secondary | ICD-10-CM

## 2024-02-01 ENCOUNTER — Other Ambulatory Visit: Payer: Self-pay | Admitting: Vascular Surgery

## 2024-02-01 DIAGNOSIS — M79606 Pain in leg, unspecified: Secondary | ICD-10-CM

## 2024-02-03 ENCOUNTER — Encounter (HOSPITAL_BASED_OUTPATIENT_CLINIC_OR_DEPARTMENT_OTHER): Admitting: Internal Medicine

## 2024-02-03 DIAGNOSIS — E11622 Type 2 diabetes mellitus with other skin ulcer: Secondary | ICD-10-CM

## 2024-02-03 DIAGNOSIS — L97828 Non-pressure chronic ulcer of other part of left lower leg with other specified severity: Secondary | ICD-10-CM | POA: Diagnosis not present

## 2024-02-10 ENCOUNTER — Encounter (HOSPITAL_BASED_OUTPATIENT_CLINIC_OR_DEPARTMENT_OTHER): Admitting: Internal Medicine

## 2024-02-10 DIAGNOSIS — E11622 Type 2 diabetes mellitus with other skin ulcer: Secondary | ICD-10-CM | POA: Diagnosis not present

## 2024-02-10 DIAGNOSIS — L97828 Non-pressure chronic ulcer of other part of left lower leg with other specified severity: Secondary | ICD-10-CM | POA: Diagnosis not present

## 2024-02-23 ENCOUNTER — Ambulatory Visit (HOSPITAL_COMMUNITY)
Admission: RE | Admit: 2024-02-23 | Discharge: 2024-02-23 | Disposition: A | Discharge status: Home / Self Care | Source: Ambulatory Visit | Attending: Vascular Surgery | Admitting: Vascular Surgery

## 2024-02-23 ENCOUNTER — Ambulatory Visit (INDEPENDENT_AMBULATORY_CARE_PROVIDER_SITE_OTHER): Admitting: Vascular Surgery

## 2024-02-23 ENCOUNTER — Encounter: Payer: Self-pay | Admitting: Vascular Surgery

## 2024-02-23 VITALS — BP 159/81 | HR 59 | Ht 70.0 in | Wt 205.0 lb

## 2024-02-23 DIAGNOSIS — I7025 Atherosclerosis of native arteries of other extremities with ulceration: Secondary | ICD-10-CM

## 2024-02-23 DIAGNOSIS — M79606 Pain in leg, unspecified: Secondary | ICD-10-CM | POA: Diagnosis present

## 2024-02-23 LAB — VAS US ABI WITH/WO TBI

## 2024-02-23 NOTE — Progress Notes (Signed)
 Patient ID: Barry Nichols, male   DOB: April 16, 1976, 48 y.o.   MRN: [id number]  Reason for Consult: No chief complaint on file.   Referred by [name], PA-C  Subjective:     HPI:  Barry Nichols is a 48 y.o. male with end-stage renal disease on dialysis via left arm access.  He is followed at the wound care center for left lower extremity pretibial and calf wounds that have been present since the end of July.  He denies any fevers or chills.  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  He is here today for evaluation of arterial flow as well as consideration of surgical debridement.  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 OR;  Service: Vascular;  Laterality: Left;   BONE BIOPSY  12/04/2023   Procedure: BIOPSY, GI;  Surgeon: Charlanne Groom, MD;  Location: [facility] ENDOSCOPY;  Service: Gastroenterology;;   DECOMPRESSION HIP-CORE Left 12/07/2022   Procedure: LEFT HIP CORE  DECOMPRESSION WITH ILIAC CREST BONE MARROW ASPIRATE;  Surgeon: Genelle Standing, MD;  Location: [facility] OR;  Service: Orthopedics;  Laterality: Left;   ENTEROSCOPY N/A 12/04/2023   Procedure: ENTEROSCOPY;  Surgeon: Charlanne Groom, MD;  Location: [facility] ENDOSCOPY;  Service: Gastroenterology;  Laterality: N/A;   ESOPHAGOGASTRODUODENOSCOPY N/A 11/11/2023   Procedure: EGD (ESOPHAGOGASTRODUODENOSCOPY);  Surgeon: Nandigam, Kavitha V, MD;  Location: [facility] ENDOSCOPY;  Service: Gastroenterology;  Laterality: N/A;   INSERTION OF DIALYSIS CATHETER Right 10/15/2016   Procedure: INSERTION OF DIALYSIS CATHETER;  Surgeon: Laurence Redell CROME, MD;  Location: [facility] OR;  Service: Vascular;  Laterality: Right;   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 60 tablet 1   cinacalcet  (SENSIPAR ) 30 MG tablet Take 30 mg by mouth daily.  Darbepoetin Alfa  (ARANESP ) 200 MCG/0.4ML SOSY injection Inject 0.4 mLs (200 mcg total) into the skin every Wednesday at 6 PM. (Patient not taking: Reported on 12/03/2023)     DM-APAP-CPM (CORICIDIN HBP PO) Take 2 capsules by mouth as needed. Gel Capsules     dorzolamide -timolol  (COSOPT ) 2-0.5 % ophthalmic solution Place 1 drop into both eyes 2 (two) times daily.     icosapent  Ethyl (VASCEPA ) 1 g capsule Take 2 g by mouth 2 (two) times daily.     insulin  degludec (TRESIBA ) 200 UNIT/ML FlexTouch Pen Inject 30 Units into the skin at bedtime as needed.     insulin  lispro (HUMALOG KWIKPEN) 200 UNIT/ML KwikPen Inject 25-40 Units into the skin 3 (three) times daily as needed (high blood sugar).     pantoprazole   (PROTONIX ) 40 MG tablet Take 1 tablet (40 mg total) by mouth 2 (two) times daily before a meal. Take twice daily x 6 weeks, then switch to once daily. 90 tablet 1   predniSONE  (DELTASONE ) 5 MG tablet Take 5 mg by mouth daily.     Pseudoeph-Doxylamine-DM-APAP (NYQUIL PO) Take 1 Dose by mouth as needed (cough). Liquid Nyquid     sulfamethoxazole -trimethoprim  (BACTRIM ) 400-80 MG tablet Take 1 tablet by mouth every Monday, Wednesday, and Friday.     tacrolimus  (PROGRAF ) 1 MG capsule Take 2-3 mg by mouth See admin instructions. Take 3 capsules (3mg ) in the morning and then take 2 capsules (2mg ) at bedtime.     tadalafil (CIALIS) 20 MG tablet Take 20 mg by mouth daily as needed for erectile dysfunction.     traZODone  (DESYREL ) 50 MG tablet Take 50 mg by mouth at bedtime.     Vitamin D, Ergocalciferol, (DRISDOL) 1.25 MG (50000 UNIT) CAPS capsule Take 50,000 Units by mouth every 7 (seven) days.     No current facility-administered medications for this visit.    Review of Systems  Constitutional:  Constitutional negative. HENT: HENT negative.  Eyes: Eyes negative.  Respiratory: Respiratory negative.  Cardiovascular: Positive for leg swelling.  GI: Gastrointestinal negative.  Musculoskeletal: Musculoskeletal negative.  Skin: Positive for wound.  Neurological: Neurological negative. Hematologic: Positive for bruises/bleeds easily.  Psychiatric: Psychiatric negative.        Objective:  Objective  Vitals:   02/23/24 1356  BP: (!) 159/81  Pulse: (!) 59  SpO2: 94%       Physical Exam HENT:     Head: Normocephalic.     Nose: Nose normal.     Mouth/Throat:     Mouth: Mucous membranes are moist.  Eyes:     Pupils: Pupils are equal, round, and reactive to light.  Cardiovascular:     Rate and Rhythm: Normal rate.  Pulmonary:     Effort: Pulmonary effort is normal.  Abdominal:     General: Abdomen is flat.  Musculoskeletal:     Right lower leg: Edema present.     Left lower leg:  Edema present.  Neurological:     General: No focal deficit present.     Mental Status: He is alert.    Left pretibial wound  Data: ABI Findings:  +---------+------------------+-----+---------+--------+  Right   Rt Pressure (mmHg)IndexWaveform Comment   +---------+------------------+-----+---------+--------+  Brachial 156                                       +---------+------------------+-----+---------+--------+  PTA     254  1.63 triphasic          +---------+------------------+-----+---------+--------+  DP      254               1.63 biphasic           +---------+------------------+-----+---------+--------+  Great Toe76                0.49 Abnormal           +---------+------------------+-----+---------+--------+   +---------+------------------+-----+---------+-------+  Left    Lt Pressure (mmHg)IndexWaveform Comment  +---------+------------------+-----+---------+-------+  Brachial                                 AVF      +---------+------------------+-----+---------+-------+  PTA     254               1.63 triphasic         +---------+------------------+-----+---------+-------+  DP      254               1.63 triphasic         +---------+------------------+-----+---------+-------+  Great Toe160               1.03 Normal            +---------+------------------+-----+---------+-------+   +-------+-----------+-----------+------------+------------+  ABI/TBIToday's ABIToday's TBIPrevious ABIPrevious TBI  +-------+-----------+-----------+------------+------------+  Right [place]         0.49       1.29        0.89          +-------+-----------+-----------+------------+------------+  Left  [place]         1.03       1.26        0.92          +-------+-----------+-----------+------------+------------+         Bilateral ABIs now appear non -compressible Right toe-brachial index has  decreased  since prior exam of 10/30/2019    Summary:  Right: Resting right ankle-brachial index indicates noncompressible right  lower extremity arteries. The right toe-brachial index is abnormal.    Left: Resting left ankle-brachial index indicates noncompressible left  lower extremity arteries. The left toe-brachial index is normal.         Assessment/Plan:    48 year old male with 63-month history of left pretibial and posterior leg wounds followed by the wound care center.  He is sent today for evaluation of arterial flow and consideration of surgical debridement.  At this time patient does not want to consider surgery given that he has calciphylaxis and is worried about healing which I certainly think is reasonable.  Given that his arterial flow is satisfactory I would recommend referral to Dr. Harden the patient wishes to proceed with surgical debridement in the future.  All questions were answered he demonstrates good understanding.     Penne Lonni [place] MD [facility]

## 2024-02-24 ENCOUNTER — Encounter (HOSPITAL_BASED_OUTPATIENT_CLINIC_OR_DEPARTMENT_OTHER): Attending: Internal Medicine | Admitting: Internal Medicine

## 2024-02-24 DIAGNOSIS — N186 End stage renal disease: Secondary | ICD-10-CM | POA: Insufficient documentation

## 2024-02-24 DIAGNOSIS — E11622 Type 2 diabetes mellitus with other skin ulcer: Secondary | ICD-10-CM | POA: Insufficient documentation

## 2024-02-24 DIAGNOSIS — E1122 Type 2 diabetes mellitus with diabetic chronic kidney disease: Secondary | ICD-10-CM | POA: Diagnosis not present

## 2024-02-24 DIAGNOSIS — L97828 Non-pressure chronic ulcer of other part of left lower leg with other specified severity: Secondary | ICD-10-CM | POA: Diagnosis not present

## 2024-03-09 ENCOUNTER — Encounter (HOSPITAL_BASED_OUTPATIENT_CLINIC_OR_DEPARTMENT_OTHER): Admitting: Internal Medicine

## 2024-03-09 DIAGNOSIS — E11622 Type 2 diabetes mellitus with other skin ulcer: Secondary | ICD-10-CM | POA: Diagnosis not present

## 2024-03-09 DIAGNOSIS — L97828 Non-pressure chronic ulcer of other part of left lower leg with other specified severity: Secondary | ICD-10-CM | POA: Diagnosis not present

## 2024-03-23 ENCOUNTER — Encounter (HOSPITAL_BASED_OUTPATIENT_CLINIC_OR_DEPARTMENT_OTHER): Admitting: Internal Medicine

## 2024-03-23 DIAGNOSIS — E1122 Type 2 diabetes mellitus with diabetic chronic kidney disease: Secondary | ICD-10-CM | POA: Diagnosis not present

## 2024-03-23 DIAGNOSIS — I120 Hypertensive chronic kidney disease with stage 5 chronic kidney disease or end stage renal disease: Secondary | ICD-10-CM | POA: Diagnosis not present

## 2024-03-23 DIAGNOSIS — Z992 Dependence on renal dialysis: Secondary | ICD-10-CM | POA: Diagnosis not present

## 2024-03-23 DIAGNOSIS — N186 End stage renal disease: Secondary | ICD-10-CM | POA: Diagnosis not present

## 2024-03-23 DIAGNOSIS — L97828 Non-pressure chronic ulcer of other part of left lower leg with other specified severity: Secondary | ICD-10-CM | POA: Insufficient documentation

## 2024-03-23 DIAGNOSIS — Z87891 Personal history of nicotine dependence: Secondary | ICD-10-CM | POA: Insufficient documentation

## 2024-03-23 DIAGNOSIS — E11622 Type 2 diabetes mellitus with other skin ulcer: Secondary | ICD-10-CM | POA: Diagnosis not present

## 2024-04-06 ENCOUNTER — Encounter (HOSPITAL_BASED_OUTPATIENT_CLINIC_OR_DEPARTMENT_OTHER): Admitting: Internal Medicine

## 2024-04-06 DIAGNOSIS — E11622 Type 2 diabetes mellitus with other skin ulcer: Secondary | ICD-10-CM | POA: Diagnosis not present

## 2024-04-06 DIAGNOSIS — L97828 Non-pressure chronic ulcer of other part of left lower leg with other specified severity: Secondary | ICD-10-CM | POA: Diagnosis not present

## 2024-05-04 ENCOUNTER — Encounter (HOSPITAL_BASED_OUTPATIENT_CLINIC_OR_DEPARTMENT_OTHER): Attending: Internal Medicine | Admitting: Internal Medicine

## 2024-05-04 DIAGNOSIS — Z09 Encounter for follow-up examination after completed treatment for conditions other than malignant neoplasm: Secondary | ICD-10-CM | POA: Insufficient documentation

## 2024-05-04 DIAGNOSIS — N186 End stage renal disease: Secondary | ICD-10-CM | POA: Diagnosis not present

## 2024-05-04 DIAGNOSIS — E11622 Type 2 diabetes mellitus with other skin ulcer: Secondary | ICD-10-CM | POA: Diagnosis not present

## 2024-05-04 DIAGNOSIS — E1122 Type 2 diabetes mellitus with diabetic chronic kidney disease: Secondary | ICD-10-CM | POA: Insufficient documentation

## 2024-05-04 DIAGNOSIS — L97828 Non-pressure chronic ulcer of other part of left lower leg with other specified severity: Secondary | ICD-10-CM | POA: Insufficient documentation

## 2024-05-27 ENCOUNTER — Ambulatory Visit (HOSPITAL_BASED_OUTPATIENT_CLINIC_OR_DEPARTMENT_OTHER): Admitting: Physical Therapy
